# Patient Record
Sex: Female | Born: 1980 | Race: Black or African American | Hispanic: No | Marital: Single | Smoking: Never smoker
Health system: Southern US, Community
[De-identification: ages and names within clinical notes are randomized; demographics above are authoritative.]

## PROBLEM LIST (undated history)

## (undated) DIAGNOSIS — G809 Cerebral palsy, unspecified: Secondary | ICD-10-CM

## (undated) DIAGNOSIS — T420X1A Poisoning by hydantoin derivatives, accidental (unintentional), initial encounter: Secondary | ICD-10-CM

## (undated) DIAGNOSIS — I10 Essential (primary) hypertension: Principal | ICD-10-CM

## (undated) DIAGNOSIS — F79 Unspecified intellectual disabilities: Secondary | ICD-10-CM

## (undated) DIAGNOSIS — H47619 Cortical blindness, unspecified side of brain: Secondary | ICD-10-CM

## (undated) DIAGNOSIS — R569 Unspecified convulsions: Secondary | ICD-10-CM

## (undated) DIAGNOSIS — I509 Heart failure, unspecified: Secondary | ICD-10-CM

## (undated) DIAGNOSIS — J189 Pneumonia, unspecified organism: Secondary | ICD-10-CM

## (undated) DIAGNOSIS — G808 Other cerebral palsy: Secondary | ICD-10-CM

## (undated) DIAGNOSIS — H539 Unspecified visual disturbance: Secondary | ICD-10-CM

## (undated) DIAGNOSIS — J449 Chronic obstructive pulmonary disease, unspecified: Secondary | ICD-10-CM

## (undated) DIAGNOSIS — J309 Allergic rhinitis, unspecified: Secondary | ICD-10-CM

## (undated) HISTORY — DX: Unspecified convulsions: R56.9

## (undated) HISTORY — DX: Unspecified visual disturbance: H53.9

## (undated) HISTORY — DX: Pneumonia, unspecified organism: J18.9

## (undated) HISTORY — DX: Cerebral palsy, unspecified: G80.9

## (undated) HISTORY — PX: PEG TUBE PLACEMENT: SUR1034

## (undated) HISTORY — DX: Other cerebral palsy: G80.8

## (undated) HISTORY — DX: Poisoning by hydantoin derivatives, accidental (unintentional), initial encounter: T42.0X1A

## (undated) HISTORY — DX: Unspecified intellectual disabilities: F79

## (undated) HISTORY — DX: Allergic rhinitis, unspecified: J30.9

## (undated) HISTORY — DX: Essential (primary) hypertension: I10

## (undated) HISTORY — DX: Cortical blindness, unspecified side of brain: H47.619

---
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
## 1999-09-07 ENCOUNTER — Emergency Department (HOSPITAL_COMMUNITY): Admission: EM | Admit: 1999-09-07 | Discharge: 1999-09-07 | Payer: Self-pay | Admitting: Emergency Medicine

## 1999-09-07 ENCOUNTER — Encounter: Payer: Self-pay | Admitting: Emergency Medicine

## 1999-09-08 ENCOUNTER — Encounter: Payer: Self-pay | Admitting: Emergency Medicine

## 1999-09-08 ENCOUNTER — Inpatient Hospital Stay (HOSPITAL_COMMUNITY): Admission: EM | Admit: 1999-09-08 | Discharge: 1999-09-16 | Payer: Self-pay | Admitting: Emergency Medicine

## 1999-09-11 ENCOUNTER — Encounter: Payer: Self-pay | Admitting: Pediatrics

## 1999-09-25 ENCOUNTER — Emergency Department (HOSPITAL_COMMUNITY): Admission: EM | Admit: 1999-09-25 | Discharge: 1999-09-25 | Payer: Self-pay | Admitting: Emergency Medicine

## 1999-09-25 ENCOUNTER — Encounter: Payer: Self-pay | Admitting: Emergency Medicine

## 1999-09-26 ENCOUNTER — Inpatient Hospital Stay (HOSPITAL_COMMUNITY): Admission: AD | Admit: 1999-09-26 | Discharge: 1999-09-28 | Payer: Self-pay | Admitting: Pediatrics

## 1999-09-26 ENCOUNTER — Encounter: Payer: Self-pay | Admitting: Pediatrics

## 1999-10-03 ENCOUNTER — Inpatient Hospital Stay (HOSPITAL_COMMUNITY): Admission: EM | Admit: 1999-10-03 | Discharge: 1999-10-14 | Payer: Self-pay | Admitting: Pediatrics

## 1999-10-04 ENCOUNTER — Encounter: Payer: Self-pay | Admitting: Pediatrics

## 1999-10-06 ENCOUNTER — Encounter: Payer: Self-pay | Admitting: Pediatrics

## 1999-10-07 ENCOUNTER — Encounter: Payer: Self-pay | Admitting: Pediatrics

## 1999-10-09 ENCOUNTER — Encounter: Payer: Self-pay | Admitting: Pediatrics

## 1999-12-26 ENCOUNTER — Ambulatory Visit (HOSPITAL_COMMUNITY): Admission: RE | Admit: 1999-12-26 | Discharge: 1999-12-26 | Payer: Self-pay | Admitting: Surgery

## 2000-01-01 ENCOUNTER — Encounter: Payer: Self-pay | Admitting: Emergency Medicine

## 2000-01-01 ENCOUNTER — Observation Stay (HOSPITAL_COMMUNITY): Admission: EM | Admit: 2000-01-01 | Discharge: 2000-01-02 | Payer: Self-pay | Admitting: Emergency Medicine

## 2000-11-26 ENCOUNTER — Ambulatory Visit (HOSPITAL_COMMUNITY): Admission: RE | Admit: 2000-11-26 | Discharge: 2000-11-26 | Payer: Self-pay | Admitting: Surgery

## 2001-08-03 ENCOUNTER — Ambulatory Visit (HOSPITAL_COMMUNITY): Admission: RE | Admit: 2001-08-03 | Discharge: 2001-08-03 | Payer: Self-pay | Admitting: Surgery

## 2001-12-07 ENCOUNTER — Encounter: Payer: Self-pay | Admitting: Emergency Medicine

## 2001-12-07 ENCOUNTER — Inpatient Hospital Stay (HOSPITAL_COMMUNITY): Admission: EM | Admit: 2001-12-07 | Discharge: 2001-12-12 | Payer: Self-pay | Admitting: Emergency Medicine

## 2001-12-08 ENCOUNTER — Encounter: Payer: Self-pay | Admitting: Emergency Medicine

## 2002-10-16 ENCOUNTER — Emergency Department (HOSPITAL_COMMUNITY): Admission: EM | Admit: 2002-10-16 | Discharge: 2002-10-16 | Payer: Self-pay | Admitting: Emergency Medicine

## 2002-10-24 ENCOUNTER — Emergency Department (HOSPITAL_COMMUNITY): Admission: EM | Admit: 2002-10-24 | Discharge: 2002-10-24 | Payer: Self-pay | Admitting: Emergency Medicine

## 2002-10-24 ENCOUNTER — Encounter: Payer: Self-pay | Admitting: Emergency Medicine

## 2003-02-20 ENCOUNTER — Ambulatory Visit (HOSPITAL_COMMUNITY): Admission: RE | Admit: 2003-02-20 | Discharge: 2003-02-20 | Payer: Self-pay | Admitting: General Surgery

## 2003-05-01 ENCOUNTER — Ambulatory Visit: Admission: RE | Admit: 2003-05-01 | Discharge: 2003-05-01 | Payer: Self-pay | Admitting: Pediatrics

## 2003-05-15 ENCOUNTER — Ambulatory Visit (HOSPITAL_COMMUNITY): Admission: RE | Admit: 2003-05-15 | Discharge: 2003-05-15 | Payer: Self-pay | Admitting: Pediatrics

## 2003-07-18 ENCOUNTER — Inpatient Hospital Stay (HOSPITAL_COMMUNITY): Admission: EM | Admit: 2003-07-18 | Discharge: 2003-07-20 | Payer: Self-pay | Admitting: Emergency Medicine

## 2003-11-05 ENCOUNTER — Emergency Department (HOSPITAL_COMMUNITY): Admission: EM | Admit: 2003-11-05 | Discharge: 2003-11-05 | Payer: Self-pay | Admitting: Emergency Medicine

## 2003-11-06 ENCOUNTER — Inpatient Hospital Stay (HOSPITAL_COMMUNITY): Admission: EM | Admit: 2003-11-06 | Discharge: 2003-11-08 | Payer: Self-pay | Admitting: Emergency Medicine

## 2004-05-14 ENCOUNTER — Emergency Department (HOSPITAL_COMMUNITY): Admission: EM | Admit: 2004-05-14 | Discharge: 2004-05-14 | Payer: Self-pay | Admitting: Emergency Medicine

## 2004-11-24 ENCOUNTER — Emergency Department (HOSPITAL_COMMUNITY): Admission: EM | Admit: 2004-11-24 | Discharge: 2004-11-24 | Payer: Self-pay | Admitting: Family Medicine

## 2005-07-09 ENCOUNTER — Emergency Department (HOSPITAL_COMMUNITY): Admission: EM | Admit: 2005-07-09 | Discharge: 2005-07-09 | Payer: Self-pay | Admitting: Emergency Medicine

## 2005-12-21 ENCOUNTER — Ambulatory Visit: Payer: Self-pay | Admitting: Surgery

## 2005-12-21 ENCOUNTER — Ambulatory Visit (HOSPITAL_COMMUNITY): Admission: RE | Admit: 2005-12-21 | Discharge: 2005-12-21 | Payer: Self-pay | Admitting: Surgery

## 2006-04-01 ENCOUNTER — Ambulatory Visit: Payer: Self-pay | Admitting: Surgery

## 2006-04-12 ENCOUNTER — Encounter: Admission: RE | Admit: 2006-04-12 | Discharge: 2006-04-13 | Payer: Self-pay | Admitting: Pediatrics

## 2007-01-11 ENCOUNTER — Ambulatory Visit: Payer: Self-pay | Admitting: General Surgery

## 2007-05-02 ENCOUNTER — Inpatient Hospital Stay (HOSPITAL_COMMUNITY): Admission: EM | Admit: 2007-05-02 | Discharge: 2007-05-05 | Payer: Self-pay | Admitting: Emergency Medicine

## 2007-08-18 ENCOUNTER — Inpatient Hospital Stay (HOSPITAL_COMMUNITY): Admission: EM | Admit: 2007-08-18 | Discharge: 2007-08-19 | Payer: Self-pay | Admitting: Emergency Medicine

## 2007-08-23 ENCOUNTER — Emergency Department (HOSPITAL_COMMUNITY): Admission: EM | Admit: 2007-08-23 | Discharge: 2007-08-24 | Payer: Self-pay | Admitting: Emergency Medicine

## 2007-08-29 ENCOUNTER — Inpatient Hospital Stay (HOSPITAL_COMMUNITY): Admission: EM | Admit: 2007-08-29 | Discharge: 2007-09-22 | Payer: Self-pay | Admitting: Emergency Medicine

## 2007-08-29 ENCOUNTER — Ambulatory Visit: Payer: Self-pay | Admitting: Pulmonary Disease

## 2007-09-01 ENCOUNTER — Encounter (INDEPENDENT_AMBULATORY_CARE_PROVIDER_SITE_OTHER): Payer: Self-pay | Admitting: Neurology

## 2007-09-01 ENCOUNTER — Ambulatory Visit: Payer: Self-pay | Admitting: Vascular Surgery

## 2007-11-27 ENCOUNTER — Emergency Department (HOSPITAL_COMMUNITY): Admission: EM | Admit: 2007-11-27 | Discharge: 2007-11-27 | Payer: Self-pay | Admitting: Emergency Medicine

## 2007-12-04 ENCOUNTER — Emergency Department (HOSPITAL_COMMUNITY): Admission: EM | Admit: 2007-12-04 | Discharge: 2007-12-04 | Payer: Self-pay | Admitting: Emergency Medicine

## 2007-12-24 ENCOUNTER — Emergency Department (HOSPITAL_COMMUNITY): Admission: EM | Admit: 2007-12-24 | Discharge: 2007-12-25 | Payer: Self-pay | Admitting: Emergency Medicine

## 2008-01-17 ENCOUNTER — Ambulatory Visit: Payer: Self-pay | Admitting: Infectious Diseases

## 2008-01-17 ENCOUNTER — Inpatient Hospital Stay (HOSPITAL_COMMUNITY): Admission: EM | Admit: 2008-01-17 | Discharge: 2008-01-21 | Payer: Self-pay | Admitting: Emergency Medicine

## 2008-01-30 ENCOUNTER — Inpatient Hospital Stay (HOSPITAL_COMMUNITY): Admission: EM | Admit: 2008-01-30 | Discharge: 2008-02-13 | Payer: Self-pay | Admitting: Emergency Medicine

## 2008-02-03 ENCOUNTER — Ambulatory Visit: Payer: Self-pay | Admitting: Gastroenterology

## 2008-02-06 ENCOUNTER — Encounter (INDEPENDENT_AMBULATORY_CARE_PROVIDER_SITE_OTHER): Payer: Self-pay | Admitting: Neurology

## 2008-02-06 ENCOUNTER — Ambulatory Visit: Payer: Self-pay | Admitting: *Deleted

## 2008-02-16 ENCOUNTER — Inpatient Hospital Stay (HOSPITAL_COMMUNITY): Admission: AD | Admit: 2008-02-16 | Discharge: 2008-02-23 | Payer: Self-pay | Admitting: Pediatrics

## 2009-07-18 ENCOUNTER — Encounter: Payer: Self-pay | Admitting: Family Medicine

## 2009-07-18 ENCOUNTER — Ambulatory Visit: Payer: Self-pay | Admitting: Family Medicine

## 2009-07-23 ENCOUNTER — Encounter: Payer: Self-pay | Admitting: Family Medicine

## 2009-07-23 LAB — CONVERTED CEMR LAB
AST: 41 units/L — ABNORMAL HIGH (ref 0–37)
Albumin: 3.9 g/dL (ref 3.5–5.2)
BUN: 11 mg/dL (ref 6–23)
Calcium: 9.1 mg/dL (ref 8.4–10.5)
Chloride: 99 meq/L (ref 96–112)
Potassium: 4.4 meq/L (ref 3.5–5.3)

## 2009-10-08 ENCOUNTER — Ambulatory Visit: Payer: Self-pay | Admitting: Family Medicine

## 2009-10-14 ENCOUNTER — Observation Stay (HOSPITAL_COMMUNITY): Admission: EM | Admit: 2009-10-14 | Discharge: 2009-10-16 | Payer: Self-pay | Admitting: Emergency Medicine

## 2009-10-14 ENCOUNTER — Encounter: Payer: Self-pay | Admitting: Family Medicine

## 2009-10-14 ENCOUNTER — Ambulatory Visit: Payer: Self-pay | Admitting: Family Medicine

## 2009-10-22 ENCOUNTER — Ambulatory Visit: Payer: Self-pay | Admitting: Family Medicine

## 2010-01-01 ENCOUNTER — Emergency Department (HOSPITAL_COMMUNITY): Admission: EM | Admit: 2010-01-01 | Discharge: 2010-01-01 | Payer: Self-pay | Admitting: Family Medicine

## 2010-01-23 ENCOUNTER — Telehealth: Payer: Self-pay | Admitting: *Deleted

## 2010-01-25 ENCOUNTER — Ambulatory Visit: Payer: Self-pay | Admitting: Family Medicine

## 2010-01-25 ENCOUNTER — Inpatient Hospital Stay (HOSPITAL_COMMUNITY): Admission: EM | Admit: 2010-01-25 | Discharge: 2010-01-28 | Payer: Self-pay | Admitting: Emergency Medicine

## 2010-01-25 ENCOUNTER — Encounter: Payer: Self-pay | Admitting: Family Medicine

## 2010-02-21 ENCOUNTER — Ambulatory Visit: Payer: Self-pay | Admitting: Family Medicine

## 2010-02-21 ENCOUNTER — Telehealth: Payer: Self-pay | Admitting: Family Medicine

## 2010-02-21 DIAGNOSIS — B350 Tinea barbae and tinea capitis: Secondary | ICD-10-CM

## 2010-02-21 DIAGNOSIS — G809 Cerebral palsy, unspecified: Secondary | ICD-10-CM

## 2010-02-21 DIAGNOSIS — H543 Unqualified visual loss, both eyes: Secondary | ICD-10-CM | POA: Insufficient documentation

## 2010-02-24 ENCOUNTER — Telehealth: Payer: Self-pay | Admitting: Family Medicine

## 2010-03-23 ENCOUNTER — Emergency Department (HOSPITAL_COMMUNITY): Admission: EM | Admit: 2010-03-23 | Discharge: 2010-03-23 | Payer: Self-pay | Admitting: Family Medicine

## 2010-03-24 ENCOUNTER — Ambulatory Visit: Payer: Self-pay | Admitting: Family Medicine

## 2010-03-24 DIAGNOSIS — B09 Unspecified viral infection characterized by skin and mucous membrane lesions: Secondary | ICD-10-CM | POA: Insufficient documentation

## 2010-03-24 DIAGNOSIS — J069 Acute upper respiratory infection, unspecified: Secondary | ICD-10-CM | POA: Insufficient documentation

## 2010-03-27 ENCOUNTER — Ambulatory Visit: Payer: Self-pay | Admitting: Family Medicine

## 2010-04-01 ENCOUNTER — Ambulatory Visit: Payer: Self-pay | Admitting: Family Medicine

## 2010-04-01 DIAGNOSIS — T148XXA Other injury of unspecified body region, initial encounter: Secondary | ICD-10-CM

## 2010-04-07 ENCOUNTER — Ambulatory Visit: Payer: Self-pay | Admitting: Family Medicine

## 2010-05-19 ENCOUNTER — Ambulatory Visit: Admission: RE | Admit: 2010-05-19 | Discharge: 2010-05-19 | Payer: Self-pay | Source: Home / Self Care

## 2010-05-22 ENCOUNTER — Ambulatory Visit
Admission: RE | Admit: 2010-05-22 | Discharge: 2010-05-22 | Payer: Self-pay | Source: Home / Self Care | Attending: Family Medicine | Admitting: Family Medicine

## 2010-05-22 ENCOUNTER — Telehealth: Payer: Self-pay | Admitting: *Deleted

## 2010-05-22 DIAGNOSIS — R609 Edema, unspecified: Secondary | ICD-10-CM | POA: Insufficient documentation

## 2010-05-23 ENCOUNTER — Telehealth: Payer: Self-pay | Admitting: Family Medicine

## 2010-05-27 NOTE — Assessment & Plan Note (Signed)
Summary: severe congestion/eo   Vital Signs:  Patient profile:   30 year old female Temp:     97.5 degrees F axillary Pulse rate:   89 / minute BP sitting:   137 / 94  (right arm) Cuff size:   regular  Vitals Entered By: Tessie Fass CMA (October 08, 2009 11:02 AM) CC: congestion x few days. Is Patient Diabetic? No Pain Assessment Patient in pain? no        CC:  congestion x few days.Marland Kitchen  History of Present Illness: Megan [name] comes in with her aunt today for productive cough and fever for 3-4 days.  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  She is fed through a G-tube.  Did not measure temp yesterday but felt very warm and they gave her tylenol.  Coughing up white to clear sputum.  Has gotten pneumonia before and got so bad she ended up in the hospital and almost died.  No vomitting.  NO diarrhea.  No nasal congestion or watery eyes.   Habits & Providers  Alcohol-Tobacco-Diet     Passive Smoke Exposure: no  Social History: Passive Smoke Exposure:  no  Physical Exam  General:  comfortable in wheelchair Eyes:  conjunctiva clear Lungs:  difficult exam as patient unable to fully cooperate.  Somewhat decreased throughout.  No discrete crackles or wheezes appreciated.  Heart:  RRR   Impression & Recommendations:  Problem # 1:  COUGH (ICD-786.2) Assessment New  Difficult patient to examine.  She is at risk for pneumonia with her comorbidities and at risk for aspiration even with g-tube.  Given patient is in fragile health and very unlikely to get good, reliable xray will empirically treat a pneumonia with 7 day course of avelox which would cover usual respiratory pathogens and anaerobes in case of aspiration.  Return if worsens or not improved at end of 7 days course.   Orders: [facility]- Est Level  3 (78469)  Complete Medication List: 1)  Valproic Acid 250 Mg/38ml Syrp (Valproate sodium) .Marland KitchenMarland KitchenMarland Kitchen 13ml by mouth q 6 hours disp: qs 39month 2)  Ranitidine  Hcl 15 Mg/ml Syrp (Ranitidine hcl) .Marland Kitchen.. 1 tsp two times a day disp: qs 1 month 3)  Dilantin 125 Mg/55ml Susp (Phenytoin) .... 4ml by mouth three times a day  disp: qs 1 month 4)  Primidone 250 Mg Tabs (Primidone) .... 1/2 tab crushed and put through tube two times a day 5)  Avelox 400 Mg Tabs (Moxifloxacin hcl) .Marland Kitchen.. 1 tab by mouth daily for 7 days Prescriptions: AVELOX 400 MG TABS (MOXIFLOXACIN HCL) 1 tab by mouth daily for 7 days  #7 x 0   Entered and Authorized by:   Ardeen Garland  MD   Signed by:   Ardeen Garland  MD on 10/08/2009   Method used:   Faxed to ...       [facility] (retail)       [address]       [place], [place]  [address]       Ph: [phone]       Fax: [phone]   RxID:   [id number]
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
## 2010-05-27 NOTE — Progress Notes (Signed)
Summary: Triage call  Phone Note Other Incoming Call back at [phone]   Summary of Call: Ms. Megan [name] aunt need her to be seen in the afternoon because she attends school.  She has some scabs  on her scalp that the aunt was treating, but she has run out of the meds and [name] is having pus coming from them.   Her aunt's name is Megan [name].  Would prefer to come in tomorrow afternoon after 3:00. Initial call taken by: Abundio Miu,  January 23, 2010 8:49 AM  Follow-up for Phone Call        Appt made with Dr. Janalyn Harder for Monday pm. Follow-up by: Dennison Nancy RN,  January 23, 2010 10:54 AM
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
## 2010-05-27 NOTE — Assessment & Plan Note (Signed)
Summary: np,df   Vital Signs:  Patient profile:   30 year old female Temp:     97.4 degrees F oral Pulse rate:   93 / minute BP sitting:   110 / 70  (right arm) Cuff size:   regular  Vitals Entered By: Tessie Fass CMA (July 18, 2009 4:05 PM) CC: new pt   CC:  new pt.  History of Present Illness: Megan [name] comes in with her grandmother today to establish care.  Has not really seen a doctor since admitted to the hospital in December 2009. She does have a complicated medical history, updated in [facility].  Primary problems are neurologic, however, and she is otherwise healthy.  No complaints today.  Just want to establish care.  They were following with Dr. Sharene Skeans but have not seen him since that 2009 admission, though they have been getting refills of her seizure medications.  No recent labwork obtained per grandmother.    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  Her mother is not involved in her care.   Grandmother's name is [name].  She attends [employer].   Physical Exam  General:  in wheelchair.  Nonverbal.  Comfortable.  NAD vitals reviewed Eyes:  Strabismus Lungs:  normal WOB, CTAB Heart:  RRR without murmur Abdomen:  PEG in palce.  Site without redness, swelling, or discharge.  Msk:  contractures of extremities Neurologic:  unable to assess - cannot cooperate wheelchair bound   Impression & Recommendations:  Problem # 1:  ENCOUNTER FOR LONG-TERM USE OF OTHER MEDICATIONS (ICD-V58.69) Assessment New Other than seizure disorder and associated comorbidities, patient otherwise healthy.  No HTN, hLD, DM, CAD, etc.  Will check levels of her  antiepileptic meds today.  Grandmother will re-establish with Dr. Sharene Skeans.  If she needs a referral, she will let us know.  Benign exam today.  Orders: Comp Met-[facility] [phone]) Dilantin-[facility] [phone]) Valproic Acid-[facility] [id number])  Complete Medication List: 1)  Valproic Acid 250 Mg/52ml Syrp (Valproate sodium) .Marland KitchenMarland KitchenMarland Kitchen 13ml by mouth q 6 hours disp: qs 66month 2)  Ranitidine Hcl 15 Mg/ml Syrp (Ranitidine hcl) .Marland Kitchen.. 1 tsp two times a day disp: qs 1 month 3)  Dilantin 125 Mg/36ml Susp (Phenytoin) .... 4ml by mouth three times a day  disp: qs 1 month 4)  Primidone 250 Mg Tabs (Primidone) .... 1/2 tab crushed and put through tube two times a day

## 2010-05-27 NOTE — Miscellaneous (Signed)
Summary: Orders Update: Future lab orders  Clinical Lists Changes  Orders: Added new Test order of CBC-[facility] ([id number]) - Signed Added new Test order of Comp Met-[facility] [phone]) - Signed Added new Test order of Dilantin-[facility] [phone]) - Signed Added new Test order of Ammonia-[facility] [phone]) - Signed Added new Test order of Valproic Acid-[facility] ([id number]) - Signed

## 2010-05-27 NOTE — Progress Notes (Signed)
Summary: Lamisil dose verification  [facility] pharmacy called questioning the dosage Dr. Fara Boros prescribed on pts Lamisil.  Paged to Dr. Fara Boros. Per Dr. Fara Boros change Rx for Lamisil 250mg . Crush one tablet in G-tube one time a day.  Dispense #75 with no refills.  [facility] pharmacy notified [phone].  Terese Door  February 24, 2010 11:49 AM

## 2010-05-27 NOTE — Assessment & Plan Note (Signed)
Summary: not any better,df   Vital Signs:  Patient profile:   30 year old female O2 Sat:      95 % Temp:     97.9 degrees F oral Pulse rate:   80 / minute BP sitting:   110 / 80  (right arm)  Vitals Entered By: Arlyss Repress CMA, (March 27, 2010 10:52 AM) CC: f/up cough and congestion. not any better. worse during night. Is Patient Diabetic? No Pain Assessment Patient in pain? no        Primary Care Provider:  Demetria Pore MD  CC:  f/up cough and congestion. not any better. worse during night..  History of Present Illness: Patient with Cerebral Palsy and cognitive impairment, cared for by aunt Leanne who is the historian today. Tamani was seen here last on 11/28 for cough which has been present since her discharge from the hospital in early October.  Review of E-chart and CT angio of chest done on Jan 25, 2010 shows R lower lung PNA, also seen on CXR of same date.  No PE seen; CT abd/pelvis showed stool only.   Since last visit, Ivis continues with excessive cough.  Leanne notes that Shirleyann's habitus makes it difficult for her to generate the pressure needed to clear secretions.  Coughs more at night; has had some secretions which are clear.  No fevers since last visit, although had 104F at time of last admission.  Completed the Avelox then, did seem better at home after discharge.   No known allergies to meds.  Kacy's grandmother (Leanne's mother) lives with the family, has a bacterial sinusitis and was just started on abx for this.    Habits & Providers  Alcohol-Tobacco-Diet     Passive Smoke Exposure: no  Current Medications (verified): 1)  Valproic Acid 250 Mg/19ml Syrp (Valproate Sodium) .Marland KitchenMarland KitchenMarland Kitchen 13ml By Mouth Q 6 Hours Disp: Qs 10month 2)  Ranitidine Hcl 15 Mg/ml Syrp (Ranitidine Hcl) .Marland Kitchen.. 1 Tsp Two Times A Day Disp: Qs 1 Month 3)  Dilantin 125 Mg/43ml Susp (Phenytoin) .... 4ml By Mouth Three Times A Day  Disp: Qs 1 Month 4)  Primidone 250 Mg Tabs (Primidone) .... 1/2 Tab  Crushed and Put Through Tube Two Times A Day 5)  Terbinafine Hcl 250 Mg Tabs (Terbinafine Hcl) .... Crush 1 Tab and Give Per G-Tube Daily For 4 Weeks After All Lesions Have Cleared 6)  Doxycycline Hyclate 100 Mg Caps (Doxycycline Hyclate) .... Sig Give Contents of 1 Capsule in Peg Tube, Twice Daily For 10 Days  Allergies (verified): No Known Drug Allergies  Physical Exam  General:  Wheelchair bound; appears comfortable.  Sleeping, not easily arousable.  No accessory muscle use with breathing. Ears:  TMs viewed, clear bilaterally Nose:  No rhinorrhea noted Mouth:  Moist mucus membranes Neck:  Neck supple.  Lungs:  Decreased respiratory effort. Lungs are clear to auscultation, no crackles or wheezes.  Heart:  Normal rate and regular rhythm. S1 and S2 normal without gallop, murmur, click, rub or other extra sounds.   Impression & Recommendations:  Problem # 1:  COUGH (ICD-786.2)  Patient with recent admission for pneumonia (Oct 2011); appeared improved, now back with persistent and nonresolving cough that has been plateaued for a few weeks.  Patient has risk of poor mobility and inability to generate valsalva needed to clear secretions.  No fevers, Pulse Ox 95% and appears comfortable now.  Will give empiric treatment with doxy 100mg  capsules, contents of capsule per G tube  twice daily, for 10 days.  For repeat visit in the coming week, or sooner if worsens/fever.  Would consider followup CXR if not improving, certainly if worsening.   Orders: [facility]- Est Level  3 ([address])  Complete Medication List: 1)  Valproic Acid 250 Mg/85ml Syrp (Valproate sodium) .Marland KitchenMarland KitchenMarland Kitchen 13ml by mouth q 6 hours disp: qs 37month 2)  Ranitidine Hcl 15 Mg/ml Syrp (Ranitidine hcl) .Marland Kitchen.. 1 tsp two times a day disp: qs 1 month 3)  Dilantin 125 Mg/44ml Susp (Phenytoin) .... 4ml by mouth three times a day  disp: qs 1 month 4)  Primidone 250 Mg Tabs (Primidone) .... 1/2 tab crushed and put through tube two times a day 5)  Terbinafine  Hcl 250 Mg Tabs (Terbinafine hcl) .... Crush 1 tab and give per g-tube daily for 4 weeks after all lesions have cleared 6)  Doxycycline Hyclate 100 Mg Caps (Doxycycline hyclate) .... Sig give contents of 1 capsule in peg tube, twice daily for 10 days  Patient Instructions: 1)  It was a pleasure to see you today.  I am prescribing another antibiotic that covers pneumonia for Sheridan.  The prescription for doxycycline 100mg  capsules was sent to [facility].  Please give her 1 capsule by PEG tube twice daily for 10 days.  2)  Iwould encourage you to continue Mucinex to loosen secretions.  3)  I would like her to follow up here next week to see how she is doing.  Please call sooner if she develops new fevers, or appearsto be having trouble breathing. Prescriptions: DOXYCYCLINE HYCLATE 100 MG CAPS (DOXYCYCLINE HYCLATE) SIG Give contents of 1 capsule in PEG tube, twice daily for 10 days  #20 x 0   Entered and Authorized by:   Paula Compton MD   Signed by:   Paula Compton MD on 03/27/2010   Method used:   Faxed to ...       [facility] (retail)       [address]       [place], [place]  [address]       Ph: [phone]       Fax: [phone]   RxID:   [id number]    Orders Added: 1)  [facility]- Est Level  3 [[address]]

## 2010-05-27 NOTE — Progress Notes (Signed)
  Phone Note Outgoing Call   Call placed by: Milinda Antis MD,  February 21, 2010 6:04 PM Details for Reason: Script Summary of Call: Called in Terbinafine script for 30 day supply

## 2010-05-27 NOTE — Letter (Signed)
Summary: Generic Letter  [facility]  [address]   [place], [place] [address]   Phone: [phone]  Fax: [phone]    07/23/2009  ARCELIA PALS [address] [place], [place]  [address]  Dear Ms. KRUKOWSKI,  Your recent labwork was good.  Your dilantin level is perfect.  Your valproic acid level is slightly high, but your other labwork is good and since you have been well-controlled on this dose, we do not need to change it at this time.  However, please return in 6 weeks to have the level checked again, to make sure it isn't continuing to rise.  If you have any questions, please call our office.  Please call to schedule a lab appointment sometime in May to check the levels again.  You do not have to make an appointment with me, just the lab.  If you are having questions or problems, feel free to make an appointment with me.    If you haven't scheduled an appointment with Dr. Sharene Skeans please do so.  If you need a referral from [place], just let us know.    Sincerely,   Ardeen Garland  MD  Appended Document: Generic Letter mailed.

## 2010-05-27 NOTE — Assessment & Plan Note (Signed)
Summary: blisters,df   Vital Signs:  Patient profile:   30 year old female Pulse rate:   94 / minute BP sitting:   127 / 94  (left arm) Cuff size:   regular  Vitals Entered By: Jimmy Footman, CMA (April 01, 2010 4:12 PM) CC: blisters on hands, and under chin x2 days. swelling of hands   Primary Care Provider:  Demetria Pore MD  CC:  blisters on hands and and under chin x2 days. swelling of hands.  History of Present Illness: 1) Blisters: x 2 days. Located on chin, chest and hands. Also reports hand swelling for about past two days as well. Question of oral lesions (on tongue) as well. Treated with doxycyline (seen in clinic on 03/27/10) empirically for worsening cough with concern for PNA given prior history of right lower lobe PNA In October 2011.   Denies fevers, emesis, behavior change, trauma, sick contacts, new medications except for doxycycline    Med rec as per prior meds       Medications Prior to Update: 1)  Valproic Acid 250 Mg/21ml Syrp (Valproate Sodium) .Marland KitchenMarland KitchenMarland Kitchen 13ml By Mouth Q 6 Hours Disp: Qs 5month 2)  Ranitidine Hcl 15 Mg/ml Syrp (Ranitidine Hcl) .Marland Kitchen.. 1 Tsp Two Times A Day Disp: Qs 1 Month 3)  Dilantin 125 Mg/67ml Susp (Phenytoin) .... 4ml By Mouth Three Times A Day  Disp: Qs 1 Month 4)  Primidone 250 Mg Tabs (Primidone) .... 1/2 Tab Crushed and Put Through Tube Two Times A Day 5)  Terbinafine Hcl 250 Mg Tabs (Terbinafine Hcl) .... Crush 1 Tab and Give Per G-Tube Daily For 4 Weeks After All Lesions Have Cleared 6)  Doxycycline Hyclate 100 Mg Caps (Doxycycline Hyclate) .... 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  NAD  Eyes:  no conjunctivitis  Mouth:  Moist mucus membranes, copious secretions, no oral blistering noted, no lesions on tongue  Neck:  no lymphadenopathy   Lungs:  Decreased respiratory effort. Lungs are clear to auscultation, no crackles or wheezes.  Extremities:  bilateral hand edema noted; no leg edema  Skin:  several bullae / blisters w/ clear fluid 2 cm x 1 cm at largest - one on chin, one on upper right chest, one at left thenar eminence, one on right leg    Impression & Recommendations:  Problem # 1:  BLISTERS (ICD-919.2) Assessment New  Possibly secondary to doxycyline - w/ blistering and (angioneurotic) edema - concern for early Stevens-Johnson Syndrome. Discussed with Dr. Leveda Anna and with family - decision was made to stop doxycycline at this time, but to hold off on treatement of possible SJS. Reviewed red flag symptoms including worsening blisters, oral lesions, fevers, worsening swelling (including facial / airway swelling). Patient's caretakers expressed understanding. Will add doxycycline to medication list. Will hold off on further antibiotics for now (doxycycline was started empirically).   Orders: [facility]- Est Level  3 ([address])  Complete Medication List: 1)  Valproic Acid 250 Mg/57ml Syrp (Valproate sodium) .Marland KitchenMarland KitchenMarland Kitchen 13ml by mouth q 6 hours disp: qs 5month 2)  Ranitidine Hcl 15 Mg/ml Syrp (Ranitidine hcl) .Marland Kitchen.. 1 tsp two times a day disp: qs 1 month 3)  Dilantin 125  Mg/25ml Susp (Phenytoin) .... 4ml by mouth three times a day  disp: qs 1 month 4)  Primidone 250 Mg Tabs (Primidone) .... 1/2 tab crushed and put through tube two times a day 5)  Terbinafine Hcl 250 Mg Tabs (Terbinafine hcl) .... Crush 1 tab and give per g-tube daily for 4 weeks after all lesions have cleared 6)  Doxycycline Hyclate 100 Mg Caps (Doxycycline hyclate) .... Sig give contents of 1 capsule in peg tube, twice daily for 10 days   Orders Added: 1)  [facility]- Est Level  3  [[address]]  Appended Document: blisters,df    Clinical Lists Changes  Allergies: Added new allergy or adverse reaction of DOXYCYCLINE Observations: Added new observation of NKA: F (04/01/2010 17:41)
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
## 2010-05-27 NOTE — Assessment & Plan Note (Signed)
Summary: h/fup,tcb   Vital Signs:  Patient profile:   30 year old female Temp:     97.5 degrees F oral Pulse rate:   101 / minute BP sitting:   130 / 95  Vitals Entered By: Tessie Fass CMA (February 21, 2010 3:36 PM) CC: hospital f/u pneumonia   Primary Provider:  Demetria Pore MD  CC:  hospital f/u pneumonia.  History of Present Illness: Pt here for f/u after being hospitalized for pneumonia.  Pt's caregiver, grandmother, thinks that she has recovered completely from this with the exception of a small cough that she still has.  She is acting like herself. No fevers. Finished antibiotics.  Has no concerns about her lungs or breathing, however, is concerned with a rash on the back of her head, which she has had since September.  Was given ketoconazole shampoo at UC, but does not think that it is helping.  It maybe helped a little when she first started using it, but grandmother thinks that Evanee's head looks worse now.  Does not appear to hurt her, but does think that it itches. Lots of little bumps that scab.  Sometimes has clear-ish discharge. No pus. Appear to be spreading.  No other rashes on body. Has never had anything like this before. No one else at home has anything similar.  Had before she started antibiotics in hospital.   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 1 Tsp Two Times A Day Disp: Qs 1 Month 3)  Dilantin 125 Mg/32ml Susp (Phenytoin) .... 4ml By Mouth Three Times A Day  Disp: Qs 1 Month 4)  Primidone 250 Mg Tabs (Primidone) .... 1/2 Tab Crushed and Put Through Tube Two Times A Day  Allergies (verified): No Known Drug Allergies  Physical Exam  General:  well-hydrated.    Head:  small 1x1cm vesicles/pustules with some scabbing/ scaling over  ~30% of head, most prominent right posterior occipital area. large confluence of vesicles over mid-occipital area. no active weeping or pus.  Lungs:  Normal respiratory effort, chest expands symmetrically. Lungs are clear to auscultation, no crackles or wheezes. Heart:  Normal rate and regular rhythm. S1 and S2 normal without gallop, murmur, click, rub or other extra sounds.   Impression & Recommendations:  Problem # 1:  DERMATOPHYTOSIS OF SCALP AND BEARD (ICD-110.0) Assessment Deteriorated  Present since September. Assessed lesions with Dr. Deirdre Priest. Appears to be fungal infection/ kerion which has been partially treated by ketoconazole shampoo given to pt by Urgent Care. Does not appear to have overlaying bacterial infection.  Will give by mouth lamisil to help clear infection from hair follicles/roots.  Informed pt's grandmother to continue giving medicine until 4 weeks after all lesions have cleared in order to be certain we have treated the infection entirely.   Orders: [facility]- Est Level  3 ([address])  Problem # 2:  PNEUMONIA (ICD-486) Assessment: Improved  Lungs CTA-B, no wheezes, no increased respiratory effort, antibiotic course completed. No fevers. Appears to be resolved.   The following medications were removed from the medication list:    Avelox 400 Mg Tabs (Moxifloxacin hcl) .Marland Kitchen... 1 tab by mouth daily for 7 days  Orders: [facility]- Est Level  3 ([address])  Medications Added to Medication List This Visit: 1)  Terbinafine Hcl 250 Mg Tabs (Terbinafine hcl) .... Cursh 2 tabs and give per g-tube two times a day for 4 weeks after all lesions have cleared 2)  Terbinafine Hcl 250 Mg Tabs (Terbinafine hcl) .... Crush 1 tab and give per g-tube daily for 4 weeks after all lesions have cleared  Complete Medication List: 1)  Valproic Acid 250 Mg/64ml Syrp (Valproate sodium) .Marland KitchenMarland KitchenMarland Kitchen 13ml by mouth q 6 hours disp: qs 83month 2)   Ranitidine Hcl 15 Mg/ml Syrp (Ranitidine hcl) .Marland Kitchen.. 1 tsp two times a day disp: qs 1 month 3)  Dilantin 125 Mg/76ml Susp (Phenytoin) .... 4ml by mouth three times a day  disp: qs 1 month 4)  Primidone 250 Mg Tabs (Primidone) .... 1/2 tab crushed and put through tube two times a day 5)  Terbinafine Hcl 250 Mg Tabs (Terbinafine hcl) .... Crush 1 tab and give per g-tube daily for 4 weeks after all lesions have cleared  Patient Instructions: 1)  It was great meeting you! 2)  I'm so glad that Aubrie is doing much better since she has come home from the hospital.  She looks great! 3)  The area on the back of her head is called a kerion. This is from a fungal infection that gets into her hair roots.  I am giving you a medicine for her to take by mouth for it. Please continue giving her this medicine for 4 WEEKS after ALL of the lesions are gone in order to make sure we treat the whole infection. 4)  You can continue using the shampoo if you would like-- it might help with some of the itchiness. 5)  Come back in if it does not seem like the medicine has helped at all in the next 3-4 weeks.  It takes a little bit of time to start working, but it should be helping by then. Prescriptions: TERBINAFINE HCL 250 MG TABS (TERBINAFINE HCL) Crush 1 tab and give per g-tube daily for 4 weeks AFTER all lesions have cleared  #75 x 0   Entered and Authorized by:   Demetria Pore MD   Signed by:   Demetria Pore MD on 02/24/2010   Method used:   Telephoned to ...       [facility] (retail)       [address]       [place], [place]  [address]       Ph: [phone]       Fax: [phone]   RxID:   [id number] TERBINAFINE HCL 250 MG TABS (TERBINAFINE HCL) Cursh 2 tabs and give per g-tube two times a day for 4 weeks AFTER all lesions have cleared  #120 x 0   Entered and Authorized by:   Milinda Antis MD   Signed by:   Milinda Antis MD on 02/21/2010   Method used:   Telephoned to  ...       [facility] (retail)       [address]       [place], [place]  [address]       Ph: [phone]       Fax: [phone]   RxID:   [id number] TERBINAFINE HCL 250 MG TABS (TERBINAFINE HCL) Cursh 2 tabs and give per g-tube two times a day for 4 weeks AFTER all lesions have cleared  #  180 x 0   Entered and Authorized by:   Demetria Pore MD   Signed by:   Demetria Pore MD on 02/21/2010   Method used:   Print then Give to Patient   RxID:   [id number]    Orders Added: 1)  [facility]- Est Level  3 [[address]]

## 2010-05-29 NOTE — Progress Notes (Signed)
Summary: Triage  Phone Note Call from Patient Call back at Home Phone [phone]   Caller: Lynn/Aunt Reason for Call: Talk to Nurse Summary of Call: wants to speak with RN about pt swelling, was just seen for this & would like to get pt lasix. Initial call taken by: Knox Royalty,  May 22, 2010 9:02 AM  Follow-up for Phone Call        Pt has CP and is immobile.  Was seen on 1/23 for a blister to her right heel.  Aunt now concerned that she has swelling in her feet and hands and is concerned that if not addressed it will effect her breathing.  Told her that we could not address edema and possible diuretic w/o seeing her (pt was on lasix a while ago for same problem).  Aunt very willing to bring her in.  Gave her a [place] appt for this afternoon. Follow-up by: Dennison Nancy RN,  May 22, 2010 9:59 AM

## 2010-05-29 NOTE — Progress Notes (Signed)
Summary: Phn Msg  Phone Note Call from Patient Call back at Home Phone [phone]   Caller: Larita Fife Reason for Call: Talk to Doctor Summary of Call: took pt to get blood work done but they couldnt get her blood.  Initial call taken by: Knox Royalty,  May 23, 2010 9:01 AM  Follow-up for Phone Call        Called pt's mom back.  Lab was not able to get blood yesterday and pt still has edema in hands and feet.  Otherwise she is ok.  Looking at chart her renal function has always been wnl.  Will give lasix 20mg  x 3 days, I wrote for 5 days.  Instructed mom to give pt lasix for 3 days and if still swollen can give for total of 5 days.  Mom voiced understanding.  Follow-up by: Trezure Cronk MD,  May 23, 2010 12:41 PM    New/Updated Medications: FUROSEMIDE 20 MG TABS (FUROSEMIDE) 1 tab by mouth daily x 5 days Prescriptions: FUROSEMIDE 20 MG TABS (FUROSEMIDE) 1 tab by mouth daily x 5 days  #5 x 0   Entered and Authorized by:   Angeline Slim MD   Signed by:   Angeline Slim MD on 05/23/2010   Method used:   Electronically to        [facility] (retail)       [address]       [place], [place]  [address]       Ph: [phone]       Fax: [phone]   RxID:   [id number]
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
## 2010-05-29 NOTE — Assessment & Plan Note (Signed)
Summary: Edema in feet and hands/kf   Primary Care Provider:  Demetria Pore MD  CC:  extremety edema.  History of Present Illness: 29 y/o F with MRCP is brought by mother and sister to clinic for hand and feet edema that started yesterday.  Pt was seen in clinic this Monday (3 days ago) for blister on heel of R foot.  Mom states that there has been no new changes to medicine or diet.  Pt has not seem more distressed than normal.  Mom denies any breathing issues, no dyspnea.  She did not void yesterday but voided at daycare today.  Mom denies any fever, cough, vomiting, diarrhea, syncope.  Pt is fed through Jevity feeding tube.  She also gets juice and water regulary.  There has been no change to her routine.  Mom states that a few years ago pt was treated in the hospital for PNA and developed extremety edema similar to today that required a few days of lasix to relieve edema.  Mom denies pt having a history of heart failure.  Current Medications (verified): 1)  Valproic Acid 250 Mg/82ml Syrp (Valproate Sodium) .Marland KitchenMarland KitchenMarland Kitchen 13ml By Mouth Q 6 Hours Disp: Qs 29month 2)  Ranitidine Hcl 15 Mg/ml Syrp (Ranitidine Hcl) .Marland Kitchen.. 1 Tsp Two Times A Day Disp: Qs 1 Month 3)  Dilantin 125 Mg/29ml Susp (Phenytoin) .... 4ml By Mouth Three Times A Day  Disp: Qs 1 Month 4)  Primidone 250 Mg Tabs (Primidone) .... 1/2 Tab Crushed and Put Through Tube Two Times A Day  Allergies (verified): 1)  ! Doxycycline  Review of Systems General:  Denies chills, fever, loss of appetite, and weakness. CV:  Complains of swelling of feet and swelling of hands; denies fainting and weight gain. Resp:  Denies cough and wheezing. GI:  Denies change in bowel habits and vomiting.  Physical Exam  General:  Pt in wheel chair.  No distress.  well nourished.  comfortable Lungs:  Normal respiratory effort, chest expands symmetrically. Lungs are clear to auscultation, no crackles or wheezes. Heart:  Normal rate and regular rhythm. S1 and S2  normal without gallop, murmur, click, rub or other extra sounds. Abdomen:  Feeding tube in place.  No distention.  No erythema, edema.  Msk:  Upper and lower extremeties with decreased tone  Pulses:  Bilateral radial and DP pulses present and equal Extremities:  Bilateral hands and feet with nonpitting edema.  No calf swelling, redness.  Skin is cool to touch.  Nontender.     Impression & Recommendations:  Problem # 1:  EDEMA (ICD-782.3) Assessment New The source of bilateral upper and lower extremety edema is acute and unclear.  Pt without a history of CHF.  She has a remote history of similar event years ago after being treated for PNA.  Pt has not had any new meds or foods or change to routine.  Pt appears comfortable and not in distress.  Her lungs are clear on exam.  Will check renal function today.  Will call fammily tomorrow.  If swelling still present and renal function wnl, I may start pt on low dose lasix for the weekend.  Mom is agreeable to plan.   Complete Medication List: 1)  Valproic Acid 250 Mg/69ml Syrp (Valproate sodium) .Marland KitchenMarland KitchenMarland Kitchen 13ml by mouth q 6 hours disp: qs 29month 2)  Ranitidine Hcl 15 Mg/ml Syrp (Ranitidine hcl) .Marland Kitchen.. 1 tsp two times a day disp: qs 1 month 3)  Dilantin 125 Mg/29ml Susp (Phenytoin) .... 4ml  by mouth three times a day  disp: qs 1 month 4)  Primidone 250 Mg Tabs (Primidone) .... 1/2 tab crushed and put through tube two times a day  Other Orders: [facility]- Est Level  3 ([address])   Orders Added: 1)  [facility]- Est Level  3 [13086]

## 2010-05-29 NOTE — Assessment & Plan Note (Signed)
Summary: f/u [facility]   Vital Signs:  Patient profile:   30 year old female Temp:     97.8 degrees F axillary Pulse rate:   96 / minute Pulse rhythm:   regular BP sitting:   107 / 75  (left arm) Cuff size:   regular  Vitals Entered By: Loralee Pacas CMA (April 07, 2010 9:05 AM) CC: follow-up visit   Primary Provider:  Demetria Pore MD  CC:  follow-up visit.  History of Present Illness: Per pt's family, she has been doing much better.  No new blisters or lesions.  The 2 that she had during her last visit have "popped" and are starting to look better.  They have been putting neosporin or vaseline on the wounds to help them heal.  They do not seem to be bothering her.  No lesions in her mouth or around her lips.  Still has some congestion, but it is also improving.  Kerion on the back of her head has greatly improved.   Current Problems (verified): 1)  Blisters  (ICD-919.2) 2)  Cough  (ICD-786.2) 3)  Unspecified Viral Exanthem  (ICD-057.9) 4)  Viral Uri  (ICD-465.9) 5)  Blindness, Bilateral  (ICD-369.00) 6)  Cerebral Palsy  (ICD-343.9)  Current Medications (verified): 1)  Valproic Acid 250 Mg/58ml Syrp (Valproate Sodium) .Marland KitchenMarland KitchenMarland Kitchen 13ml By Mouth Q 6 Hours Disp: Qs 32month 2)  Ranitidine Hcl 15 Mg/ml Syrp (Ranitidine Hcl) .Marland Kitchen.. 1 Tsp Two Times A Day Disp: Qs 1 Month 3)  Dilantin 125 Mg/4ml Susp (Phenytoin) .... 4ml By Mouth Three Times A Day  Disp: Qs 1 Month 4)  Primidone 250 Mg Tabs (Primidone) .... 1/2 Tab Crushed and Put Through Tube Two Times A Day 5)  Terbinafine Hcl 250 Mg Tabs (Terbinafine Hcl) .... Crush 1 Tab and Give Per G-Tube Daily For 4 Weeks After All Lesions Have Cleared  Allergies (verified): 1)  ! Doxycycline  Physical Exam  General:  well-nourished and well-hydrated.   Head:  Large patches where kerions had previously been have greatly improved; minimal scaling, new hair formation, no erythema.  Mouth:  pharynx pink and moist, no erythema, no exudates, no lesions,  and fair dentition.   Lungs:  no accessory muscle use, no crackles, and no wheezes. difficult to hear since pt is not able to inspire/expire deeply, but no focal deficits or decreased breath sounds.  Heart:  normal rate and regular rhythm.   Skin:  Two well-healing, well-demarcated 2x1cm lesions (one on neck under chin, the other over right clavicle)- pink, clean and dry, no surrounding erythema or induration   Impression & Recommendations:  Problem # 1:  BLISTERS (ICD-919.2) No new blisters or lesions.  Blisters present are well healing, do not appear infected.  Possibly caused by doxy.  Will keep this on her allergy list to avoid any future problems. Orders: [facility]- Est Level  3 ([address])  Problem # 2:  DERMATOPHYTOSIS OF SCALP AND BEARD (ICD-110.0) Assessment: Improved  Has been improving since starting terbinafine.  Encouraged family to continue using for an entire 4 weeks since clearing, but appears much better.  Orders: [facility]- Est Level  3 ([address])  Complete Medication List: 1)  Valproic Acid 250 Mg/82ml Syrp (Valproate sodium) .Marland KitchenMarland KitchenMarland Kitchen 13ml by mouth q 6 hours disp: qs 32month 2)  Ranitidine Hcl 15 Mg/ml Syrp (Ranitidine hcl) .Marland Kitchen.. 1 tsp two times a day disp: qs 1 month 3)  Dilantin 125 Mg/58ml Susp (Phenytoin) .... 4ml by mouth three times a day  disp:  qs 1 month 4)  Primidone 250 Mg Tabs (Primidone) .... 1/2 tab crushed and put through tube two times a day 5)  Terbinafine Hcl 250 Mg Tabs (Terbinafine hcl) .... Crush 1 tab and give per g-tube daily for 4 weeks after all lesions have cleared  Patient Instructions: 1)  It was great seeing you today! 2)  Keep putting the vaseline on the blisters until they clear up. 3)  Please come back if you notice new blisters forming. 4)  Come back to see [place] in 6 months, sooner if needed! Prescriptions: RANITIDINE HCL 15 MG/ML SYRP (RANITIDINE HCL) 1 tsp two times a day disp: QS 1 month  #90 x 3   Entered and Authorized by:   Demetria Pore MD   Signed  by:   Demetria Pore MD on 04/07/2010   Method used:   Printed then faxed to ...       [facility] (retail)       [address]       [place], [place]  [address]       Ph: [phone]       Fax: [phone]   RxID:   [id number]    Orders Added: 1)  [facility]- Est Level  3 [[address]]

## 2010-06-06 ENCOUNTER — Ambulatory Visit (INDEPENDENT_AMBULATORY_CARE_PROVIDER_SITE_OTHER): Payer: Medicaid Other | Admitting: Family Medicine

## 2010-06-06 ENCOUNTER — Encounter: Payer: Self-pay | Admitting: Family Medicine

## 2010-06-06 VITALS — BP 153/114 | HR 90 | Temp 98.7°F

## 2010-06-06 DIAGNOSIS — G809 Cerebral palsy, unspecified: Secondary | ICD-10-CM

## 2010-06-06 DIAGNOSIS — R569 Unspecified convulsions: Secondary | ICD-10-CM

## 2010-06-06 DIAGNOSIS — J069 Acute upper respiratory infection, unspecified: Secondary | ICD-10-CM

## 2010-06-06 NOTE — Patient Instructions (Addendum)
I'm so sorry Olivene is still having this congestion and cough. You can try increasing the Mucinex to 15mL twice per day.  In addition, you can try using Afrin nasal spray (it's over the counter) for the next 3-5 days.  Hopefully this will help!  Keep using the bulb and trying to get some of the mucus out.  You could try using just a small amount of saline when you do it to help loosen some of the mucus up. Try to give her a little extra fluid through her g-tube to keep her hydrated! I will fill out this form.  The office will call you next week when it is completed. Please bring Gloristine back or go to the ED if she starts having fevers that do not improve with medication, starts using her neck or stomach muscles to breath, or just starts looking worse. Come back and see me in 3-4 weeks so we can reassess how she is.  We could consider adding an allergy medicine at that time.  And, if she is still having problems, we will likely get a chest xray just to make sure everything in her lungs looks ok.

## 2010-06-06 NOTE — Assessment & Plan Note (Signed)
Appears to still be viral URI symptoms. No fevers, no focal pulm findings concerning for pneumonia, TMs nml, breathing comfortable and unlabored, O2 sat 97% on RA, VS WNL.  -continue mucinex bid -will try afrin/OTC nasal steroid to try to help dry out sinuses; also suggested nasal saline rinses if pt will tolerate since it will likely be helpful to remove congestion -given red flags to return for including fever and increased WOB, otherwise will return in 4 weeks -if no improvement, will consider CXR and starting antihistamine/allergy medication; could consider Rx for flonase if afrin seems to have some efficacy (unsure how well it will work since pt can't breath in during sprays)

## 2010-06-06 NOTE — Progress Notes (Signed)
  Subjective:    Patient ID: Megan [name], female    DOB: 11-Jan-1981, 30 y.o.   MRN: [id number]  HPI Comments: Increasingly worsening congestion/rhinorrhea. Mucinex does not seem to be helping. Has been going on for at least a few weeks, although looking back at her chart she has had multiple visits for URI/congestion since she was d/c'ed from hospital in 01/2010 s/p PNA. Is having cough, ?increased WOB vs noisy breathing. No wheezing, no GI symptoms. Was "warm" one night last week-- did not take temperature, did not need to give meds.   URI  This is a chronic problem. The current episode started more than 1 month ago. The problem has been waxing and waning. The maximum temperature recorded prior to her arrival was 100 - 100.9 F. The fever has been present for less than 1 day. Associated symptoms include congestion, coughing, rhinorrhea and sneezing. Pertinent negatives include no diarrhea, rash, vomiting or wheezing. She has tried decongestant for the symptoms. The treatment provided no relief.      Review of Systems  Constitutional: Negative for fever.  HENT: Positive for congestion, rhinorrhea and sneezing. Negative for nosebleeds and facial swelling.   Eyes: Negative for discharge.  Respiratory: Positive for cough. Negative for wheezing.   Cardiovascular: Negative for leg swelling.  Gastrointestinal: Negative for vomiting and diarrhea.  Skin: Negative for rash.  Neurological: Negative for seizures.       Objective:   Physical Exam  Vitals reviewed. Constitutional: She appears well-developed and well-nourished.  HENT:  Head: Normocephalic and atraumatic.  Right Ear: Tympanic membrane and external ear normal.  Left Ear: Tympanic membrane and external ear normal.  Nose: Rhinorrhea present. No mucosal edema. No epistaxis.  Mouth/Throat: Oropharynx is clear and moist.  Eyes: Conjunctivae are normal. Pupils are equal, round, and reactive to light. Right eye exhibits no discharge. Left  eye exhibits no discharge.  Neck: Neck supple.  Cardiovascular: Normal rate and regular rhythm.   Pulmonary/Chest: Effort normal. No accessory muscle usage. Not tachypneic. No respiratory distress. She has no decreased breath sounds. She has no wheezes. She has no rhonchi. She has no rales.       Upper airway transmitted noises/congestion. No decreased breath sounds or focal deficits appreciated.  Abdominal: Soft. Bowel sounds are normal.  Musculoskeletal: She exhibits no edema.  Lymphadenopathy:    She has no cervical adenopathy.  Skin: Skin is warm and dry. No rash noted.          Assessment & Plan:  See problem list.

## 2010-06-08 ENCOUNTER — Encounter: Payer: Self-pay | Admitting: Family Medicine

## 2010-06-11 ENCOUNTER — Ambulatory Visit (INDEPENDENT_AMBULATORY_CARE_PROVIDER_SITE_OTHER): Payer: Medicaid Other | Admitting: Family Medicine

## 2010-06-11 ENCOUNTER — Encounter: Payer: Self-pay | Admitting: Family Medicine

## 2010-06-11 VITALS — BP 117/88 | HR 102 | Temp 97.5°F | Wt 95.0 lb

## 2010-06-11 DIAGNOSIS — G809 Cerebral palsy, unspecified: Secondary | ICD-10-CM

## 2010-06-11 DIAGNOSIS — T17910A Gastric contents in respiratory tract, part unspecified causing asphyxiation, initial encounter: Secondary | ICD-10-CM | POA: Insufficient documentation

## 2010-06-11 DIAGNOSIS — R05 Cough: Secondary | ICD-10-CM

## 2010-06-11 DIAGNOSIS — T17308A Unspecified foreign body in larynx causing other injury, initial encounter: Secondary | ICD-10-CM

## 2010-06-11 NOTE — Patient Instructions (Signed)
Give antibiotics to [facility] as we discussed. Open up capsule and give per G-Tube- clindamycin 300mg  3 x per day. Return on Friday for followup

## 2010-06-11 NOTE — Progress Notes (Signed)
  Subjective:    Patient ID: Megan [name], female    DOB: 01/12/1981, 30 y.o.   MRN: [id number]  HPI fatigue/cough/congestion: Pt brought in today for continued cough and congestion.  Also endorse that pt is not "acting like herself".  Normally when you speak to her and touch her she will open her eyes and smile.  For the past 2 weeks she has not been doing this.  Family is concerned b/c cough and congestion continues.  They state that this has been present almost constantly since d/c from hospital in Oct 2011.  On review of clinic notes- she has been seen for multiple visits most of which were for diagnosis of viral syndome causing cough/congestion.  Family states that mucous is thick and that she seems to struggle to clear the secretions.  No fever.  Teachers at school state she has less wet diapers.  + vomiting/spitting up after G-tube feeds x 1-2 days.   Blisters on feet/feet swelling: Blisters present on medial side of right foot, swelling of feet bilateral.  Happens off and on.  Has been told blisters were a reaction to doxycycline but family doesn't think so b/c she has had these blisters appear when off of doxycycline.  Family reports redness of feet off and on as well.    Review of Systems  Constitutional: Positive for activity change. Negative for fever and diaphoresis.  HENT: Positive for congestion. Negative for nosebleeds, facial swelling, sneezing and ear discharge.   Eyes: Negative for discharge.  Respiratory: Positive for cough and choking. Negative for apnea.   Cardiovascular:       + foot swelling bilateral  Genitourinary: Positive for decreased urine volume.  Musculoskeletal: Negative for joint swelling.  Neurological: Positive for weakness.   As per hpi   Objective:   Physical Exam  Constitutional: She is sleeping.       Responsive to painful stimuli, opens eyes  HENT:  Right Ear: External ear normal.  Left Ear: External ear normal.  Mouth/Throat: No oropharyngeal  exudate.       No blisters present in oropharynx  Cardiovascular: Normal rate, regular rhythm, normal heart sounds and intact distal pulses.  Exam reveals no gallop and no friction rub.   No murmur heard. Pulmonary/Chest: Effort normal and breath sounds normal. No stridor. No respiratory distress. She has no wheezes.       Diminished at bases  Abdominal: Soft. Bowel sounds are normal. She exhibits no distension. There is no tenderness.  Musculoskeletal: Normal range of motion.  Lymphadenopathy:    She has no cervical adenopathy.  Skin:       Bullous blisters on medial aspect of right foot-#1 bister quarter in size on medial right heel now open and healing, medial to arch.  #2 blister present on right medial arch area on plantar/lateral surface- oval in shape. No fluid noted within blister.  Small red scab-like lesion in palmar surface of right hand at base of right thumb on palmar side- size of pencil eraser.          Assessment & Plan:

## 2010-06-12 ENCOUNTER — Encounter: Payer: Self-pay | Admitting: *Deleted

## 2010-06-13 ENCOUNTER — Encounter: Payer: Self-pay | Admitting: Family Medicine

## 2010-06-13 ENCOUNTER — Ambulatory Visit (INDEPENDENT_AMBULATORY_CARE_PROVIDER_SITE_OTHER): Payer: Medicaid Other | Admitting: Family Medicine

## 2010-06-13 VITALS — BP 136/92 | HR 94

## 2010-06-13 DIAGNOSIS — R05 Cough: Secondary | ICD-10-CM | POA: Insufficient documentation

## 2010-06-13 DIAGNOSIS — T17308A Unspecified foreign body in larynx causing other injury, initial encounter: Secondary | ICD-10-CM

## 2010-06-13 DIAGNOSIS — T17910A Gastric contents in respiratory tract, part unspecified causing asphyxiation, initial encounter: Secondary | ICD-10-CM

## 2010-06-13 MED ORDER — CLINDAMYCIN HCL 300 MG PO CAPS: 300.0000 mg | ORAL_CAPSULE | Freq: Three times a day (TID) | ORAL | Status: AC

## 2010-06-13 NOTE — Assessment & Plan Note (Signed)
Secondary to chronic aspiration of stomach contents (see plan)

## 2010-06-13 NOTE — Assessment & Plan Note (Signed)
Pt POA, her grandmother, not present at today's visit.  At some point will need to address Goals of Care- intervention focused care vs. Comfort care- to see what family's wishes are.

## 2010-06-13 NOTE — Assessment & Plan Note (Signed)
Likely combination of chronic aspiration of secretions and possible URI

## 2010-06-13 NOTE — Assessment & Plan Note (Signed)
After careful review of pt history and discussion with family- it seems that the persistent cough is most likely caused by chronic aspiration.  Pt seems to be having reflux of tube feeds and secretions causing choric aspiration problem.  Discussed with family that this is a chronic issue and that there is no way to improve her swallowing mechanism.  Family tearful during discussion.  Discussed with family that we can either admit and give antibiotics or we can try to treat the probable aspiration pnumonia at home with antibiotics (clindamycin) per tube.  Family elected to take her home with strict f/up on Friday 2/17 to reevaluate.

## 2010-06-13 NOTE — Progress Notes (Signed)
  Subjective:    Patient ID: Megan [name], female    DOB: 1980/09/14, 30 y.o.   MRN: [id number]  Coming in today to f/u from last office visit.  Pt has had cough for many weeks.  Was seen a few weeks ago for cough, suggested increasing mucinex and trying OTC nasal spray as pt was not having fevers or lethargy at that time.  Pt returned earlier this week after vomiting w/ concern for aspiration as she was less responsive than usual.  Today, per report, pt has been doing much better.  No fevers, increased alertness, now opening eyes and somewhat responsive.  No further episodes of vomiting.   Pneumonia She complains of cough and sputum production. There is no chest tightness, hemoptysis or wheezing. This is a new problem. The current episode started in the past 7 days. The problem occurs constantly. The problem has been gradually improving. The cough is productive of sputum. Associated symptoms include nasal congestion and rhinorrhea. Pertinent negatives include no fever. Her symptoms are alleviated by anxiolytics and OTC cough suppressant. She reports moderate improvement on treatment. Her past medical history is significant for pneumonia.      Review of Systems  Constitutional: Negative for fever.  HENT: Positive for congestion, rhinorrhea and drooling. Negative for mouth sores.   Respiratory: Positive for cough and sputum production. Negative for hemoptysis and wheezing.   Gastrointestinal: Negative for vomiting and diarrhea.  Skin: Negative for rash.       Objective:   Physical Exam  Constitutional: No distress.  HENT:  Head: Normocephalic and atraumatic.  Neck: Normal range of motion. Neck supple.  Cardiovascular: Normal rate and regular rhythm.   Pulmonary/Chest: No accessory muscle usage. Tachypnea noted. No respiratory distress. She has decreased breath sounds in the left lower field. She has no wheezes. She has no rhonchi. She has no rales.  Abdominal: Soft.  Lymphadenopathy:   She has no cervical adenopathy.  Skin: Skin is warm and dry.          Assessment & Plan:

## 2010-06-13 NOTE — Assessment & Plan Note (Signed)
No further episodes of vomiting or frank aspiration.  Appears to be improving on Clinda. No fevers, increased responsiveness.  Family remains resistant to discussion on goals of care, they plan "for her to be around for a while longer" and that God will take care of her.  Explained that I agree, but we need to have a discussion with healthcare POA about how much they would like [place] to intervene (CPR, intubation, etc). Continue to monitor.  Asked to f/u in 2 weeks, sooner if begins vomiting, having fevers, increased WOB or decreased LOC.

## 2010-06-19 ENCOUNTER — Inpatient Hospital Stay (INDEPENDENT_AMBULATORY_CARE_PROVIDER_SITE_OTHER)
Admission: RE | Admit: 2010-06-19 | Discharge: 2010-06-19 | Disposition: A | Discharge status: Home / Self Care | Payer: Medicaid Other | Source: Ambulatory Visit

## 2010-06-19 ENCOUNTER — Telehealth: Payer: Self-pay | Admitting: Family Medicine

## 2010-06-19 DIAGNOSIS — I10 Essential (primary) hypertension: Secondary | ICD-10-CM

## 2010-06-19 DIAGNOSIS — R609 Edema, unspecified: Secondary | ICD-10-CM

## 2010-06-19 NOTE — Telephone Encounter (Signed)
Wants to speak with rn re: pts bp, is at school & bp was 146/112 at 9:30.

## 2010-06-19 NOTE — Telephone Encounter (Signed)
Caregiver at school where patient lives states BP elevated as stated. She has pitting edema of legs.  Denies any problems with breathing  Or shortness of breath. Advised that patient needs to be checked today  And since we have no available appointment advised to take to urgent care for evaluation.

## 2010-06-20 LAB — POCT I-STAT, CHEM 8
Calcium, Ion: 1 mmol/L — ABNORMAL LOW (ref 1.12–1.32)
Creatinine, Ser: 0.8 mg/dL (ref 0.4–1.2)
Glucose, Bld: 92 mg/dL (ref 70–99)
Hemoglobin: 15 g/dL (ref 12.0–15.0)
Sodium: 136 mEq/L (ref 135–145)
TCO2: 36 mmol/L (ref 0–100)

## 2010-06-23 ENCOUNTER — Ambulatory Visit (INDEPENDENT_AMBULATORY_CARE_PROVIDER_SITE_OTHER): Payer: Medicaid Other | Admitting: Family Medicine

## 2010-06-23 ENCOUNTER — Other Ambulatory Visit: Payer: Self-pay | Admitting: Family Medicine

## 2010-06-23 ENCOUNTER — Encounter: Payer: Self-pay | Admitting: Family Medicine

## 2010-06-23 VITALS — BP 146/101 | HR 93 | Temp 98.7°F

## 2010-06-23 DIAGNOSIS — R946 Abnormal results of thyroid function studies: Secondary | ICD-10-CM

## 2010-06-23 DIAGNOSIS — I10 Essential (primary) hypertension: Secondary | ICD-10-CM | POA: Insufficient documentation

## 2010-06-23 HISTORY — DX: Essential (primary) hypertension: I10

## 2010-06-23 LAB — COMPREHENSIVE METABOLIC PANEL
ALT: 12 U/L (ref 0–35)
Albumin: 3.9 g/dL (ref 3.5–5.2)
BUN: 12 mg/dL (ref 6–23)
CO2: 30 mEq/L (ref 19–32)
Calcium: 9.4 mg/dL (ref 8.4–10.5)
Chloride: 90 mEq/L — ABNORMAL LOW (ref 96–112)
Creat: 0.56 mg/dL (ref 0.40–1.20)

## 2010-06-23 LAB — CBC
MCH: 31.8 pg (ref 26.0–34.0)
MCHC: 33.9 g/dL (ref 30.0–36.0)
Platelets: 332 10*3/uL (ref 150–400)
RBC: 4.21 MIL/uL (ref 3.87–5.11)
RDW: 17.9 % — ABNORMAL HIGH (ref 11.5–15.5)

## 2010-06-23 LAB — POCT URINALYSIS DIPSTICK
Bilirubin, UA: NEGATIVE
Leukocytes, UA: NEGATIVE
Nitrite, UA: NEGATIVE
Urobilinogen, UA: 0.2
pH, UA: 7

## 2010-06-23 LAB — CONVERTED CEMR LAB
BUN: 12 mg/dL (ref 6–23)
CO2: 30 meq/L (ref 19–32)
Creatinine, Ser: 0.56 mg/dL (ref 0.40–1.20)
Glucose, Bld: 81 mg/dL (ref 70–99)
HCT: 39.5 % (ref 36.0–46.0)
Hemoglobin: 13.4 g/dL (ref 12.0–15.0)
MCHC: 33.9 g/dL (ref 30.0–36.0)
MCV: 93.8 fL (ref 78.0–100.0)
RBC: 4.21 M/uL (ref 3.87–5.11)
TSH: 14.411 microintl units/mL — ABNORMAL HIGH (ref 0.350–4.500)
Total Bilirubin: 0.4 mg/dL (ref 0.3–1.2)
Total Protein: 9 g/dL — ABNORMAL HIGH (ref 6.0–8.3)
WBC: 6.1 10*3/uL (ref 4.0–10.5)

## 2010-06-23 LAB — TSH: TSH: 14.411 u[IU]/mL — ABNORMAL HIGH (ref 0.350–4.500)

## 2010-06-23 LAB — POCT UA - MICROSCOPIC ONLY

## 2010-06-23 MED ORDER — HYDROCHLOROTHIAZIDE 25 MG PO TABS: 25.0000 mg | ORAL_TABLET | Freq: Every day | ORAL | Status: DC

## 2010-06-23 NOTE — Patient Instructions (Addendum)
Follow-up with your PCP in 2 weeks Low salt diet

## 2010-06-24 DIAGNOSIS — R946 Abnormal results of thyroid function studies: Secondary | ICD-10-CM | POA: Insufficient documentation

## 2010-06-24 LAB — CONVERTED CEMR LAB
T3, Total: 84 ng/dL (ref 80.0–204.0)
T4, Total: 4.6 ug/dL — ABNORMAL LOW (ref 5.0–12.5)

## 2010-06-24 NOTE — Assessment & Plan Note (Signed)
Hypertensive today with consistently elevated BP's notes at school and at urgent care in the past week.  Improved today on daily lasix.  Will d/c lasix and start HCTZ.  Will draw CMET, TSH, CBC and U/A for initial workup.  No evidence of infection or new CNS (although difficult to assess).  Advised low salt diet, patient to follow-up with PCP in 2 weeks to reassess BP and discuss with family's goals of care and need for further workup.

## 2010-06-24 NOTE — Progress Notes (Signed)
  Subjective:    Patient ID: Megan [name], female    DOB: September 29, 1980, 30 y.o.   MRN: [id number]  HPI Work in appt for hypertension.  No previous diagnosis.  Patient is profoundly disabled due to CP.  Unable to communicate.  Family notes school nurse wrote down elevated blood pressures of 140's-170's over 110's.  Was taken to urgent care and prescribed lasix to lower BP and exam noted some mild lower extremity edema.  No new meds.  Per family, she is at her baseline and does not appear ill.  It fed by peg tube, no change in stools.  No change in breathing.  No fever.     Review of Systemssee HPI     Objective:   Physical Exam  Constitutional:       Non-communicative, in wheelchair  HENT:  Right Ear: External ear normal.  Left Ear: External ear normal.  Mouth/Throat: Oropharynx is clear and moist. No oropharyngeal exudate.  Eyes: Conjunctivae are normal.  Neck: Neck supple.  Cardiovascular: Normal rate and regular rhythm.   No murmur heard. Pulmonary/Chest: Effort normal and breath sounds normal. No respiratory distress. She has no wheezes. She has no rales.  Abdominal: Bowel sounds are normal. She exhibits no distension. There is no tenderness. There is no rebound and no guarding.  Musculoskeletal:       contractures  Skin: No rash noted.          Assessment & Plan:

## 2010-06-24 NOTE — Assessment & Plan Note (Signed)
TSH abnormal.  Will add on T4, T3

## 2010-06-25 ENCOUNTER — Other Ambulatory Visit: Payer: Self-pay | Admitting: Family Medicine

## 2010-06-25 DIAGNOSIS — E039 Hypothyroidism, unspecified: Secondary | ICD-10-CM | POA: Insufficient documentation

## 2010-06-25 MED ORDER — LEVOTHYROXINE SODIUM 88 MCG PO TABS: 88.0000 ug | ORAL_TABLET | Freq: Every day | ORAL | Status: DC

## 2010-06-27 ENCOUNTER — Telehealth: Payer: Self-pay | Admitting: Family Medicine

## 2010-06-27 NOTE — Telephone Encounter (Signed)
Called to discuss low thyroid.  Dr. Fara Boros has already addressed with family and they say they have picked up synthroid.  BP in 140's.

## 2010-07-09 LAB — BASIC METABOLIC PANEL
CO2: 26 mEq/L (ref 19–32)
CO2: 27 mEq/L (ref 19–32)
Calcium: 8.4 mg/dL (ref 8.4–10.5)
Calcium: 8.7 mg/dL (ref 8.4–10.5)
Creatinine, Ser: 0.43 mg/dL (ref 0.4–1.2)
Creatinine, Ser: 0.46 mg/dL (ref 0.4–1.2)
GFR calc Af Amer: >60 mL/min (ref >60)
GFR calc Af Amer: >60 mL/min (ref >60)
GFR calc non Af Amer: >60 mL/min (ref >60)
GFR calc non Af Amer: >60 mL/min (ref >60)
Sodium: 140 mEq/L (ref 135–145)

## 2010-07-09 LAB — URINALYSIS, ROUTINE W REFLEX MICROSCOPIC
Nitrite: NEGATIVE
Protein, ur: NEGATIVE mg/dL
Urobilinogen, UA: 1 mg/dL (ref 0.0–1.0)

## 2010-07-09 LAB — CBC
Hemoglobin: 11.3 g/dL — ABNORMAL LOW (ref 12.0–15.0)
MCH: 31.7 pg (ref 26.0–34.0)
MCH: 32.1 pg (ref 26.0–34.0)
MCH: 32.2 pg (ref 26.0–34.0)
MCHC: 32.5 g/dL (ref 30.0–36.0)
MCHC: 32.8 g/dL (ref 30.0–36.0)
Platelets: 276 10*3/uL (ref 150–400)
Platelets: 290 10*3/uL (ref 150–400)
RBC: 3.32 MIL/uL — ABNORMAL LOW (ref 3.87–5.11)
RBC: 3.38 MIL/uL — ABNORMAL LOW (ref 3.87–5.11)
WBC: 17.8 10*3/uL — ABNORMAL HIGH (ref 4.0–10.5)

## 2010-07-09 LAB — CULTURE, BLOOD (ROUTINE X 2)
Culture  Setup Time: 201110012024
Culture: NO GROWTH

## 2010-07-09 LAB — URINE MICROSCOPIC-ADD ON

## 2010-07-09 LAB — DIFFERENTIAL
Basophils Relative: 0 % (ref 0–1)
Eosinophils Absolute: 0 10*3/uL (ref 0.0–0.7)
Monocytes Absolute: 3.9 10*3/uL — ABNORMAL HIGH (ref 0.1–1.0)
Monocytes Relative: 21 % — ABNORMAL HIGH (ref 3–12)
Neutrophils Relative %: 69 % (ref 43–77)

## 2010-07-09 LAB — PRIMIDONE AND METABOLITE LEVEL
Phenobarbital: 17.6 ug/mL (ref 15.0–40.0)
Primidone, Serum: 3 ug/mL — ABNORMAL LOW (ref 5–12)

## 2010-07-09 LAB — POCT I-STAT, CHEM 8
HCT: 39 % (ref 36.0–46.0)
Hemoglobin: 13.3 g/dL (ref 12.0–15.0)
Potassium: 3.8 mEq/L (ref 3.5–5.1)
Sodium: 135 mEq/L (ref 135–145)
TCO2: 26 mmol/L (ref 0–100)

## 2010-07-09 LAB — MRSA PCR SCREENING: MRSA by PCR: NEGATIVE

## 2010-07-09 LAB — PROCALCITONIN: Procalcitonin: 6.67 ng/mL

## 2010-07-13 LAB — DIFFERENTIAL
Basophils Relative: 1 % (ref 0–1)
Eosinophils Absolute: 0 10*3/uL (ref 0.0–0.7)
Eosinophils Relative: 0 % (ref 0–5)
Lymphs Abs: 4.7 10*3/uL — ABNORMAL HIGH (ref 0.7–4.0)
Monocytes Relative: 10 % (ref 3–12)

## 2010-07-13 LAB — POCT I-STAT, CHEM 8
Chloride: 100 mEq/L (ref 96–112)
Creatinine, Ser: 0.6 mg/dL (ref 0.4–1.2)
Glucose, Bld: 97 mg/dL (ref 70–99)
HCT: 40 % (ref 36.0–46.0)
Potassium: 3.9 mEq/L (ref 3.5–5.1)

## 2010-07-13 LAB — CBC
Hemoglobin: 13.1 g/dL (ref 12.0–15.0)
RBC: 3.82 MIL/uL — ABNORMAL LOW (ref 3.87–5.11)
RDW: 19.4 % — ABNORMAL HIGH (ref 11.5–15.5)

## 2010-07-13 LAB — URINE CULTURE: Colony Count: 50000

## 2010-07-13 LAB — URINALYSIS, ROUTINE W REFLEX MICROSCOPIC
Ketones, ur: 15 mg/dL — AB
Nitrite: NEGATIVE
Protein, ur: NEGATIVE mg/dL

## 2010-07-13 LAB — GC/CHLAMYDIA PROBE AMP, GENITAL
Chlamydia, DNA Probe: NEGATIVE
GC Probe Amp, Genital: NEGATIVE

## 2010-07-13 LAB — WET PREP, GENITAL: Clue Cells Wet Prep HPF POC: NONE SEEN

## 2010-07-13 LAB — CULTURE, BLOOD (ROUTINE X 2): Culture: NO GROWTH

## 2010-07-13 LAB — RPR: RPR Ser Ql: NONREACTIVE

## 2010-07-13 IMAGING — CR DG CHEST 2V
1 series · 1 of 1 positions shown · non-contrast
Comparison: 11/27/2007

CLINICAL DATA: Congestion, fever and cough.

CHEST - 2 VIEW

[w chest lat]
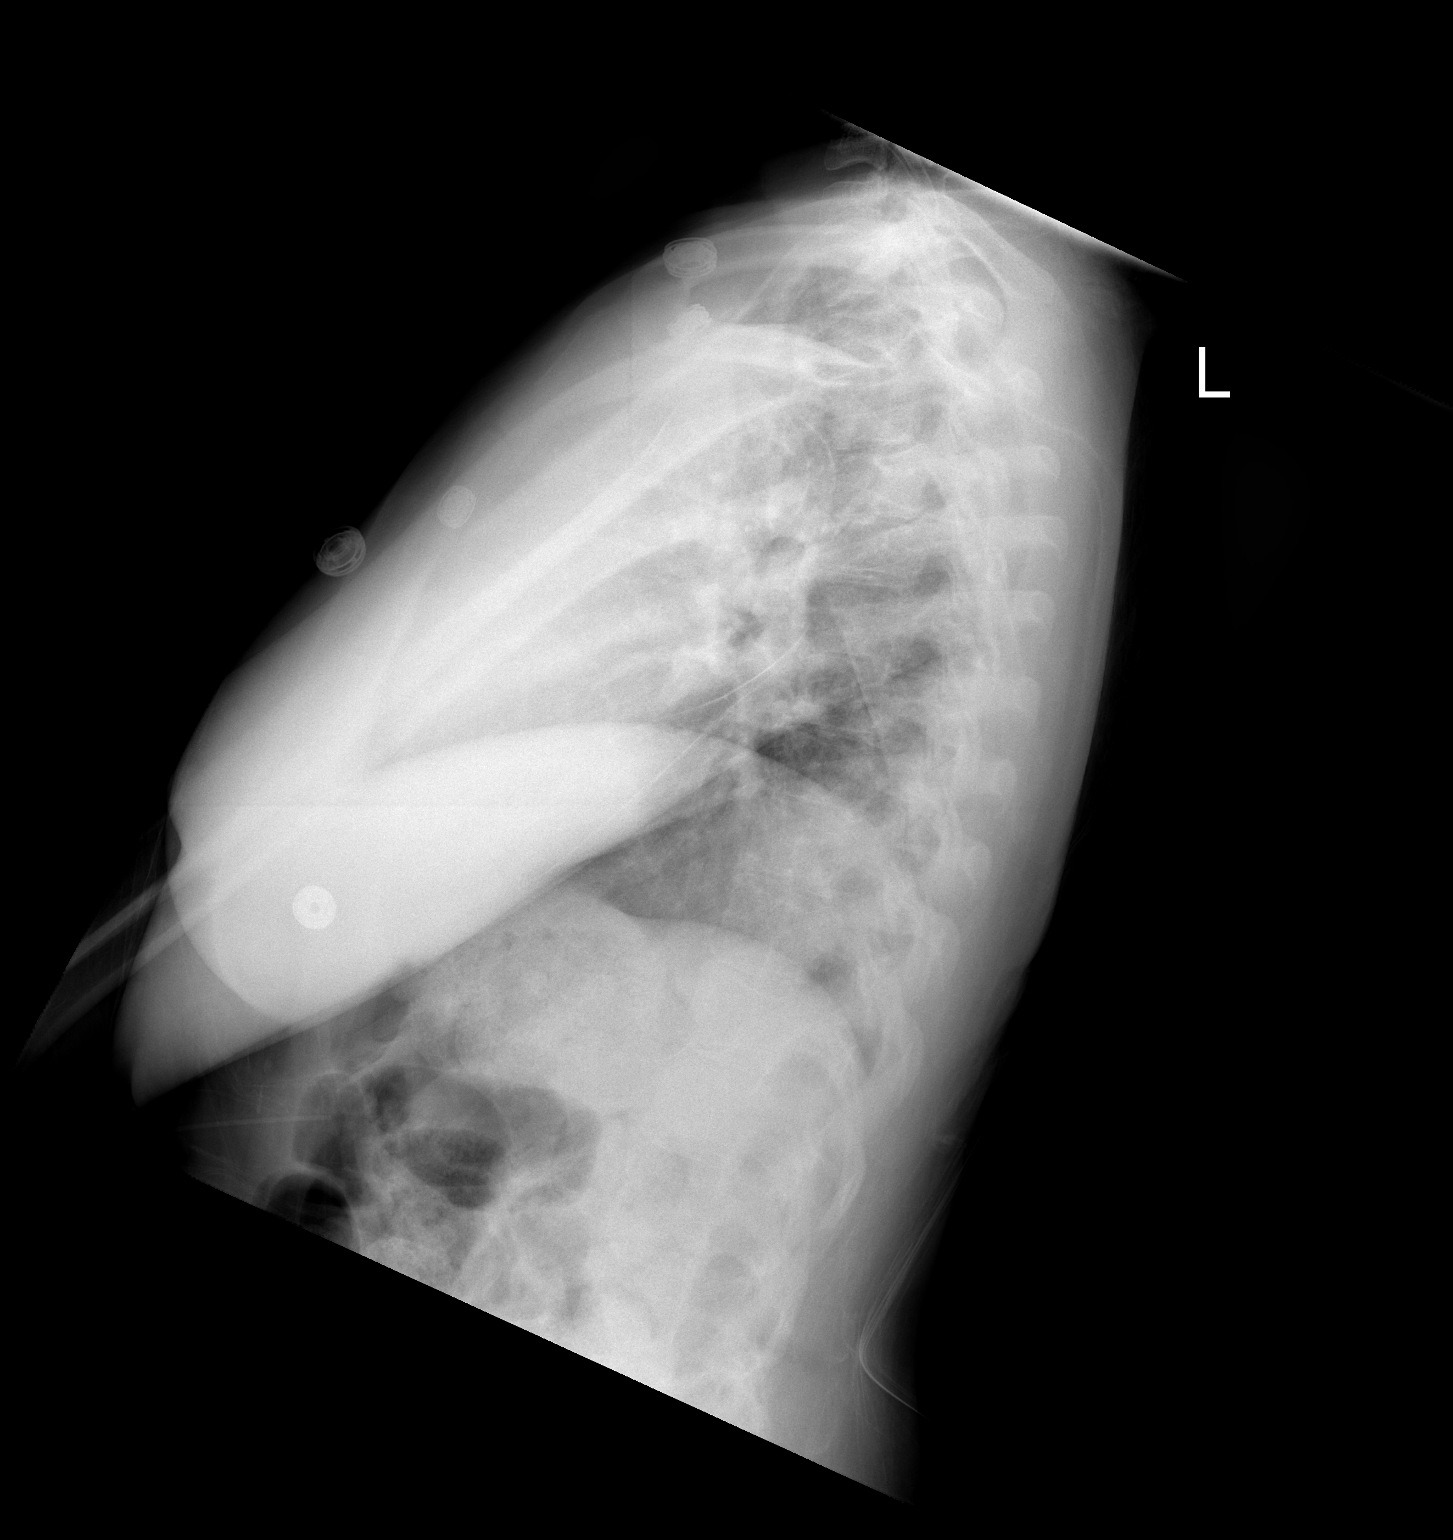

[1 of 1 positions shown; findings below may reference images not displayed]

FINDINGS: The cardiac silhouette, mediastinal and hilar contours
are stable.  Mild elevation of the right hemidiaphragm with
vascular crowding and atelectasis.  The left lung is clear.
IMPRESSION: 1.  Stable appearance the chest with mild elevation of the right
hemidiaphragm and overlying vascular crowding and atelectasis.
2.  No focal infiltrates or pulmonary edema.

## 2010-07-22 ENCOUNTER — Telehealth: Payer: Self-pay | Admitting: Family Medicine

## 2010-07-22 NOTE — Telephone Encounter (Signed)
Will fwd. To PCP 

## 2010-07-22 NOTE — Telephone Encounter (Signed)
Asking for order to go to [facility] for pts milk (Jevity)

## 2010-07-23 ENCOUNTER — Encounter: Payer: Self-pay | Admitting: Family Medicine

## 2010-07-23 ENCOUNTER — Other Ambulatory Visit: Payer: Self-pay | Admitting: Family Medicine

## 2010-07-23 ENCOUNTER — Ambulatory Visit (INDEPENDENT_AMBULATORY_CARE_PROVIDER_SITE_OTHER): Payer: Medicaid Other | Admitting: Family Medicine

## 2010-07-23 VITALS — BP 107/72 | HR 92 | Temp 97.4°F

## 2010-07-23 DIAGNOSIS — I10 Essential (primary) hypertension: Secondary | ICD-10-CM

## 2010-07-23 NOTE — Assessment & Plan Note (Signed)
Uncertain of underlying issue. I suspect that her BP is due to a full and distended bladder. I think this may be something like autonomic dysreflexia. I also am not sure that if this is benign HTN that the risks of side effects for treatment are worse than the sequelae of untreated HTN. I feel that she is much more likely to die from a pneumonia or hypotension made worse by BP meds than from heart disease or a stroke.   Plan: Stop HCTZ, and record home BP and bring in log. STOP school BP measurement. Follow up with Dr. Sharene Skeans to discuss possibly I/O caths at school and further neuro workup.  I feel HCTZ will only make her bladder distension worse.    F/u with Dr. Fara Boros in 1 month with BP logs and to talk about goals of care. Would recommend using an agent like norvasc or a beta blocker for BP control rather than HCTZ.  Also disuses if treating HTN part of goals of care. I am not sure it should be.

## 2010-07-23 NOTE — Telephone Encounter (Signed)
Can you please find out the dosing, etc so I can do a medication order for it?  Thanks!

## 2010-07-23 NOTE — Patient Instructions (Signed)
Stop HCTZ Check BP 1 x a week until you see Dr. Fara Boros. Bring those values with you.  Let Dr. Sharene Skeans know about the not peeing at school.  Think about her goals of her health care for the next 10 years. Lets have a conversation with your doctor about these difficult and long term issues.

## 2010-07-23 NOTE — Progress Notes (Signed)
Megan [name] was found to have a BP of 150/100 at home and at school.  She has been on HCTZ 25 for blood pressure issues over the past month or so. Her care-givers note that she will not void at all during school but then void a great deal at night.  She acts normally and has not had issues with breathing.    ROS: Unable to assess due to inability to communicate. No fevers.   Exam:  Vs noted.  VOZ:DGUYQIH disabled in wheelchair. Mouth open. Some grimace.  HEENT:  MMM Lungs: CTABL Nl WOB Heart: RRR no MRG Abd: NABS, NT, ND Exts: Non edematous BL  LE

## 2010-07-24 NOTE — Telephone Encounter (Signed)
Called [facility]. We need to fax RX or order to [phone] Jevity  Use  4 cans daily through bolus feeding. 3 month supply with prn refills. Fwd. To Dr.McGill to write order. Called pt's cousin and told her, that we would take care of this. Megan [name]

## 2010-07-25 ENCOUNTER — Other Ambulatory Visit: Payer: Self-pay | Admitting: Family Medicine

## 2010-07-25 MED ORDER — JEVITY PO LIQD: 4.0000 | Freq: Every day | ORAL | Status: DC

## 2010-09-09 NOTE — Discharge Summary (Signed)
NAME:  Megan [name], Megan [name]                  ACCOUNT NO.:  [id number]   MEDICAL RECORD NO.:  [id number]          PATIENT TYPE:  INP   LOCATION:  3037                         FACILITY:  [facility]   PHYSICIAN:  [name]. Hickling, M.D.DATE OF BIRTH:  [birth year]   DATE OF ADMISSION:  08/29/2007  DATE OF DISCHARGE:  09/22/2007                               DISCHARGE SUMMARY   FINAL DIAGNOSES:  1. 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  2. 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  3. 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  4. Severe mental retardation, 318.1.  5. Aspiration pneumonia.  6. Severe dysphagia, 787.7.  7. Prolonged intubation with respiratory failure.  8. Gastric motility disorder, resolved.   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  This consists of intractable seizures.  EEG showed a  generalized slow spike and wave discharge, retardation, quadriparesis.  In her case, cortical blindness which is distinct from the syndrome.   The patient was admitted with frequent seizures.  We attempted to bring  the seizures under control.  On the second day of her hospitalization,  she developed severe respiratory distress and required intubation for  ventilatory failure.  She is followed by critical care team.  She is  treated with antibiotics and remained difficult to wean from the  ventilator.  We were able to institute tube feedings by a postpyloric  Panda tube because of persistent vomiting that occurred because of  gastric dysmotility.   Seizures continued and multiple adjustments were made with medications.  This included adding Keppra to her treatment which failed.   Over time, we switched back to the medicines that she has taken that  provided some measure of control of her seizures, primidone (Mysoline),  valproic acid (Depakene), and phenytoin (Dilantin).  This combination of  medications slowed the frequency  of generalized seizures.  She continues  to have frequent minor motor seizures that involve facial twitching.  This is her baseline.  She has rare generalized tonic-clonic seizures.  She has not had any that have been reported in the chart in the last 10  days.   The patient required Foley catheter while she was in the ICU.  We were  able to discontinue that yesterday.  She has urinary incontinence.  Her  postvoid residuals were no greater than 20 mL.   Urinalysis and urine culture is pending at this time.   PHYSICAL EXAMINATION:  VITAL SIGNS:  On examination today, temperature  98, blood pressure 95/68, resting pulse 84, respirations 18, and oxygen  saturation 97% on room air.  LUNGS:  Her lungs are clear.  She has some stertorous breathing when her  head is flexed, but she is moving air very well.  HEART:  Normal heart sounds.  ABDOMEN:  Bowel sounds normal.  Percutaneous gastrostomy site is clean  and dry.  EXTREMITIES:  Her extremities show evidence of contractures in the legs,  the hips, and knees, and in the arms, at the elbows, and shoulders, and  also at  the wrist.  She has some movements, usually most of them in  withdrawal.  NEUROLOGIC:  The patient is barely aware of the examiner.  She has no  language.  Cranial nerves, round reactive pupils, roving eye movements.  It is not possible to see the fundi.  She has lower facial weakness with  some drooling.  She has problems with her gag and controlling her  airway.  Motor examination shows spastic quadriparesis.  Reflexes are  present but diminished because of cord contraction.  She has bilateral  extensor plantar responses.   The patient is at neurological baseline at this time and is ready for  discharge.   DISCHARGE MEDICATIONS:  Include:  1. Mysoline 250 mg tablets one-half twice daily.  2. Protonix 2 mg per mL, 20 mL, once daily.  3. Dilantin 125 mg per 5 mL, 4 mL, 3 times daily.  4. Depakene 250 mg per 5 mL, 13 mL,  4 times daily.  5. Lopressor 25 mg per 10 mL, 10 mL, twice daily.  6. Jevity 300 mL every 6 hours.  She had been on 480 mL 3 times a day.      I think that it may be too much volume for her stomach and I would      recommend the current dosing.  Gradually, the patient can move back      to the total volume that she was taking, but this was judged by our      nutrition to be an adequate amount for maintenance of weight.   LABORATORY DATA:  The patient's last laboratory studies were as follows:  CBC:  Hemoglobin 10.3, hematocrit 30.2, white blood cell count 5300,  platelet count 265,000, and MCV 99.  Sodium 136, potassium 4.4, chloride  104, CO2 29, BUN 7, creatinine 0.44, glucose 113, and calcium 8.4.  Her  most recent Mysoline level was 3, phenobarbital was 13, phenytoin level  was 1.59 with a total level of 9.7, this is solidly in the therapeutic  range.  Most recent Depakene level was 87.   The patient is discharged in stable condition.  She will be followed by  advanced home care on a weekly basis to check on her airway, her seizure  control, and make certain that there are no other durable goods that are  needed for the patient.  She will be seen in followup in our office by  Theressa Millard, FPNP in about 1 month's time.  We will see her sooner  depending on clinical need.      [name]. Sharene Skeans, M.D.  Electronically Signed     WHH/MEDQ  D:  09/22/2007  T:  09/22/2007  Job:  [id number]

## 2010-09-09 NOTE — H&P (Signed)
NAME:  Megan [name], Megan [name]                  ACCOUNT NO.:  [id number]   MEDICAL RECORD NO.:  [id number]          PATIENT TYPE:  INP   LOCATION:  3037                         FACILITY:  [facility]   PHYSICIAN:  Casimiro Needle L. Reynolds, M.D.DATE OF BIRTH:  22-May-1980   DATE OF ADMISSION:  08/29/2007  DATE OF DISCHARGE:                              HISTORY & PHYSICAL   CHIEF COMPLAINT:  Seizures, nausea, and vomiting.   HISTORY OF PRESENT ILLNESS:  Inpatient history and physical for one of  several [facility]'s admissions of this 30 year old woman who  is a patient of Dr. Sharene Skeans.  She has a lifelong history of mental  retardation, Lennox-Gastaut syndrome, and static encephalopathy with  spastic quadriparesis.  She was admitted from August 17, 2007, to August 19, 2007, by Dr. Vickey Huger for increased seizure activity and at that  time was resumed on Dilantin.  I spoke by phone with the patient's  grandmother who is her primary caretaker.  She states that even since  she got back on the Dilantin, she has had ongoing frequent seizure  activity, sometimes more than 10 seizures a day, which was over her  usual baseline.  Then about 3 days ago, she developed vomiting, throwing  out several times a day and having difficulty keeping her tube feeds  down as well as keeping her pills down.  For these reasons, she was  brought back to the emergency department today for further evaluation.  Her grandmother does not think that she has had any fever, although she  took her temperature once and believes that was around 99.  She does  seem to have some nasal congestion and grandmother wonders if she has  sinusitis and an upper respiratory infection.  She denies any cough or  shortness of breath.  She has not had any diarrhea, but is not moving  her bowels for the last 3 days as far as the grandmother knows.  She is  admitted for further evaluation at this time.  Past medical history is  as above.  She has had  pneumonia and ileus in the past.  She had PEG  tube placement in 1990s and replaced in 2007.  She has followed in our  office by Dr. Sharene Skeans and by Darrol Angel, N.P.  She had a recent  admission as above.   FAMILY HISTORY:  Remarkable for mother with diabetes.   SOCIAL HISTORY:  She is dependent in all activities of daily living.  Her grandmother is primary caretaker.  The patient does go to [employer].   ALLERGIES:  No known drug allergies.   MEDICATIONS:  As best I can tell her current medications include:  1. Depakene syrup 500 mg t.i.d.  2. Primidone 125 mg b.i.d.  3. Phenytoin 125 mg b.i.d.  4. Jevity 8 cans daily.  5. Zantac 75 mg b.i.d.   REVIEW OF SYSTEMS:  Limited by the patient's severe mental retardation.  I got from the grandmother information as mentioned in the HPI.   PHYSICAL EXAMINATION:  VITAL SIGNS:  Temperature 97.3, blood pressure  124/87, pulse  113, and respirations 18.  GENERAL:  This is a chronically ill-appearing woman supine, in hospital  bed.  No evidence of distress.  HEENT:  Cranium normocephalic and atraumatic.  Oropharynx benign.  I  cannot examine oropharynx due to protuberant jaw and tongue.  NECK:  Supple without carotid bruits and lymphadenopathy.  CHEST:  Clear to auscultation bilaterally.  HEART:  Tachycardiac, regular rhythm.  No murmur.  ABDOMEN:  Soft with decreased bowel sounds.  EXTREMITIES:  No edema.  NEUROLOGIC:  Mental status; she appears awake and seems to regard the  examiner but is nonverbal and does not follow commands.  Admixed,  however, to speech.  Cranial Nerves:  Pupils are equal and reactive.  She looks to the left and right spontaneously.  Face is symmetric.  Motor:  She moves all extremities spontaneously.  Sensory:  She  withdraws extremities to pain roughly symmetrically.  Reflexes:  Brisk  throughout.  Toes are upgoing bilaterally.   LABORATORY REVIEW:  CBC; white count 7.6, hemoglobin 15.5, and platelets  clumped.   BMET is unremarkable.  Dilantin level is 18.8 and valproate  level is 72.   IMPRESSION:  1. Seizures, in poor control.  2. Nausea and vomiting of uncertain etiology, but if the phenytoin      level was trough level, question if there is a phenytoin toxicity.   PLAN:  We will hydrate, observe in the hospital, and check some labs in  the morning.  Check KUB, urinalysis, and chest x-ray.  We will also  watch her seizure frequency in the hospital.  It looks like her dilantin  is very difficult to keep in a reasonable range, if that is the case we  might want to switch her to another anticonvulsant such as Keppra.  Her  mother has also expressed some interest in rufinamide, which is approved  for Lennox-Gastaut syndrome, and that may also be a consideration.      Michael L. Thad Ranger, M.D.  Electronically Signed     MLR/MEDQ  D:  08/29/2007  T:  08/30/2007  Job:  [id number]

## 2010-09-09 NOTE — Procedures (Signed)
EEG NUMBER:  [id number]   CLINICAL HISTORY:  The patient is a 30 year old with Lennox-Gastaut  encephalopathy.  She has been hospitalized with recurrent minor motor  seizures and generalized tonic-clonic seizures.  She has had persistent  vomiting and lethargy.  The study is being done to look for the presence  of underlying status epilepticus (345.01, 345.11).   PROCEDURE:  The tracing is carried out on a 32-channel digital Cadwell  recorder reformatted into 16-channel montages with one devoted to EKG.  Medications include Mysoline, Protonix, heparin, Versed, Jevity,  Depacon, Keppra, Tylenol, and fentanyl.  The International 10-20 system  lead placement was used.   DESCRIPTION OF FINDINGS:  Dominant frequency is a 3 Hz, 50 mcV delta  range activity.  Mixed frequency polymorphic delta range activity was  seen throughout the record.   The patient has sharply contoured slow wave independent and synchronous  of 75 to 90 mcV that are seen predominantly in the frontal and temporal  regions.   There are runs of 1.5-2 Hz generalized slow spike and wave activity  lasting for 3 seconds at the time and occasionally rhythmic bursts of 6  seconds duration of 3 Hz activity, all of 100 to 120 mcV amplitude.   There was no clinical behavior to accompany this.   EKG showed a regular sinus rhythm.   IMPRESSION:  Abnormal EEG on the basis of diffuse background slowing  that is symmetric and indicative of underlying structural and/or  vascular abnormality or a static encephalopathy.  In addition, there is  evidence of spike and wave and slow spike and wave in brief runs that  appeared to be interictal rather than ictal and are unassociated with  any clinical behaviors.  This is consistent with the diagnosis of a  child with Lennox-Gastaut encephalopathy.      [name]. Sharene Skeans, M.D.  Electronically Signed     ZOX:WRUE  D:  09/01/2007 12:43:54  T:  09/02/2007 45:40:98  Job #:  [id number]

## 2010-09-09 NOTE — H&P (Signed)
NAME:  Megan [name], Megan [name] NO.:  [id number]   MEDICAL RECORD NO.:  [id number]          PATIENT TYPE:  INP   LOCATION:  3030                         FACILITY:  [facility]   PHYSICIAN:  Marlan Palau, M.D.  DATE OF BIRTH:  May 18, 1980   DATE OF ADMISSION:  01/30/2008  DATE OF DISCHARGE:                              HISTORY & PHYSICAL   HISTORY OF PRESENT ILLNESS:  Megan [name] is a 30 year old black female,  born 26-May-1980, with a history of intractable seizures and mental  retardation, cerebral palsy, Lennox-Gastaut syndrome, and cortical  blindness.  The patient is cared for at home by her grandmother, [name].  The patient has recently been in the hospital in the latter part  of September 2009 with pneumonia.  The patient was treated with a course  of antibiotics that include vancomycin, Zosyn, and Zithromax.  The  patient has had in the past intractable seizures admitted by Dr.  Sharene Skeans in May 2009 with increased frequency of seizures.  By history,  the patient may have more than 10 seizures a day.  The patient has  returned to the hospital today with greater than 13 seizures today.  The  patient is on a low-grade temperature of 100.5.  The patient is being  admitted for evaluation of the seizure events.  Depakote level is around  120.  The Dilantin level, Mysoline level, and phenobarbital levels are  pending.  The patient has been treated with Ativan with cessation of the  seizures.   PAST MEDICAL HISTORY:  Significant for:  1. History of intractable seizures.  2. Mental retardation.  3. Cerebral palsy.  4. Cortical blindness.  5. Recent Dilantin toxicity in September 2009.  6. Recent pneumonia.  7. PEG tube Placement.   MEDICATIONS:  1. Mysoline 125 mg twice a day.  2. Depakene 650 mg q.i.d.  3. Dilantin 100 mg 3 times daily.  4. Lopressor 25 mg b.i.d.  5. Jevity 300 mg q.6 h.  6. Zantac 75 mg twice daily.   The patient has no known allergies.   SOCIAL HISTORY:  Does not smoke or drink.  [name] grandmother, [name].  The patient is dependent for all activities of daily living.  The  patient goes to [employer].   FAMILY MEDICAL HISTORY:  Notable for mother with diabetes.   REVIEW OF SYSTEMS:  Cannot be obtained.   PHYSICAL EXAMINATION:  VITALS:  Blood pressure is 128/88, heart rate  120, temperature 100.5.  GENERAL:  This patient is a small for age black female who is sedated at  the time of examination.  HEAD:  Atraumatic.  EYES:  Pupils are pinpoint trace reactive.  Disks are difficult to  visualize.  NECK:  Supple.  RESPIRATORY:  Clear.  CARDIOVASCULAR:  Regular rate and rhythm.  No obvious murmurs or rubs  noted.  ABDOMEN:  Some decrease in bowel sounds.  No organomegaly or tenderness  noted.  EXTREMITIES:  Without significant edema.  NEUROLOGIC:  Cranial nerves as above.  Facial symmetry is present.  The  patient has positive doll's eyes.  The patient will not respond to  verbal commands.  Decreased tone in all fours.  Minimal withdrawal with  the legs to deep pain stimulation, similar activity seen in the arms.  Deep tendon reflexes depressed throughout.  Toes neutral bilaterally.  The patient will not cooperate for cerebellar testing.  Could not be  ambulated.   Laboratory values are notable for white count of 14.5, hemoglobin of  11.3, hematocrit 35.0, MCV of 100.5, platelets are clumped, but normal  in amount.  Sodium 144, potassium 3.4, chloride of 107, CO2 of 26,  glucose 153, BUN of 8, creatinine 0.52, calcium 9.2.  CK of 42, folic  acid level 121.4.  Urinalysis reveals specific gravity of 1.035,  otherwise unremarkable.   Chest x-ray done shows mild hypoventilation with bibasilar atelectasis.  No definite acute infiltrate is noted.   IMPRESSION:  1. History of for intractable seizures disorder with recurrence.  2. Cerebral palsy with mental  retardation.  3. Recent pneumonia.   This patient has severe neurologic deficits.  The patient has seizures  on a frequent basis, but has had more seizures than usual today.  The  patient comes in for further evaluation.   PLAN:  1. Admission to [facility].  2. Check Dilantin, Mysoline, and phenobarbital levels.  3. Continue medications for now.  Readjust dose accordingly.  4. The patient will have blood cultures and urine cultures.  5. We will follow patient's clinical course while in house.      Marlan Palau, M.D.  Electronically Signed     CKW/MEDQ  D:  01/30/2008  T:  01/31/2008  Job:  [id number]   cc:   [facility]

## 2010-09-09 NOTE — Discharge Summary (Signed)
NAME:  Megan [name], Megan [name] NO.:  [id number]   MEDICAL RECORD NO.:  [id number]          PATIENT TYPE:  INP   LOCATION:  3030                         FACILITY:  [facility]   PHYSICIAN:  Marlan Palau, M.D.  DATE OF BIRTH:  January 14, 1981   DATE OF ADMISSION:  01/30/2008  DATE OF DISCHARGE:                               DISCHARGE SUMMARY   ADDENDUM   HISTORY:  This patient is a 30 year old black female with a history of  cerebral palsy, severe mental retardation, intractable seizures, was  admitted with pneumonia and increased frequency of seizures.  The  patient has undergone a PEG tube placement during this hospitalization  and has undergone the conversion to Coumadin for DVT in the right leg  associated with a central line placement in right femoral vein.  The  patient has received 7-10-day course of antibiotics, has done well with  pneumonia.  At this point, the Coumadin has been added to her drug  regimen and currently this patient will be discharged on Coumadin 5 mg  Mondays, Wednesdays, and Fridays, and 2.5 mg all other days.  The  patient will follow up with [facility] within 4-6  weeks to see Dr. Sharene Skeans and will have blood work done within 3 days  following discharge.  At the time of discharge, the patient is  neurologically stable.  Pneumonia, lung fields are clear.  PEG tube site  looks good.      Marlan Palau, M.D.  Electronically Signed     CKW/MEDQ  D:  02/13/2008  T:  02/13/2008  Job:  [id number]   cc:   [facility]

## 2010-09-09 NOTE — Consult Note (Signed)
NAME:  Megan [name], Megan [name]                  ACCOUNT NO.:  [id number]   MEDICAL RECORD NO.:  [id number]          PATIENT TYPE:  INP   LOCATION:  3032                         FACILITY:  [facility]   PHYSICIAN:  Melvyn Novas, M.D.  DATE OF BIRTH:  1981/04/09   DATE OF CONSULTATION:  08/18/2007  DATE OF DISCHARGE:                                 CONSULTATION   She is currently in room 25 at [facility] ER.   She is a 30 year old female with a history of cerebral palsy, Lennox-  Gastaut syndrome, PEG tube feeding, and nonverbal mental retardation.  The patient was brought today by a private vehicle by her grandmother.  Her grandmother is her main caretaker.   REASON FOR THE CONSULTATION:  Recurrent seizures.   The patient presents with a re-onset of recurrent seizures today.  Her  grandmother states she had about 10 events yesterday at her Adult Day  [employer].  She has a history of recurrent seizures, and is followed by Dr.  Sharene Skeans.  The patient's seizures consist of jerking, stool  incontinence, and tongue biting.  She is currently on Depakote and just  yesterday started again on Dilantin after being taken of 2-1/2 month on  a drug holiday.  The patient's grandmother state that she was toxic on  Dilantin, and was tried to control her with Mysoline and Depakote alone.  She has not returned to her baseline and received actually just Ativan  in the ER and this was at times drowsier and less responsive than she  normally is.   PAST MEDICAL HISTORY:  1. Cerebral palsy with mental retardation nonverbal and non-      ambulatory.  2. Lennox-Gastaut seizure disorder.  3. PEG tube feeding.   CURRENTLY MEDICATIONS:  1. Dilantin, just restarted.  2. Depakote.  3. Mysoline.  4. She is also taking Zantac.   ALLERGIES:  She has no known drug allergies, but has difficulty  tolerating DILANTIN due to frequent elevated blood levels.   FAMILY HISTORY:  Positive for hypertension and diabetes.   SOCIAL  HISTORY:  The patient lives with her grandmother.  Goes to a  Psychiatric nurse.   REVIEW OF SYSTEMS:  Difficult to assess.  I rely on the grandmother's  explanation as the patient herself is nonverbal.   PHYSICAL EXAMINATION:  VITAL SIGNS:  Blood pressure is only 102/70,  pulse rate is 120, O2 saturation is 98% on room air, and respiration is  22.  HEENT:  The patient is not normocephalic and has left eye deviation and  a slightly misshaped skull.  She is atraumatic looking.  She has  gingival hyperplasia.  She has extrophy on the left face, otherwise  looks symmetric.  HEART:  Regular, tachycardic.  LUNGS:  Clear to auscultation, but the patient this moaning and seemed  to try to clear her throat, which is not working well for her.  I do,  however, not detect any rhonchi.  ABDOMEN:  Soft and shows no inflammation around the PEG tube.  She has  good peripheral pulses.  LOWER EXTREMITIES:  Significant  muscle atrophy.  The patient has never  walked. She has increased tones with contractures in both upper  extremities.  She has a decreased tone in her lower extremities.  SENSORY:  The patient can only respond to painful stimuli at this point  and again this is not a normal mental status, but she is also in general  not able to respond __________. Gait function and cerebellar function  are not to be assessed as the patient is not ambulatory at baseline.   CT of the head showed no acute findings.   White blood cell count is 81,000, H&H 13.4 with 13,000 neutrophils  elevated at 79%, eosinophils normal, monocytes normal, and lymphocytes  normal.  Sodium 142, potassium 3.6, chloride 105, BUN is 8, and  creatinine 0.6.  Nitric acid level was 85 mcg/mL.  Dilantin was  nondetectable at that time.  Again, the dosing had just started and she  had 2 doses, one yesterday evening and one this morning.   PLAN:  Plan is to obtain urinalysis and to rule out any infection that  might have  precipitated this new re-onset of seizures.  I would like the  patient to be admitted to the hospitalist team that has admitted her  over the last 2 years and follow in consult from Neurology.   The grandmother had heard of a new drug used in Lennox-Gastaut syndrome  and would like to discuss its possible use in her granddaughter with Dr.  Sharene Skeans, or another neurologist, Melvyn Novas, M.D.   I recommend admission telemetry [address] [place].      Melvyn Novas, M.D.  Electronically Signed     CD/MEDQ  D:  08/18/2007  T:  08/19/2007  Job:  [id number]

## 2010-09-09 NOTE — Discharge Summary (Signed)
NAME:  Megan [name], Megan [name]                  ACCOUNT NO.:  [id number]   MEDICAL RECORD NO.:  [id number]          PATIENT TYPE:  INP   LOCATION:  3030                         FACILITY:  [facility]   PHYSICIAN:  Marlan Palau, M.D.  DATE OF BIRTH:  [birth year]   DATE OF ADMISSION:  01/30/2008  DATE OF DISCHARGE:  02/12/2008                               DISCHARGE SUMMARY   ADMISSION DIAGNOSES:  1. History of intractable seizure disorder with recent recurrence.  2. Mental retardation with cerebral palsy.  3. Recent pneumonia.   DISCHARGE DIAGNOSES:  1. Recurrent pneumonia that is now resolved.  2. Intractable seizure disorder with recent recurrence.  3. Severe mental retardation and cerebral palsy.  4. Deep vein thrombosis, right lower extremity.  5. Cortical blindness.   PROCEDURE DURING THIS ADMISSION:  1. Central line insertion, right femoral vein.  2. Percutaneous endoscopic gastrostomy tube insertion.   COMPLICATIONS OF THE ABOVE PROCEDURES:  DVT involving the right lower  extremity associated with a central line placement.   HISTORY OF PRESENT ILLNESS:  Megan [name] is a 30 year old black female  born on 14-Oct-1980, with a history of intractable seizures, mental  retardation, cerebral palsy, Lennox-Gastaut syndrome, and cortical  blindness.  This patient is followed by Dr. Sharene Skeans through our group  and is cared for at home by her grandmother, [name].  The patient  had recently been in the hospital in September 2009 with pneumonia and  was treated with IV antibiotics including vancomycin, Zosyn, and  Zithromax.  The patient returns to the hospital with 13 seizures on the  day of admission with a low-grade fever of 100.5.  The patient was  admitted for the seizure events, but was noted to have posterior rales  and rhonchi in the right posterior lung fields.  The patient was treated  with Ativan with cessation of the seizures.   PAST MEDICAL HISTORY:  Significant for:  1.  History of intractable seizures with recent recurrence.  2. Mental retardation and cerebral palsy.  3. Cortical blindness.  4. History of Dilantin toxicity in September 2009.  5. Recent pneumonia with recurrent pneumonia on this admission.  6. PEG tube placement.  7. DVT, right lower extremity.   MEDICATIONS PRIOR TO ADMISSION:  1. Mysoline 125 mg twice daily.  2. Depakene 650 mg q.i.d.  3. Dilantin 100 mg 3 times daily.  4. Lopressor 25 mg twice daily.  5. Jevity 300 mg q.6 h.  6. Zantac 75 mg twice daily.   The patient has no known allergies.   Does not smoke or drink.   Please refer to history and physical dictation summary for social  history, family history, review of systems, and physical examination.   LABORATORY VALUES:  Notable for INR on the day prior to discharge of  2.3.  INR on the day of discharge is pending.  The patient currently is  on 2.5 mg at night of Coumadin with erratic INR values.  White count of  6.1, hemoglobin of 10.4, hematocrit of 31.7, MCV of 100.3, and platelet  level of 327.  Sodium 137, potassium 3.8, chloride of 101, CO2 of 31,  glucose 71, BUN of 5, and creatinine of 0.37.  Total bili 0.3, alk  phosphatase of 75, SGOT of 24, SGPT of 18, total protein 7.2, albumin of  2.7, and calcium of 9.1.  Phenytoin level 16.4.  Valproic acid level  77.4.  Phenobarbital level 17.2.  Primidone level less than 2.   Chest x-ray done on January 31, 2008, shows right lower lobe  atelectasis/pneumonia.   HOSPITAL COURSE:  This patient was admitted for evaluation of seizures.  The patient was noted to have rhonchi posteriorly in the right lung  field and was treated with IV antibiotics including Zosyn and Avelox.  This patient responded very rapidly to the IV antibiotic therapy and was  treated for total of 7-8 days.  The antibiotics were discontinued.  The  patient was treated with her usual doses of the anticonvulsants and has  done well with seizures with  only an occasional seizure during this  admission.  The patient, however, developed some swelling of the right  leg where the central line was placed in the right femoral vein.  The  patient had been on Lovenox during this admission.  Lovenox was  transiently held for a PEG tube placement as her gastrostomy tube came  out.  The patient was seen by Gastroenterology for this.  The patient  has had tube feeds resumed and the PEG site looks good.  The patient has  had been placed on Coumadin and the INR values have been somewhat  erratic.  Plans are to discharge the patient to home if the INR today is  in the therapeutic range.  INR was 2.3 on the day prior to discharge.  The swelling of the right lower extremity has resolved.  The patient may  have a urinary tract infection with the Foley catheter in place.  This  will be checked prior to discharge.  Plans are to discharge this patient  to home, taking Depakene 500 mg per 10 mL and 13 mL every 6 hours;  metoprolol 25 mg per 10 mL, 10 mL twice daily; Zantac 75 mg 5 mL twice  daily; Mysoline 250 mg tablet one half twice daily per PEG tube;  Dilantin 100 mg per 4 mL, 4 mL three times daily; Jevity 1.2, 300 mL  every 6 hours; and Coumadin dosing could be determined by pharmacy prior  to discharge.  We will need follow up protime in 3 days.  The patient  will follow up with Dr. Sharene Skeans in 4-6 weeks.  Again, seizures have  done quite well.      Marlan Palau, M.D.  Electronically Signed     CKW/MEDQ  D:  02/12/2008  T:  02/12/2008  Job:  [id number]   cc:   [facility]

## 2010-09-09 NOTE — Consult Note (Signed)
NAME:  Megan [name], Megan [name]                  ACCOUNT NO.:  [id number]   MEDICAL RECORD NO.:  [id number]          PATIENT TYPE:  INP   LOCATION:  3010                         FACILITY:  [facility]   PHYSICIAN:  Casimiro Needle L. Reynolds, M.D.DATE OF BIRTH:  04/09/1981   DATE OF CONSULTATION:  01/20/2008  DATE OF DISCHARGE:                                 CONSULTATION   REQUESTING PHYSICIAN:  Fransisco Hertz, M.D.   REASON FOR EVALUATION:  Seizures and medication management.   HISTORY OF PRESENT ILLNESS:  This is an inpatient consultation and  evaluation of an existing [facility] patient.  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  She has a long history of intractable seizures along  with multiple bouts of aspiration pneumonia and was intubated with  respiratory failure in May of this year.  She was brought in the  hospital on September 22 under the teaching service with fever and  suspected recurrent aspiration pneumonia.  She has been treated with  antibiotics including vancomycin, Zosyn, and Zithromax.  She had her  levels checked on admission and her phenytoin level was markedly  elevated at 41.  Since admission, her phenytoin has been held.  She has  remained on her usual dose of Depakote and Mysoline.  Neurological  consultation was requested regarding the above.  It is not clear that  she has had any change in her seizures.  Her mother stated on admission  that she was having 2-4 small seizures on a daily basis.  She has not  had any major motor seizures here in the hospital.   PAST MEDICAL HISTORY:  Remarkable for spastic quadriplegia, cerebral  palsy, and severe mental retardation as above.   FAMILY/SOCIAL/REVIEW OF SYSTEMS:  Per admission H and P by the teaching  service on  January 17, 2008.   MEDICATIONS:  Prior to admission, she was taking Mysoline 125 mg b.i.d.,  Depakene 650 mg q.i.d. and Dilantin 100 mg t.i.d.  She is also taking  Lopressor, Jevity, and Protonix.   PHYSICAL EXAMINATION:  VITAL SIGNS:  Temperature 97.0, blood pressure  120/89, pulse 81, respirations 16, O2 saturation 100% on 2 liters O2  nasal cannula.  GENERAL:  This is a chronically ill-appearing woman supine on hospital  bed, in no acute distress.  HEENT:  Normocephalic and atraumatic. Oropharynx benign.  NECK:  Supple without carotid bruits.  HEART:  Regular rate and rhythm without murmurs.  NEUROLOGICAL:  Mental status:  She is drowsy, but alerts to voice.  She  does not really regard the examiner.  She makes no efforts at speech and  does not follow commands.  Cranial nerves:  Pupils are equal and  reactive.  Extraocular movements are full with a skew deviation.  She  does not blink to threat from either side.  Face, tongue, and palate,  she moves normally and symmetrically.  She has protuberant tongue.  Motor, normal bulk with  increased tone in the lower extremities.  She is  able to move her upper extremities against gravity, does not move her  lower extremities very much.  Sensory, she withdraws all of the  extremities to painful stimulus.  She cannot cooperate with  coordination, gait testing.  Reflexes are brisk throughout.  Toes are  upgoing bilaterally.   LABORATORY REVIEW:  CBC from January 19, 2008, white count 10.5, which  is coming down, hemoglobin 10.0, platelets 199,000, BMET  remarkable for  an elevated glucose of 160, otherwise normal.  She had normal liver  functions yesterday.  She is running albumins in the 2.4-2.6 range.  On  admission, gentamicin level 31.7, which is therapeutic.  Valproate level  was 72.4 in a therapeutic range and Dilantin level of 41.7, which is  significantly over the normal therapeutic range.  She has not had any  neuro imaging  during this admission.   IMPRESSION:  1. Intractable epilepsy, doing pretty well at present.  2. Elevated phenytoin level, likely toxic given the low albumin and      the competition going on for binding sites with the other      medication.   PLAN:  Suggest rechecking the level and resuming the medication if it is  within the normal 10-20 range.  She will need a medication at discharge.  I recommended that she follow up with Dr. Sharene Skeans in the office as  scheduled.   Thank you for this consultation.      Michael L. Thad Ranger, M.D.  Electronically Signed     MLR/MEDQ  D:  01/20/2008  T:  01/20/2008  Job:  [id number]

## 2010-09-09 NOTE — Consult Note (Signed)
NAME:  Megan [name], Megan [name]                  ACCOUNT NO.:  [id number]   MEDICAL RECORD NO.:  [id number]          PATIENT TYPE:  INP   LOCATION:  1831                         FACILITY:  [facility]   PHYSICIAN:  Bevelyn Buckles. Champey, M.D.DATE OF BIRTH:  1980-12-28   DATE OF CONSULTATION:  DATE OF DISCHARGE:                                 CONSULTATION   REQUESTING PHYSICIANS:  Dr. Sherrie Mustache and Dr. Estell Harpin.   REASON FOR CONSULTATION:  Seizures.   HISTORY OF PRESENT ILLNESS:  Megan [name] is a 30 year old African American  female with past medical history of seizures, Lennox-Gastaut syndrome,  cerebral palsy, and mental retardation, who presents to [facility]  Emergency Room with history of recurrent seizures today.  Megan [name]  states she had 16 seizures today at her daycare.  She has a history of  recurrent seizures and is followed by Dr. Sharene Skeans.  Megan patient's  seizures consist of general shaking, jerking, incontinence and tongue  biting.  She is currently on Depakote and Dilantin.  She has had  multiple seizures today.  She is now back to her current baseline as per  Megan [name].  Megan chart is reviewed.   PAST MEDICAL HISTORY:  Notable for CPMR, Lennox-Gastaut, and seizures.   CURRENT MEDICATIONS:  Dilantin, Depakote, Mysoline, and Zantac.   ALLERGIES:  No known drug allergies.   FAMILY HISTORY:  Positive for hypertension and diabetes.   SOCIAL HISTORY:  Megan patient lives with her Megan [name], goes to  daycare.   REVIEW OF SYSTEMS:  Unable to assess secondary to CPMR.   EXAMINATION:  VITALS:  Blood pressure 90/69, pulse 96, O2 sat is 100%,  respirations 20.  HEENT:  Normocephalic, atraumatic.  Patient has gingival hyperplasia and  bilateral exotropia.  Face looks symmetric.  HEART:  Regular.  LUNGS:  Clear.  ABDOMEN:  Soft.  EXTREMITIES:  Good pulses.  NEUROLOGICAL:  Megan patient is awake, nonverbal, not following commands.  CRANIAL NERVES :  Megan patient has bilateral exotropia, resists  pupil  examination.  Face is symmetric.  MOTOR:  Patient had minimal movements in extremities.  Has increased  tone with contractures.  SENSORY:  Patient responding appropriately to pain in her extremities.  Gait and cerebellar function are unable to assess at this time.   CT of Megan head showed no acute  findings.  There is sphenoid sinus  inflammation.   LABS:  WBC is 5.1, hemoglobin 14.6, hematocrit 43, platelets 275.  Valproic acid level is 72.  Dilantin is 19.6.  Sodium is 137, potassium  4.0, chloride 104, CO2 33, BUN 10, creatinine 0.7, glucose 73.   IMPRESSION:  This is a 30 year old with recurrent seizures with  therapeutic Dilantin and Depakote.  We will increase Megan Depakote level  to 10 ml t.i.d. as there is some room to increase her medications, still  allowing her to be in therapeutic range.  We will recheck her level in 1-  2 days.  Check an EEG in Megan morning and use Ativan p.r.n.  We will  consider adding another divalproex medication if patient continues to  have recurrent seizures or after these adjustments.  We will follow Megan  patient while she is in [facility] hospital.      Bevelyn Buckles. Nash Shearer, M.D.  Electronically Signed     DRC/MEDQ  D:  05/02/2007  T:  05/03/2007  Job:  [id number]

## 2010-09-09 NOTE — Procedures (Signed)
EEG NUMBER:  [id number].   CLINICAL HISTORY:  The patient is a 30 year old with quadriparesis,  mental retardation, and recurrent seizures.  The patient had a cluster  of 15 seizures the day of admission.  The patient typically averages one  to three seizures per month.  These were a mixture of generalized tonic-  clonic, atypical absence, and myoclonic seizures.  Study is being done  to look for the change in background  (345.11, 345.01, 333.2).   PROCEDURE:  The tracing is carried out on a 32-channel digital Cadwell  recorder reformatted into 16-channel montages with 1 devoted to EKG.  The patient was awake during the recording.  She takes Depakene,  Dilantin, Mysoline, Zantac and has received morphine as well.  The  International 10/20 system of lead placement was used.   DESCRIPTION OF FINDINGS:  The dominant frequency is a 5 Hz, 25-30  microvolt theta range activity that is present when the patient appears  to be awake.  The record began with the patient in the drowsy state with  2-4 Hz, 25-50 microvolt delta range activity and multifocal sharp waves  involving bifrontal and right greater than left temporal regions that  are both independent and occasionally synchronous.   The patient had a single clinical and electrographic seizure lasting for  several seconds during which time the patient had blinking in turning of  the head.  This was associated with a generalized central and temporal  greater than frontal right greater than left 3 per second spike and slow  wave discharge.   The patient had a 2-second and a 4-second episode that were of similar  distribution without clinical accompaniments.   EKG showed a regular sinus rhythm with ventricular response of 90 beats  a minute.   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   The  background shows diffuse slowing which is a nonspecific indicator of  neuronal dysfunction that in this case is related to the patient's  underlying static encephalopathy.  The findings are less consistent with  Lennox-Gastaut than an underlying static encephalopathy with multifocal  sharp waves.      [name]. Sharene Skeans, M.D.  Electronically Signed     ZOX:WRUE  D:  05/04/2007 15:14:04  T:  05/04/2007 16:48:44  Job #:  [id number]   cc:   [name]. Sharene Skeans, M.D.  Fax: [phone]

## 2010-09-09 NOTE — Discharge Summary (Signed)
NAME:  Megan [name], Megan [name] NO.:  [id number]   MEDICAL RECORD NO.:  [id number]          PATIENT TYPE:  INP   LOCATION:  3032                         FACILITY:  [facility]   PHYSICIAN:  Melvyn Novas, M.D.  DATE OF BIRTH:  10/14/1980   DATE OF ADMISSION:  08/18/2007  DATE OF DISCHARGE:  08/19/2007                               DISCHARGE SUMMARY   This 30 year old African-American female is severely impaired, mentally  retarded, nonverbal, nonambulatory due to what her grandmother described  as severe fever and seizure disorder starting at 72 months of age.  She  believes that the child was normal at the time of her birth and was a  product of a normal pregnancy.  However, the patient has resided with  grandmother and the maternal aunt since infancy.  The patient is known  to have Lennox-Gastaut syndrome and had recently developed more frequent  seizures.  The Dilantin level obtained in our office was 31 and Dilantin  toxicity was suspected to increase the seizure frequency.  The patient  has been put on a Dilantin hold.  Dilantin had not been resumed for the  last 3 weeks until August 17, 2007, and when the patient arrived here at  the hospital, she had suffered a total of about 15 seizures some at  [employer] some at home.  The patient has now received 4 doses of Dilantin  and will receive a fifth one before she leaves for home.  I have not  changed her other medications which are as follows, Depakote 250 mg/5  mL.  She gets 10 mL 3 times a day.  Primidone 125 mg a half of tablet  crushed given twice a day per peg tube, Dilantin 125 mg/5 mL suspension  given twice a day, and Jevity 3 cans at 10 a.m., 3 cans of 14 hours, and  1 can of 16 hours 30 minutes and at 21 hours.  She is also on Zantac via  tube a 5 mL dose which is equivalent to 75 mg.  She takes Zantac twice  daily.   The patient had a normal white blood cell count.  There was no evidence  of an infection increasing  her seizure frequency.  I presume that the  Dilantin holiday and the now undetectable Dilantin levels were  responsible for some of her exacerbations.  She is to follow up with Dr.  Sharene Skeans and nurse practitioner, Darrol Angel after her discharge from  the hospital preferably within the first 2 weeks after her discharge.  The patient's grandmother would also like to discuss rufinamide a new  drug used in Lennox-Gastaut seizure treatment at her visit.   Written discharge orders are present in the chart.  There are no new  prescriptions in the chart as the patient's medication has been resumed  to the same level which she took 3 weeks ago.      Melvyn Novas, M.D.  Electronically Signed     CD/MEDQ  D:  08/19/2007  T:  08/19/2007  Job:  [id number]

## 2010-09-09 NOTE — H&P (Signed)
NAME:  Megan [name], Megan [name] NO.:  [id number]   MEDICAL RECORD NO.:  [id number]          PATIENT TYPE:  EMS   LOCATION:  MAJO                         FACILITY:  [facility]   PHYSICIAN:  Elliot Cousin, M.D.    DATE OF BIRTH:  November 04, 1980   DATE OF ADMISSION:  05/02/2007  DATE OF DISCHARGE:                              HISTORY & PHYSICAL   PRIMARY CARE PHYSICIAN:  The patient is unassigned.   PRIMARY NEUROLOGIST:  Dr. Ellison Carwin.   CHIEF COMPLAINT:  Multiple seizures at the daycare.   HISTORY OF PRESENT ILLNESS:  The patient is a 30 year old woman with a  past medical history significant for seizure disorder, mental  retardation, cerebral palsy, and Lennox-Gastaut syndrome.  She is  followed chronically by neurologist Dr. Sharene Skeans for her seizure  disorder.  The patient is profoundly mentally retarded and therefore  cannot provide a history.  The history is provided by her grandmother,  Ms. Young.  According to her grandmother, the patient was at the adult  daycare this morning.  She was called by the staff apparently after the  patient had multiple seizures.  She was told that the patient had at  least 16 short-term seizures back to back between the hours of 11 a.m.  and noon.  According to Ms. Young, no one described the seizures,  however, the patient's seizures are usually manifested by a tonic-clonic  shaking of her arms and legs, her eyes tend to roll backward, and she  usually has twitching of her mouth.  She also usually has urinary  incontinence.  The patient did not appear to be incontinent of her  bowels or her urine when Ms. Young picked her up.  She did note that the  patient was somewhat sleepy which is usually the case after she has  seizures.  There was no apparent head trauma.  The patient has not  missed any medications.  There has been no recent fever, chest  congestion, diarrhea, painful urination, or any other acute illness.  The patient's last  menstrual period was last week and she usually has at  least 2 seizures during the week of her menstrual period.  In general,  the patient experiences one to two seizures per week.   During the evaluation in the emergency department, the patient is noted  to be afebrile and hemodynamically stable.  She has not had any seizures  during her several hour stay in the emergency department.  Her lab data  are significant for a phenantoin of 19.6 and a valproic acid level of  72, both within normal limits.  A CT scan of the head and electrolytes  are pending.  The patient will therefore be admitted for further  evaluation and management.   PAST MEDICAL HISTORY:  1. Lennox-Gastaut syndrome  2. Seizure disorder.  3. Mental retardation.  4. Cerebral palsy.  5. History of pneumonia in 2001.  6. History of ileus in 2001.  7. Replacement of the gastrostomy tube by Dr. Levie Heritage in August of      2007.  8. Dysphagia,  status post PEG tube insertion, originally placed in the      1990s.  9. Do Not Resuscitate -- No Code Blue per the patient's primary      caretaker, Mrs. [name], her grandmother.   MEDICATIONS:  1. Depakote 1 tsp and a half 6 a.m., 10 a.m., and 2 p.m.  2. Dilantin 1 tsp twice daily.  3. Zantac liquid every 12 hours.  4. Primidone 1/2 tsp twice daily.  5. Jevity 3 cans at 10 a.m., 3 cans at 2 p.m., 1 can at 4:30 p.m. and      1 can at 9 p.m.   ALLERGIES:  No known drug allergies.   SOCIAL HISTORY:  The patient is single.  She has no children.  She is  totally disabled and totally dependent on her activities of daily  living.  She is wheelchair bound.  Her primary caretaker is her  grandmother, Mrs. [name].  The patient has no history of tobacco,  alcohol, or illicit drug use.   FAMILY HISTORY:  Her mother is 50 years of age and has diabetes.  The  health and whereabouts of her father are unknown.   EXAM:  Temperature afebrile, blood pressure 96/69, pulse 96,  respiratory  rate 20, oxygen saturation 100% on room air.  IN GENERAL:  The patient is a pleasant, alert, disabled appearing 30-  year-old African-American woman who is currently lying in bed in no  acute distress.  HEENT:  Head is normocephalic and atraumatic.  Pupils equal, round and  reactive to light.  Extraocular movements are intact.  Conjunctiva are  clear, sclerae are white.  Oropharynx reveals moist mucous membranes,  hypersalivation.  Her gums are hypertrophied.  The posterior pharynx was  difficult to examine as the patient was not cooperative.  I did not see  any obvious lacerations on her tongue.  NECK:  Supple, no adenopathy, no thyromegaly, no bruit, no JVD.  LUNGS:  Decreased breath sounds in the bases, otherwise clear.  HEART:  S1, S2 with no murmurs, rubs or gallops.  ABDOMEN:  Centrally placed PEG tube without surrounding erythema or  tenderness.  Positive bowel sounds, soft, nontender, nondistended.  No  hepatosplenomegaly, no masses palpated.  EXTREMITIES:  Pedal pulses 2+ bilaterally.  No Pretibial edema and no  pedal edema.  NEUROLOGIC:  The patient is alert and not oriented.  She is profoundly  mental retardation.   ADMISSION LABORATORIES:  WBC 5.1, hemoglobin 14.6, platelets 275,  valproic acid level 72, phenantoin level 19.6.   ASSESSMENT:  1. Reported history of multiple seizures today at the adult daycare.      The patient apparently has been seizure free in the emergency      department so far.  According to her grandmother, the patient has      been receiving her anticonvulsants as previously prescribed.  2. Dysphagia secondary to profound mental retardation and cerebral      palsy.  The patient is chronically nourished with Jevity.   PLAN:  1. The patient will be admitted for further evaluation and management.  2. Dr. Nash Shearer, neurologist, has been consulted.  3. We will continue Depakene, Dilantin, and Primidone as previously      ordered.   However, further adjustments will be made per the      recommendations of Dr. Nash Shearer.  Of note, the patient's reported      dosing of Depakote is somewhat different from the last consultation  note in 2007.  The patient's grandmother was unclear about the      exact milligrams of the medications and she was only able to      provide the information via teaspoons.  4. We will order p.r.n. IV Ativan for seizure activity.  5. The patient will be placed on seizure precautions.  6. For further evaluation, we will check a CT scan of the head, EKG,      and EEG.  We will also assess her      electrolytes and thyroid function.  7. We will check an urinalysis only if she becomes febrile.  8. No Code Blue/DNR as discussed with her primary caretaker, Mrs.      Maple Hudson.      Elliot Cousin, M.D.  Electronically Signed     DF/MEDQ  D:  05/02/2007  T:  05/02/2007  Job:  [id number]   cc:   [name]. Sharene Skeans, M.D.

## 2010-09-09 NOTE — H&P (Signed)
Megan [name], [name]                  ACCOUNT NO.:  [id number]   MEDICAL RECORD NO.:  [id number]          PATIENT TYPE:  INP   LOCATION:  3009                         FACILITY:  [facility]   PHYSICIAN:  Megan [name], M.D.DATE OF BIRTH:  [birth year]   DATE OF ADMISSION:  02/16/2008  DATE OF DISCHARGE:                              HISTORY & PHYSICAL   CHIEF COMPLAINTS:  Coumadin toxicity.   HISTORY OF PRESENT ILLNESS:  A 30 year old with intractable mixed  seizure disorder, Laennec's gastroencephalopathy syndrome,  quadriparesis, disconjugate eye movements, and dysphagia.   The patient had a recent 14-day hospitalization for breakthrough  seizures complicated by a right leg deep vein thrombosis from a right  femoral vein catheter that was placed for IV access.  The patient was  discharged on Coumadin on February 13, 2008, with an INR of 2.8, which  had risen from 1.8 the day before and had dropped from 2.3 the day  before that.  Two days later, it is now 8.1 and the prothrombin time is  81.7.  Fortunately, the patient has no evidence of bruising in her skin  or internal bleeding or melena.  The patient is admitted to reverse  Coumadin toxicity.  There is no history of seizures since discharge.  I  called a cousin who seemed unaware of her current clinical situation.  I  am not able to reach her grandmother.  No one is present to provide a  history, therefore review of systems is unobtainable.   MEDICATIONS ON DISCHARGE:  1. Mysoline 250 mg one half p.o. b.i.d. crushed and placed in her      percutaneous gastrostomy tube.  2. Depakene 250 mg per 5 mL, 13 mL per gastrostomy q.i.d.  3. Dilantin 125 mg per 5 mL, 4 mL per gastrostomy 3 times daily.  4. Lopressor 10 mg twice daily (10 mL per dose) ?  5. Zantac 75 mg (5 mL) twice daily.  6. Jevity 300 mg every 6 hours.   DRUG ALLERGIES:  None known.   FAMILY HISTORY:  Diabetes in her mother.  No history of epilepsy, mental  retardation,  cerebral palsy, blindness, deafness, or autism.   SOCIAL HISTORY:  The patient lives with her grandmother, Megan [name].  She is fully dependent for all activities of daily living.  There is no  use of tobacco, alcohol, or secondhand smoke exposure.  She attends  [employer].   PHYSICAL EXAMINATION:  VITAL SIGNS:  Today, temperature 97.5, blood  pressure 112/76, resting pulse 97, respirations 18, oxygen saturation  97% on room air.  EAR, NOSE, AND THROAT:  No infections. No bruits.  HEAD:  She is microcephalic.  LUNGS:  Clear.  HEART:  No murmurs.  Pulse is normal.  ABDOMEN:  Soft.  Bowel sounds normal.  No hepatosplenomegaly.  EXTREMITIES:  Contractures of the wrists, hips, and knees.  She has  tight heel cords.  Her legs are now equal in size.  Before, the right  leg was much larger.  NEUROLOGIC:  Awake, no language.  She is mute.  Cranial nerves; round  reactive pupils, disconjugate eye movements.  She fixes and follows on  objects and also light.  She has symmetric lower facial weakness with  drooling and midline tongue that protrudes slightly.  Motor examination,  spastic quadriparesis.  She moves her arms more so than her legs.  She  has very poor fine motor movements.  Sensation withdrawal x4.  Cerebellar, no tremor.  Deep tendon reflexes normal except at the  ankles, which are absent.  She had bilateral extensor plantar responses.   IMPRESSION:  1. Warfarin toxicity.  2. Megan [name] encephalopathy with quadriparesis, retardation,      intractable mixed seizures.   PLAN:  1. Discontinue Coumadin.  2. Vitamin K 5 mg IV.  3. Fresh frozen plasma 2 units IV.  4. No change in any epileptic drugs.  5. We will adjust her Coumadin to produce a prothrombin time of      approximately 2.5.  It is clear that she is very brittle and will      be difficult to titrate.  We will also have difficulty obtaining IV      access if we have a similar situation present in  the recent      hospitalization.   I appreciate the opportunity to participate in her care.      Megan [name], M.D.  Electronically Signed     [facility]/MEDQ  D:  02/16/2008  T:  02/17/2008  Job:  [id number]

## 2010-09-09 NOTE — Discharge Summary (Signed)
Megan [name], Megan [name]                  ACCOUNT NO.:  [id number]   MEDICAL RECORD NO.:  [id number]          PATIENT TYPE:  INP   LOCATION:  2107                         FACILITY:  [facility]   PHYSICIAN:  Melvyn Novas, M.D.  DATE OF BIRTH:  [birth year]   DATE OF ADMISSION:  08/29/2007  DATE OF DISCHARGE:                               DISCHARGE SUMMARY   PRELIMINARY DISCHARGE:  This patient is known to have severe mental retardation . She has been  followed for intractable seizures since infancy, onset of seizures  reportedly at at 59 months of age.  There was a question of possible cerebral palsy versus a relation to a  febrile illness starting the seizure s, grand mother stated she was fine  right after birth until the febrile illness, possibly encephalitis or  meningitis.  The patient is nonverbal at baseline, has never learned to walk.  She  developed Lennox-Gastaut seizures.  This has been her diagnosis for over  two decades.  This meanwhile 30 year old African-American female that lives in her  grandmothers private home.  Her main caretaker is her grandmother.  She  attends the [employer].   The patient had frequent seizures leading to her re-admission on Aug 29, 2007, after being home for only 7 days after a previous hospital  admission.  She is now showing a very high Dilantin level , presumed to be toxic,  after having resume the previous regimen of 300 mg a day.  Dilantin was  therefor discontinued this Friday and she was switched to Keppra  yesterday by Dr. Pearlean Brownie.  Her R.N. reports hourly seizures for this weekend and her grandmother  requested time to talk to Dr. Sharene Skeans about a new drug for [employer] syndrome, a request she had voiced twice before.     [name] has not yet responded to the Keppra which is given at 250 mg q.6  h.    Ativan had been given frequently.  She is currently in the Intensive  Care Unit.  Dilantin level was 19 prior to being discontinued.  The  patient has tested at this time positive for the Haemophilus  influenza bacteria.  The test was positive on Sep 01, 2007, and we  suspect that the ongoing seizure activity at this time is related to an  infection, but the patient is currently not febrile.    While she was vomiting at home, she has not been vomiting here, and  she had rather slow bowel movements which should allow her to absorb  medications through her PEG tube.   CURRENT MEDICATIONS:  Here given are;  1. Rocephin 1 g daily for 6 days.  2. Keppra 250 mg q.6 h.  I have today increased her night dose to 500      mg and again she was on PEG tube Keppra.  I would like her to receive this IV.  We will give her potassium supplement, as her potassium was only 3.2.  She had been on 20 mEq b.i.d.  We will put her on 40 mEq per PEG at  night.  If this does not work, I will also ask to have potassium added to her  maintenance IV.  She will continue the phenobarbital at her home regimen  dose.    Her Depakote was therapeutic and can be continued at 500 mg q.6 h. IV  or per PEG.   I have ordered a B-MET for the morning, increased her Keppra night dose,  and if no regular bowel movement take place, suggested we change it from  PEG tube to IV.   I learned that the patient was a full code for this admission. She was a  DNR last month.      Melvyn Novas, M.D.  Electronically Signed     CD/MEDQ  D:  09/04/2007  T:  09/04/2007  Job:  [id number]   cc:   [name]. Sharene Skeans, M.D.  Darrol Angel, RN

## 2010-09-09 NOTE — Discharge Summary (Signed)
NAME:  Megan [name], Megan [name]                  ACCOUNT NO.:  [id number]   MEDICAL RECORD NO.:  [id number]          PATIENT TYPE:  INP   LOCATION:  4705                         FACILITY:  [facility]   PHYSICIAN:  Mobolaji B. Bakare, M.D.DATE OF BIRTH:  March 07, 1981   DATE OF ADMISSION:  05/02/2007  DATE OF DISCHARGE:  05/05/2007                               DISCHARGE SUMMARY   PRIMARY CARE PHYSICIAN:  Unassigned.   NEUROLOGIST:  [name]. Sharene Skeans, M.D.   FINAL DIAGNOSES:  1. Multiple episodes of seizure activity.  2. Dislodged percutaneous endoscopic gastrostomy tube (replaced).   SECONDARY DIAGNOSES:  1. Recurrent seizures.  2. Lennox-Gastaut syndrome.  3. Mental retardation.  4. Cerebral palsy.  5. History of ileus in 2001.  6. History of pneumonia in 2001.  7. Dysphagia status post percutaneous endoscopic gastrostomy tube      placement.  8. Do not resuscitate/no code blue.   PROCEDURES:  1. EEG done on May 04, 2007 was abnormal showing multifocal spikes      and slow wave superimposed on diffuse background slowing.  One      brief episode of generalized electroencephalographic seizure      lasting 8 seconds with high blinking and head turning to the left      during the evaluation.  She has two other episodes      electroencephalographically without clinical symptoms during the      EEG.  2. Head CT scan done on May 02, 2007 showed no acute intracranial      abnormalities, sphenoid sinus inflammation.  3. Abdominal x-ray done on May 04, 2007 for placement of PEG.      Opacification of the indwelling PEG tube with water soluble      contrast appears to fill only the stomach without apparent      extravasation or intraperitoneal spread.   BRIEF HISTORY:  Ms. Gancarz is a 30 year old African American female with  history of Lennox-Gastaut syndrome, recurrent seizure disorder, cerebral  palsy, mental retardation who lives at home with her grandmother.  She  presented to the  emergency room with multiple episodes of seizure  activity.  She was evaluated by Bevelyn Buckles. Champey, M.D. in the emergency  room.  The patient was admitted for stabilization.   Prior to hospitalization the patient was on:  1. Depakote 1 teaspoon and 1/2 at 6 a.m., 10 a.m., and 2 p.m.  2. Dilantin 1 teaspoon twice daily.  3. Primidone 1/2 teaspoon twice daily.   Initial Dilantin level was normal, 19.6, actually on the high side of  normal and valproic acid was also normal at 72.  Given the recurrence of  the seizure, Bevelyn Buckles. Champey, M.D. opted to increase the dose of  Depakote to 10 mL t.i.d. still allowing not to be within the therapeutic  range.  The follow up Depakene level after 48 hours was 56.5.  The  patient was seizure free during the course of hospitalization, except  the  seizure activity seen during EEG evaluation at the bedside.  It is  possible that the patient could have  had one or two episodes of seizures  of subclinical seizures during the course of hospitalization because  while on telemetry she had tachycardia with episodes of tachycardia in  the 150 on one occasion.  However, this were not collaborated with  clinical seizures on evaluation at that time.  Upon review of EEG  report, [name]. Hickling, M.D. found that we cannot possibly control  all atypical absence seizures.  It is noted that the patient has Lennox-  Gastaut syndrome.   Her PEG tube came out during the course of hospitalization.  This was  replaced and was followed up with a water soluble contrast for  confirmation.  Placement was confirmed to be appropriate.   The patient was stable enough for discharge.  She appears to be at her  baseline physically and neurologically.  The patient is not verbal.  She  sometimes grimaces.   DISCHARGE MEDICATIONS:  1. Depakote 250/5 mL (250 mg/5 mL) 10 mL at 6 a.m., 10 a.m. and 2 p.m.  2. Zantac 75 mg two times a day via PEG.  3. Primidone 125 mg two times a  day via PEG.  4. Phenytoin 125 mg two times a day via PEG.  5. Jevity 3 cans at 10 a.m., 3 cans at 2 p.m., 1 can at 4:30 p.m., 1      can at 9 p.m.  6. Free water 100 mL every 6 hours.   FOLLOW UP:  [name]. Hickling, M.D. in 6 months or sooner should there  be frequent recurrent seizures.   DISCHARGE LABORATORY DATA:  Valproic acid  56.5.  Dilantin 19.6.  TSH  4.666.  Hemoglobin A1c 4.5.  Sodium 138, potassium 3.9, chloride 105,  bicarb 28, glucose 81, BUN 6, creatinine of 0.53.  White cell 4.5,  hemoglobin 12.1, hematocrit at 5.8, platelets 221.  AST 41, ALT 45.      Mobolaji B. Corky Downs, M.D.  Electronically Signed     MBB/MEDQ  D:  05/05/2007  T:  05/05/2007  Job:  [id number]   cc:   [name]. Sharene Skeans, M.D.

## 2010-09-09 NOTE — Discharge Summary (Signed)
Megan [name], Megan [name]                  ACCOUNT NO.:  [id number]   MEDICAL RECORD NO.:  [id number]          PATIENT TYPE:  INP   LOCATION:  3009                         FACILITY:  [facility]   PHYSICIAN:  [name]. Hickling, M.D.DATE OF BIRTH:  1980-05-02   DATE OF ADMISSION:  02/16/2008  DATE OF DISCHARGE:  02/23/2008                               DISCHARGE SUMMARY   ADMITTING DIAGNOSIS:  Coumadin toxicity.   FINAL DIAGNOSES:  1. Coumadin toxicity.  2. Spastic quadriparesis.  3. Cortical blindness.  4. Severe mental retardation.  5. Aspiration pneumonia.  6. Severe dysphagia.  7. Gastric motility disorder.   This is a long term patient of mine, she is now a 30 year old.  She was  admitted with intractable mixed seizure disorder, Laennec's  gastroencephalopathy syndrome, quadriparesis, disconjugate eye movement  and dysphagia.  The patient had a recent 14-day hospitalization for  breakdown seizures complicated by right leg DVT from a right femoral  vein catheter that was placed for IV access.  The patient was discharged  on Coumadin on February 13, 2008, with an INR of 2.8 which has risen from  1.8 the day before and had dropped from 2.3 the day before that.  Two  days later it was 8.1 and the prothrombin time was 81.7.  The patient  was admitted to reverse her Coumadin toxicity at this time.  No history  of seizures was documented on this visit.  At that time no spontaneous  bleeding was noted either.  At time of admission patient was given FFP  and Vitamin K to reverse her Coumadin, INR throughout her hospital stay.  On October 25th it was noted that patient was subtherapeutic, INR of  1.5, goal being 2.3.  This was status post reversal of her Vitamin K.  At that time Coumadin was continued at 5 mg with a followup in the  morning.  On October 26th, the patient's INR level was noted to be 1.6.  The patient had no complications during her hospital stay, neuro  physical exam up to that  point was stable.  The plan at that time was to  discharge patient when her INR was 2.0 and to follow up with phlebotomy  for INR check.  On October 28th, still no seizures were noted during  hospital stay.  Patient's INR had risen to a level of 2.5 and patient  was held for 1 final day prior to being discharged home.  That night  patient was given Coumadin 1.25 mg and INR was followed up the next  morning.  On October 29th, therapeutic goal of INR of 2 was achieved and  goal of allowing patient to be discharged home can now be put into  place.  On physical exam, the patient was sleepy, lungs were clear,  abdomen soft, nontender, spastic quadriparesis, highly mentally  retarded, no seizures noted.   DISCHARGE MEDICATIONS:  The patient will  be discharged on:  1. Dilantin 4 mL via PEG.  2. Valproic acid 13 mL via PEG four times daily.  3. Metoprolol 10 mL via PEG, that  is 25 mg b.i.d.  4. Primidone 1/2 tab 250 mg crushed via PEG b.i.d.  5. Zantac one tsp via PEG, that is b.i.d.  6. Coumadin will be changed to 1.25 mg daily, one dose only.   LAB VALUES AT TIME OF DISCHARGE:  White blood cell count was 4.9,  hemoglobin 10.6 hematocrit 32.7, platelet count was 18.2, PT was 24.3,  INR was 2.0.  Phenytoin level, last taken on the 20th of October, was  20.1.   VITAL SIGNS:  Showed systolic blood pressure to be 295, diastolic being  69, temperature of 97.2, pulse 99, respirations 18.   DISPOSITION:  Patient will follow up with home care for repeat INR and  she will follow up with Dr. Sharene Skeans in approximately 1 month.     ______________________________  Felicie Morn, PA-C      [name]. Sharene Skeans, M.D.  Electronically Signed    DS/MEDQ  D:  02/23/2008  T:  02/23/2008  Job:  [id number]

## 2010-09-12 NOTE — H&P (Signed)
NAME:  Megan [name], Megan [name]                            ACCOUNT NO.:  [id number]   MEDICAL RECORD NO.:  [id number]                   PATIENT TYPE:  INP   LOCATION:  3032                                 FACILITY:  [facility]   PHYSICIAN:  Kela Millin, M.D.             DATE OF BIRTH:  Jul 27, 1980   DATE OF ADMISSION:  11/06/2003  DATE OF DISCHARGE:                                HISTORY & PHYSICAL   CHIEF COMPLAINT:  Recurrent seizures.   HISTORY OF PRESENT ILLNESS:  The patient is a 30 year old black female with  past medical history significant for seizure disorder/Lennox-Gastaut  syndrome, mental retardation and cerebral palsy, who returns to the  emergency room today after multiple seizures and had another seizure while  IV was being started in the ER.  The seizures are described as tonic-clonic  and the one in the ER lasted approximately 30 seconds.  The patient was  discharged from the emergency room approximately 2 hours prior to this  presentation after being treated for seizures.  She had 3 mild seizures at  home and 2 seizures on the way to the emergency room.  The seizures are  described as tonic-clonic seizures and the one in the emergency room lasted  about 30 seconds.  In the ER, the patient was given Dilantin as well as  Ativan and neurology consulted.  She is admitted to the hospitalists'  service for further evaluation and management.  She was last discharged from  the hospital in March of 2005.   The patient is unable to give a history and so it is obtained from the chart  review and ER staff.   PAST MEDICAL HISTORY:  As stated above.   MEDICATIONS:  1. Depakene syrup 4 times per day.  2. Dilantin 100 mg b.i.d.  3. Primidone 2 times per day.  4. Zantac 75 mg/5 mL, 1 teaspoon q.12 h.   ALLERGIES:  No known drug allergies.   SOCIAL HISTORY:  No tobacco, no alcohol and no illicit drugs.   FAMILY HISTORY:  Noncontributory.   PHYSICAL EXAM:  GENERAL:  In general, the  patient is a young black female in  no acute distress.  She attempts to open her eyes and responds to sternal  rub.  VITAL SIGNS:  Her temperature is 99.6, her blood pressure 111/70, pulse of  94, respiratory rate 16 and her O2 saturation 100%.  HEENT:  Her pupils are equally round, sluggish; anicteric; atraumatic;  slightly dry mucous membranes.  NECK:  Supple, no adenopathy and no thyromegaly.  LUNGS:  Clear to auscultation bilaterally, no crackles or wheezes.  CARDIOVASCULAR:  Normal S1, S2; regular rate and rhythm; no S3 appreciated.  ABDOMEN:  Soft, bowel sounds present, nontender, nondistended.  There is a  PEG tube above the umbilical area, the site is clean and dry.  There is no  hepatomegaly and no masses palpable.  EXTREMITIES:  There is no cyanosis and there is no edema.  NEUROLOGIC:  On the neuro exam, she attempts to open her eyes in response to  sternal rub, otherwise, not responsive and does not follow commands.  __________ bilaterally.   LABORATORY DATA:  The white cell count is 11.2, the hemoglobin is 13.6,  hematocrit 40.6, the platelet count is 497,000, 81 neutrophils.  The  phenobarbital level is 46.3, Dilantin level 9.1, valproic acid level is 62.  The sodium is 138, potassium 3.8, chloride 106, the CO2 is 23, the BUN is 10  and the creatinine is 0.7.  Her glucose is 73.  UA:  The specific gravity is  1.031, pH of 7.0, negative for infection.   ASSESSMENT AND PLAN:  1. Recurrent seizures/Lennox-Gastaut syndrome -- neurology consulted in the     emergency room, agree with electroencephalogram and medications as per     neurology.  Dilantin increased to 125 mg b.i.d. and Mysoline 125 mg     b.i.d., valproic acid 250 mg p.o. q.4 h.  Follow and recheck levels in     a.m.  We will also recheck electrolytes in the a.m.  2. Mental retardation/cerebral palsy.  3. Deep venous thrombosis prophylaxis -- Lovenox subcutaneously q.12 h.                                                 Kela Millin, M.D.    ACV/MEDQ  D:  11/06/2003  T:  11/06/2003  Job:  [id number]

## 2010-09-12 NOTE — Discharge Summary (Signed)
NAME:  Megan [name], Megan [name]                            ACCOUNT NO.:  [id number]   MEDICAL RECORD NO.:  [id number]                   PATIENT TYPE:  INP   LOCATION:  3032                                 FACILITY:  [facility]   PHYSICIAN:  Sherin Quarry, MD                   DATE OF BIRTH:  05/03/1980   DATE OF ADMISSION:  11/05/2003  DATE OF DISCHARGE:  11/08/2003                                 DISCHARGE SUMMARY   HISTORY OF PRESENT ILLNESS:  Megan [name] is a 29 year old lady with a  chronic seizure disorder, mental retardation and cerebral palsy, who  initially presented to [facility] Emergency Room on November 06, 2003,  after having had repeated seizures.  Seizures were apparently generalized  and were described as lasting about 30 seconds.  At least 5 seizures were  observed.  In the emergency room, the patient was given 250 mg of IV  fosphenytoin as well as a IV Ativan for breakthrough seizures.   HOSPITAL COURSE:  The patient was followed during her hospitalization by Dr.  Janalyn Shy P. Sethi and by Dr. [name]. Hickling.  The patient's seizure  episodes seem to resolve and initially she was very lethargic but ultimately  became more alert.  She appeared to have returned to her baseline by November 08, 2003.  On November 08, 2003, the patient was discharged.   DISCHARGE MEDICATIONS:  1. Dilantin elixir per feeding tube, 125 mg b.i.d.  2. Depakene syrup 250 mg q.4 h. via PEG tube.  3. Mysoline 125 mg twice daily by a feeding tube.   DIET:  The patient will continue to receive tube feedings at home, as was  the case previously.   FOLLOWUP:  Followup will be with Dr. Sharene Skeans, et. al., in about 6 weeks.   CONDITION AT TIME OF DISCHARGE:  Condition at the time of discharge was  poor.   DISCHARGE DIAGNOSES:  1. Lennox-Gastaut syndrome.  2. Mental retardation.  3. Cerebral palsy.  4. Chronic seizure disorder.                                                Sherin Quarry, MD   SY/MEDQ   D:  11/08/2003  T:  11/08/2003  Job:  [id number]   cc:   [name]. Sharene Skeans, M.D.  [address]  [address]  [place]  [place] [address]  Fax: [phone]

## 2010-09-12 NOTE — Op Note (Signed)
[place]. [facility]  Patient:    Megan [name], Megan [name] Visit Number: [id number] MRN: [id number]          Service Type: DSU Location: [facility] [id number] Attending Physician:  Carlos Levering Dictated by:   Judie Petit. Leonia Corona, M.D. Proc. Date: 08/03/01 Admit Date:  08/03/2001                             Operative Report  INCOMPLETE  PREOPERATIVE DIAGNOSIS: 1. Fallen G-button. 2. Mental retardation with nutritional failure.  POSTOPERATIVE DIAGNOSES: 1. Fallen G-button. 2. Mental retardation with nutritional failure.  PROCEDURE:  Replacement of fallen G-button.  SURGEON:  Nelida Meuse, M.D.  ASSISTANT:  Nurse.  ANESTHESIA:  Topical EMLA cream anesthesia.  DESCRIPTION OF PROCEDURE:  The patient was placed supine in the endoscopy suite.  EMLA cream was already applied 45 minutes prior to the procedure.  The gastrostomy site was cleaned and draped.  The nonfunctioning Dictated by:   M. Leonia Corona, M.D. Attending Physician:  Carlos Levering DD:  08/03/01 TD:  08/03/01 Job: [id number]

## 2010-09-12 NOTE — Discharge Summary (Signed)
[place]  Patient:    Megan [name], Megan [name]                           MRN: [id number] Adm. Date:  [id number] Disc. Date: [id number] Attending:  Melodye Ped Dictator:   Pediatric Resident CC:         Dr. Jenne Campus, [facility]             [name]. Sharene Skeans, M.D.                           Discharge Summary  REASON FOR HOSPITALIZATION: 1. Increased seizure frequency. 2. Gastroenteritis.  HISTORY OF PRESENT ILLNESS AND HOSPITAL COURSE:  This is a 30 year old African-American female with Lennox-Gastaut encephalopathy who was admitted on Sep 08, 1999, with increased seizure frequency likely secondary to gastroenteritis which caused poor absorption of her antiepileptic medications. The patient was started on IV fluids while she had gastroenteritis.  Her anticonvulsants were changed to IV in order to control her seizures.  She was also diagnosed with an aspiration pneumonia, and was started on ceftriaxone. When her gastroenteritis improved, she was switched back to her regular antiepileptic medications.  Levels were checked on the day of discharge, and they showed a phenobarbital level of 25.8 (normal), valproic acid level of 50.7 (normal), and phenytoin of 16.8 (normal).  At the time of discharge her ammonia level was also elevated at 109.  At the time of discharge she had had one seizure lasting approximately five seconds over the last 48 hours preceding her discharge.  DISCHARGE MEDICATIONS: 1. Valproic acid 375 mg p.o. q.i.d. 2. Phenytoin 50 mg p.o. q.i.d. 3. Mysoline 250 mg p.o. t.i.d.  FOLLOW-UP:  The patient was scheduled for follow-up appointment with Dr. Jenne Campus on Sep 24, 1999, at 2:20 p.m.  At that time, she will need blood levels drawn for her antiepileptic medications.  She will also have an ammonia check at that time.  A follow-up appointment was also scheduled for Dr. Ellison Carwin on October 08, 1999, at 8:45  a.m.  DISCHARGE INSTRUCTIONS:  The patients family was instructed to call Dr. Sharene Skeans if Amil Amen developed increased sleepiness or decreased activity. They are further instructed to not give her afternoon doses of medications until blood levels were drawn to determine her anticonvulsant levels on the day of her follow-up appointment with Dr. Jenne Campus. DD:  09/16/99 TD:  09/19/99 Job: [id number]

## 2010-09-12 NOTE — Consult Note (Signed)
NAME:  CHOLE, DRIVER NO.:  [id number]   MEDICAL RECORD NO.:  [id number]          PATIENT TYPE:  EMS   LOCATION:  MAJO                         FACILITY:  [facility]   PHYSICIAN:  Bevelyn Buckles. Champey, M.D.DATE OF BIRTH:  1980/06/25   DATE OF CONSULTATION:  07/09/2005  DATE OF DISCHARGE:                                   CONSULTATION   REQUESTING PHYSICIAN:  Doug Sou, M.D.   REASON FOR CONSULTATION:  Seizure.   HISTORY OF PRESENT ILLNESS:  Ms. Spiewak is a 30 year old African-American  female, past medical history of seizures, Lennox-Gastaut syndrome, mental  retardation and cerebral palsy, who presents to the [facility] emergency  room with a grand mal seizure.  The patient was in school today and had  questionable recurrent seizures of jerking in her arms and eyes rolling.  Family questions whether these were seizures, that they were not typical  events.  The patient is currently back to baseline, which is also unusual as  after seizures she is tired, sleeping.  There has been no recent ill  contacts.  The patient did have a common cold last week.  The patient at  baseline is nonverbal and noncommunicative secondary to her MR and cerebral  palsy.  The patient is currently on Dilantin, Depakene and Mysoline.  The  patient has no other current problems as per family.  Last seizure was a few  months ago.   PAST MEDICAL HISTORY:  Positive for Lennox-Gastaut and seizures, also for  mental retardation and cerebral palsy.   CURRENT MEDICATIONS:  Depakote, Dilantin, Mysoline and Zantac.   ALLERGIES:  The patient has no known drug allergies.   FAMILY HISTORY:  Positive for diabetes and hypertension.   SOCIAL HISTORY:  The patient lives with grandmother.  Goes to __________.   REVIEW OF SYSTEMS:  Unable to obtain at this time secondary to baseline  mental status.   PHYSICAL EXAMINATION:  VITAL SIGNS:  Temperature is 98.1, blood pressure is  109/73, pulse is 87,  respirations 20.  GENERAL:  This is a 30 year old African-American female with mental  retardation and CP, who is at baseline nonverbal and noncommunicative.  HEENT:  Normocephalic, atraumatic.  The patient does have roving eye  movements noted.  The patient's face is symmetric.  She has gingival  hyperplasia noted.  CARDIAC:  Heart is regular.  LUNGS:  Clear.  ABDOMEN:  Soft, nontender.  EXTREMITIES:  Increased tone in all four extremities, good pulses.  NEUROLOGIC:  The patient is awake, nonverbal, not following commands.  Cranial nerves:  The patient has roving eye movements.  Pupils are equal and  reactive.  Face is symmetric.  Motor activity:  The patient has minimal  movement in all four extremities, has increased tone in all four  extremities.  Sensory examination is difficult to assess at this time.  Reflexes are brisk throughout.  Toes are neutral bilaterally.  Cerebellar  function and gait are unable to assess at this time.   LABORATORY DATA:  Dilantin level was 9.9.  Depakote level was 53.1.  Phenobarbital was 25.  IMPRESSION:  Ms. Carmicheal is a 30 year old African-American female with Lennox-  Gastaut and seizures, who had a breakthrough seizure.  I have discussed this  case with Dr. Sharene Skeans, her primary neurologist, and the patient is  currently back to baseline.  We will slightly increase her Depakene to 250  mg/5 mL at 6 mL q.4h.  We will keep her Dilantin and Mysoline at the current  dose.  A new prescription is given to the patient.  They are told to follow  up with Dr. Sharene Skeans in one two months.  They are told to call if they have  any questions or concerns.      Bevelyn Buckles. Nash Shearer, M.D.  Electronically Signed     DRC/MEDQ  D:  07/09/2005  T:  07/11/2005  Job:  [id number]

## 2010-09-12 NOTE — Op Note (Signed)
NAME:  PAXTON, KANAAN                  ACCOUNT NO.:  [id number]   MEDICAL RECORD NO.:  [id number]          PATIENT TYPE:  AMB   LOCATION:  ENDO                         FACILITY:  [facility]   PHYSICIAN:  Prabhakar D. Pendse, M.D.DATE OF BIRTH:  May 17, 1980   DATE OF PROCEDURE:  12/21/2005  DATE OF DISCHARGE:                                 OPERATIVE REPORT   PREOPERATIVE DIAGNOSES:  1. Nonfunctioning gastrostomy button.  2. Spastic quadriparesis with cerebral palsy.   POSTOPERATIVE DIAGNOSES:  1. Nonfunctioning gastrostomy button.  2. Spastic quadriparesis with cerebral palsy.   OPERATION PERFORMED:  1. Removal of nonfunctioning gastrostomy button, Mickey #18, 1.7 cm.  2. Placement of new #20 EndoVive gastrostomy replacement tube.   SURGEON:  Prabhakar D. Levie Heritage, M.D.   ASSISTANT:  Nurse.   ANESTHESIA:  None.   PROCEDURE:  Previously placed nonfunctioning gastrostomy button was removed  after deflating the balloon.  The area was cleansed.  A new #20 EndoVive  gastrostomy replacement tube was placed in and the balloon inflated with  about 8 mL to 9 mL of saline.  The disk was moved in order to fix the button  at the appropriate level.  The instructions were given to the mother  regarding the care of the tube.  Appropriate dressing applied and the  patient was discharged to be followed as an outpatient.           ______________________________  Hyman Bible. Levie Heritage, M.D.     PDP/MEDQ  D:  12/21/2005  T:  12/22/2005  Job:  [id number]

## 2010-09-12 NOTE — H&P (Signed)
[place]. [facility]  Patient:    Megan [name], Megan [name]                           MRN: [id number] Adm. Date:  [id number] Disc. Date: [id number] Attending:  Hilario Quarry CC:         Dr. Kalman Jewels of [facility]                         History and Physical  DATE OF BIRTH:  1980-05-04  HISTORY:  This is a 30 year old nonhanded African-American girl with Lennox-Gastaut encephalopathy, global severe developmental delay, intractable seizures both generalized and myoclonic, quadriparesis, and low vision.  Patient was admitted May 14-22, 2001 with increased frequency of seizures, vomiting, and congestion.  Pneumonia was suspected but not proven. Antiepileptic levels were low, leading to very frequent seizures which were worse at night.  DATE                May 16    May 17    May 18    May 19    May 20    May 22 SEIZURES PER DAY       7        50        51        28         2         1  VALPROIC ACID LEVEL             33.8      28.1      49.5      54.0      50.7 PHENYTOIN                        8.3      14.6      17.9      17.9      25.8                                         (free level                                            2.3) PHENOBARBITAL                             25.2       26.2      28.5      25.8 AMMONIA                         73        69/84      68        89       109  In baseline, the patient has 0-2 seizures per day.  She had brief myoclonic and rare generalized tonic-clonic seizures.  PAST MEDICAL HISTORY:   Birth history - The patient was born as a term infant without complications.  She had onset of seizures at three months of age.  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  She has had prior admissions recently - 1996 for pneumonia; August 1-5, 1999 and August 11-25, 1999 for fever, pneumonia, and frequent recurrent seizures.  Workup in 1999 - CT scan showed diffuse atrophy, increased size of  ventricles, sulci and subarachnoid spaces.  EEG, December 07, 1997 showed diffuse swelling, frontal diphasic synchronous sharp wave activity, no periodicity. No studies since 1999 as far as I know.  MEDICATIONS: 1. Depakote 125 mg sprinkles 3 p.o. q.i.d. 2. Dilantin 50 mg Infatabs 1 p.o. q.i.d. 3. Mysoline 250 mg tablets 1 p.o. t.i.d. (this has been only 125 t.i.d. on    admission, although the patient had only been on a total of 625 mg of    Depakote on admission).  ALLERGIES:  None.  PRESENT ILLNESS:  Patients seizures slowed with increasing doses of Depakote. She awakened and was near baseline at discharge.  Unfortunately, over the next few days she became a lot more sleepy, fed poorly, had difficulty taking in her medications as well as nutrition.  Yesterday, the patient had a low-grade temperature with frequent seizures noted (several per hour).  Some were a general tonic-clonic, others myoclonic in nature.  These were witnessed by the emergency room doctor who called me.  Decision was made to drop the Mysoline back to 125 t.i.d. and send the patient home.  At time of discharge, however, the temperature had risen to 104.2.  I was unaware of that.  Dilantin level 4.1, Depakote 56.5, no phenobarbital or ammonia levels were obtained.  Last night, patient had seizures "every few minutes."  This continued until this morning.  Patients last known seizure was before my initial phone call, around 11:30 a.m.  It is now 1:15 p.m.  Temperature max today was 100.4  This was reported to my partner as 104.  Mother called [facility] and was told to give Tylenol and call for increased fever or persistent seizures. My office was then called.  Patient was admitted for seizures, control fever, and evaluation of lethargy.  REVIEW OF SYSTEMS:  Positive for upper respiratory tract infection, difficulty swallowing, lethargy, and fever.  Negative for nausea, vomiting, diarrhea, shortness  of breath, productive cough, rash, or other constitutional dysfunction.  FAMILY HISTORY:  Negative for mental retardation, seizures, and blindness.  SOCIAL HISTORY:  The patient lives with grandparents and has done so since three months of age.  Mother has not been around to care for this child. Child attends [employer].  She has two cousins living in the home, ages 36 and 69, who are healthy.  There are smokers in the family.  PRIMARY CARE PHYSICIAN:  Dr. Kalman Jewels of [facility].  PHYSICAL EXAMINATION TODAY:  VITAL SIGNS:  Temperature 98.6, blood pressure 113/68, resting pulse 112, respirations 18.  HEENT:  Tympanic membranes are negative.  Pharynx I could not see.  She has foul breath.  NECK:  Supple, no bruits.  LUNGS:  Clear to auscultation, no dyspnea.  HEART:  No murmurs, pulses normal.  ABDOMEN:  Soft, bowel sounds normal, no hepatosplenomegaly.  EXTREMITIES:  Spastic and thin, hands are fisted.  No cyanosis or edema.  NEUROLOGIC:  Awake, poorly responsive.  Cranial nerves reveals pupils are sluggish, pale disks, sharp disk margins.  Roving eye movements.  No blink to bright light.  Eyes were disconjugate.  Bilateral exotropia.  Symmetric facial strength, some drooling.  Decreased gag and corneals.  Motor examination - Quadriparesis.  Tone greater on the  right than on the left.  Both hands fisted.  Little spontaneous movement.  Sensory examination - Withdrawal x 4.  Deep tendon reflexes normal in the upper extremities, brisk at the knees, sustained clonus at the ankles.  Toes did not move to plantar stimulation.  IMPRESSION: 1. Recurrent seizures, 345.11, 345.01, intractable.   Patient has decreased    levels of Dilantin, probably increased levels of barbiturate.  It is    hard to know what is decreasing her seizure threshold. 2. Fever of unknown source. 3. Lethargy with possibly increased phenobarbital and ammonia levels. 4. Static  encephalopathy.  SUGGEST:  Levels from phenobarbital, Mysoline, phenytoin, valproic acid,  comprehensive metabolic panel, CBC with diff, ammonia, and urinalysis. Patient will receive IV fluids for hydration and will be n.p.o. until she is awake and able to take nourishment and medications.  We will adjust her medications based on the patients levels.  We may need to add Carnitor to her medications if ammonia remains elevated.  I am not certain that this will change the situation.  Patient will be able to go home when she has decreased seizures, is afebrile, taking good oral medication and liquids, and less lethargy.                                    2.3 DD:  09/26/99 TD:  09/26/99 Job: [id number]

## 2010-09-12 NOTE — Op Note (Signed)
[place]  Patient:    Megan [name], Megan [name]                           MRN: [id number] Proc. Date: 10/06/99 Adm. Date:  [id number] Attending:  Gerrianne Scale CC:         [facility]                           Operative Report  PREOPERATIVE DIAGNOSIS: 1. Feeding difficulties and failure to thrive. 2. Verlee Monte encephalopathy and seizure disorder. 3. Multiple aspiration pneumonia episodes.  POSTOPERATIVE DIAGNOSIS: 1. Feeding difficulties and failure to thrive. 2. Verlee Monte encephalopathy and seizure disorder. 3. Multiple aspiration pneumonia episodes.  OPERATION PERFORMED:  Placement of percutaneous endoscopic gastrostomy tube, size 20 Ross, flex-flow model.  SURGEON:  Prabhakar D. Levie Heritage, M.D.  ASSISTANT:  Nurse.  ANESTHESIA:  Nurse.  DESCRIPTION OF PROCEDURE:  Under satisfactory general endotracheal anesthesia, with the patient in supine position, the abdomen was thoroughly prepped and draped in the usual manner.  The endoscope was gently passed through the oropharynx, advanced through the esophagus and under direct vision into the gastric cavity.  The stomach was distended with air and a site was selected in the epigastrium where pressure indentation with the finger was carried out to see the location corresponding to the anterior gastric wall.  A spot was selected for the placement of the tube.  With a #11 blade, a small incision was made in the epigastrium.  Now through this incision while with visual control through the endoscope, a stylette needle was passed into the stomach with ____________ technique.  The needle was visualized into the gastric cavity and the stomach was kept inflated.  Now through the needle, a guide wire was passed and through the scope, a snare was passed.  The guide wire was picked up by the snare and it was pulled out through the esophagus into the oropharynx and adequate length of the guide wire  was pulled out.  Now the #20 Ross flex-flow PEG tube assembly was threaded over the guide wire and with lubricant it was passed over the guide wire progressing through the entire esophagus, stomach and through the puncture wound of the anterior abdominal wall as well as the stomach.  After bringing out the PEG through the epigastric tiny puncture wound, it was pulled and gradually advanced by a side-to-side motion through the anterior abdominal wall until almost the entire length of the gastrostomy tube was pulled out and the bulb of the tube was lightly snug to the anterior gastric wall.  Care was taken not to pull the tubes so as to cause compression of that gastric wall between the bulb and the anterior abdominal wall.  The tube was rotated so one could see the rotation of the bulb from inside.  Several photographs were taken and now the tube was cut at appropriate length.  A stopper was passed over the tube and placed on the skin level so as to prevent dislodgement of the tube.  The triangular plastic base was sutured to the skin with two interrupted sutures. Appropriate dressings applied.  Throughout the procedure, the patients vital signs remained stable.  The patient withstood the procedure well and was transferred to the recovery room in satisfactory general condition. DD:  10/06/98 TD:  10/08/99 Job: [id number]

## 2010-09-12 NOTE — Discharge Summary (Signed)
. [facility]  Patient:    Megan [name], Megan [name]                           MRN: [id number] Adm. Date:  [id number] Disc. Date: [id number] Attending:  Gerrianne Scale Dictator:   Enzo Montgomery Irena Cords, M.D.                           Discharge Summary  ATTENDING PHYSICIANS:  Gerrianne Scale, M.D., from the department of pediatrics, and Asher Muir, M.D., form the department of pediatrics.  PRIMARY CARE Mabelle Mungin:  Dr. Jenne Campus from [facility].  OTHER PROVIDERS:  [name]. Sharene Skeans, M.D., and Hyman Bible. Pendse, M.D.  REASON FOR HOSPITALIZATION:  Seizures.  SECOND REASON FOR HOSPITALIZATION:  Need for PEG placement.  TREATMENT:  IV seizure medication, PEG placement.  DISCHARGE MEDICATIONS: 1. Phenytoin 150 mg via PEG tube q.12h. 2. Valproic acid 250 mg via PEG tube q.4h. 3. Primidone 250 mg via PEG tube q.12h.  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 medication intake.  The patient was initially controlled with IV seizure medications.  She was sent to the operating room on October 06, 1999, for placement of a PEG tube.  This was initially to facilitate enteral medications.  Following the procedure, the patient developed hypoxic respiratory failure and was intubated.  She was transferred to the medical intensive care unit where she remained intubated for approximately 36 hours during her intubation.  She was on IV antibiotics.  She was weaned quickly from the ventilator and quickly transferred back to the floor.  Upon transfer back to the floor, an oral feeding regimen was initiated and PEG feedings were also started.  Good seizure control was maintained on the floor.  DISCHARGE MEDICATIONS:  Listed above.  FOLLOW-UP:  The patient was to follow up with Dr. Sharene Skeans in August, 2001, with Dr. Levie Heritage in two to three weeks  following discharge, and with Dr. Jenne Campus approximately one month following discharge. DD:  10/14/99 TD:  10/15/99 Job: [id number]

## 2010-09-12 NOTE — Consult Note (Signed)
NAME:  Megan [name], Megan [name]                            ACCOUNT NO.:  [id number]   MEDICAL RECORD NO.:  [id number]                   PATIENT TYPE:  EMS   LOCATION:  MAJO                                 FACILITY:  [facility]   PHYSICIAN:  Pramod P. Pearlean Brownie, MD                 DATE OF BIRTH:  03/14/1981   DATE OF CONSULTATION:  DATE OF DISCHARGE:                                   CONSULTATION   REASON FOR CONSULTATION:  Status epilepticus.   HISTORY OF PRESENT ILLNESS:  Megan [name] is a 30 year old lady who was brought  in by her grandmother for recurrent seizures today.  The patient was  initially brought into the emergency room yesterday for having seizures in  the afternoon with mental status changes.  The patient was apparently having  recurrent back to back seizures lasting about 15 to 30 seconds, described as  generalized tonicoclonic activity with tongue biting and mouth frothing.  She was intermittently responsive between the seizures.  She was initially  seen in the emergency room yesterday and treated with Ativan, several doses.  She improved and she was discharged back home to her grandmother.   She, however, was brought back earlier this evening again with recurrent  seizures.  Her grandmother states she saw at least four or five seizures  occurring over a period of an hour.  Since arrival in the emergency room,  she has had at least 8 to 10 brief witnessed generalized tonicoclonic  seizures lasting 15 to 30 seconds.  She has been treated with several doses  of IV Ativan.   The patient has had similar recurrent episodes in the past and she gets them  every few months with breakthrough seizures which occur in clusters.  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  She is complete total care at home.  Lives  with family.  She has a feeding tube and  is incontinent.  She can barely  interact and cannot speak.  She has been on several anticonvulsants in the  past including Dilantin, Phenobarbital, Depakote.  She had recurrent  admissions in the past for seizures which have quite often been precipitated  by infections and upper respiratory tract infections.  The grandmother does  state that two weeks ago she did suffer from a cold.  She is presently  taking the following medications.   MEDICATIONS:  1. Mysoline 250 mg b.i.d. via PEG.  2. Dilantin 100 mg b.i.d.  3. Valproic acid 250 mg q.4h. via PEG.   PAST MEDICAL HISTORY:  1. Lanneox-Gastaut syndrome.  2. Mental retardation.  3. Cerebral palsy.  4. Seizure disorder.   PAST SURGICAL HISTORY:  1. PEG tube May 2001.  2. Multiple surgeries for recorrection of the PEG  tube.   ALLERGIES:  None.   SOCIAL HISTORY:  The patient lives with her grandmother.  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  She is totally for care.   REVIEW OF SYMPTOMS:  Positive for cold two weeks ago.  She is unable to  swallow.  Has lethargy.  Unable to communicate at baseline.  No recent  urinary tract infection, nausea, vomiting, diarrhea, cough, or shortness of  breath.   FAMILY HISTORY:  Not significant for any seizures or mental retardation.   PRIMARY CARE PHYSICIAN:  Kalman Jewels, M.D. at [facility].   PHYSICAL EXAMINATION:  GENERAL:  A young African American lady who is lying  comfortably in bed.  She is sedated with several doses of Ativan.  VITAL SIGNS:  She is afebrile.  She presents with pulse rate of 114 and  regular, blood pressure 120/70, respiratory rate 18.  Permanent saturations  95% on two liters oxygen.  CARDIOVASCULAR:  No murmur or gallop.  LUNGS:  Clear to auscultation.  ABDOMEN:  Soft and nontender.  NEUROLOGICAL:  The patient is postictal and sedated with Ativan.  She is not  following any commands and is unresponsive.  Pupils are 5 mm and reactive.   Dolls eye movements are sluggish.  She withdrawals minimally to noxious  stimuli in all four extremities.  She has bilateral foot drop with  dorsiflexion and inversion of both toes.  Reflexes are all brisk.  Tone is  slightly decreased in upper extremities.  Plantar pressure leads to mild  withdrawal response bilaterally.  Sensation and coordination cannot be  reliably tested.   LABORATORY DATA:  Dilantin level is 9.8.  Valproic acid level is 62.  Phenobarbital level is pending.  WBC count is unremarkable.  Blood  chemistries are normal.   IMPRESSION:  A 30 year old lady with Lanneox-Gastaut syndrome with life-long  history of seizures.  She presents with cluster of seizures and with low  normal levels of Dilantin and Valproic acid.  Apart from recent urinary  tract infections, no obvious precipitant for seizures.   PLAN:  The patient is being admitted to [facility].  I will  recommend IV fosphenytoin at 250 mg x 1 now.  Check ammonia level.  Continue  IV Ativan p.r.n. for breakthrough seizures.  Consider increasing her  fosphenytoin.  I decide to increase her Dilantin 125 mg twice a day.  Check  level and aim for Dilantin level 15 to 25 mg%.   I had a long discussion with the patient's grandmother in regard to her  symptoms.  We will plan for evaluation, treatment, and onset questions.  An  EEG tomorrow to rule out subtle status epilepticus.  I look forward to  seeing her in follow-up and subsequently call for questions.                                               Pramod P. Pearlean Brownie, MD    PPS/MEDQ  D:  11/05/2003  T:  11/05/2003  Job:  [id number]

## 2010-09-12 NOTE — Op Note (Signed)
. [facility]  Patient:    Megan [name], Megan [name]                           MRN: [id number] Proc. Date: 12/26/99 Adm. Date:  [id number] Attending:  Leonia Corona                           Operative Report  DATE OF BIRTH:  1980-07-10.  PREOPERATIVE DIAGNOSIS:  Cerebral palsy with severe epilepsy with nutritional failure.  POSTOPERATIVE DIAGNOSIS:  Cerebral palsy with severe epilepsy with nutritional failure.  OPERATION:  Change of G-tube to G-button.  SURGEON:  Evalee Mutton. Leeanne Mannan, M.D.  ANESTHESIA:  Local.  DESCRIPTION OF PROCEDURE:  Procedure performed in the endoscopy unit of [facility] on December 26, 1999.  The patient is brought in to the endoscopy unit and placed supine on the bed. Surrounding area of the G-button was already smeared with ______ cream about 45 minutes prior to the procedure.  The pre-existing Billroth gastrostomy tube #20 [place] was pulled out by a steady constant traction and without any difficulty.  The thickness of the abdominal wall for the new gastrostomy tube was measured by inserting a Foley balloon catheter and measuring the thickness of the wall which appeared to be 2.4 cm.  We chose a 2.5 cm G-button 20 [place] for the replacement.  A 20 French 2.5 cm well-lubricated mig button was introduced into the gastrostomy opening without any difficulty and the balloon was inflated to 4 cc capacity. The tube went in smoothly and fit snugly without causing any pressure or leak around the tube.  The gastrostomy correct placement was checked by aspiration of gastric content and free flushing of the tube.  The necessity instruction and training was given to the mother to take care of the G-button by dinner prior to discharge.  The patient tolerated the procedure very well which went smooth and uneventful.  The patient was later discharged to home with instructions to follow up in three months. DD:   12/26/99 TD:  12/26/99 Job: [id number]

## 2010-09-12 NOTE — Op Note (Signed)
   NAME:  Megan [name], Megan [name]                            ACCOUNT NO.:  [id number]   MEDICAL RECORD NO.:  [id number]                   PATIENT TYPE:  AMB   LOCATION:  ENDO                                 FACILITY:  [facility]   PHYSICIAN:  Leonia Corona, M.D.               DATE OF BIRTH:  25-Oct-1980   DATE OF PROCEDURE:  02/20/2003  DATE OF DISCHARGE:                                 OPERATIVE REPORT   PREOPERATIVE DIAGNOSES:  1. Malfunctioning gastric button.  2. Cerebral palsy.  3. Mental retardation.  4. Nutritional failure.   POSTOPERATIVE DIAGNOSES:  1. Malfunctioning G-button.  2. Cerebral palsy.  3. Mental retardation.  4. Nutritional failure.   PROCEDURE:  Change of gastric button.   ANESTHESIA:  Topical anesthesia.   SURGEON:  Leonia Corona, M.D.   PROCEDURE:  The procedure was performed in the endoscopy suite.  The  malfunctioning MIC button which was still in place, held with tape, was  removed by deflating the balloon.  EMLA cream was already applied 30 minutes  ago.  A well-lubricated fresh MIC button 18-French 2.5 cm was inserted  without any difficulty.  The balloon was inflated to 5 mL of water.  The  button was well-fitting and was tested with an extension connected to the  button, and flushed easily with saline and the returned gastric contents  confirming the correct placement of the G-button.  The procedure was smooth  and without any complications.  Later on, the patient will be allowed to go home with instructions for  routine care of the MIC button.                                                Leonia Corona, M.D.    SF/MEDQ  D:  02/20/2003  T:  02/20/2003  Job:  [id number]

## 2010-09-12 NOTE — Discharge Summary (Signed)
Megan [name], BOEREMA                  ACCOUNT NO.:  [id number]   MEDICAL RECORD NO.:  [id number]          PATIENT TYPE:  INP   LOCATION:  3010                         FACILITY:  [facility]   PHYSICIAN:  Fransisco Hertz, M.D.  DATE OF BIRTH:  [birth year]   DATE OF ADMISSION:  01/17/2008  DATE OF DISCHARGE:  01/21/2008                               DISCHARGE SUMMARY   DISCHARGE DIAGNOSES:  1. Pneumonia.  2. Digoxin toxicity.  3. Generalized tonic-clonic seizures.  4. Spastic quadriparesis.  5. Cortical blindness.  6. Severe mental retardation.  7. Cerebral palsy.  8. Gastroparesis.  9. History of aspiration pneumonia in May 2009.  10.History of respiratory failure with intubation in May 2009.   DISCHARGE MEDICATIONS:  1. Mysoline 250 mg half of 1 tablet p.o. b.i.d.  2. Protonix 2 mg/mL give 20 mL through tube daily.  3. Depakene 250 mg/5 mL give 30 mL through tube 4 times a day.  4. Lopressor 25 mg/10 mL give 10 mL through tube twice a day.  5. Jevity 300 mL give through tube every 6 hours.  6. Zithromax 200 mg/5 mL give 500 mg daily for 7 days.  7. Tamiflu 75 mg/1 mL give last dose tonight at 10 p.m. through tube.   CONDITION ON DISCHARGE:  The patient has responded well to treatment.  The patient has not had any elevation in white blood cells for more than  24 hours.  She is clear to auscultation.  We will discharge the patient  on a 7-day course of Zithromax.  Home Health has been arranged to check  Dilantin levels on Monday.  If Dilantin levels are less than 20, we will  start Dilantin again.  Results of Dilantin levels on Monday should be  forwarded to Dr. Cena Benton at [facility]  Ms. Maple Hudson, the  patient's caregiver, verbalized understanding of discharge instructions.  The patient will also be scheduled an appointment with Dr. Sharene Skeans for  further management.   CONSULTATIONS:  Michael L. Thad Ranger, MD, Neurology.   HISTORY OF PRESENT ILLNESS:  A 30 year old woman with  past medical  history of cerebral palsy, seizure disorder, severe mental retardation  presents with fever and cough.  The patient had had a cough for 2  months.  She has been on Zithromax, Levaquin, and Keflex.  The patient  has not had increased white blood cell elevation until today's  admission.  She had not had any fevers at any other presentations until  today's admission.  Previous chest x-rays were negative for infiltrates  until today's admission.  The patient presents with fever of 103.6.   PHYSICAL EXAMINATION:  VITAL SIGNS:  Temperature 103.6, blood pressure  121/88, pulse 107, respiratory rate 28, oxygen saturation 100 on room  air.  GENERAL:  The patient lying in bed in contracted position in mild  distress.  HEENT:  Eyes, the patient is blind.  ENT, would not open out on command,  poor dentition, moist tongue and gums.  NECK:  No JVD.  RESPIRATORY:  Diffuse upper airway, gurgling.  Positive rhonchi on right  lung.  Good air movement.  Left lung clear to auscultation.  CARDIOVASCULAR:  Tachy.  No murmurs, rubs, or gallops.  GASTROINTESTINAL:  Bowel sounds positive.  Soft and depressible,  nontender, and nondistended.  EXTREMITIES:  Left medial malleolus ulcer, 1-inch diameter.  SKIN:  No erythema.  No rash.  No bruising.  NEUROLOGIC:  Nonresponsive.   ADMISSION LABORATORY DATA:  White blood cells 14.0, hemoglobin 13.8,  hematocrit 41.4, platelets 305.  ABN 11.6, sodium 174, potassium 3.8,  chloride 97, bicarbonate 24, glucose 219, BUN 6, creatinine 0.71, total  bilirubin 0.6. Alkaline phosphatase 88, AST 63, ALT 26.  Total protein  8.4, albumin 3.3, calcium 8.9.  Phenobarbital level 31.7, phenytoin  41.7.  Primidone 4, valproic acid 72.4.  Legionella antigen negative.   HOSPITAL COURSE:  1. Pneumonia, most likely due to aspiration.  Chest x-ray showed a      right lower lobe air space disease consistent with pneumonia.  The      patient was immediately started with  vancomycin, Zosyn, and      azithromycin to cover a broad array of organisms that may be      causing the patient's pneumonia due to immunocompromised state.      Blood cultures were done and showed no growth.  Legionella antigen      was negative.  Sputum cultures showed nonpathogenic oropharyngeal-      type flora isolated.  Tamiflu was also added to medical regimen.      The patient responded well to treatment and was eventually      medically stable to discharge home.  Upon discharge, the patient      was given a course of 7 days of Zithromax.  The patient's caregiver      was instructed on appropriate dosage of medication that was to be      given.  The patient's caregiver verbally expressed understanding of      medications on discharge.  2. Seizures.  The patient's caregiver reported the patient to be      having 2-4 small seizures per day.  The patient's blood levels of      pentobarbital, phenytoin, primidone, and valproic acid were      measured.  It was found that she had supratherapeutic phenytoin      levels and thus Dilantin was held.  Consulted Neurology for further      recommendations on management.  The patient did not have any      episodes of seizure while in the hospital.  The patient's phenytoin      levels day before discharge were 32.6.  Phenytoin levels were still      supratherapeutic, so phenytoin was continued to be held.  Home      Health arrangements were done before discharge to have appropriate      levels of Dilantin and measured once the patient was discharged.      Results of blood tests were to be communicated to [facility].  Upon reviewing of results, it was to be decided whether      Dilantin would be started again.  The patient will continue, upon      discharge, with further antiseizure medication.  We will have the      patient follow up with Dr. Sharene Skeans for further management.  3. Nutrition.  Consult was made for appropriate  nutrition      supplementation while the patient was  in the hospital.      Electrolytes were stabilized and replenished as necessary.  TF      regimen met 100% of estimated the patient's nutritional needs.   DISCHARGE VITALS:  Temperature 97.6, blood pressure 120/78, pulse 95,  respiratory rate 18, oxygen saturation 100 on 2 L.   DISCHARGE LABORATORY DATA:  White blood cells 5.8, hemoglobin 9.5,  hematocrit 28.7, platelets 210.  Sodium 138, potassium 4.2, chloride  105, bicarbonate 29, BUN 1, creatinine less than 0.3, glucose 88.      Danne Harbor, MD  Electronically Signed      Fransisco Hertz, M.D.  Electronically Signed    RV/MEDQ  D:  02/28/2008  T:  02/29/2008  Job:  [id number]

## 2010-09-12 NOTE — Procedures (Signed)
AGE:  30   CONSULTING PHYSICIAN:  Delia Heady, M.D. on the stroke team.   This is a routine EEG recording in awake and asleep stages for this right-  handed African-American female who has a history of seizure disorder, mental  retardation, and cerebral palsy.  The patient is nonverbal and is described  as very lethargic.   CURRENT MEDICATIONS:  Phenergan, Lovenox, Zantac, Depakene, Dilantin,  primidone.   This is a motion-artifact-marred EEG which shows frequent epileptiform  discharges originating from both temporals as well as centrally at  frequencies of 1-2 Hz and occurring in intervals between 1 and 5 seconds.  There is no organized background seen.  No posterior dominant background can  therefore be described and there is no central dominant rhythm seen either.  Beta fast artifact is seen throughout the recording and is probably  medication induced.   CONCLUSION:  This is an abnormal EEG with no defined posterior background  rhythm or organization, showing frequent ictal discharges over the left and  right temporal region which seem to appear primary generalized and appear  frequently but in no specific rhythm or periodicity.  Clinical correlation  is recommended.    Melvyn Novas, M.D.   ZO:[id number]  D:  11/07/2003 08:31:34  T:  11/07/2003 09:49:00  Job #:  [id number]
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
## 2010-09-12 NOTE — Op Note (Signed)
. [facility]  Patient:    Megan [name], Megan [name] Visit Number: [id number] MRN: [id number]          Service Type: DSU Location: [facility] 2854 [id number] Attending Physician:  Carlos Levering Dictated by:   Judie Petit. Leonia Corona, M.D. Proc. Date: 08/03/01 Admit Date:  08/03/2001                             Operative Report  CONTINUATION:  PREOPERATIVE DIAGNOSIS: 1. Nonfunctioning G-button. 2. Mental retardation with nutritional failure.  POSTOPERATIVE DIAGNOSIS: 1. Nonfunctioning G-button. 2. Mental retardation with nutritional failure.  OPERATION PERFORMED:  Replacement of G-button.  SURGEON:  Nelida Meuse, M.D.  ASSISTANT:  Nurse.  ANESTHESIA:  Topical EMLA cream.  DESCRIPTION OF PROCEDURE:  The patient was brought to the endo suite, placed supine on the bed.  The G-button site had already been smeared with EMLA cream 45 minutes prior to the procedure.  The G-button which was nonfunctioning, not flushing and was not able to ____________ balloon was pulled out without much difficulty with maximal possible deflation of the balloon.  The 2.7 cm 20 [place] Mickey button was well lubricated and placed through the gastrostomy without any difficulty.  The balloon was inflated to 5 cc of saline.  The correct placement was confirmed by flushing with saline and aspirating the gastric content.  Antibiotic cream was applied in the periphery of the G-button.  The patient tolerated the procedure well which was smooth and uneventful.  The patient was later sent home with instruction of routine G-button care. Dictated by:   Judie Petit. Leonia Corona, M.D. Attending Physician:  Carlos Levering DD:  08/03/01 TD:  08/03/01 Job: [id number]

## 2010-09-12 NOTE — Consult Note (Signed)
[place]. [facility]  Patient:    Megan [name], Megan [name]                           MRN: [id number] Proc. Date: 09/12/99 Adm. Date:  [id number] Disc. Date: [id number] Attending:  Cathren Laine Dictator:   [id number] CC:         Kirby Funk, M.D., M.P.H.                          Consultation Report  DATE OF BIRTH: 08-14-1980  CHIEF COMPLAINT: Status epilepticus.  HISTORY OF PRESENT ILLNESS: Megan [name] is well known to me for years.  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  This hospitalization began with frequent seizures in the setting of gastroenteritis in the setting of a febrile viral illness.  Drug levels were and have been subtherapeutic for valproic acid.  Dilantin level has varied from therapeutic to subtherapeutic.  Seizures have intermittently clustered for hours, particularly in the early morning hours of May 17 and Sep 12, 1999.  Between 0400 and 1000 hours yesterday, Sep 11, 1999 mysteriously they slowed until last evening.  There was a brief cluster between 2215 and 005 hours.  The patient then commenced to have seizures at 0430 and has continued to have seizures every few minutes to the present (745).  They continue as this is being dictated.  The patient has been treated with phenobarbital, Ativan, Dilantin, and Depakote without cessation.  Events through the night have become somewhat less prolonged, dropping from about 45 seconds to about 15 seconds.  The patient has less choking and less need for suctioning.  Little change in pulse oximetry or vital signs changes.  Levels drawn stat this morning show a valproic acid level of 28.3 mcg/ml, phenobarbital level 25.2 mcg/dl, phenytoin 13.2 mcg/ml, albumin 2.7, ammonia 73 mcmol/l.  CURRENT MEDICATIONS:  1. Depacon 250 mg in the morning and 125 mg at mid day,  250 mg at night IV.  2. Dilantin, loaded with 200 mg yesterday, routine dose is 100 mg in the     morning and 200 mg at night.  Currently being given as phenytoin.  3. Phenobarbital, loaded at 80 mg yesterday, now 40 mg b.i.d.  4. Ativan 0.5 mg IV this morning.  The patient has had previous worries with seizures like this, most notably on two separate occasions in August 1999.  The latter of the two led to prolonged hospitalization with very similar situation of subtherapeutic levels and runs of re-seizures that were accompanied by failure to return to baseline in between with definition of status epilepticus.  She has run high ammonia levels before with use of IV Depacon because valproic acid is able to efficiently de-couple the incorporation of ammonia into urea.  PHYSICAL EXAMINATION:  VITAL SIGNS: Temperature 99.1 degrees axillary, blood pressure 105/36 resting, pulse 96, respirations 22.  HEENT: No signs of infection.  LUNGS: Clear.  HEART: No murmurs.  Pulses normal.  ABDOMEN: Soft, bowel sounds normal.  EXTREMITIES: No edema or cyanosis.  NEUROLOGIC: The patient is either postictal or ictal.  She is having frequent tonic seizures with clonic activity of the upper extremities, some head movements, copious secretions.  Pupils reactive 4-3.  Benign fundi.  Pale discs.  Full Dolls eyes.  Symmetric face.  Weak  gag.  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  Increased tone.  Hands are fisted.  Knees are in semi-flexed position.  She has diffuse contractures.  Sensory examination shows withdrawal x 4, legs greater than arms.  Deep tendon reflexes were normal without significant clonus.  Toes are bilaterally extensor.  IMPRESSION: Status epilepticus, 345.3, without compromise of vital signs.  2. Static encephalopathy, Lennox-Gastaut type.  3. Subtherapeutic drug levels.  4. Hyperammonemia secondary to intravenous Depacon.  5.  Hypoalbuminemia.  PLAN:  1. Will place a Panda tube so that we can give valproic acid orally.  This     may help [place] with the problem with increasing ammonias.  It will     definitely slow the absorption of Depakote but I think that it may     overall lead to more stable levels over time.  Will give Depakene 250 mg     per 5 cc (250 mg) q.3h now, at 11 a.m. and 2 p.m.  I would check drug     level for valproic acid at 1 p.m. or just before the 2 p.m. dose so that     we can see if we are having any impact upon the drug level.  If the level     if less than 50 mcg/ml I would continue to give Depakene 250 mg q.3h for     three more doses, checking another level just before the sixth dose, which     would be about midnight or 1 a.m. If the level if greater than 50 at     1 p.m. then I would change the patients dosage to 250 mg per NG q.6h.     I would increase Dilantin to 100 mg per NG q.6h.  This will bypass the     need for use of IV phenytoin.  I would continue phenobarbital at the     current dose and switch this to NG as well.  I would avoid the use of     IV Ativan unless vital signs or airway are being compromised by the     seizures.  This would ultimately lead to decreased pulse oximetry.  2. If we add Ativan to phenobarbital and Depakene we will likely sedate the     patient to the point where she needs intubation, which I would like to     avoid.  3. I would check levels of Depakote at 1 p.m. and at midnight and again in     the morning, Sep 13, 1999.  Dilantin level in the morning, Sep 13, 1999.     Dilantin may drop as we go to oral but for that reason I have increased     the overall dose.  Phenobarbital in the morning, Sep 13, 1999.  4. Ammonia levels at 1 p.m. and at might and also in the morning, Sep 13, 1999.  5. Please call me today with the 1 p.m. results and call me if needed at     [phone].  After hours call the physician on-call, Dr. Lesia Sago     tonight and  Dr. Avie Echevaria during the weekend [phone]).  This has     been discussed with the residents and I will attempt to be of assistance      to them while I am around. DD:  09/12/99 TD:  09/12/99 Job: [id number]

## 2010-09-12 NOTE — Discharge Summary (Signed)
NAME:  Megan [name], Megan [name]                            ACCOUNT NO.:  [id number]   MEDICAL RECORD NO.:  [id number]                   PATIENT TYPE:  INP   LOCATION:  3015                                 FACILITY:  [facility]   PHYSICIAN:  Megan [name], M.D.               DATE OF BIRTH:  Apr 07, 1981   DATE OF ADMISSION:  07/18/2003  DATE OF DISCHARGE:  07/20/2003                                 DISCHARGE SUMMARY   DISCHARGE DIAGNOSES:  1. Seizure disorder.  2. Mental retardation.  3. Cerebral palsy.   DISCHARGE MEDICATIONS:  1. Zantac 1 teaspoon q.12 h. by PEG tube.  This is a 75 mg per 5 mL     suspension.  2. Mysoline 250 mg one b.i.d. by PEG.  3. Dilantin 50 mg two tablets b.i.d. by PEG.  4. Valproic acid 250 mg per 5 mL solution one teaspoon q.4 h. by PEG.   DISPOSITION:  Discharged to home.   FOLLOW UP:  With Dr. Ellison Carwin on Thursday the 31st of March at 10  a.m.   CONSULTATIONS:  Included Dr. Ellison Carwin, neurology.   PROCEDURES PERFORMED:  Included chest x-rays the initial showing partial  collapse of the right lower lobe of her lung, subsequent showing re-  inflation of the right lower lobe of the lung.   ADMISSION HISTORY AND PHYSICAL:  This is a 30 year old female with a history  significant for cerebral palsy, mental retardation, and seizure disorder  presented to the emergency room after having multiple seizures at her  school.  She was brought to the emergency department by her grandmother and  was actively seizing.  She was given a total of 4 mg of Ativan and her  seizures broke.  She was also loaded with Dilantin.   PAST MEDICAL HISTORY:  As noted above.   SURGICAL HISTORY:  She has had a feeding tube placed in June of 2001.   MEDICATIONS:  The same as discharge.   SOCIAL HISTORY:  She is single, lives with her grandmother and attends [employer].  No alcohol.  No tobacco.   PHYSICAL EXAMINATION:  VITAL SIGNS:  Heart rate 82, temp 98.6,  blood  pressure 91/57, respirations 88.  Saturating 100% on 5 liter mask.  GENERAL:  She arouses when her limbs are moved.  HEENT:  Pupils sluggishly reactive.  OP without erythema or exudate.  NECK:  Is without lymphadenopathy or thyromegaly.  LUNGS:  Coarse breath sounds are noted bilaterally.  CARDIOVASCULAR:  Regular rate and rhythm.  No murmurs, rubs, gallops.  2+  distal pulses.  ABDOMEN:  Soft, nontender, nondistended with normoactive bowel sounds.  EXTREMITIES:  She moves all four extremities.  SKIN:  There are no rashes or lesions noted.  MUSCULOSKELETAL:  She had contractures left greater than right.  NEUROLOGICAL:  Brisk DTRs.   LABORATORY DATA:  Dilantin level is 4, valproate 63.  HOSPITAL COURSE:  SEIZURE DISORDER:  The patient's seizures broke and she  was without seizure during her hospitalization after her treatment in the  emergency department.  After her load of Dilantin and Ativan, she returned  to baseline.  Shortly after her Dilantin load, she was seen by Dr. Sharene Skeans  who did not desire to make any changes to her medications at this time.  The  patient was discharged in improved condition with the previously noted  follow up.      Megan Beath, MD                     Megan [name], M.D.    JT/MEDQ  D:  07/20/2003  T:  07/21/2003  Job:  [id number]   cc:   [name]. Sharene Skeans, M.D.  [address]  [address]  [place]  [place] [address]  Fax: [phone]

## 2010-09-12 NOTE — Op Note (Signed)
[place]. [facility]  Patient:    Megan [name], Megan [name]                           MRN: [id number] Proc. Date: 11/26/00 Adm. Date:  [id number] Attending:  [place] Pho Damodar                           Operative Report  PREOPERATIVE DIAGNOSIS:  Nonfunctioning gastrostomy button.  POSTOPERATIVE DIAGNOSIS:  Nonfunctioning gastrostomy button.  PROCEDURES: 1. Removal of old nonfunctioning gastrostomy button. 2. Placement of new MIC #20, 2.5 cm button.  SURGEON:  Prabhakar D. Levie Heritage, M.D.  ASSISTANT:  Nurse.  ANESTHESIA:  Nurse.  DESCRIPTION OF PROCEDURE:  The gastrostomy site was cleaned.  The previously-placed gastrostomy button was removed with some difficulty.  I was unable to deflate the balloon by _____.  The button was partially extracted, held with a hemostat, and the stem was cut gradually to drain the fluid from the balloon.  After satisfactory decompression of the balloon, button was removed, area was cleansed.  New MIC #20, 2.5 cm button was placed in without difficulty.  The button was irrigated with 50 cc of saline.  Instructions were given to the grandmother regarding the care of the button.  The patient was discharged to be followed as an outpatient. DD:  11/26/00 TD:  11/27/00 Job: [id number]

## 2010-09-18 ENCOUNTER — Encounter: Payer: Self-pay | Admitting: Family Medicine
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
## 2010-10-04 ENCOUNTER — Emergency Department (HOSPITAL_COMMUNITY): Payer: Medicaid Other

## 2010-10-04 ENCOUNTER — Inpatient Hospital Stay (HOSPITAL_COMMUNITY)
Admission: EM | Admit: 2010-10-04 | Discharge: 2010-10-06 | DRG: 178 | Disposition: A | Discharge status: Home / Self Care | Payer: Medicaid Other | Attending: Family Medicine | Admitting: Family Medicine

## 2010-10-04 DIAGNOSIS — E039 Hypothyroidism, unspecified: Secondary | ICD-10-CM | POA: Diagnosis present

## 2010-10-04 DIAGNOSIS — G808 Other cerebral palsy: Secondary | ICD-10-CM | POA: Diagnosis present

## 2010-10-04 DIAGNOSIS — H548 Legal blindness, as defined in USA: Secondary | ICD-10-CM

## 2010-10-04 DIAGNOSIS — J690 Pneumonitis due to inhalation of food and vomit: Principal | ICD-10-CM | POA: Diagnosis present

## 2010-10-04 DIAGNOSIS — Z932 Ileostomy status: Secondary | ICD-10-CM

## 2010-10-04 DIAGNOSIS — F72 Severe intellectual disabilities: Secondary | ICD-10-CM | POA: Diagnosis present

## 2010-10-04 DIAGNOSIS — F79 Unspecified intellectual disabilities: Secondary | ICD-10-CM

## 2010-10-04 DIAGNOSIS — I10 Essential (primary) hypertension: Secondary | ICD-10-CM | POA: Diagnosis present

## 2010-10-04 DIAGNOSIS — G40909 Epilepsy, unspecified, not intractable, without status epilepticus: Secondary | ICD-10-CM | POA: Diagnosis present

## 2010-10-04 DIAGNOSIS — H543 Unqualified visual loss, both eyes: Secondary | ICD-10-CM | POA: Diagnosis present

## 2010-10-04 LAB — CBC
MCH: 32.2 pg (ref 26.0–34.0)
Platelets: 291 10*3/uL (ref 150–400)
RBC: 3.51 MIL/uL — ABNORMAL LOW (ref 3.87–5.11)
RDW: 16.3 % — ABNORMAL HIGH (ref 11.5–15.5)
WBC: 18.9 10*3/uL — ABNORMAL HIGH (ref 4.0–10.5)

## 2010-10-05 ENCOUNTER — Encounter: Payer: Self-pay | Admitting: Family Medicine

## 2010-10-05 LAB — POCT I-STAT 3, ART BLOOD GAS (G3+)
TCO2: 37 mmol/L (ref 0–100)
pCO2 arterial: 55.6 mmHg — ABNORMAL HIGH (ref 35.0–45.0)
pH, Arterial: 7.41 — ABNORMAL HIGH (ref 7.350–7.400)

## 2010-10-05 LAB — BASIC METABOLIC PANEL
CO2: 35 mEq/L — ABNORMAL HIGH (ref 19–32)
Chloride: 98 mEq/L (ref 96–112)
Potassium: 3.9 mEq/L (ref 3.5–5.1)
Sodium: 137 mEq/L (ref 135–145)

## 2010-10-05 LAB — POCT I-STAT, CHEM 8
BUN: 11 mg/dL (ref 6–23)
HCT: 38 % (ref 36.0–46.0)
Sodium: 135 mEq/L (ref 135–145)
TCO2: 31 mmol/L (ref 0–100)

## 2010-10-05 LAB — CBC
MCV: 96.9 fL (ref 78.0–100.0)
Platelets: 266 10*3/uL (ref 150–400)
RBC: 3.56 MIL/uL — ABNORMAL LOW (ref 3.87–5.11)
RDW: 16.6 % — ABNORMAL HIGH (ref 11.5–15.5)
WBC: 16.2 10*3/uL — ABNORMAL HIGH (ref 4.0–10.5)

## 2010-10-05 NOTE — Progress Notes (Signed)
Family Medicine Teaching [facility] Admission History and Physical  Patient name: Megan [name] Medical record number: [id number] Date of birth: 07/24/1980 Age: 30 y.o. Gender: female  Primary Care Provider: Demetria Pore, MD  Chief Complaint: cough History of Present Illness: Megan [name] is a 30 y.o. year old female presenting with cough productive of white sputum x 3 weeks.  She developed a fever today of 100.2 so the family brought her in for evaluation.  Pt is severally mentally retarded so is unable to answer questions.  Family was not in the room when I examined the patient.  Patient Active Problem List  Diagnoses  . CEREBRAL PALSY  . BLINDNESS, BILATERAL  . Seizure  . Aspiration of stomach contents  . Hypertension  . Hypothyroidism   Past Medical History: Past Medical History  Diagnosis Date  . Cerebral palsy, quadriplegic   . Cortical blindness   . Seizures   . Mental retardation   . Cerebral palsy   . Hypertension 06/23/2010    Past Surgical History: Past Surgical History  Procedure Date  . Peg tube placement     Social History: History   Social History  . Marital Status: Single    Spouse Name: N/A    Number of Children: N/A  . Years of Education: N/A   Social History Main Topics  . Smoking status: Never Smoker   . Smokeless tobacco: Never Used  . Alcohol Use: No  . Drug Use: No  . Sexually Active: No   Other Topics Concern  . Not on file   Social History Narrative  . No narrative on file    Family History: Family History  Problem Relation Age of Onset  . Diabetes Mother   . Diabetes Maternal Grandmother     Allergies: Allergies  Allergen Reactions  . Doxycycline     REACTION: Blisters / swelling    Current Outpatient Prescriptions  Medication Sig Dispense Refill  . levothyroxine (SYNTHROID) 88 MCG tablet Take 1 tablet (88 mcg total) by mouth daily.  30 tablet  11  . Nutritional Supplements (JEVITY) LIQD Take 4 Cans by mouth  daily.  400 Can  PRN  . phenytoin (DILANTIN) 125 MG/5ML suspension 4 ml by mouth via g-tube three times a day      . primidone (MYSOLINE) 250 MG tablet 0.5 tablets (125 mg total) by Per J Tube route 3 (three) times daily.      . ranitidine (ZANTAC) 15 MG/ML syrup 1tsp via g-tube BID      . valproate (DEPAKENE) 250 MG/5ML syrup 13mL po q6hr       Review Of Systems: unable to obtain due to pt not verbal  Physical Exam: Pulse: 117  Blood Pressure: 122/77 RR: 20   O2: 92 on RA Temp: 100.2  General: responsive to touch, appears comfortable, sleeping HEENT: sclera clear, anicteric and neck supple with midline trachea Heart: S1 and S2 normal, regular rate and rhythm Lungs: coarse breath sounds throughout lung fields Abdomen: abdomen is soft without significant tenderness, masses, organomegaly or guarding Extremities: extremities normal, atraumatic, no cyanosis or edema Skin:no rashes Neurology: quadriparesis noted  Labs and Imaging: Lab Results  Component Value Date/Time   NA 135 10/05/2010 12:04 AM   K 3.5 10/05/2010 12:04 AM   CL 95* 10/05/2010 12:04 AM   CO2 30 06/23/2010  8:29 PM   BUN 11 10/05/2010 12:04 AM   CREATININE 0.70 10/05/2010 12:04 AM   CREATININE 0.56 06/23/2010  2:58 PM  GLUCOSE 171* 10/05/2010 12:04 AM   Lab Results  Component Value Date   WBC 18.9* 10/04/2010   HGB 12.9 10/05/2010   HCT 38.0 10/05/2010   MCV 96.0 10/04/2010   PLT 291 10/04/2010   CXR right  Lower lobe PNA   Assessment and Plan: Megan [name] is a 30 y.o. year old female presenting with cough 1. Cough- CAP vs aspiration PNA.  Similar in presentation to previous PNA admissions.  Usually does well with avelox.  Will start avelox.  Will wean O2 to goal sat of 92% 2.  Blind/severe MR- will put in tele room with continuous pulse ox in case pt has trouble breathing. 3.  Hypothyroidism- continue synthroid 4.  Seizures- home meds 5. FEN/GI: jevity 4 cans per tube q day.  D51/2 NS at 157ml/hr as pt appears  dry 6. Prophylaxis: heparin 4. Disposition: pending clinical improvement

## 2010-10-06 LAB — BASIC METABOLIC PANEL
BUN: 8 mg/dL (ref 6–23)
CO2: 32 mEq/L (ref 19–32)
Chloride: 99 mEq/L (ref 96–112)
Creatinine, Ser: <0.47 mg/dL (ref 0.4–1.2)
Glucose, Bld: 158 mg/dL — ABNORMAL HIGH (ref 70–99)
Potassium: 3.7 mEq/L (ref 3.5–5.1)

## 2010-10-06 LAB — CBC
HCT: 28.3 % — ABNORMAL LOW (ref 36.0–46.0)
Hemoglobin: 9.4 g/dL — ABNORMAL LOW (ref 12.0–15.0)
MCV: 96.9 fL (ref 78.0–100.0)
RBC: 2.92 MIL/uL — ABNORMAL LOW (ref 3.87–5.11)
RDW: 16.7 % — ABNORMAL HIGH (ref 11.5–15.5)
WBC: 13 10*3/uL — ABNORMAL HIGH (ref 4.0–10.5)

## 2010-10-10 ENCOUNTER — Ambulatory Visit (INDEPENDENT_AMBULATORY_CARE_PROVIDER_SITE_OTHER): Payer: Medicaid Other | Admitting: Family Medicine

## 2010-10-10 ENCOUNTER — Encounter: Payer: Self-pay | Admitting: Family Medicine

## 2010-10-10 VITALS — BP 120/85 | HR 97 | Temp 98.1°F

## 2010-10-10 DIAGNOSIS — T17910A Gastric contents in respiratory tract, part unspecified causing asphyxiation, initial encounter: Secondary | ICD-10-CM

## 2010-10-10 DIAGNOSIS — G809 Cerebral palsy, unspecified: Secondary | ICD-10-CM

## 2010-10-10 DIAGNOSIS — E039 Hypothyroidism, unspecified: Secondary | ICD-10-CM

## 2010-10-10 DIAGNOSIS — J189 Pneumonia, unspecified organism: Secondary | ICD-10-CM

## 2010-10-10 DIAGNOSIS — T17308A Unspecified foreign body in larynx causing other injury, initial encounter: Secondary | ICD-10-CM

## 2010-10-10 NOTE — Assessment & Plan Note (Signed)
Will check TSH and make med adjustments if needed.

## 2010-10-10 NOTE — Progress Notes (Signed)
S: Pt here today for hospital f/u. Was d/c'ed 6/11 w/ abx course for PNA, likely aspiration. Per pt's grandmother/care giver/ POA, pt is doing much better. No fevers since d/c. Still having productive cough but is improving, must more interactive, back to her normal self. Grandmother also got a suction machine at home to help with secretions.   Had discussion w/ grandmother, who is POA about code status.  She does not want any extraordinary measures, including NO intubation and NO resuscitation if patient were to code.  Explained that we will continue with medication treatments, such as antibiotics, but grandmother does not want her "hooked up to machines."   O: afbrile, VSS Gen: NAD, awake, looking around, smiles at grandmother HEENT: MMM CV: RRR Pulm: lungs CTAB, no wheezes, rales, or crackles although difficult to get good respiratory effort; no increased WOB or accessory muscle use Ext: Left hand midly edematous, per gmom had an IV there, otherwise no edema.  A/P: -PNA: improving, continue avelox for 2 week course -Left hand: will monitor, if does not improve or becomes erythematous or warm, told grandmother to bring pt back so we can look at it -checking TSH as pt diagnosed w/ hypothyroidism 05/2010 and started on synthroid but has not had f/u TSH; will adjust meds if needed -code status discussed with HCPOA: DNR/DNI -f/u PRN

## 2010-10-10 NOTE — Assessment & Plan Note (Signed)
Improving clinically, cont avelox. Likely 2/2 aspiration.

## 2010-10-13 ENCOUNTER — Encounter: Payer: Self-pay | Admitting: Family Medicine

## 2010-10-13 NOTE — Discharge Summary (Signed)
  NAMEEVOLEHT, HOVATTER NO.:  [id number]  MEDICAL RECORD NO.:  [id number]  LOCATION:  [facility]                         FACILITY:  [facility]  PHYSICIAN:  Santiago Bumpers. Asianna Brundage, M.D.DATE OF BIRTH:  1980-06-10  DATE OF ADMISSION:  10/04/2010 DATE OF DISCHARGE:  10/06/2010                              DISCHARGE SUMMARY   DISCHARGE DIAGNOSES: 1. Right lower lobe pneumonia, likely aspiration induced. 2. Cerebral palsy and mental retardation. 3. Blindness. 4. Seizure disorder. 5. Hypothyroidism. 6. History of aspiration pneumonia. 7. G-tube dependent.  DISCHARGE MEDICATIONS: 1. Moxifloxacin 400 mg tablet per tube daily x8 days. 2. Tylenol 650 mg per tube q.6 h. p.r.n. 3. Jevity 1.2 calories nutritional supplement 237 mL per tube q.i.d. 4. Levothyroxine 88 mcg per tube q.a.m. 5. Dilantin 125 mg/5 mL solution 4 mL t.i.d. 6. Primidone 350 mg 1/2 tablet p.o. b.i.d. 7. Ranitidine 15 mg/mL solution 1 teaspoon p.o. b.i.d. 8. Valproic acid 250 mg/5 mL solution 13 mL p.o. q.6 h.  LABORATORY DATA AND IMAGING:  White blood count of 16 on admission and 13 at time of discharge.  Chest x-ray showing right lower lobe pneumonia.  BRIEF HOSPITAL COURSE:  This is a 30 year old female with severe mental retardation and cerebral palsy, blindness and quadriplegia who presented with chronic cough and found to have pneumonia. 1. Pneumonia.  Given the patient's history and G-tube dependence, most     likely this represents recurrent aspiration pneumonia.  She     presented with cough of several weeks and chest x-ray film findings     as above.  On admission, the patient had a leukocytosis of 18 and     was started on azithromycin in the emergency department which was     transitioned to Avelox to cover for aspiration-type organisms.  The     patient had a transient O2 requirement on admission and this was     weaned rather quickly and was stable on room air prior to     discharge.  Her  leukocytosis had improved to 13,000 and she will     continue Avelox for a total of 10-day course.  We will defer to PCP     for a followup chest x-ray to evaluate persistence of opacity. 2. History of seizure disorder.  No changes were made to the patient's     seizure medications.  DISCHARGE INSTRUCTIONS:  The patient was discharged to home with instructions to continue her G-tube feeds as previously and for her caretaker to call clinic or return to the emergency department for fevers related to 101, worsening of her respiratory status, and vomiting or any other concerns.  FOLLOWUP APPOINTMENTS:  With Dr. Demetria Pore at [facility] on October 08, 2010 at 3:00 p.m.  CONDITION ON DISCHARGE:  The patient is discharged to home in stable medical condition.    ______________________________ Lloyd Huger, MD   ______________________________ Santiago Bumpers. Leveda Anna, M.D.    Cleotis Lema  D:  10/06/2010  T:  10/07/2010  Job:  [id number]  Electronically Signed by Lloyd Huger MD on 10/11/2010 10:24:18 PM Electronically Signed by Doralee Albino M.D. on 10/13/2010 09:36:19 AM

## 2010-11-03 ENCOUNTER — Emergency Department (HOSPITAL_COMMUNITY)
Admission: EM | Admit: 2010-11-03 | Discharge: 2010-11-03 | Disposition: A | Discharge status: Home / Self Care | Payer: Medicaid Other | Attending: Emergency Medicine | Admitting: Emergency Medicine

## 2010-11-03 ENCOUNTER — Emergency Department (HOSPITAL_COMMUNITY): Payer: Medicaid Other

## 2010-11-03 ENCOUNTER — Other Ambulatory Visit: Payer: Self-pay | Admitting: Family Medicine

## 2010-11-03 ENCOUNTER — Encounter: Payer: Self-pay | Admitting: Family Medicine

## 2010-11-03 DIAGNOSIS — I10 Essential (primary) hypertension: Secondary | ICD-10-CM

## 2010-11-03 DIAGNOSIS — R569 Unspecified convulsions: Secondary | ICD-10-CM

## 2010-11-03 DIAGNOSIS — G808 Other cerebral palsy: Secondary | ICD-10-CM | POA: Insufficient documentation

## 2010-11-03 DIAGNOSIS — Z931 Gastrostomy status: Secondary | ICD-10-CM | POA: Insufficient documentation

## 2010-11-03 DIAGNOSIS — G40309 Generalized idiopathic epilepsy and epileptic syndromes, not intractable, without status epilepticus: Secondary | ICD-10-CM | POA: Insufficient documentation

## 2010-11-03 DIAGNOSIS — J189 Pneumonia, unspecified organism: Secondary | ICD-10-CM | POA: Insufficient documentation

## 2010-11-03 DIAGNOSIS — J159 Unspecified bacterial pneumonia: Secondary | ICD-10-CM

## 2010-11-03 DIAGNOSIS — R1310 Dysphagia, unspecified: Secondary | ICD-10-CM | POA: Insufficient documentation

## 2010-11-03 DIAGNOSIS — F79 Unspecified intellectual disabilities: Secondary | ICD-10-CM

## 2010-11-03 LAB — BASIC METABOLIC PANEL
BUN: 10 mg/dL (ref 6–23)
Chloride: 96 mEq/L (ref 96–112)
Creatinine, Ser: <0.47 mg/dL — ABNORMAL LOW (ref 0.50–1.10)
Glucose, Bld: 73 mg/dL (ref 70–99)
Potassium: 4.2 mEq/L (ref 3.5–5.1)

## 2010-11-03 LAB — CBC
HCT: 32.9 % — ABNORMAL LOW (ref 36.0–46.0)
Hemoglobin: 11.2 g/dL — ABNORMAL LOW (ref 12.0–15.0)
MCHC: 34 g/dL (ref 30.0–36.0)
MCV: 96.2 fL (ref 78.0–100.0)

## 2010-11-03 LAB — DIFFERENTIAL
Basophils Absolute: 0 10*3/uL (ref 0.0–0.1)
Lymphocytes Relative: 35 % (ref 12–46)
Lymphs Abs: 2 10*3/uL (ref 0.7–4.0)
Monocytes Absolute: 0.8 10*3/uL (ref 0.1–1.0)
Neutro Abs: 2.7 10*3/uL (ref 1.7–7.7)

## 2010-11-03 MED ORDER — AMOXICILLIN-POT CLAVULANATE 200-28.5 MG PO CHEW: 1.0000 | CHEWABLE_TABLET | Freq: Two times a day (BID) | ORAL | Status: AC

## 2010-11-03 MED ORDER — CIPROFLOXACIN HCL 500 MG PO TABS: 500.0000 mg | ORAL_TABLET | Freq: Two times a day (BID) | ORAL | Status: AC

## 2010-11-03 NOTE — H&P (Signed)
Family Medicine Teaching [facility] Admission History and Physical  Patient name: EDITHA BRIDGEFORTH Medical record number: [id number] Date of birth: April 22, 1981 Age: 30 y.o. Gender: female  Primary Care Provider: Demetria Pore, MD  Chief Complaint: possible pneumonia History of Present Illness: NOELL LORENSEN is a 30 y.o. year old female presenting with recent admission for pneumonia less than 1 month ago, pt was doing a little better but still has been having a productive cough and started to be a little more tired than usual.  Pt was at school today had one coughing spell that made her short of breath and was asked to be seen here.  Pt though once here was feeling better.  Up to yesterday was acting like herself. Mom and caregiver now thinks she looks like herself. In ED had x-ray showed residual pneumonia left, been on avelox for last 14 days.    Denies fever, chills, nausea vomiting abdominal pain, dysuria, chest pain, shortness of breath dyspnea on exertion or numbness in extremities by family and has been acting like herself even now.    Patient Active Problem List  Diagnoses  . CEREBRAL PALSY  . BLINDNESS, BILATERAL  . Seizure  . Aspiration of stomach contents  . Hypertension  . Hypothyroidism  . Pneumonia   Past Medical History: Past Medical History  Diagnosis Date  . Cerebral palsy, quadriplegic   . Cortical blindness   . Seizures   . Mental retardation   . Cerebral palsy   . Hypertension 06/23/2010    Past Surgical History: Past Surgical History  Procedure Date  . Peg tube placement     Social History: History   Social History  . Marital Status: Single    Spouse Name: N/A    Number of Children: N/A  . Years of Education: N/A   Social History Main Topics  . Smoking status: Never Smoker   . Smokeless tobacco: Never Used  . Alcohol Use: No  . Drug Use: No  . Sexually Active: No   Other Topics Concern  . Not on file   Social History Narrative  . No  narrative on file    Family History: Family History  Problem Relation Age of Onset  . Diabetes Mother   . Diabetes Maternal Grandmother     Allergies: Allergies  Allergen Reactions  . Doxycycline     REACTION: Blisters / swelling    Current Outpatient Prescriptions  Medication Sig Dispense Refill  . levothyroxine (SYNTHROID) 88 MCG tablet Take 1 tablet (88 mcg total) by mouth daily.  30 tablet  11  . Nutritional Supplements (JEVITY) LIQD Take 4 Cans by mouth daily.  400 Can  PRN  . phenytoin (DILANTIN) 125 MG/5ML suspension 4 ml by mouth via g-tube three times a day      . primidone (MYSOLINE) 250 MG tablet 0.5 tablets (125 mg total) by Per J Tube route 3 (three) times daily.      . ranitidine (ZANTAC) 15 MG/ML syrup 1tsp via g-tube BID      . valproate (DEPAKENE) 250 MG/5ML syrup 13mL po q6hr       Review Of Systems: Per HPI with the following additions:  Otherwise 12 point review of systems was performed and was unremarkable.  Physical Exam: Pulse: 84  Blood Pressure: 124/*82 RR: 16   O2: 97 on RA Temp: AF  General: responsive to touch, appears comfortable, sleeping  HEENT: sclera clear, anicteric and neck supple with midline trachea MMM Heart: S1 and S2  normal, regular rate and rhythm  Lungs: coarse breath sounds throughout  With mild rhonchi right greater than left but good air movement.  Abdomen: abdomen is soft without significant tenderness, masses, organomegaly or guarding  Extremities: extremities normal, atraumatic, no cyanosis or edema  Skin:no rashes  Neurology: quadriparesis noted  Labs and Imaging: Lab Results  Component Value Date/Time   NA 136 11/03/2010  5:15 PM   K 4.2 11/03/2010  5:15 PM   CL 96 11/03/2010  5:15 PM   CO2 34* 11/03/2010  5:15 PM   BUN 10 11/03/2010  5:15 PM   CREATININE <0.47* 11/03/2010  5:15 PM   CREATININE 0.56 06/23/2010  2:58 PM   GLUCOSE 73 11/03/2010  5:15 PM   Lab Results  Component Value Date   WBC 5.7 11/03/2010   HGB 11.2* 11/03/2010     HCT 32.9* 11/03/2010   MCV 96.2 11/03/2010   PLT 227 11/03/2010   X-ray shows residual pneumonia.    Assessment and Plan: AMENAH TUCCI is a 30 y.o. year old female presenting with possible residual pneumonia and productive cough 1. Pneumonia-  Pt is stable on room air, still able to tolerate tube feeds cough has white discharge but otherwise normal no fever and looks normal at this time.  Discussed at length with family on plan and that bringing pt into the hospital could increase risk of worsening infection and would only do medicine that I can give per tube, pt family then wanted to take pt home Family given instructions to call for appointment later this week gave red flags and gave presription for cipro and augmentin.   4. Disposition: d/c home with close follow up.

## 2010-11-06 ENCOUNTER — Encounter: Payer: Self-pay | Admitting: Family Medicine

## 2010-11-06 ENCOUNTER — Ambulatory Visit (INDEPENDENT_AMBULATORY_CARE_PROVIDER_SITE_OTHER): Payer: Medicaid Other | Admitting: Family Medicine

## 2010-11-06 VITALS — BP 124/85 | HR 85 | Temp 97.5°F

## 2010-11-06 DIAGNOSIS — Z23 Encounter for immunization: Secondary | ICD-10-CM

## 2010-11-06 DIAGNOSIS — J189 Pneumonia, unspecified organism: Secondary | ICD-10-CM

## 2010-11-06 MED ORDER — CETIRIZINE HCL 1 MG/ML PO SYRP: 5.0000 mg | ORAL_SOLUTION | Freq: Every day | ORAL | Status: DC

## 2010-11-06 NOTE — Patient Instructions (Signed)
Good to see you Continue the antibiotics until they are gone.  I will discuss with your main doctor on the possibility of continuing medication at a low dose to keep her from having pneumonias.   I am giving her a new medicine called zyrtec.  Take 10mg  per tube at night. This should help some with the secretions.  You should probably be seen again in 1 month to make sure everything is going well.

## 2010-11-06 NOTE — Progress Notes (Signed)
  Subjective:    Patient ID: Megan [name], female    DOB: 10/12/1980, 30 y.o.   MRN: [id number]  HPI Pt here today for hospital f/u. Was d/c'ed 6/11 w/ abx course for PNA, likely aspiration, pt seen again by me in ED last week given more antibiotics due ton continue coughing with production and sent home.  Since that time has been improving and seems to be doing well overall. Still having cough with clear spit up.   Per pt's grandmother/care giver/ POA, pt is doing much better. No fevers since d/c. Still having productive cough but is improving, must more interactive, back to her normal self. Grandmother also got a suction machine at home to help with secretions.   Review of Systems Denies fever, chills, nausea abdominal pain, dysuria, chest pain, shortness of breath dyspnea on exertion or numbness in extremities Past medical history, social, surgical and family history all reviewed.      Objective:   Physical Exam    General: responsive to touch, appears comfortable, sleeping  HEENT: sclera clear, anicteric and neck supple with midline trachea MMM  Heart: S1 and S2 normal, regular rate and rhythm  Lungs: coarse breath sounds throughout With mild rhonchi right greater than left but good air movement and marked improvement from last visit.   Abdomen: abdomen is soft without significant tenderness, masses, organomegaly or guarding  Extremities: extremities normal, atraumatic, no cyanosis or edema  Skin:no rashes  Neurology: quadriparesis noted     Assessment & Plan:

## 2010-11-06 NOTE — Assessment & Plan Note (Signed)
Continue current treatment with antibiotics.  Would consider low dose antibiotics but will leave up to PCP if she decides to do this as ppx. Pt given zyrtec to help with some secretions to continue suction and then would consider other antisecretion medications if it continues.

## 2010-11-11 NOTE — H&P (Signed)
Megan [name] NO.:  [id number]  MEDICAL RECORD NO.:  [id number]  LOCATION:  [facility]                         FACILITY:  [facility]  PHYSICIAN:  Santiago Bumpers. Rylann Munford, M.D.DATE OF BIRTH:  16-Feb-1981  DATE OF ADMISSION:  10/05/2010 DATE OF DISCHARGE:                             HISTORY & PHYSICAL   PRIMARY CARE PROVIDER:  Dr. Fara Boros at [facility].  CHIEF COMPLAINT:  Cough.  HISTORY OF PRESENT ILLNESS:  Megan [name] is a 30 year old female with significant history of quadriparesis, blindness, seizure, and severe mental retardation who presents today with cough productive white sputum x3 weeks.  She developed fever today of 100.2, so the family brought her in for evaluation.  She is severely mentally retarded, so she is unable to answer questions and family was not present when I examined the patient.  PAST MEDICAL HISTORY: 1. Cerebral palsy. 2. Quadriplegia. 3. Cortical blindness. 4. Seizures. 5. Mental retardation. 6. Hypertension.  SURGICAL HISTORY:  PEG tube placement.  SOCIAL HISTORY:  Single, lives with grandmother, nonsmoker, nondrinker. No drug use.  FAMILY HISTORY:  Significant for diabetes in the mother and maternal grandmother.  ALLERGIES:  DOXYCYCLINE.  CURRENT MEDICATIONS: 1. Synthroid 88 mcg daily. 2. Jevity 4 cans by mouth daily. 3. Dilantin 4 mL of 125 mg/5 mL suspension t.i.d. 4. Primidone 125 mg per G-tube 3 times a day. 5. Zantac 15 mg/mL 1 teaspoon via G-tube b.i.d. 6. Valproate 250/5 mL syrup 13 mL p.o. q.6 h.  REVIEW OF SYSTEMS:  Unable to obtain due to the patient not being verbal.  No other family member is present.  PHYSICAL EXAMINATION:  VITAL SIGNS;  Pulse 117, blood pressure 122/77, respirations 20, saturating 92% on room air, and temperature 100.2. GENERAL:  She is responsive to touch, appears comfortable, and sleeping. HEENT:  Sclerae are clear and anicteric. NECK:  Supple with midline trachea. HEART:  S1  and S2 are normal.  Regular rate and rhythm. LUNGS:  Coarse breath sounds throughout the lung fields. ABDOMEN:  Soft without significant tenderness, masses, organomegaly, or guarding. EXTREMITIES:  Normal, atraumatic.  No cyanosis or edema. SKIN:  No rashes. NEUROLOGIC:  Quadriparesis noted.  LABORATORY DATA AND IMAGES:  BMET was significant for a glucose of 171. CBC was significant for a white count of 18.9, hemoglobin 12.9, and platelets 291.  Chest x-ray showed a right lower lobe pneumonia.  ASSESSMENT/PLAN:  Megan [name] is a 30 year old female who presents with cough. 1. Cough, community-acquired pneumonia versus aspiration pneumonia.     She has had a history of aspiration pneumonia and presented     similarly on previous admissions.  Essentially, she does well with     the course of Avelox.  We will start Avelox today and wean her O2     to a goal sat of 92%. 2. Blind, severe MR.  We will put her in tele room with continuous     pulse ox in case the patient has trouble breathing. 3. Hypothyroidism.  Continue Synthroid. 4. Seizures.  Home medications. 5. Fluids, electrolytes, nutrition and gastrointestinal.  Jevity 4     cans per tube daily.  D5 half normal saline  at 100 mL per hour as     the patient currently appears dry. 6. Prophylaxis.  Heparin subcu. 7. Disposition.  Pending clinical improvement.     Megan Plunk, MD   ______________________________ Santiago Bumpers Megan [name], M.D.    RS/MEDQ  D:  10/05/2010  T:  10/05/2010  Job:  [id number]  Electronically Signed by Megan [name]  on 11/05/2010 11:42:31 AM Electronically Signed by Doralee Albino M.D. on 11/11/2010 07:45:46 AM

## 2010-11-17 NOTE — Consult Note (Signed)
NAME:  Megan [name], Megan [name] NO.:  [id number]  MEDICAL RECORD NO.:  [id number]  LOCATION:  [facility]                         FACILITY:  [facility]  PHYSICIAN:  Pearlean Brownie, M.D.DATE OF BIRTH:  Sep 18, 1980  DATE OF CONSULTATION:  11/03/2010 DATE OF DISCHARGE:  11/03/2010                                CONSULTATION   PRIMARY CARE PROVIDER:  Dr. Despina Hick at [facility] ([place]).  CHIEF COMPLAINT:  Possible pneumonia.  HISTORY OF PRESENT ILLNESS:  Megan [name] is a 30 year old female who has a history of cerebral palsy with recent admissions for pneumonia less than a month ago who had been doing little better while taking her Avelox as outpatient treatment.  The patient though continues to have a productive cough and started to be a little more tired than usual per family as well as back at school where she goes daily, the patient's school stated today that she had a coughing spell that seemed to make her short of breath and that she was transferred into the emergency department at that time.  Mom and caregiver at bedside just states that she has not been acting quite like herself, but the sputum actually has changed from a yellow color to a more white color and seem to be actually improving and coughing maybe a little less of overall.  The patient did have an x- ray in the ED which did show a residual pneumonia on the left compared to the last admission x-ray appears to be stable and actually improving. The patient's caregiver denies any type of fevers or chills.  No nausea or vomiting with this, has not been to be complaining of any abdominal pain, does not seem shortness of breath on exertion or at rest, only with coughing and no other change in her mood other than stated  PAST MEDICAL HISTORY: 1. Cerebral palsy. 2. Cortical blindness. 3. Seizure disorder. 4. Mental retardation. 5. Hypertension. 6. Hypothyroidism. 7. Recurrent  pneumonia.  SURGICAL HISTORY:  The patient did have a PEG tube placed.  PHYSICAL EXAMINATION:  VITAL SIGNS:  The patient's pulse 84, blood pressure 124/82, respiration rate 16, the patient is 97% on room air and afebrile. GENERAL:  The patient is responsive to touch, appears comfortable, sleeping actually when I went to the patient, but alert and awake. HEENT:  Sclerae clear, anicteric. NECK:  Seems to be supple with midline trachea and moist mucous membranes at this time. HEART:  S1 and S2 is normal with a regular rate and rhythm that is appreciated. LUNGS:  The patient has coarse breath sounds throughout with a mild rhonchi on the right greater than left with good air movement throughout with no true focal findings. ABDOMEN:  Soft without any significant tenderness, masses or organomegaly.  No guarding is to be noted. EXTREMITIES:  Normal.  The patient is atraumatic.  No cyanosis or edema. Family though states that her hands look swollen to her at this time. Do not appreciate any swelling. SKIN:  No rashes. NEUROLOGIC:  Exhibits quadriparesis at rest and unable to do more of a neurologic exam.  PERTINENT LABS:  The patient's sodium 136, potassium 4.2, chloride 96,  bicarb 34, BUN 10, creatinine less than 0.47 and glucose 73.  The patient's CBC has a white blood cell count of 5.4, hemoglobin 11.2, hematocrit of 32.9 and platelet of 227.  The patient does have a chest x- ray that shows residual pneumonia but does not appear to show any worsening.  ASSESSMENT AND PLAN:  Ms. Kingma is a 30 year old female with cerebral palsy who had possible residual pneumonia and a productive cough. Pneumonia.  The patient seems to be stable on room air, afebrile, is tolerating tube feeds very well.  Cough is white discharge but otherwise normal.  No fever.  Looked at patient's baseline when family asked overall.  Discussed at length with the family on the plan of possibly bringing the patient into  the hospital but likely would not do anything other than oral medications change and monitor from there.  With the patient being pretty much immunocompromised, I felt that this could actually increase chance for worsening infection and family decided that it would be better to go home with antibiotics and close followup with primary care provider.  They wanted to take the patient home.  Family was given instructions, written out, to call for an appointment later this week, to have one for the 15th but one sooner would be better. Gave them red flags to look out.  Gave prescriptions of 2 new prescriptions of Cipro and Augmentin covering fairly broadly but did feel with aspiration should cover for more gram-negatives as well.  If the patient does not improve, I would add something that would cover a little more anaerobe coverage.  Seems the patient was on clindamycin in the past as well, but maybe we will consider something like Flagyl added to this regimen.  We will continue this regimen for 7 days.  The patient will be discharged home at this time with the plan as stated above.     Megan Primas, DO   ______________________________ Pearlean Brownie, M.D.    ZS/MEDQ  D:  11/04/2010  T:  11/04/2010  Job:  [id number]  Electronically Signed by Megan [name]  on 11/07/2010 04:05:37 PM Electronically Signed by Pearlean Brownie M.D. on 11/17/2010 01:58:17 PM

## 2011-01-14 LAB — I-STAT 8, (EC8 V) (CONVERTED LAB)
BUN: 10
Bicarbonate: 30.6 — ABNORMAL HIGH
Glucose, Bld: 73
Hemoglobin: 17 — ABNORMAL HIGH
pCO2, Ven: 65.5 — ABNORMAL HIGH
pH, Ven: 7.278

## 2011-01-14 LAB — DIFFERENTIAL
Basophils Absolute: 0
Basophils Relative: 1
Eosinophils Absolute: 0.4
Eosinophils Relative: 8 — ABNORMAL HIGH
Monocytes Absolute: 0.3

## 2011-01-14 LAB — BASIC METABOLIC PANEL
CO2: 28
Calcium: 8.5
GFR calc Af Amer: >60
GFR calc non Af Amer: >60
Sodium: 138

## 2011-01-14 LAB — COMPREHENSIVE METABOLIC PANEL
Albumin: 4
Alkaline Phosphatase: 109
BUN: 9
Potassium: 3.8
Sodium: 135
Total Protein: 8.6 — ABNORMAL HIGH

## 2011-01-14 LAB — CBC
HCT: 43.2
Hemoglobin: 12.1
Hemoglobin: 14.6
MCHC: 33.7
MCHC: 33.9
RBC: 3.68 — ABNORMAL LOW
RDW: 14.3

## 2011-01-14 LAB — POCT I-STAT CREATININE
Creatinine, Ser: 0.7
Operator id: 234501

## 2011-01-14 LAB — HEMOGLOBIN A1C: Hgb A1c MFr Bld: 4.5 — ABNORMAL LOW

## 2011-01-20 LAB — CBC
HCT: 37.7
HCT: 43
Hemoglobin: 13.4
Hemoglobin: 14.5
MCHC: 33.7
MCV: 96.6
Platelets: 266
RBC: 3.9
RDW: 13.3
WBC: 8.7

## 2011-01-20 LAB — DIFFERENTIAL
Basophils Absolute: 0
Basophils Absolute: 0
Basophils Relative: 0
Basophils Relative: 0
Eosinophils Absolute: 0.1
Eosinophils Absolute: 0.3
Eosinophils Relative: 1
Eosinophils Relative: 3
Lymphocytes Relative: 30
Lymphs Abs: 1.5
Monocytes Absolute: 0.3
Monocytes Absolute: 1

## 2011-01-20 LAB — PHENYTOIN LEVEL, TOTAL
Phenytoin Lvl: 17.5
Phenytoin Lvl: <2.5 — ABNORMAL LOW

## 2011-01-20 LAB — URINE MICROSCOPIC-ADD ON

## 2011-01-20 LAB — URINALYSIS, ROUTINE W REFLEX MICROSCOPIC
Glucose, UA: NEGATIVE
pH: 6

## 2011-01-20 LAB — POCT I-STAT, CHEM 8
BUN: 6
Calcium, Ion: 1.18
Chloride: 105
Creatinine, Ser: 0.6
Glucose, Bld: 79
HCT: 42
Potassium: 3.6

## 2011-01-20 LAB — URINE CULTURE: Colony Count: NO GROWTH

## 2011-01-21 LAB — CBC
HCT: 31.9 — ABNORMAL LOW
HCT: 32.5 — ABNORMAL LOW
HCT: 34 — ABNORMAL LOW
Hemoglobin: 11.4 — ABNORMAL LOW
Hemoglobin: 12
Hemoglobin: 13.3
Hemoglobin: 15.5 — ABNORMAL HIGH
Hemoglobin: 10.3 — ABNORMAL LOW
MCHC: 33.6
MCHC: 33.8
MCHC: 33.8
MCHC: 34.6
MCHC: 33.4
MCV: 98
MCV: 99.4
MCV: 99.5
Platelets: 211
Platelets: 238
Platelets: 262
Platelets: 272
Platelets: 398
RBC: 3.42 — ABNORMAL LOW
RBC: 3.84 — ABNORMAL LOW
RBC: 3.87
RBC: 4.67
RBC: 3.03 — ABNORMAL LOW
RBC: 3.7 — ABNORMAL LOW
RDW: 13.7
RDW: 13.9
RDW: 13.9
WBC: 7.3
WBC: 7.6
WBC: 7.9
WBC: 8.2
WBC: 14 — ABNORMAL HIGH
WBC: 5.3

## 2011-01-21 LAB — BASIC METABOLIC PANEL
BUN: 1 — ABNORMAL LOW
BUN: 2 — ABNORMAL LOW
BUN: 4 — ABNORMAL LOW
BUN: <1 — ABNORMAL LOW
BUN: <1 — ABNORMAL LOW
BUN: <1 — ABNORMAL LOW
BUN: 13
BUN: 6
CO2: 24
CO2: 26
CO2: 26
CO2: 27
Calcium: 7.8 — ABNORMAL LOW
Calcium: 8.6
Calcium: 8.7
Calcium: 8.7
Calcium: 9.3
Calcium: 8.4
Chloride: 104
Chloride: 105
Chloride: 109
Chloride: 102
Chloride: 104
Creatinine, Ser: 0.41
Creatinine, Ser: 0.47
Creatinine, Ser: 0.54
Creatinine, Ser: 0.57
Creatinine, Ser: 0.58
Creatinine, Ser: 0.65
Creatinine, Ser: 0.44
Creatinine, Ser: 0.44
Creatinine, Ser: 0.65
GFR calc Af Amer: >60
GFR calc Af Amer: >60
GFR calc Af Amer: >60
GFR calc Af Amer: >60
GFR calc non Af Amer: >60
GFR calc non Af Amer: >60
GFR calc non Af Amer: >60
GFR calc non Af Amer: >60
Glucose, Bld: 101 — ABNORMAL HIGH
Glucose, Bld: 86
Glucose, Bld: 89
Glucose, Bld: 95
Glucose, Bld: 92
Glucose, Bld: 99
Potassium: 3.9
Potassium: 4.1
Potassium: 4.4
Potassium: 3.5
Potassium: 3.8
Potassium: 4.5
Sodium: 142
Sodium: 136
Sodium: 138
Sodium: 141

## 2011-01-21 LAB — BLOOD GAS, ARTERIAL
Acid-Base Excess: 0.4
Acid-base deficit: 2.1 — ABNORMAL HIGH
FIO2: 1
Mode: POSITIVE
O2 Saturation: 96.4
TCO2: 25.4
pCO2 arterial: 37.5
pO2, Arterial: 86

## 2011-01-21 LAB — CULTURE, BLOOD (ROUTINE X 2): Culture: NO GROWTH

## 2011-01-21 LAB — POCT I-STAT 3, ART BLOOD GAS (G3+)
Bicarbonate: 23.7
Bicarbonate: 24.2 — ABNORMAL HIGH
Bicarbonate: 25 — ABNORMAL HIGH
Bicarbonate: 30.6 — ABNORMAL HIGH
O2 Saturation: 99
Operator id: 261491
Operator id: 234111
Patient temperature: 100.1
Patient temperature: 100.8
TCO2: 25
TCO2: 25
TCO2: 26
TCO2: 32
pCO2 arterial: 37.9
pCO2 arterial: 40.9
pCO2 arterial: 42
pCO2 arterial: 43.6
pCO2 arterial: 44.1
pH, Arterial: 7.364
pH, Arterial: 7.371
pH, Arterial: 7.381
pH, Arterial: 7.449 — ABNORMAL HIGH
pO2, Arterial: 155 — ABNORMAL HIGH
pO2, Arterial: 164 — ABNORMAL HIGH
pO2, Arterial: 511 — ABNORMAL HIGH
pO2, Arterial: 185 — ABNORMAL HIGH

## 2011-01-21 LAB — URINE CULTURE
Culture: NO GROWTH
Culture: NO GROWTH

## 2011-01-21 LAB — VALPROIC ACID LEVEL
Valproic Acid Lvl: 53.5
Valproic Acid Lvl: 62.4
Valproic Acid Lvl: 66.9
Valproic Acid Lvl: 72
Valproic Acid Lvl: 72.4
Valproic Acid Lvl: 72.5

## 2011-01-21 LAB — CULTURE, BAL-QUANTITATIVE W GRAM STAIN: Colony Count: 4000

## 2011-01-21 LAB — PHOSPHORUS: Phosphorus: 2.9

## 2011-01-21 LAB — COMPREHENSIVE METABOLIC PANEL
ALT: 37 — ABNORMAL HIGH
ALT: 37 — ABNORMAL HIGH
ALT: 42 — ABNORMAL HIGH
AST: 42 — ABNORMAL HIGH
AST: 43 — ABNORMAL HIGH
Albumin: 2.9 — ABNORMAL LOW
Albumin: 3.1 — ABNORMAL LOW
Alkaline Phosphatase: 72
Alkaline Phosphatase: 76
CO2: 25
CO2: 26
CO2: 28
Calcium: 7.9 — ABNORMAL LOW
Calcium: 8.2 — ABNORMAL LOW
Calcium: 8.9
Chloride: 106
Chloride: 107
Creatinine, Ser: 0.49
Creatinine, Ser: 0.53
GFR calc Af Amer: >60
GFR calc Af Amer: >60
GFR calc Af Amer: >60
GFR calc non Af Amer: >60
GFR calc non Af Amer: >60
GFR calc non Af Amer: >60
Glucose, Bld: 108 — ABNORMAL HIGH
Glucose, Bld: 109 — ABNORMAL HIGH
Glucose, Bld: 67 — ABNORMAL LOW
Potassium: 3.2 — ABNORMAL LOW
Potassium: 3.7
Sodium: 140
Sodium: 141
Sodium: 139
Total Bilirubin: 0.4
Total Protein: 6.2

## 2011-01-21 LAB — DIFFERENTIAL
Basophils Absolute: 0
Basophils Relative: 0
Eosinophils Absolute: 0.4
Eosinophils Absolute: 0.3
Eosinophils Relative: 2
Eosinophils Relative: 5
Eosinophils Relative: 3
Lymphocytes Relative: 19
Lymphocytes Relative: 38
Lymphocytes Relative: 22
Lymphs Abs: 3.1
Monocytes Absolute: 1.9 — ABNORMAL HIGH
Monocytes Relative: 10
Monocytes Relative: 14 — ABNORMAL HIGH
Neutro Abs: 3.5
Neutrophils Relative %: 65

## 2011-01-21 LAB — URINE MICROSCOPIC-ADD ON

## 2011-01-21 LAB — URINALYSIS, ROUTINE W REFLEX MICROSCOPIC
Bilirubin Urine: NEGATIVE
Bilirubin Urine: NEGATIVE
Hgb urine dipstick: NEGATIVE
Ketones, ur: NEGATIVE
Nitrite: NEGATIVE
Nitrite: NEGATIVE
Protein, ur: NEGATIVE
Protein, ur: NEGATIVE
Specific Gravity, Urine: 1.024
Urobilinogen, UA: 1
Urobilinogen, UA: 1
pH: 7.5
pH: 8

## 2011-01-21 LAB — PHENYTOIN LEVEL, FREE AND TOTAL
Phenytoin Bound: 7.3
Phenytoin, Free: 1.3 (ref 1.00–2.00)
Phenytoin, Free: 1.59 (ref 1.00–2.00)
Phenytoin, Total: 8.6 — ABNORMAL LOW (ref 10.0–20.0)

## 2011-01-21 LAB — MAGNESIUM
Magnesium: 1.7
Magnesium: 1.9
Magnesium: 2.2

## 2011-01-21 LAB — PHENOBARBITAL LEVEL
Phenobarbital: 15.5
Phenobarbital: 16.1

## 2011-01-21 LAB — PRIMIDONE AND METABOLITE LEVEL
Primidone, Serum: 1 — ABNORMAL LOW (ref 5–12)
Primidone, Serum: 3 — ABNORMAL LOW (ref 5–12)

## 2011-01-21 LAB — AMYLASE: Amylase: 774 — ABNORMAL HIGH

## 2011-01-21 LAB — PHENYTOIN LEVEL, TOTAL
Phenytoin Lvl: 15.5
Phenytoin Lvl: 9.5 — ABNORMAL LOW

## 2011-01-21 LAB — ALBUMIN: Albumin: 2.8 — ABNORMAL LOW

## 2011-01-23 LAB — BASIC METABOLIC PANEL
BUN: 7
Calcium: 9.2
Creatinine, Ser: 0.83
GFR calc Af Amer: >60
GFR calc non Af Amer: >60

## 2011-01-23 LAB — URINE MICROSCOPIC-ADD ON

## 2011-01-23 LAB — POCT I-STAT, CHEM 8
BUN: 10
Calcium, Ion: 1 — ABNORMAL LOW
Chloride: 98
Creatinine, Ser: 1.1
Glucose, Bld: 81
HCT: 48 — ABNORMAL HIGH
Hemoglobin: 16.3 — ABNORMAL HIGH
Potassium: 4.5
Sodium: 134 — ABNORMAL LOW
TCO2: 32

## 2011-01-23 LAB — DIFFERENTIAL
Basophils Absolute: 0
Basophils Relative: 1
Eosinophils Absolute: 0.1
Eosinophils Absolute: 0.1
Eosinophils Relative: 2
Lymphocytes Relative: 24
Lymphocytes Relative: 39
Lymphs Abs: 2
Lymphs Abs: 3
Monocytes Absolute: 0.8
Monocytes Relative: 10
Neutro Abs: 3.7
Neutro Abs: 5.2
Neutrophils Relative %: 48
Neutrophils Relative %: 64

## 2011-01-23 LAB — URINALYSIS, ROUTINE W REFLEX MICROSCOPIC
Glucose, UA: NEGATIVE
Ketones, ur: 15 — AB
Nitrite: NEGATIVE
Protein, ur: NEGATIVE
Specific Gravity, Urine: 1.026
Urobilinogen, UA: 1
pH: 7.5

## 2011-01-23 LAB — URINE CULTURE
Colony Count: NO GROWTH
Culture: NO GROWTH

## 2011-01-23 LAB — CBC
HCT: 42.7
Hemoglobin: 14.1
MCHC: 33.1
MCV: 96
Platelets: 432 — ABNORMAL HIGH
Platelets: ADEQUATE
RBC: 4.45
RDW: 20.3 — ABNORMAL HIGH
WBC: 7.8
WBC: 8.2

## 2011-01-23 LAB — POCT PREGNANCY, URINE: Preg Test, Ur: NEGATIVE

## 2011-01-26 LAB — CBC
HCT: 33.5 — ABNORMAL LOW
HCT: 26.8 — ABNORMAL LOW
HCT: 26.9 — ABNORMAL LOW
HCT: 31.7 — ABNORMAL LOW
HCT: 35 — ABNORMAL LOW
Hemoglobin: 11 — ABNORMAL LOW
Hemoglobin: 13.8
Hemoglobin: 10 — ABNORMAL LOW
Hemoglobin: 10.4 — ABNORMAL LOW
Hemoglobin: 11.3 — ABNORMAL LOW
Hemoglobin: 9 — ABNORMAL LOW
Hemoglobin: 9 — ABNORMAL LOW
MCHC: 33.1
MCHC: 33.4
MCHC: 33.9
MCHC: 32.8
MCHC: 33.3
MCHC: 33.5
MCV: 98.6
MCV: 99.2
MCV: 100.3 — ABNORMAL HIGH
MCV: 100.5 — ABNORMAL HIGH
MCV: 100.6 — ABNORMAL HIGH
MCV: 98.8
MCV: 99.6
Platelets: 199
Platelets: 210
Platelets: 216
Platelets: 251
Platelets: 253
Platelets: 292
Platelets: 327
Platelets: 363
Platelets: 377
Platelets: ADEQUATE
RBC: 2.89 — ABNORMAL LOW
RBC: 3.04 — ABNORMAL LOW
RBC: 3.45 — ABNORMAL LOW
RBC: 4.23
RBC: 2.7 — ABNORMAL LOW
RBC: 2.71 — ABNORMAL LOW
RBC: 2.97 — ABNORMAL LOW
RBC: 3.16 — ABNORMAL LOW
RDW: 17.5 — ABNORMAL HIGH
RDW: 17.8 — ABNORMAL HIGH
RDW: 17.8 — ABNORMAL HIGH
RDW: 18.3 — ABNORMAL HIGH
WBC: 10.5
WBC: 14 — ABNORMAL HIGH
WBC: 15.4 — ABNORMAL HIGH
WBC: 5.8
WBC: 5.9
WBC: 14.5 — ABNORMAL HIGH
WBC: 17 — ABNORMAL HIGH
WBC: 4.9
WBC: 4.9
WBC: 6.1
WBC: 7.2
WBC: 9.1

## 2011-01-26 LAB — DIFFERENTIAL
Basophils Absolute: 0
Basophils Absolute: 0
Basophils Absolute: 0.1
Basophils Relative: 0
Basophils Relative: 0
Basophils Relative: 1
Basophils Relative: 1
Eosinophils Absolute: 0
Eosinophils Absolute: 0
Eosinophils Absolute: 0.1
Eosinophils Absolute: 0.4
Eosinophils Relative: 0
Eosinophils Relative: 0
Eosinophils Relative: 2
Eosinophils Relative: 3
Eosinophils Relative: 7 — ABNORMAL HIGH
Lymphocytes Relative: 13
Lymphocytes Relative: 34
Lymphocytes Relative: 36
Lymphocytes Relative: 5 — ABNORMAL LOW
Lymphocytes Relative: 55 — ABNORMAL HIGH
Lymphs Abs: 1.1
Lymphs Abs: 2
Lymphs Abs: 2.6
Lymphs Abs: 3.1
Lymphs Abs: 3.3
Monocytes Absolute: 2 — ABNORMAL HIGH
Monocytes Absolute: 0.7
Monocytes Absolute: 0.7
Monocytes Absolute: 0.9
Monocytes Absolute: 1.2 — ABNORMAL HIGH
Monocytes Relative: 12
Monocytes Relative: 13 — ABNORMAL HIGH
Monocytes Relative: 5
Neutro Abs: 1.6 — ABNORMAL LOW
Neutro Abs: 3.4
Neutro Abs: 4.6
Neutrophils Relative %: 83 — ABNORMAL HIGH
Neutrophils Relative %: 26 — ABNORMAL LOW
Neutrophils Relative %: 51
Neutrophils Relative %: 90 — ABNORMAL HIGH

## 2011-01-26 LAB — BASIC METABOLIC PANEL WITH GFR
BUN: 4 — ABNORMAL LOW
CO2: 28
Calcium: 7.8 — ABNORMAL LOW
Chloride: 100
Creatinine, Ser: 0.32 — ABNORMAL LOW
GFR calc non Af Amer: >60
Glucose, Bld: 74
Potassium: 3.5
Sodium: 133 — ABNORMAL LOW

## 2011-01-26 LAB — URINALYSIS, ROUTINE W REFLEX MICROSCOPIC
Bilirubin Urine: NEGATIVE
Hgb urine dipstick: NEGATIVE
Ketones, ur: 15 — AB
Nitrite: NEGATIVE
Nitrite: NEGATIVE
Protein, ur: NEGATIVE
Protein, ur: NEGATIVE
Specific Gravity, Urine: 1.035 — ABNORMAL HIGH
Urobilinogen, UA: 2 — ABNORMAL HIGH
pH: 7

## 2011-01-26 LAB — PROTIME-INR
INR: 1
INR: 1
INR: 1.2
INR: 1.3
INR: 1.3
INR: 1.5
INR: 1.6 — ABNORMAL HIGH
INR: 1.8 — ABNORMAL HIGH
INR: 1.9 — ABNORMAL HIGH
INR: 2 — ABNORMAL HIGH
INR: 2.3 — ABNORMAL HIGH
INR: 2.5 — ABNORMAL HIGH
INR: 2.8 — ABNORMAL HIGH
Prothrombin Time: 12.8
Prothrombin Time: 13.2
Prothrombin Time: 15.6 — ABNORMAL HIGH
Prothrombin Time: 16.7 — ABNORMAL HIGH
Prothrombin Time: 17.1 — ABNORMAL HIGH
Prothrombin Time: 18.4 — ABNORMAL HIGH
Prothrombin Time: 20.1 — ABNORMAL HIGH
Prothrombin Time: 22.9 — ABNORMAL HIGH
Prothrombin Time: 23.3 — ABNORMAL HIGH
Prothrombin Time: 24.3 — ABNORMAL HIGH
Prothrombin Time: 26.8 — ABNORMAL HIGH
Prothrombin Time: 28.5 — ABNORMAL HIGH
Prothrombin Time: 31.3 — ABNORMAL HIGH

## 2011-01-26 LAB — PHENYTOIN LEVEL, TOTAL
Phenytoin Lvl: 41.7
Phenytoin Lvl: 16.4
Phenytoin Lvl: 18.1
Phenytoin Lvl: 18.5
Phenytoin Lvl: 20.1 — ABNORMAL HIGH
Phenytoin Lvl: 4.5 — ABNORMAL LOW
Phenytoin Lvl: <2.5 — ABNORMAL LOW

## 2011-01-26 LAB — PREPARE FRESH FROZEN PLASMA

## 2011-01-26 LAB — VALPROIC ACID LEVEL
Valproic Acid Lvl: 72.4
Valproic Acid Lvl: 121.4 — ABNORMAL HIGH
Valproic Acid Lvl: 53.6
Valproic Acid Lvl: 58
Valproic Acid Lvl: 77.4
Valproic Acid Lvl: 77.8

## 2011-01-26 LAB — COMPREHENSIVE METABOLIC PANEL
ALT: 22
ALT: 26
ALT: 18
ALT: 51 — ABNORMAL HIGH
AST: 41 — ABNORMAL HIGH
AST: 24
AST: 83 — ABNORMAL HIGH
Albumin: 2.6 — ABNORMAL LOW
Albumin: 2.7 — ABNORMAL LOW
Alkaline Phosphatase: 88
CO2: 24
CO2: 28
CO2: 28
CO2: 31
Calcium: 7.8 — ABNORMAL LOW
Calcium: 8.9
Calcium: 9.1
Chloride: 103
Chloride: 97
Chloride: 109
Creatinine, Ser: 0.49
Creatinine, Ser: 0.37 — ABNORMAL LOW
GFR calc Af Amer: >60
GFR calc Af Amer: >60
GFR calc Af Amer: >60
GFR calc non Af Amer: >60
GFR calc non Af Amer: >60
GFR calc non Af Amer: >60
GFR calc non Af Amer: >60
Glucose, Bld: 219 — ABNORMAL HIGH
Glucose, Bld: 102 — ABNORMAL HIGH
Sodium: 134 — ABNORMAL LOW
Sodium: 137
Sodium: 137
Sodium: 143
Total Bilirubin: 0.6
Total Bilirubin: 1
Total Protein: 7.2

## 2011-01-26 LAB — BASIC METABOLIC PANEL
BUN: 1 — ABNORMAL LOW
BUN: 5 — ABNORMAL LOW
BUN: 8
CO2: 27
Calcium: 7.6 — ABNORMAL LOW
Calcium: 7.9 — ABNORMAL LOW
Chloride: 104
Chloride: 107
Creatinine, Ser: 0.37 — ABNORMAL LOW
Creatinine, Ser: <0.3 — ABNORMAL LOW
GFR calc Af Amer: >60
GFR calc Af Amer: >60
GFR calc non Af Amer: >60
GFR calc non Af Amer: >60
Glucose, Bld: 109 — ABNORMAL HIGH
Potassium: 3.4 — ABNORMAL LOW
Potassium: 4.2
Sodium: 136
Sodium: 144

## 2011-01-26 LAB — MAGNESIUM
Magnesium: 1.6
Magnesium: 1.7

## 2011-01-26 LAB — VITAMIN B12: Vitamin B-12: 1784 — ABNORMAL HIGH (ref 211–911)

## 2011-01-26 LAB — GLUCOSE, CAPILLARY
Glucose-Capillary: 115 — ABNORMAL HIGH
Glucose-Capillary: 131 — ABNORMAL HIGH
Glucose-Capillary: 134 — ABNORMAL HIGH
Glucose-Capillary: 135 — ABNORMAL HIGH
Glucose-Capillary: 145 — ABNORMAL HIGH
Glucose-Capillary: 145 — ABNORMAL HIGH
Glucose-Capillary: 161 — ABNORMAL HIGH
Glucose-Capillary: 49 — ABNORMAL LOW
Glucose-Capillary: 49 — ABNORMAL LOW
Glucose-Capillary: 92

## 2011-01-26 LAB — URINE MICROSCOPIC-ADD ON

## 2011-01-26 LAB — COMPREHENSIVE METABOLIC PANEL WITH GFR
ALT: 36 — ABNORMAL HIGH
AST: 35
Albumin: 2.3 — ABNORMAL LOW
Alkaline Phosphatase: 61
BUN: 4 — ABNORMAL LOW
CO2: 30
Calcium: 8.2 — ABNORMAL LOW
Chloride: 107
Creatinine, Ser: 0.39 — ABNORMAL LOW
GFR calc non Af Amer: >60
Glucose, Bld: 73
Potassium: 4.1
Sodium: 141
Total Bilirubin: 0.6
Total Protein: 5.9 — ABNORMAL LOW

## 2011-01-26 LAB — URINE CULTURE
Colony Count: NO GROWTH
Culture: NO GROWTH

## 2011-01-26 LAB — APTT: aPTT: 23 — ABNORMAL LOW

## 2011-01-26 LAB — CULTURE, BLOOD (ROUTINE X 2)

## 2011-01-26 LAB — PRIMIDONE AND METABOLITE LEVEL
Primidone, Serum: 4 — ABNORMAL LOW (ref 5–12)
Primidone, Serum: <2 — ABNORMAL LOW (ref 5–12)

## 2011-01-26 LAB — PHOSPHORUS: Phosphorus: 2.3

## 2011-01-26 LAB — CULTURE, RESPIRATORY W GRAM STAIN

## 2011-01-26 LAB — CK TOTAL AND CKMB (NOT AT ARMC): CK, MB: 1.5

## 2011-01-26 LAB — PHENOBARBITAL LEVEL: Phenobarbital: 17.8

## 2011-01-26 LAB — IRON AND TIBC
Iron: 78
Saturation Ratios: 30
UIBC: 179

## 2011-01-26 LAB — RENAL FUNCTION PANEL
Albumin: 2.4 — ABNORMAL LOW
BUN: 3 — ABNORMAL LOW
CO2: 27
Calcium: 7.9 — ABNORMAL LOW
Chloride: 102
GFR calc Af Amer: >60
GFR calc non Af Amer: >60
Glucose, Bld: 104 — ABNORMAL HIGH
Phosphorus: 1.9 — ABNORMAL LOW
Potassium: 3 — ABNORMAL LOW
Potassium: 3.6
Sodium: 138

## 2011-01-26 LAB — LEGIONELLA ANTIGEN, URINE

## 2011-01-26 LAB — RETICULOCYTES: RBC.: 3.24 — ABNORMAL LOW

## 2011-01-26 LAB — ABO/RH: ABO/RH(D): B POS

## 2011-02-02 ENCOUNTER — Telehealth: Payer: Self-pay | Admitting: *Deleted

## 2011-02-02 ENCOUNTER — Encounter: Payer: Self-pay | Admitting: Family Medicine

## 2011-02-02 ENCOUNTER — Ambulatory Visit (INDEPENDENT_AMBULATORY_CARE_PROVIDER_SITE_OTHER): Payer: Medicaid Other

## 2011-02-02 DIAGNOSIS — Z23 Encounter for immunization: Secondary | ICD-10-CM

## 2011-02-02 NOTE — Telephone Encounter (Signed)
grandmother request note for school to allow patient to take ibuprofen when she has periods, has lots of cramping.  [phone].

## 2011-03-09 ENCOUNTER — Other Ambulatory Visit: Payer: Self-pay | Admitting: Family Medicine

## 2011-03-09 NOTE — Telephone Encounter (Signed)
Refill request

## 2011-03-24 ENCOUNTER — Telehealth: Payer: Self-pay | Admitting: Family Medicine

## 2011-03-24 NOTE — Telephone Encounter (Signed)
Will forward to Dr McGill 

## 2011-03-24 NOTE — Telephone Encounter (Signed)
Aunt is calling requesting a letter giving permission for the school to give Megan [name] be faxed to [employer].  She didn't have the fax or phone number to give.

## 2011-03-24 NOTE — Telephone Encounter (Signed)
Unable to reach pt's aunt. Letter is up front for pick up. Lorenda Hatchet, [name]

## 2011-03-24 NOTE — Telephone Encounter (Signed)
Letter written and routed to [employer]. Thanks!

## 2011-05-27 ENCOUNTER — Ambulatory Visit (INDEPENDENT_AMBULATORY_CARE_PROVIDER_SITE_OTHER): Payer: Medicaid Other | Admitting: Family Medicine

## 2011-05-27 ENCOUNTER — Encounter: Payer: Self-pay | Admitting: Family Medicine

## 2011-05-27 DIAGNOSIS — G809 Cerebral palsy, unspecified: Secondary | ICD-10-CM

## 2011-05-27 DIAGNOSIS — Z0000 Encounter for general adult medical examination without abnormal findings: Secondary | ICD-10-CM

## 2011-05-27 NOTE — Patient Instructions (Signed)
It was great to see you and Megan [name]! She looks like she is doing great!  Please let me know if her neurologist makes any changes in March. Please bring her back in 1 year, sooner if needed!

## 2011-05-27 NOTE — Assessment & Plan Note (Signed)
Doing well, family without concerns or complaints.  No longer having issues with aspiration/pneumonia.  Soley fed via J-tube.  Family looks for skin breakdown daily and has not noted any ulcers or breakdown.  No fevers, no seizures x >1 year.  Sees neurologist in March.  Acting at her baseline per family.  Did not discuss code status today as pt's mother was no present (of note, this is a very stressful topic for family to discuss).  F/u 1 year for next annual exam, sooner if any issues.

## 2011-05-27 NOTE — Progress Notes (Signed)
  Subjective:     Megan [name] is a 31 y.o. female and is here for a comprehensive physical exam. The patient's sister/caregiver reports no problems.  History   Social History  . Marital Status: Single    Spouse Name: N/A    Number of Children: N/A  . Years of Education: N/A   Occupational History  . Not on file.   Social History Main Topics  . Smoking status: Never Smoker   . Smokeless tobacco: Never Used  . Alcohol Use: No  . Drug Use: No  . Sexually Active: No   Other Topics Concern  . Not on file   Social History Narrative  . No narrative on file   Health Maintenance  Topic Date Due  . Influenza Vaccine  01/26/2012  . Tetanus/tdap  11/05/2020  . Pap Smear  11/24/1997    The following portions of the patient's history were reviewed and updated as appropriate: allergies, current medications, past family history, past medical history, past social history, past surgical history and problem list.  Review of Systems Review of systems not obtained due to patient factors.  (Cerebral Palsy; per family there have been no issues or concerns)  Objective:    BP 91/63  Pulse 91 General appearance: no distress and syndromic appearance - CP Head: Normocephalic, without obvious abnormality, atraumatic Eyes: negative findings: conjunctivae and sclerae normal and pupils equal, round, reactive to light and accomodation, positive findings: disconjugate gaze (pt blind) Ears: normal TM's and external ear canals both ears Nose: Nares normal. Septum midline. Mucosa normal. No drainage or sinus tenderness. Throat: lips, mucosa, and tongue normal; teeth and gums normal Neck: no adenopathy, supple, symmetrical, trachea midline and thyroid not enlarged, symmetric, no tenderness/mass/nodules Lungs: clear to auscultation bilaterally Heart: regular rate and rhythm, S1, S2 normal, no murmur, click, rub or gallop Abdomen: soft, non-tender; bowel sounds normal; no masses,  no  organomegaly Extremities: extremities normal, atraumatic, no cyanosis or edema and bilteral upper ext contractures, at pt's baseline Pulses: 2+ and symmetric Skin: Skin color, texture, turgor normal. No rashes or lesions    Assessment:    Healthy female exam. [facility] paperwork filled out and returned to family.     Plan:     See After Visit Summary for Counseling Recommendations

## 2011-06-16 ENCOUNTER — Other Ambulatory Visit: Payer: Self-pay | Admitting: Family Medicine

## 2011-06-16 NOTE — Telephone Encounter (Signed)
Refill request

## 2011-07-20 ENCOUNTER — Encounter: Payer: Self-pay | Admitting: Family Medicine

## 2011-08-31 ENCOUNTER — Telehealth: Payer: Self-pay | Admitting: Family Medicine

## 2011-08-31 DIAGNOSIS — G809 Cerebral palsy, unspecified: Secondary | ICD-10-CM

## 2011-08-31 NOTE — Telephone Encounter (Signed)
Order and letter to GI are done and routed to [employer].

## 2011-08-31 NOTE — Telephone Encounter (Signed)
Need another feeding tube from GI practice.  Need to have referral sent to [facility] for an appt for patient.

## 2011-09-03 ENCOUNTER — Telehealth: Payer: Self-pay

## 2011-09-03 ENCOUNTER — Encounter (HOSPITAL_COMMUNITY)
Admission: RE | Disposition: A | Discharge status: Home Health | Payer: Self-pay | Source: Ambulatory Visit | Attending: Internal Medicine

## 2011-09-03 ENCOUNTER — Encounter (HOSPITAL_COMMUNITY): Payer: Self-pay | Admitting: Internal Medicine

## 2011-09-03 ENCOUNTER — Ambulatory Visit (HOSPITAL_COMMUNITY)
Admission: RE | Admit: 2011-09-03 | Discharge: 2011-09-03 | Disposition: A | Discharge status: Home Health | Payer: Medicaid Other | Source: Ambulatory Visit | Attending: Internal Medicine | Admitting: Internal Medicine

## 2011-09-03 DIAGNOSIS — I10 Essential (primary) hypertension: Secondary | ICD-10-CM | POA: Insufficient documentation

## 2011-09-03 DIAGNOSIS — G809 Cerebral palsy, unspecified: Secondary | ICD-10-CM

## 2011-09-03 DIAGNOSIS — R569 Unspecified convulsions: Secondary | ICD-10-CM | POA: Insufficient documentation

## 2011-09-03 DIAGNOSIS — K9429 Other complications of gastrostomy: Secondary | ICD-10-CM | POA: Insufficient documentation

## 2011-09-03 DIAGNOSIS — G808 Other cerebral palsy: Secondary | ICD-10-CM | POA: Insufficient documentation

## 2011-09-03 DIAGNOSIS — Y833 Surgical operation with formation of external stoma as the cause of abnormal reaction of the patient, or of later complication, without mention of misadventure at the time of the procedure: Secondary | ICD-10-CM | POA: Insufficient documentation

## 2011-09-03 HISTORY — PX: PEG PLACEMENT: SHX5437

## 2011-09-03 SURGERY — REPLACEMENT, PEG TUBE, WITHOUT ENDOSCOPY
Anesthesia: LOCAL

## 2011-09-03 NOTE — H&P (Signed)
Primary Care Physician:  Demetria Pore, MD, MD Primary Gastroenterologist:  Dr. Russella Dar  CHIEF COMPLAINT:  PEG needs changing  HPI: Megan [name] is a 31 y.o. female with hx of CP, seizures, HTN, chronic PEG who presents with her grandmother for PEG change.  She has had long-standing PEG tube for feeding.  Of late this has been leaking and "breaking down".  Leaking in the tubing (away from insertion site).  No abd pain, soreness, skin breakdown.  No fevers.    PEG was last changed in Oct 2009 by Dr. Russella Dar.  This was replaced endoscopically by pull method.    Past Medical History  Diagnosis Date  . Cerebral palsy, quadriplegic   . Cortical blindness   . Seizures   . Mental retardation   . Cerebral palsy   . Hypertension 06/23/2010    Past Surgical History  Procedure Date  . Peg tube placement     Prior to Admission medications   Medication Sig Start Date End Date Taking? Authorizing Provider  CETIRIZINE HCL CHILDRENS ALRGY 1 MG/ML SYRP TAKE (5 MG TOTAL) BY MOUTH DAILY 03/09/11   Jacquelyn A McGill, MD  levothyroxine (SYNTHROID, LEVOTHROID) 88 MCG tablet TAKE ONE (1) TABLET EACH DAY 06/16/11   Jacquelyn A McGill, MD  Nutritional Supplements (JEVITY) LIQD Take 4 Cans by mouth daily. 07/25/10   Jacquelyn A McGill, MD  phenytoin (DILANTIN) 125 MG/5ML suspension 4 ml by mouth via g-tube three times a day 06/06/10   Jacquelyn A McGill, MD  primidone (MYSOLINE) 250 MG tablet 0.5 tablets (125 mg total) by Per J Tube route 3 (three) times daily. 06/06/10   Jacquelyn A McGill, MD  ranitidine (ZANTAC) 15 MG/ML syrup 1tsp via g-tube BID 06/06/10   Jacquelyn A McGill, MD  valproate (DEPAKENE) 250 MG/5ML syrup 13mL po q6hr 06/06/10   Tito Dine, MD      Allergies as of 09/03/2011 - Review Complete 05/27/2011  Allergen Reaction Noted  . Doxycycline  04/01/2010    Family History  Problem Relation Age of Onset  . Diabetes Mother   . Diabetes Maternal Grandmother    SH -- no tob,  etoh, illicits.  Lives with family.  Requires full time care.  Review of Systems: As per HPI, otherwise negative  Physical Exam: Vital signs in last 24 hours: BP 124/84 HR 93 RR 21 O2 sat 100% RA Temp 97.5 axillary  Gen: awake, NAD HEENT: anicteric CV: RRR Pulm: CTA b/l anteriorly Abd: soft, NT/ND, +BS, PEG in place Ext: no c/c/e   Impression / Plan:   31 y.o. female with hx of CP, seizures, HTN, chronic PEG who presents with her grandmother for PEG change  1. PEG change -- pt has endoscopically placed, externally removable PEG tube which has degraded over time.  Will plan change out with 20 Fr balloon replacement PEG at bedside today.  Care instructions for this tube will be no different than previous PEG care the family has already been doing.  Once changed, she can f/u PRN    LOS: 0 days   Tauheed Mcfayden M  09/03/2011, 11:23 AM

## 2011-09-03 NOTE — Telephone Encounter (Signed)
Our clinic received call from [facility] outpatient clinic that the patient was having problems with PEG and it needed to be changed out today.  I have arranged with Dr Rhea Belton to do PEG exchange at [facility] today.  I spoke with a female family member and she will bring the patient to [facility] at 11:00 for PEG exchange.

## 2011-09-03 NOTE — Discharge Instructions (Signed)
Routine PEG care as you were previously Contact the GI office, [phone], with any questions or concerns.

## 2011-09-03 NOTE — Telephone Encounter (Signed)
Aunt is calling to check on the status for the GI Referral because the feeding tube is falling apart and they need a new one today.

## 2011-09-03 NOTE — Brief Op Note (Signed)
09/03/2011  11:48 AM  PATIENT:  Megan [name]  31 y.o. female  PRE-OPERATIVE DIAGNOSIS:  clogged peg  PROCEDURE:  Procedure(s) (LRB): PERCUTANEOUS ENDOSCOPIC GASTROSTOMY (PEG) REPLACEMENT (N/A)  SURGEON:  Surgeon(s) and Role:    * Beverley Fiedler, MD - Primary  Successful removal of previously placed endoscopic PEG.  Replaced with 24 Fr [employer] balloon PEG. Secured with bumper into position.  Patient tolerated very well without evidence for pain or discomfort.  D/C home Call office with questions or concerns.

## 2011-09-03 NOTE — Telephone Encounter (Signed)
Referral faxed to [facility] as urgent by Lupita Leash.Keli Buehner, Rodena Medin

## 2011-09-06 ENCOUNTER — Encounter (HOSPITAL_COMMUNITY): Payer: Self-pay | Admitting: *Deleted

## 2011-09-06 ENCOUNTER — Emergency Department (HOSPITAL_COMMUNITY)
Admission: EM | Admit: 2011-09-06 | Discharge: 2011-09-06 | Disposition: A | Discharge status: Home / Self Care | Payer: Medicaid Other | Attending: Emergency Medicine | Admitting: Emergency Medicine

## 2011-09-06 ENCOUNTER — Emergency Department (HOSPITAL_COMMUNITY): Payer: Medicaid Other

## 2011-09-06 DIAGNOSIS — Z79899 Other long term (current) drug therapy: Secondary | ICD-10-CM | POA: Insufficient documentation

## 2011-09-06 DIAGNOSIS — H47619 Cortical blindness, unspecified side of brain: Secondary | ICD-10-CM | POA: Insufficient documentation

## 2011-09-06 DIAGNOSIS — K9423 Gastrostomy malfunction: Secondary | ICD-10-CM | POA: Insufficient documentation

## 2011-09-06 DIAGNOSIS — G808 Other cerebral palsy: Secondary | ICD-10-CM | POA: Insufficient documentation

## 2011-09-06 DIAGNOSIS — I10 Essential (primary) hypertension: Secondary | ICD-10-CM | POA: Insufficient documentation

## 2011-09-06 DIAGNOSIS — F79 Unspecified intellectual disabilities: Secondary | ICD-10-CM | POA: Insufficient documentation

## 2011-09-06 NOTE — ED Notes (Signed)
6 ml of  sterile saline inserted in G tube without resistance or grimacing by pt. Hub flush with skin.  G tube secure. Care giver states G tube was always loose, came out today, caregiver re inserted. Dr Lynelle Doctor informed.

## 2011-09-06 NOTE — Discharge Instructions (Signed)
Keep the tube taped in place, and check  the tube daily to see if the balloon is still staying up. Her balloon may have a slow leak. If it starts getting loose please call Dr. Lauro Franklin office to have him replace the tube.

## 2011-09-06 NOTE — ED Notes (Addendum)
Pt from home accompanied by mother whom reports that pt's G-tube fell out this morning around 0700. Pt's mother reports that new G-tube was placed on Thursday.  Pt with hx of cerebral palsy, mental retardation and is nonverbal.

## 2011-09-06 NOTE — ED Provider Notes (Signed)
History     CSN: [id number]  Arrival date & time 09/06/11  1557   First MD Initiated Contact with Patient 09/06/11 1640      Chief Complaint  Patient presents with  . g-tube placement      G tube fell out    (Consider location/radiation/quality/duration/timing/severity/associated sxs/prior treatment) HPI  Patient has cerebral palsy with severe limitations. Her grandmother who is her caretaker relates she's had a PEG tube for many years. Her last PEG tube was inserted in 2009 and was replaced on May 9th by Dr. Rhea Belton. Grandmother states it worked well for 2 days however yesterday they noted it started slipping and today the tube came out. Grandmother immediately put it back in and taped in place. She relates it went back in without any resistance. Grandmother had to go to work today so they are now presenting to the ER for evaluation. Grandmother states the caretaker that was at home today did use to 2 (patient medications but they have not used it for tube feedings yet.  PCP [facility] Dr. Fara Boros GI Dr. Rhea Belton  Past Medical History  Diagnosis Date  . Cerebral palsy, quadriplegic   . Cortical blindness   . Seizures   . Mental retardation   . Cerebral palsy   . Hypertension 06/23/2010    Past Surgical History  Procedure Date  . Peg tube placement     Family History  Problem Relation Age of Onset  . Diabetes Mother   . Diabetes Maternal Grandmother     History  Substance Use Topics  . Smoking status: Never Smoker   . Smokeless tobacco: Never Used  . Alcohol Use: No   nonambulatory Lives at home  OB History    Grav Para Term Preterm Abortions TAB SAB Ect Mult Living                  Review of Systems  Unable to perform ROS: Other    Allergies  Doxycycline  Home Medications   Current Outpatient Rx  Name Route Sig Dispense Refill  . CETIRIZINE HCL CHILDRENS ALRGY 1 MG/ML PO SYRP  TAKE (5 MG TOTAL) BY MOUTH DAILY 480 mL 5  .  LEVOTHYROXINE SODIUM 88 MCG PO TABS  TAKE ONE (1) TABLET EACH DAY 30 tablet 11  . JEVITY PO LIQD Oral Take 4 Cans by mouth daily. 400 Can PRN    Use  4 cans daily through bolus feeding. 3 month s ...  . PHENYTOIN 125 MG/5ML PO SUSP  4 ml by mouth via g-tube three times a day    . PRIMIDONE 250 MG PO TABS Per J Tube 0.5 tablets (125 mg total) by Per J Tube route 3 (three) times daily.    Marland Kitchen RANITIDINE HCL 15 MG/ML PO SYRP  1tsp via g-tube BID    . VALPROATE SODIUM 250 MG/5ML PO SYRP  13mL po q6hr      BP 119/83  Pulse 87  Temp(Src) 97.4 F (36.3 C) (Axillary)  Resp 18  SpO2 100%  LMP 08/27/2011  Vital signs normal    Physical Exam  Nursing note and vitals reviewed. Constitutional: She appears well-developed and well-nourished.  Non-toxic appearance. She does not appear ill. No distress.  HENT:  Head: Atraumatic.  Right Ear: External ear normal.  Left Ear: External ear normal.  Nose: Nose normal. No mucosal edema or rhinorrhea.  Mouth/Throat: Mucous membranes are normal. No dental abscesses or uvula swelling.  Patient has atypical face is consistent with cerebral palsy or mental retardation  Eyes:       Eyes are slightly disconjugate  Neck: Full passive range of motion without pain.  Pulmonary/Chest: Effort normal and breath sounds normal. No respiratory distress. She has no rhonchi. She exhibits no crepitus.  Abdominal: Soft. Normal appearance and bowel sounds are normal. She exhibits no distension. There is no tenderness. There is no rebound and no guarding.       Grandmother has taped the PEG tube in place. When examined it is up to the guard.  Musculoskeletal: Normal range of motion. She exhibits no edema and no tenderness.       Moves all extremities well.   Neurological: She is alert. She has normal strength.       Patient is alert and smiling, she is nonverbal  Skin: Skin is warm, dry and intact. No rash noted. No erythema. No pallor.  Psychiatric: Her speech is  normal. Her mood appears not anxious.    ED Course  Procedures (including critical care time)  Nursing staff placed 6 cc of sterile saline in the balloon without difficulty. The tube appears to be anchored now. We are going to get a x-ray with contrast to verify placement. I have discussed with the grandmother there may be a slow leak in the balloon. She should keep it taped for the next couple days and check it to see if the balloon stops working. She should let Dr. Rhea Belton know so he could replace it if the balloon  has a slow leak.   09/03/2011  11:48 AM  PATIENT: Emelda Brothers 31 y.o. female  PRE-OPERATIVE DIAGNOSIS: clogged peg  PROCEDURE: Procedure(s) (LRB):  PERCUTANEOUS ENDOSCOPIC GASTROSTOMY (PEG) REPLACEMENT (N/A)  SURGEON: Surgeon(s) and Role:  * Beverley Fiedler, MD - Primary  Successful removal of previously placed endoscopic PEG. Replaced with 24 Fr [employer] balloon PEG. Secured with bumper into position.  Patient tolerated very well without evidence for pain or discomfort.   D/C home  Call office with questions or concerns.  Dg Abd 1 View  09/06/2011  *RADIOLOGY REPORT*  Clinical Data: Gastrostomy tube localization.  ABDOMEN - 1 VIEW  Comparison: 01/25/2010  Findings: Contrast medium is present in the stomach and in the distal portion of the gastrostomy tube, which appears to extend into the stomach body.  The appearance confirms gastric placement of the gastrostomy tube.  Levoconvex thoracolumbar scoliosis noted.  IMPRESSION:  1.  Contrast medium in the stomach noted, without leakage, indicating gastric placement of the gastrostomy tube. 2.  Scoliosis.  Original Report Authenticated By: Dellia Cloud, M.D.     1. PEG tube malfunction    Plan discharge  Devoria Albe, MD, FACEP    MDM          Ward Givens, MD 09/06/11 [phone]

## 2011-09-07 ENCOUNTER — Telehealth: Payer: Self-pay

## 2011-09-07 NOTE — Telephone Encounter (Signed)
[facility] Triage Call Report Triage Record Num: [id number] Operator: Judeen Hammans Patient Name: Megan [name] Call Date & Time: 09/06/2011 7:13:36AM Patient Phone: [phone] PCP: Patient Gender: Female PCP Fax : Patient DOB: [birth year] Practice Name: [facility] Reason for Call: Caller: Lynn/Aunt; PCP: Other; CB#: [phone]; Call regarding Feeding Tube Came Out 09/06/2011; Placed 09/03/11. Dr. Laury Axon notified; said to have caregiver take pt to ED to have tube re-placed. Caregiver will follow care advice. Protocol(s) Used: Office Note Recommended Outcome per Protocol: Information Noted and Sent to Office Reason for Outcome: Caller information to office Care Advice: ~ 09/06/2011 7:23:07AM Page 1 of 1 [id number]

## 2011-09-08 ENCOUNTER — Encounter (HOSPITAL_COMMUNITY): Payer: Self-pay | Admitting: Internal Medicine

## 2011-09-26 ENCOUNTER — Other Ambulatory Visit: Payer: Self-pay | Admitting: Family Medicine

## 2011-10-26 ENCOUNTER — Telehealth: Payer: Self-pay | Admitting: Family Medicine

## 2011-10-26 NOTE — Telephone Encounter (Signed)
Caregiver, Nicole Cella, was here earlier with a form I filled out and returned.  She has this form and is aware that physical therapy also needs to fill out part of it.  I assume this is the same form and I have done what needs to be completed.

## 2011-10-26 NOTE — Telephone Encounter (Signed)
[facility] is calling to check the status of the Certificate of Medical Necessity form that was faxed over.  She said that they got the initial CMN, but this was to begin the 1st of June and cover 6 months.  The Fax # [phone].

## 2011-11-16 ENCOUNTER — Encounter: Payer: Self-pay | Admitting: Family Medicine

## 2011-11-17 ENCOUNTER — Telehealth: Payer: Self-pay | Admitting: Family Medicine

## 2011-11-17 NOTE — Telephone Encounter (Signed)
Megan [name] calling back regarding 2nd request for form to be completed by provider for auth of medical equipment for patient.  Will fax for the third time.  Please complete and fax back to [phone].  If need to consult with rep please call number given at ext [phone].

## 2011-11-17 NOTE — Telephone Encounter (Signed)
I have filled out and faxed the form twice.  I will do it for a THIRD time tomorrow.

## 2011-11-18 NOTE — Telephone Encounter (Signed)
Filled out and placed in to-be-faxed pile. Megan [name] attempting to fax now.

## 2012-02-12 ENCOUNTER — Emergency Department (HOSPITAL_COMMUNITY): Admission: EM | Admit: 2012-02-12 | Payer: Medicaid Other

## 2012-02-12 ENCOUNTER — Other Ambulatory Visit: Payer: Self-pay | Admitting: Internal Medicine

## 2012-02-12 ENCOUNTER — Ambulatory Visit (HOSPITAL_COMMUNITY)
Admission: RE | Admit: 2012-02-12 | Discharge: 2012-02-12 | Disposition: A | Discharge status: Home / Self Care | Payer: Medicaid Other | Source: Ambulatory Visit | Attending: Internal Medicine | Admitting: Internal Medicine

## 2012-02-12 ENCOUNTER — Telehealth: Payer: Self-pay | Admitting: Internal Medicine

## 2012-02-12 DIAGNOSIS — K9423 Gastrostomy malfunction: Secondary | ICD-10-CM

## 2012-02-12 DIAGNOSIS — Z431 Encounter for attention to gastrostomy: Secondary | ICD-10-CM | POA: Insufficient documentation

## 2012-02-12 MED ORDER — IOHEXOL 300 MG/ML  SOLN: 7.0000 mL | Freq: Once | INTRAMUSCULAR | Status: DC | PRN

## 2012-02-12 NOTE — Telephone Encounter (Signed)
I agree, tube needs to be replaced ASAP

## 2012-02-12 NOTE — Telephone Encounter (Signed)
Called [facility] since Dr Rhea Belton is not here. They will see the pt if she comes right over. Number was busy for several minutes, but I finally got Ms. Young who will take the pt to [facility]. Dr Juanda Chance, the orders will come to you to be signed since you are the supervising doc today. Thanks.

## 2012-02-16 ENCOUNTER — Ambulatory Visit: Payer: Medicaid Other

## 2012-02-16 ENCOUNTER — Telehealth: Payer: Self-pay | Admitting: Internal Medicine

## 2012-02-17 NOTE — Telephone Encounter (Signed)
Ms Maple Hudson, pt's guardian, reports this tube is too had and the syringe pops off when they try to give pt meds, etc. The school pt attends and Ms Maple Hudson like the button tube better. Called Dr Juanda Chance who will replace the tube with a button tube on Friday, but I will need to order the button. I have found 2 op reports where a 18 french 2.5 CM butter was placed on 02/20/2003. Another report from 08/03/01, stated a 2.7 cm 20 [place] Mickey tube was placed. Will call [facility] tomorrow.

## 2012-02-17 NOTE — Telephone Encounter (Signed)
Spoke with Victorino Dike in IR who stated the pt's tube was loose, like the balloon was working last week. They took it out, verified patency and checked the balloon then replaced the same tube; there is no way to shorten it.

## 2012-02-17 NOTE — Telephone Encounter (Signed)
Spoke with someone who answered the phone because Megan [name] is at work. She reports the tube replaced last Friday, 02/12/12 is too long and gets in the way. I asked for clarification and she stated it gets into pt's diapers, etc., pt doesn't play with it, she's unable to do anything d/t CP. Megan [name] would like this scheduled for this Friday or next Monday when she is off. Spoke with Inetta Fermo in [facility] who stated the report states the original tube was loose; it was removed, balloon checked and put back in. She will talk with a tech and call me back.

## 2012-02-18 NOTE — Telephone Encounter (Signed)
Discussed with Dr. Juanda Chance she is able to do replacement tomorrow.  The case is scheduled for tomorrow to follow the last case.  Patient to arrive at 9:15 and register in outpatient registration.  I have given instructions to a family member [place].  She verbalized understanding for her to have no feedings after midnight and give her meds with as small amount of water as possible.

## 2012-02-18 NOTE — Telephone Encounter (Signed)
Agree with replacement as they requested. Can this be scheduled with the hospital MD this week?

## 2012-02-18 NOTE — Telephone Encounter (Signed)
Dr. Stark please review and advise 

## 2012-02-18 NOTE — Telephone Encounter (Signed)
Per Dr Rhea Belton, he used a 82 FR G tube for the pt, so we need a 24FR Mickey Button replacement. Called Jill at [facility] and she has 24FR 4.4 CM and 3.5CM Buttons available. Dr Rhea Belton called back and he only inserted the tube because he was hospital doc the week; the pt is a Dr Russella Dar pt.

## 2012-02-19 ENCOUNTER — Ambulatory Visit (HOSPITAL_COMMUNITY)
Admission: RE | Admit: 2012-02-19 | Discharge: 2012-02-19 | Disposition: A | Discharge status: Home / Self Care | Payer: Medicaid Other | Source: Ambulatory Visit | Attending: Internal Medicine | Admitting: Internal Medicine

## 2012-02-19 ENCOUNTER — Encounter (HOSPITAL_COMMUNITY)
Admission: RE | Disposition: A | Discharge status: Home / Self Care | Payer: Self-pay | Source: Ambulatory Visit | Attending: Internal Medicine

## 2012-02-19 ENCOUNTER — Encounter (HOSPITAL_COMMUNITY): Payer: Self-pay

## 2012-02-19 DIAGNOSIS — R633 Feeding difficulties, unspecified: Secondary | ICD-10-CM

## 2012-02-19 DIAGNOSIS — K9423 Gastrostomy malfunction: Secondary | ICD-10-CM | POA: Insufficient documentation

## 2012-02-19 DIAGNOSIS — I10 Essential (primary) hypertension: Secondary | ICD-10-CM | POA: Insufficient documentation

## 2012-02-19 DIAGNOSIS — G808 Other cerebral palsy: Secondary | ICD-10-CM | POA: Insufficient documentation

## 2012-02-19 HISTORY — PX: PEG PLACEMENT: SHX5437

## 2012-02-19 SURGERY — REPLACEMENT, PEG TUBE, WITHOUT ENDOSCOPY

## 2012-02-19 NOTE — Procedures (Signed)
Replacement of PEG, existing PEG 24 F BS replacement PEG the balloon was deflated ( 10cc's recovered), PEG was removed and a new 67F 3.5 cm button was placed  without difficulty,  Position and patency was checked  By running NaCl through the tubing.

## 2012-02-19 NOTE — Interval H&P Note (Signed)
History and Physical Interval Note:  02/19/2012 12:07 PM  [name]  has presented today for surgery, with the diagnosis of gtube needs changed to button  The various methods of treatment have been discussed with the patient and family. After consideration of risks, benefits and other options for treatment, the patient has consented to  Procedure(s) (LRB) with comments: PERCUTANEOUS ENDOSCOPIC GASTROSTOMY (PEG) REPLACEMENT (N/A) - needs gastrostomy button 24 Fr 4.4 and 3.5 available as a surgical intervention .  The patient's history has been reviewed, patient examined, no change in status, stable for surgery.  I have reviewed the patient's chart and labs.  Questions were answered to the patient's satisfaction.     Lina Sar

## 2012-02-19 NOTE — H&P (Signed)
  Megan [name] Aug 12, 1980 MRN [id number]        History of Present Illness:  This is a  31 yo with CP, malfunctioning PEG, family desires Button PEG, currently has 44F BS replacement gastrosotmy  Past Medical History  Diagnosis Date  . Cerebral palsy, quadriplegic   . Cortical blindness   . Seizures   . Mental retardation   . Cerebral palsy   . Hypertension 06/23/2010   Past Surgical History  Procedure Date  . Peg tube placement   . Peg placement 09/03/2011    Procedure: PERCUTANEOUS ENDOSCOPIC GASTROSTOMY (PEG) REPLACEMENT;  Surgeon: Beverley Fiedler, MD;  Location: [facility];  Service: Gastroenterology;  Laterality: N/A;    reports that she has never smoked. She has never used smokeless tobacco. She reports that she does not drink alcohol or use illicit drugs. family history includes Diabetes in her maternal grandmother and mother. Allergies  Allergen Reactions  . Doxycycline     REACTION: Blisters / swelling        Review of Systems:  The remainder of the 10 point ROS is negative except as outlined in H&P   Physical Exam: General appearance  Deformed  Torso, appears comfortable, unable to converse Lungs clear COR nl S1,S2, Abdomen PEG LUQ  Assessment and Plan:  Malfunctioning PEG, plan replacement with 3.5 cm, 44F BS button gastrostomy   02/19/2012 Megan [name]

## 2012-02-22 ENCOUNTER — Encounter (HOSPITAL_COMMUNITY): Payer: Self-pay | Admitting: Internal Medicine

## 2012-02-22 ENCOUNTER — Telehealth: Payer: Self-pay | Admitting: Internal Medicine

## 2012-02-22 NOTE — Telephone Encounter (Signed)
I have placed an extra bolus feeding set at the front desk for the family to pick up.  They will call if it won't work with the new button.

## 2012-03-16 ENCOUNTER — Other Ambulatory Visit: Payer: Self-pay | Admitting: Family Medicine

## 2012-03-28 ENCOUNTER — Encounter: Payer: Self-pay | Admitting: Family Medicine

## 2012-03-28 NOTE — Progress Notes (Signed)
Rx for "PT evaluation for mobility equipment" faxed to [employer] for new wheel chair.

## 2012-04-07 ENCOUNTER — Encounter: Payer: Self-pay | Admitting: Family Medicine

## 2012-04-07 ENCOUNTER — Ambulatory Visit (INDEPENDENT_AMBULATORY_CARE_PROVIDER_SITE_OTHER): Payer: Medicaid Other | Admitting: Family Medicine

## 2012-04-07 VITALS — BP 113/81 | HR 90 | Temp 97.7°F | Wt 98.0 lb

## 2012-04-07 DIAGNOSIS — J069 Acute upper respiratory infection, unspecified: Secondary | ICD-10-CM | POA: Insufficient documentation

## 2012-04-07 DIAGNOSIS — Z23 Encounter for immunization: Secondary | ICD-10-CM

## 2012-04-07 MED ORDER — CETIRIZINE HCL 5 MG/5ML PO SYRP: 10.0000 mg | ORAL_SOLUTION | Freq: Every day | ORAL | Status: DC

## 2012-04-07 NOTE — Assessment & Plan Note (Signed)
No fevers, lungs clear on limited exam today.  Rec nasal saline rinses w/ suction and increased Zyrtec.  Will continue to monitor.  Red flags for return including fever or increased WOB discussed with caregivers.

## 2012-04-07 NOTE — Patient Instructions (Signed)
It was good to see you today.  I will fill out your form and leave it up front for you to pick up.  For Megan [name]'s congestion and cough, we will increase her Zyrtec to 10mg  (10mL).  You can leave it at this dose forever or decrease it when she is sounding better.  Instead of a nasal spray, I actually think doing nasal saline will work better.  You can spray saline and suction her nose/mouth.  This should help with the congestion and mucus.  Please bring her back or go to the ER if she starts having fevers, isn't acting like herself, or looks like she is having a hard time breathing.

## 2012-04-07 NOTE — Progress Notes (Signed)
During visit for SDA for viral URI, patient's grandmother asked me to fill out a form for after [facility] physical.  Form was completed and placed in the to be called pile.  Please call [phone] to inform them that form is ready for pick up. Thanks!

## 2012-04-07 NOTE — Progress Notes (Signed)
S: Pt comes in today for SDA for congestion/cough.  Of note, patient has MRCP.  She is here today with grandmom and cousin.  She has been congested x2 weeks-- has tried OTC mucinex and Delsym but doesn't seem to be helping.  Still coughing up a lot of thick mucus.  No fevers.  +rhinorrhea.  Goes to adult day care program, no known sick contacts there.  Is at her baseline right now.  No N/V/D.  Biggest concern is mucus and wants to make sure it's not in her chest and lungs    ROS: Per HPI  History  Smoking status  . Never Smoker   Smokeless tobacco  . Never Used    O:  Filed Vitals:   04/07/12 1628  BP: 113/81  Pulse: 90  Temp: 97.7 F (36.5 C)    Gen: NAD, in wheelchair, + contractures, mouth open HEENT: MMM, mouth open with tongue half out (baseline for pt), + mild nasal mucosa erythema/irritation; unable to clearly visual oropharynx but no cervical LAD appreciated  CV: RRR, no murmur Pulm: CTA bilat, no wheezes or crackles but poor effort due to minimal ability to interact   A/P: 31 y.o. female p/w viral URI -See problem list -f/u in PRN

## 2012-04-28 ENCOUNTER — Encounter: Payer: Self-pay | Admitting: Family Medicine

## 2012-04-28 ENCOUNTER — Ambulatory Visit (INDEPENDENT_AMBULATORY_CARE_PROVIDER_SITE_OTHER): Payer: Medicaid Other | Admitting: Family Medicine

## 2012-04-28 VITALS — BP 120/79 | HR 100 | Temp 97.8°F | Resp 19

## 2012-04-28 DIAGNOSIS — R05 Cough: Secondary | ICD-10-CM | POA: Insufficient documentation

## 2012-04-28 DIAGNOSIS — R058 Other specified cough: Secondary | ICD-10-CM | POA: Insufficient documentation

## 2012-04-28 MED ORDER — GUAIFENESIN 100 MG/5ML PO LIQD: 200.0000 mg | Freq: Three times a day (TID) | ORAL | Status: DC | PRN

## 2012-04-28 NOTE — Patient Instructions (Addendum)
You may try mucinex liquid for secretions. Get chest xray done. I will call with results if antibiotic needed. Make appointment on Monday or Tuesday for recheck. If she has trouble breathing then go to ER.

## 2012-04-29 ENCOUNTER — Ambulatory Visit
Admission: RE | Admit: 2012-04-29 | Discharge: 2012-04-29 | Disposition: A | Discharge status: Home / Self Care | Payer: Medicaid Other | Source: Ambulatory Visit | Attending: Family Medicine | Admitting: Family Medicine

## 2012-04-29 ENCOUNTER — Other Ambulatory Visit: Payer: Self-pay | Admitting: Family Medicine

## 2012-04-29 ENCOUNTER — Telehealth: Payer: Self-pay | Admitting: Family Medicine

## 2012-04-29 DIAGNOSIS — R05 Cough: Secondary | ICD-10-CM

## 2012-04-29 MED ORDER — AMOXICILLIN-POT CLAVULANATE 875-125 MG PO TABS: 1.0000 | ORAL_TABLET | Freq: Two times a day (BID) | ORAL | Status: DC

## 2012-04-29 NOTE — Progress Notes (Signed)
  Subjective:    Patient ID: Megan [name], female    DOB: [birth year], 32 y.o.   MRN: [id number]  HPI  1. Cough. Mother and sister give history for patient with CP and profound MR. She was seen 12/12 for likely viral URI, cough with excessive secretions. Family notes this was stable, but for past week she is having worsening of cough and patient unable to clear the secretions. They are using oral suctioning for removal of some. They note subjective fevers, unmeasured. She seems slightly less energetic and doesn't smile as much.   She receives all meds through G-tube and her anti-histamine was increased last visit.   No sick contacts.  Review of Systems Deny increased work of breathing, noisy breathing, emesis, diarrhea, subjective pain indicators.    Objective:   Physical Exam  Vitals reviewed. Constitutional: No distress.       Wheelchair bound. Keeps eye closed. Does not respond to verbal commands.  HENT:  Right Ear: External ear normal.  Left Ear: External ear normal.  Nose: Nose normal.  Mouth/Throat: Oropharynx is clear and moist. No oropharyngeal exudate.       Thick clear secretions in posterior airway.  She coughs during exam.  Eyes: Conjunctivae normal are normal. Right eye exhibits no discharge. Left eye exhibits no discharge.  Neck: Neck supple. No thyromegaly present.       Hypertonicity and head tilts to right chronically.  Cardiovascular: Normal rate, regular rhythm and normal heart sounds.   No murmur heard. Pulmonary/Chest: Effort normal. She has no wheezes.       RR 19. No accessory muscle use. Unable to take deep breaths for good auscultation. No adventitious sounds auscultated.  Abdominal: Soft. She exhibits no distension.  Musculoskeletal: She exhibits no edema.  Neurological:       Nonverbal. No eye contact. No grunting.  Skin: No rash noted. She is not diaphoretic.       Assessment & Plan:

## 2012-04-29 NOTE — Telephone Encounter (Signed)
NO answer. Called regarding chest xr results. No definite pneumonia. Will treat 3 weeks worsening secretions for presumed sinusitis with empiric augmentin x 7 days.  F/u in clinic in one week with PCP.

## 2012-04-29 NOTE — Assessment & Plan Note (Addendum)
High risk for pneumonia with poor airway competence and history of aspiration. I am unable to rule out pneumonia on exam, but no signs of respiratory distress currently. Will check 2 view CXR to evaluate for infection. Given the worsening of cough/secretions and subjective fevers now going on 3 weeks, if there is not a pneumonia this would likely warrant treatment for a bacterial sinusitis at least. Will await film to determine best antibiotic choice. Family agrees to get film done in next 24 hrs. Ok to use delsym or robitussin may be better to break up phlegm and may consider decreasing dose of antihistamine as this could be making secretions too thick for suctioning. Follow up 3-5 days in clinic or sooner if worsens.

## 2012-05-02 NOTE — Telephone Encounter (Signed)
Called back and message given - appt set for 1/17

## 2012-05-02 NOTE — Telephone Encounter (Signed)
Attempted to call [name] who is patient's guardian. Phone rang and rang no answering machine

## 2012-05-05 ENCOUNTER — Ambulatory Visit: Payer: Medicaid Other | Attending: Family Medicine | Admitting: Physical Therapy

## 2012-05-05 DIAGNOSIS — G809 Cerebral palsy, unspecified: Secondary | ICD-10-CM | POA: Insufficient documentation

## 2012-05-05 DIAGNOSIS — IMO0001 Reserved for inherently not codable concepts without codable children: Secondary | ICD-10-CM | POA: Insufficient documentation

## 2012-05-05 DIAGNOSIS — M6281 Muscle weakness (generalized): Secondary | ICD-10-CM | POA: Insufficient documentation

## 2012-05-13 ENCOUNTER — Ambulatory Visit: Payer: Medicaid Other | Admitting: Family Medicine

## 2012-05-24 ENCOUNTER — Encounter: Payer: Self-pay | Admitting: Family Medicine

## 2012-05-30 ENCOUNTER — Telehealth: Payer: Self-pay | Admitting: Family Medicine

## 2012-05-30 NOTE — Telephone Encounter (Signed)
Caregiver is calling because they went to the dentist on 1/7 and the dentist wants her to take Biotene Gel.  She needs a letter for school stating that it is ok to spray it on her tongue.  It needs to be faxed to

## 2012-05-30 NOTE — Telephone Encounter (Signed)
Will fwd. To PCP. .Brecken Dewoody  

## 2012-05-30 NOTE — Telephone Encounter (Signed)
Fax # [phone] - attn: school nurse

## 2012-06-01 NOTE — Telephone Encounter (Signed)
Note written, printed, and placed in to be faxed pile. Also forwarded this to admin team. Thanks

## 2012-06-02 ENCOUNTER — Other Ambulatory Visit: Payer: Self-pay | Admitting: *Deleted

## 2012-06-02 MED ORDER — RANITIDINE HCL 75 MG/5ML PO SYRP: 75.0000 mg | ORAL_SOLUTION | Freq: Two times a day (BID) | ORAL | Status: DC

## 2012-06-14 ENCOUNTER — Telehealth: Payer: Self-pay | Admitting: Family Medicine

## 2012-06-14 NOTE — Telephone Encounter (Signed)
Will fwd. To PCP to address. Thank you. Lorenda Hatchet, [name]

## 2012-06-14 NOTE — Telephone Encounter (Signed)
Pt needs medicine that will dry up mucus that is coming up a lot or a referral.

## 2012-06-15 ENCOUNTER — Other Ambulatory Visit: Payer: Self-pay | Admitting: Family Medicine

## 2012-06-15 MED ORDER — GLYCOPYRROLATE 2 MG PO TABS: 2.0000 mg | ORAL_TABLET | Freq: Three times a day (TID) | ORAL | Status: DC | PRN

## 2012-06-15 NOTE — Telephone Encounter (Signed)
Called and informed of Rx sent to pharmacy.Busick, Rodena Medin

## 2012-06-15 NOTE — Telephone Encounter (Signed)
Sent in Robinol to help with secretions. F/u if not improving

## 2012-07-01 ENCOUNTER — Ambulatory Visit: Payer: Medicaid Other | Admitting: Family Medicine

## 2012-07-04 ENCOUNTER — Ambulatory Visit (INDEPENDENT_AMBULATORY_CARE_PROVIDER_SITE_OTHER): Payer: Medicaid Other | Admitting: Family Medicine

## 2012-07-04 ENCOUNTER — Encounter: Payer: Self-pay | Admitting: Family Medicine

## 2012-07-04 VITALS — BP 131/88 | HR 91 | Temp 98.0°F

## 2012-07-04 DIAGNOSIS — B9689 Other specified bacterial agents as the cause of diseases classified elsewhere: Secondary | ICD-10-CM

## 2012-07-04 DIAGNOSIS — J329 Chronic sinusitis, unspecified: Secondary | ICD-10-CM

## 2012-07-04 MED ORDER — AMOXICILLIN-POT CLAVULANATE 875-125 MG PO TABS: 1.0000 | ORAL_TABLET | Freq: Two times a day (BID) | ORAL | Status: DC

## 2012-07-04 NOTE — Assessment & Plan Note (Signed)
Rx Augmentin BID x1 week, f/u with PCP in one week to make sure she is improving.

## 2012-07-04 NOTE — Progress Notes (Signed)
  Subjective:    Patient ID: Megan [name], female    DOB: April 12, 1981, 32 y.o.   MRN: [id number]  HPI  Family members bring Chelle in for increased nasal congestion, sputum, subjective fevers that have worsened over past 3-4 days.  She has Cerebral Palsy and has had Pneumonia in the past.  They are worried because she does not cough very strong because of the CP.   Review of Systems See HPI    Objective:   Physical Exam BP 131/88  Pulse 91  Temp(Src) 98 F (36.7 C) (Oral)  SpO2 94% General appearance: Dysmorphic face, mouth open Mouth: visible yellow sputum on tongue, mucosa appears moist Nose + yellow mucous Lungs: Clear to ausculation bilaterally with referred upper airway noises CV: RRR, 2+ pulses.        Assessment & Plan:

## 2012-07-04 NOTE — Patient Instructions (Signed)
Megan [name] most likely has a bacterial sinus infection.  Please give her the antibiotic twice daily for one week and make a follow up appointment to see Dr. Fara Boros in one week.

## 2012-07-11 ENCOUNTER — Ambulatory Visit (INDEPENDENT_AMBULATORY_CARE_PROVIDER_SITE_OTHER): Payer: Medicaid Other | Admitting: Family Medicine

## 2012-07-11 ENCOUNTER — Encounter: Payer: Self-pay | Admitting: Family Medicine

## 2012-07-11 VITALS — BP 112/76 | HR 94 | Temp 97.5°F

## 2012-07-11 DIAGNOSIS — J069 Acute upper respiratory infection, unspecified: Secondary | ICD-10-CM

## 2012-07-11 NOTE — Progress Notes (Signed)
S: Pt comes in today for f/u.  Was seen 07/04/12 and diagnosed with bacterial sinusitis- started on 7 day course of amoxicillin, last day of abx is today.  Per family, she is doing a little better. No fevers.  Still with a lot of chest congestion.  Suctioning when she coughs.  Is a little more alert now than last week.     ROS: Per HPI  History  Smoking status  . Never Smoker   Smokeless tobacco  . Never Used    O:  Filed Vitals:   07/11/12 0937  BP: 112/76  Pulse: 94  Temp: 97.5 F (36.4 C)    Gen: NAD HEENT: lips/tongue mildly dry from mouth breathing; nasal mucosa normal in appearance without obvious rhinorrhea; throat non-erythematous and without exudate  CV: RRR, no murmur Pulm: Clear, transmitted upper airway noises but no appreciable wheezes, crackles, rhonchi   A/P: 32 y.o. female p/w resolving URI/sinusitis  -See problem list -f/u in PRN

## 2012-07-11 NOTE — Patient Instructions (Addendum)
It was good to see you today.  I'm glad Megan [name] is doing a little better.  Stop the robinol for now-- wait to restart it until she is a little less congested.  Keep using the suction.  You can also do some nasal saline rinses to help get some of the gunk out.  If she has a fever >100.4, we will get a check xray to make sure it hasn't developed into a pneumonia.  Bring her back if you feel like she is not getting better, but I bet her congestion will start getting better on its own!

## 2012-07-11 NOTE — Assessment & Plan Note (Signed)
Appears to be improving s/p 7 day augmentin for bacterial sinusitis.  Lungs clear- mostly transmitted upper airway noises.  No fevers and since improving will wait for further work up.  If has fever >100.4 or worsens, will have pt's family take her for CXR prior to repeat office visit to r/o PNA.  Otherwise, d/c robinol while congested to help with managing secretions/suction, continue suction, try nasal saline rinses.  F/u PRN.

## 2012-07-21 ENCOUNTER — Emergency Department (HOSPITAL_COMMUNITY): Payer: Medicaid Other

## 2012-07-21 ENCOUNTER — Encounter (HOSPITAL_COMMUNITY): Payer: Self-pay | Admitting: *Deleted

## 2012-07-21 ENCOUNTER — Inpatient Hospital Stay (HOSPITAL_COMMUNITY)
Admission: EM | Admit: 2012-07-21 | Discharge: 2012-07-25 | DRG: 177 | Disposition: A | Discharge status: Home / Self Care | Payer: Medicaid Other | Attending: Family Medicine | Admitting: Family Medicine

## 2012-07-21 DIAGNOSIS — T17918A Gastric contents in respiratory tract, part unspecified causing other injury, initial encounter: Secondary | ICD-10-CM

## 2012-07-21 DIAGNOSIS — R05 Cough: Secondary | ICD-10-CM | POA: Diagnosis present

## 2012-07-21 DIAGNOSIS — R569 Unspecified convulsions: Secondary | ICD-10-CM

## 2012-07-21 DIAGNOSIS — H543 Unqualified visual loss, both eyes: Secondary | ICD-10-CM

## 2012-07-21 DIAGNOSIS — J329 Chronic sinusitis, unspecified: Secondary | ICD-10-CM | POA: Diagnosis present

## 2012-07-21 DIAGNOSIS — R0902 Hypoxemia: Secondary | ICD-10-CM | POA: Diagnosis present

## 2012-07-21 DIAGNOSIS — G808 Other cerebral palsy: Secondary | ICD-10-CM | POA: Diagnosis present

## 2012-07-21 DIAGNOSIS — Z993 Dependence on wheelchair: Secondary | ICD-10-CM

## 2012-07-21 DIAGNOSIS — I10 Essential (primary) hypertension: Secondary | ICD-10-CM | POA: Diagnosis present

## 2012-07-21 DIAGNOSIS — R058 Other specified cough: Secondary | ICD-10-CM | POA: Diagnosis present

## 2012-07-21 DIAGNOSIS — E039 Hypothyroidism, unspecified: Secondary | ICD-10-CM | POA: Diagnosis present

## 2012-07-21 DIAGNOSIS — G809 Cerebral palsy, unspecified: Secondary | ICD-10-CM

## 2012-07-21 DIAGNOSIS — E162 Hypoglycemia, unspecified: Secondary | ICD-10-CM | POA: Diagnosis present

## 2012-07-21 DIAGNOSIS — G40909 Epilepsy, unspecified, not intractable, without status epilepticus: Secondary | ICD-10-CM | POA: Diagnosis present

## 2012-07-21 DIAGNOSIS — Z931 Gastrostomy status: Secondary | ICD-10-CM

## 2012-07-21 DIAGNOSIS — T17910D Gastric contents in respiratory tract, part unspecified causing asphyxiation, subsequent encounter: Secondary | ICD-10-CM

## 2012-07-21 DIAGNOSIS — T17910A Gastric contents in respiratory tract, part unspecified causing asphyxiation, initial encounter: Secondary | ICD-10-CM

## 2012-07-21 DIAGNOSIS — J189 Pneumonia, unspecified organism: Secondary | ICD-10-CM | POA: Diagnosis present

## 2012-07-21 DIAGNOSIS — H47619 Cortical blindness, unspecified side of brain: Secondary | ICD-10-CM | POA: Diagnosis present

## 2012-07-21 DIAGNOSIS — J690 Pneumonitis due to inhalation of food and vomit: Principal | ICD-10-CM | POA: Diagnosis present

## 2012-07-21 DIAGNOSIS — F79 Unspecified intellectual disabilities: Secondary | ICD-10-CM | POA: Diagnosis present

## 2012-07-21 DIAGNOSIS — E871 Hypo-osmolality and hyponatremia: Secondary | ICD-10-CM | POA: Diagnosis present

## 2012-07-21 DIAGNOSIS — Z79899 Other long term (current) drug therapy: Secondary | ICD-10-CM

## 2012-07-21 DIAGNOSIS — B9689 Other specified bacterial agents as the cause of diseases classified elsewhere: Secondary | ICD-10-CM | POA: Diagnosis present

## 2012-07-21 DIAGNOSIS — R633 Feeding difficulties: Secondary | ICD-10-CM

## 2012-07-21 LAB — CBC WITH DIFFERENTIAL/PLATELET
HCT: 35.3 % — ABNORMAL LOW (ref 36.0–46.0)
Hemoglobin: 11.2 g/dL — ABNORMAL LOW (ref 12.0–15.0)
Lymphocytes Relative: 10 % — ABNORMAL LOW (ref 12–46)
Monocytes Absolute: 2.4 10*3/uL — ABNORMAL HIGH (ref 0.1–1.0)
Monocytes Relative: 29 % — ABNORMAL HIGH (ref 3–12)
Neutro Abs: 5 10*3/uL (ref 1.7–7.7)
WBC: 8.4 10*3/uL (ref 4.0–10.5)

## 2012-07-21 LAB — BASIC METABOLIC PANEL
BUN: 9 mg/dL (ref 6–23)
CO2: 29 mEq/L (ref 19–32)
Chloride: 92 mEq/L — ABNORMAL LOW (ref 96–112)
Creatinine, Ser: 0.49 mg/dL — ABNORMAL LOW (ref 0.50–1.10)

## 2012-07-21 MED ORDER — ALBUTEROL SULFATE (5 MG/ML) 0.5% IN NEBU
5.0000 mg | INHALATION_SOLUTION | Freq: Once | RESPIRATORY_TRACT | Status: AC
  Administered 2012-07-21: 5 mg via RESPIRATORY_TRACT
  Filled 2012-07-21: qty 1

## 2012-07-21 MED ORDER — DEXTROSE 5 % IV SOLN
1.0000 g | Freq: Once | INTRAVENOUS | Status: AC
  Administered 2012-07-21: 1 g via INTRAVENOUS
  Filled 2012-07-21: qty 10

## 2012-07-21 MED ORDER — SODIUM CHLORIDE 0.9 % IV BOLUS (SEPSIS)
1000.0000 mL | Freq: Once | INTRAVENOUS | Status: AC
  Administered 2012-07-21: 1000 mL via INTRAVENOUS

## 2012-07-21 MED ORDER — DEXTROSE 5 % IV SOLN
500.0000 mg | Freq: Once | INTRAVENOUS | Status: AC
  Administered 2012-07-22: 500 mg via INTRAVENOUS
  Filled 2012-07-21: qty 500

## 2012-07-21 MED ORDER — IPRATROPIUM BROMIDE 0.02 % IN SOLN
0.5000 mg | Freq: Once | RESPIRATORY_TRACT | Status: AC
  Administered 2012-07-21: 0.5 mg via RESPIRATORY_TRACT
  Filled 2012-07-21: qty 2.5

## 2012-07-21 NOTE — ED Notes (Signed)
Pt's aunt, Nancee Liter: [phone]

## 2012-07-21 NOTE — ED Notes (Signed)
Attempted to access PIV with IV Team Nurse, unable to obtain IV line. PA was aware.

## 2012-07-21 NOTE — ED Provider Notes (Signed)
History     CSN: [id number]  Arrival date & time 07/21/12  4098   First MD Initiated Contact with Patient 07/21/12 2012      Chief Complaint  Patient presents with  . Cough   level V caveat applies secondary to mental retardation  HPI  History provided by the patient's grandmother. Patient is a 32 year old female with history of cerebral palsy, quadriplegia, mental retardation, hypertension and seizures who presents with symptoms of persistent productive cough. Patient is nonverbal. According to grandmother patient has had chronic problems of congestion and coughing for the past 3 months or more she has been seen multiple times by her primary care provider. She has been on antibiotic medications, antihistamines and Mucinex for her symptoms without significant improvement. Patient continues to cough up significant amounts of mucus. There has not been any acute changes in symptoms however grandmother is concerned that symptoms have continued and was instructed by the primary care office to come to the emergency room if symptoms are not improved. Patient has had some occasional fevers of 101 at home. Unknown if the patient has had a fever today. Patient did have some diarrhea while on antibiotics several weeks ago but no diarrhea symptoms today. No urinary changes or complaints. No other aggravating or alleviating factors.    Past Medical History  Diagnosis Date  . Cerebral palsy, quadriplegic   . Cortical blindness   . Seizures   . Mental retardation   . Cerebral palsy   . Hypertension 06/23/2010    Past Surgical History  Procedure Laterality Date  . Peg tube placement    . Peg placement  09/03/2011    Procedure: PERCUTANEOUS ENDOSCOPIC GASTROSTOMY (PEG) REPLACEMENT;  Surgeon: Beverley Fiedler, MD;  Location: [facility];  Service: Gastroenterology;  Laterality: N/A;  . Peg placement  02/19/2012    Procedure: PERCUTANEOUS ENDOSCOPIC GASTROSTOMY (PEG) REPLACEMENT;  Surgeon: Hart Carwin,  MD;  Location: [facility];  Service: Endoscopy;  Laterality: N/A;  needs gastrostomy button 24 Fr 4.4 and 3.5 available    Family History  Problem Relation Age of Onset  . Diabetes Mother   . Diabetes Maternal Grandmother     History  Substance Use Topics  . Smoking status: Never Smoker   . Smokeless tobacco: Never Used  . Alcohol Use: No    OB History   Grav Para Term Preterm Abortions TAB SAB Ect Mult Living                  Review of Systems  Unable to perform ROS   Allergies  Doxycycline  Home Medications   Current Outpatient Rx  Name  Route  Sig  Dispense  Refill  . Cetirizine HCl (CETIRIZINE HCL CHILDRENS ALRGY) 5 MG/5ML SYRP   Per Tube   Place 10 mLs (10 mg total) into feeding tube daily.   480 mL   11   . levothyroxine (SYNTHROID, LEVOTHROID) 88 MCG tablet   Oral   Take 88 mcg by mouth daily.         . Nutritional Supplements (JEVITY) LIQD   Oral   Take 1 Can by mouth 4 (four) times daily.         . phenytoin (DILANTIN) 125 MG/5ML suspension      4 ml by mouth via g-tube three times a day         . primidone (MYSOLINE) 250 MG tablet   Per J Tube   125 mg by Per J Tube route  2 (two) times daily.          . ranitidine (ZANTAC) 75 MG/5ML syrup   Per J Tube   5 mLs (75 mg total) by Per J Tube route 2 (two) times daily.   480 mL   5   . valproate (DEPAKENE) 250 MG/5ML syrup   Oral   Take 625 mg by mouth 4 (four) times daily. 13mL po q6hr           BP 133/89  Pulse 118  Temp(Src) 100.1 F (37.8 C) (Oral)  Resp 20  SpO2 94%  LMP 06/30/2012  Physical Exam  Nursing note and vitals reviewed. Constitutional: She appears well-developed and well-nourished. No distress.  HENT:  Head: Normocephalic.  Drooling from mouth. No oral or tongue lesions. No rhinorrhea. Some mucosal edema within nostrils.  Eyes: Conjunctivae are normal. Pupils are equal, round, and reactive to light.  Cardiovascular: Normal rate and regular rhythm.    Pulmonary/Chest: Effort normal and breath sounds normal.  Upper airway congestion  Abdominal: Soft.  Musculoskeletal:  Extremities appear at baseline.  Neurological: She is alert.  Patient is awake and responds with eyes to some voice commands. Neurologic function appears at baseline per grandmother. Pt non-verbal  Skin: Skin is warm and dry.  Psychiatric: She has a normal mood and affect. Her behavior is normal.    ED Course  Procedures   Results for orders placed during the hospital encounter of 07/21/12  CBC WITH DIFFERENTIAL      Result Value Range   WBC 8.4  4.0 - 10.5 K/uL   RBC 3.61 (*) 3.87 - 5.11 MIL/uL   Hemoglobin 11.2 (*) 12.0 - 15.0 g/dL   HCT 16.1 (*) 09.6 - 04.5 %   MCV 97.8  78.0 - 100.0 fL   MCH 31.0  26.0 - 34.0 pg   MCHC 31.7  30.0 - 36.0 g/dL   RDW 40.9 (*) 81.1 - 91.4 %   Platelets 137 (*) 150 - 400 K/uL   Neutrophils Relative 60  43 - 77 %   Neutro Abs 5.0  1.7 - 7.7 K/uL   Lymphocytes Relative 10 (*) 12 - 46 %   Lymphs Abs 0.8  0.7 - 4.0 K/uL   Monocytes Relative 29 (*) 3 - 12 %   Monocytes Absolute 2.4 (*) 0.1 - 1.0 K/uL   Eosinophils Relative 2  0 - 5 %   Eosinophils Absolute 0.2  0.0 - 0.7 K/uL   Basophils Relative 0  0 - 1 %   Basophils Absolute 0.0  0.0 - 0.1 K/uL  BASIC METABOLIC PANEL      Result Value Range   Sodium 130 (*) 135 - 145 mEq/L   Potassium 4.5  3.5 - 5.1 mEq/L   Chloride 92 (*) 96 - 112 mEq/L   CO2 29  19 - 32 mEq/L   Glucose, Bld 116 (*) 70 - 99 mg/dL   BUN 9  6 - 23 mg/dL   Creatinine, Ser 7.82 (*) 0.50 - 1.10 mg/dL   Calcium 9.1  8.4 - 95.6 mg/dL   GFR calc non Af Amer >90  >90 mL/min   GFR calc Af Amer >90  >90 mL/min      Dg Chest 2 View  07/21/2012  *RADIOLOGY REPORT*  Clinical Data: Cough and congestion.  CHEST - 2 VIEW  Comparison: 04/29/2012.  Findings: The cardiac silhouette, mediastinal and hilar contours are within normal limits and stable.  There are moderate bronchitic type lung  changes suggesting  bronchitis.  There is also patchy airspace opacities suspicious for developing infiltrates.  No pleural effusion.  The bony thorax is intact.  IMPRESSION: Bronchitis with superimposed patchy infiltrates.   Original Report Authenticated By: Rudie Meyer, M.D.      1. CAP (community acquired pneumonia)       MDM  8:30 PM patient seen and evaluated. Patient does not appear in any acute distress.   Patient discussed with attending physician. Will plan on admission given patient's other health conditions.  Spoke with family practice resident. They will except patient for transfer and admission.  Angus Seller, PA-C 07/22/12 [phone]

## 2012-07-21 NOTE — ED Notes (Signed)
Pt from home accompanied by grandmother/legal guardian with reports of cough that started 3 months ago. Pt's grandmother reports that pt has been treated for same at the [facility] with no improvement. Pt's grandmother reports productive cough that pt has a difficult time coughing up. Pt is nonverbal and wheelchair bound.

## 2012-07-22 DIAGNOSIS — H543 Unqualified visual loss, both eyes: Secondary | ICD-10-CM

## 2012-07-22 DIAGNOSIS — G809 Cerebral palsy, unspecified: Secondary | ICD-10-CM

## 2012-07-22 DIAGNOSIS — E039 Hypothyroidism, unspecified: Secondary | ICD-10-CM

## 2012-07-22 DIAGNOSIS — J329 Chronic sinusitis, unspecified: Secondary | ICD-10-CM

## 2012-07-22 DIAGNOSIS — R569 Unspecified convulsions: Secondary | ICD-10-CM

## 2012-07-22 DIAGNOSIS — J189 Pneumonia, unspecified organism: Secondary | ICD-10-CM

## 2012-07-22 DIAGNOSIS — A499 Bacterial infection, unspecified: Secondary | ICD-10-CM

## 2012-07-22 LAB — URINE MICROSCOPIC-ADD ON

## 2012-07-22 LAB — URINALYSIS, ROUTINE W REFLEX MICROSCOPIC
Glucose, UA: NEGATIVE mg/dL
Ketones, ur: NEGATIVE mg/dL
Nitrite: NEGATIVE
pH: 7.5 (ref 5.0–8.0)

## 2012-07-22 LAB — BASIC METABOLIC PANEL
BUN: 8 mg/dL (ref 6–23)
CO2: 29 mEq/L (ref 19–32)
Calcium: 8.7 mg/dL (ref 8.4–10.5)
Chloride: 97 mEq/L (ref 96–112)
Creatinine, Ser: 0.51 mg/dL (ref 0.50–1.10)
GFR calc Af Amer: >90 mL/min (ref >90)
GFR calc non Af Amer: >90 mL/min (ref >90)
Potassium: 4.1 mEq/L (ref 3.5–5.1)
Potassium: 3.9 mEq/L (ref 3.5–5.1)
Sodium: 135 mEq/L (ref 135–145)

## 2012-07-22 LAB — CBC
MCV: 97.1 fL (ref 78.0–100.0)
Platelets: 118 10*3/uL — ABNORMAL LOW (ref 150–400)
RBC: 3.45 MIL/uL — ABNORMAL LOW (ref 3.87–5.11)
WBC: 8.4 10*3/uL (ref 4.0–10.5)

## 2012-07-22 LAB — GLUCOSE, CAPILLARY: Glucose-Capillary: 183 mg/dL — ABNORMAL HIGH (ref 70–99)

## 2012-07-22 MED ORDER — BOOST PLUS PO LIQD
237.0000 mL | Freq: Four times a day (QID) | ORAL | Status: DC
  Filled 2012-07-22 (×6): qty 237

## 2012-07-22 MED ORDER — GUAIFENESIN ER 600 MG PO TB12: 600.0000 mg | ORAL_TABLET | Freq: Two times a day (BID) | ORAL | Status: DC

## 2012-07-22 MED ORDER — AZITHROMYCIN 200 MG/5ML PO SUSR: 250.0000 mg | Freq: Every day | ORAL | Status: DC

## 2012-07-22 MED ORDER — AZITHROMYCIN 200 MG/5ML PO SUSR
250.0000 mg | Freq: Every day | ORAL | Status: DC
  Filled 2012-07-22: qty 10

## 2012-07-22 MED ORDER — PHENYTOIN 125 MG/5ML PO SUSP
100.0000 mg | Freq: Three times a day (TID) | ORAL | Status: DC
  Filled 2012-07-22 (×3): qty 4

## 2012-07-22 MED ORDER — RANITIDINE HCL 150 MG/10ML PO SYRP
75.0000 mg | ORAL_SOLUTION | Freq: Two times a day (BID) | ORAL | Status: DC
  Administered 2012-07-22 – 2012-07-25 (×7): 75 mg via JEJUNOSTOMY
  Filled 2012-07-22 (×8): qty 10

## 2012-07-22 MED ORDER — DEXTROSE-NACL 5-0.9 % IV SOLN
INTRAVENOUS | Status: DC
  Administered 2012-07-22 – 2012-07-23 (×2): via INTRAVENOUS

## 2012-07-22 MED ORDER — DEXTROSE 50 % IV SOLN
INTRAVENOUS | Status: AC
  Filled 2012-07-22: qty 50

## 2012-07-22 MED ORDER — CLINDAMYCIN PALMITATE HCL 75 MG/5ML PO SOLR: 300.0000 mg | Freq: Four times a day (QID) | ORAL | Status: DC

## 2012-07-22 MED ORDER — GUAIFENESIN ER 600 MG PO TB12
600.0000 mg | ORAL_TABLET | Freq: Two times a day (BID) | ORAL | Status: DC
  Administered 2012-07-22 – 2012-07-25 (×7): 600 mg via ORAL
  Filled 2012-07-22 (×8): qty 1

## 2012-07-22 MED ORDER — DEXTROSE 50 % IV SOLN
50.0000 mL | Freq: Once | INTRAVENOUS | Status: AC | PRN
  Administered 2012-07-22: 50 mL via INTRAVENOUS

## 2012-07-22 MED ORDER — HEPARIN SODIUM (PORCINE) 5000 UNIT/ML IJ SOLN
5000.0000 [IU] | Freq: Three times a day (TID) | INTRAMUSCULAR | Status: DC
  Administered 2012-07-22 – 2012-07-25 (×11): 5000 [IU] via SUBCUTANEOUS
  Filled 2012-07-22 (×13): qty 1

## 2012-07-22 MED ORDER — ALBUTEROL SULFATE (5 MG/ML) 0.5% IN NEBU
2.5000 mg | INHALATION_SOLUTION | Freq: Four times a day (QID) | RESPIRATORY_TRACT | Status: DC
  Administered 2012-07-22 – 2012-07-25 (×12): 2.5 mg via RESPIRATORY_TRACT
  Filled 2012-07-22 (×12): qty 0.5

## 2012-07-22 MED ORDER — CLINDAMYCIN PHOSPHATE 300 MG/50ML IV SOLN
300.0000 mg | Freq: Four times a day (QID) | INTRAVENOUS | Status: DC
  Administered 2012-07-22 – 2012-07-25 (×13): 300 mg via INTRAVENOUS
  Filled 2012-07-22 (×14): qty 50

## 2012-07-22 MED ORDER — ACETAMINOPHEN 160 MG/5ML PO SOLN
650.0000 mg | Freq: Once | ORAL | Status: AC
  Administered 2012-07-22: 650 mg
  Filled 2012-07-22: qty 20.3

## 2012-07-22 MED ORDER — CETIRIZINE HCL 5 MG/5ML PO SYRP
10.0000 mg | ORAL_SOLUTION | Freq: Every day | ORAL | Status: DC
  Administered 2012-07-22 – 2012-07-25 (×4): 10 mg
  Filled 2012-07-22 (×4): qty 10

## 2012-07-22 MED ORDER — VALPROIC ACID 250 MG/5ML PO SYRP
625.0000 mg | ORAL_SOLUTION | Freq: Four times a day (QID) | ORAL | Status: DC
  Filled 2012-07-22 (×4): qty 15

## 2012-07-22 MED ORDER — SODIUM CHLORIDE 0.9 % IV SOLN
INTRAVENOUS | Status: DC
  Administered 2012-07-22: 04:00:00 via INTRAVENOUS

## 2012-07-22 MED ORDER — ACETAMINOPHEN 325 MG PO TABS: 650.0000 mg | ORAL_TABLET | Freq: Four times a day (QID) | ORAL | Status: DC | PRN

## 2012-07-22 MED ORDER — PHENYTOIN SODIUM 50 MG/ML IJ SOLN
100.0000 mg | Freq: Three times a day (TID) | INTRAMUSCULAR | Status: DC
  Administered 2012-07-22 – 2012-07-25 (×10): 100 mg via INTRAVENOUS
  Filled 2012-07-22 (×13): qty 2

## 2012-07-22 MED ORDER — DEXTROSE 5 % IV SOLN
250.0000 mg | INTRAVENOUS | Status: DC
  Administered 2012-07-22 – 2012-07-24 (×3): 250 mg via INTRAVENOUS
  Filled 2012-07-22 (×4): qty 250

## 2012-07-22 MED ORDER — VALPROATE SODIUM 500 MG/5ML IV SOLN
650.0000 mg | Freq: Four times a day (QID) | INTRAVENOUS | Status: DC
  Administered 2012-07-22 – 2012-07-25 (×11): 650 mg via INTRAVENOUS
  Filled 2012-07-22 (×16): qty 6.5

## 2012-07-22 MED ORDER — PSEUDOEPHEDRINE HCL 30 MG/5ML PO SYRP: 30.0000 mg | ORAL_SOLUTION | Freq: Four times a day (QID) | ORAL | Status: DC | PRN

## 2012-07-22 MED ORDER — PRIMIDONE 250 MG PO TABS
125.0000 mg | ORAL_TABLET | Freq: Two times a day (BID) | ORAL | Status: DC
  Administered 2012-07-22 – 2012-07-23 (×3): 125 mg via JEJUNOSTOMY
  Administered 2012-07-24: 22:00:00 via JEJUNOSTOMY
  Administered 2012-07-24 – 2012-07-25 (×3): 125 mg via JEJUNOSTOMY
  Filled 2012-07-22 (×9): qty 1

## 2012-07-22 MED ORDER — ACETAMINOPHEN 650 MG RE SUPP
650.0000 mg | Freq: Four times a day (QID) | RECTAL | Status: DC | PRN
  Administered 2012-07-23 (×2): 650 mg via RECTAL
  Filled 2012-07-22 (×4): qty 1

## 2012-07-22 MED ORDER — IPRATROPIUM BROMIDE 0.02 % IN SOLN
0.5000 mg | Freq: Four times a day (QID) | RESPIRATORY_TRACT | Status: DC
  Administered 2012-07-22 – 2012-07-25 (×12): 0.5 mg via RESPIRATORY_TRACT
  Filled 2012-07-22 (×13): qty 2.5

## 2012-07-22 MED ORDER — PRO-STAT SUGAR FREE PO LIQD
30.0000 mL | Freq: Every day | ORAL | Status: DC
  Filled 2012-07-22: qty 30

## 2012-07-22 MED ORDER — JEVITY 1.2 CAL PO LIQD
237.0000 mL | Freq: Four times a day (QID) | ORAL | Status: DC
  Administered 2012-07-22 – 2012-07-25 (×12): 237 mL
  Filled 2012-07-22 (×17): qty 237

## 2012-07-22 MED ORDER — LEVOTHYROXINE SODIUM 88 MCG PO TABS
88.0000 ug | ORAL_TABLET | Freq: Every day | ORAL | Status: DC
  Administered 2012-07-22 – 2012-07-25 (×4): 88 ug via ORAL
  Filled 2012-07-22 (×5): qty 1

## 2012-07-22 NOTE — Progress Notes (Signed)
INITIAL NUTRITION ASSESSMENT  DOCUMENTATION CODES Per approved criteria  -Not Applicable   INTERVENTION:  1 can Jevity 1.2 via PEG QID.   Free water flushes per MD  NUTRITION DIAGNOSIS: Inadequate oral intake related to inability to eat as evidenced by NPO status.   Goal: TF regimen to meet >/=90% estimated nutrition needs  Monitor:  TF initiation and tolerance, weight trends, I/O's  Reason for Assessment: Consult: PEG tube feeds  32 y.o. female  Admitting Dx: Cough  ASSESSMENT: Pt admitted with c/op cough x3 months. Pt had been treated for cough at [facility] with no improvement. Pt with CAP.  Pt is nonverbal and is wheelchair bound. Pt with a history of cerebral palsy, quadriplegia, mental retardation, hypertension and seizures.  Pt with PEG at baseline. Family reports that pt's home regimen is 1 can Jevity 1.2 QID. Will continue this regimen here. This TF regimen provides 1140 kcal, 53 grams of protein, and 764 mL free water. This TF regimen provides 100% estimated kcal needs and 100% estimated protein needs. Free water flushes per MD.  Per weight hx, pt has been gaining weight on this regimen.   Height: 4'11" (1.499 m), per family report  Weight: Wt Readings from Last 1 Encounters:  07/22/12 102 lb 15.3 oz (46.7 kg)    Ideal Body Weight: 98.5 lbs  % Ideal Body Weight: 104.5%  Wt Readings from Last 10 Encounters:  07/22/12 102 lb 15.3 oz (46.7 kg)  04/07/12 98 lb (44.453 kg)  06/11/10 95 lb (43.092 kg)    Usual Body Weight: 98-100 lbs, per family report  % Usual Body Weight: 102-104%  BMI:  20.7 kg/(m^2), WNL  Estimated Nutritional Needs: Kcal: 1100-1300 Protein: 40-50 grams Fluid: per team  Skin: intact, no wounds noted  Diet Order: NPO  EDUCATION NEEDS: -No education needs identified at this time   Intake/Output Summary (Last 24 hours) at 07/22/12 1007 Last data filed at 07/22/12 0700  Gross per 24 hour  Intake 222.42 ml   Output      0 ml  Net 222.42 ml    Last BM: PTA   Labs:   Recent Labs Lab 07/21/12 2143 07/22/12 0657  NA 130* 135  K 4.5 4.1  CL 92* 97  CO2 29 29  BUN 9 7  CREATININE 0.49* 0.57  CALCIUM 9.1 8.7  GLUCOSE 116* 27*    CBG (last 3)   Recent Labs  07/22/12 0941  GLUCAP 250*    Scheduled Meds: . albuterol  2.5 mg Nebulization Q6H  . azithromycin  250 mg Intravenous Q24H  . cetirizine HCl  10 mg Per Tube Daily  . clindamycin (CLEOCIN) IV  300 mg Intravenous Q6H  . dextrose      . guaiFENesin  600 mg Oral BID  . heparin  5,000 Units Subcutaneous Q8H  . ipratropium  0.5 mg Nebulization Q6H  . lactose free nutrition  237 mL Oral QID  . levothyroxine  88 mcg Oral QAC breakfast  . phenytoin (DILANTIN) IV  100 mg Intravenous Q8H  . primidone  125 mg Per J Tube BID  . ranitidine  75 mg Per J Tube BID  . valproate sodium  650 mg Intravenous Q6H    Continuous Infusions: . dextrose 5 % and 0.9% NaCl 85 mL/hr at 07/22/12 2841    Past Medical History  Diagnosis Date  . Cerebral palsy, quadriplegic   . Cortical blindness   . Seizures   . Mental retardation   .  Cerebral palsy   . Hypertension 06/23/2010    Past Surgical History  Procedure Laterality Date  . Peg tube placement    . Peg placement  09/03/2011    Procedure: PERCUTANEOUS ENDOSCOPIC GASTROSTOMY (PEG) REPLACEMENT;  Surgeon: Beverley Fiedler, MD;  Location: [facility];  Service: Gastroenterology;  Laterality: N/A;  . Peg placement  02/19/2012    Procedure: PERCUTANEOUS ENDOSCOPIC GASTROSTOMY (PEG) REPLACEMENT;  Surgeon: Hart Carwin, MD;  Location: [facility];  Service: Endoscopy;  Laterality: N/A;  needs gastrostomy button 24 Fr 4.4 and 3.5 available     Trenton Gammon Dietetic Intern # [phone]   Agree with Dietetic Intern Note. Changes in [facility] [address], [place] Pager [phone] After Hours pager [phone]

## 2012-07-22 NOTE — Progress Notes (Signed)
FMTS Attending Admission Note: Waneta Fitting,MD I  have seen and examined this patient, reviewed their chart. I have discussed this patient with the resident. I agree with the resident's findings, assessment and care plan.  Briefly: Megan [name] was seen and examined by me this morning,no family member was with her this morning hence could not obtain information,residents note was however reviewed. Patient was in stable condition receiving chest PT.She had an episode of hypoglycemia and D50 was given,plan to resume feed as soon as her peg tube is fixed for feeding.,Continue A/B treatment for pneumonia,monitor vital sign.

## 2012-07-22 NOTE — Progress Notes (Signed)
Family Medicine Teaching Service Daily Progress Note Service Page: [phone]  Subjective: appears to be improved from previous exam, less secretions.   Objective: Temp:  [99.4 F (37.4 C)-101.6 F (38.7 C)] 99.4 F (37.4 C) (03/28 0300) Pulse Rate:  [98-120] 101 (03/28 0532) Resp:  [18-20] 18 (03/28 0532) BP: (106-133)/(73-89) 106/73 mmHg (03/28 0532) SpO2:  [94 %-100 %] 100 % (03/28 0905) Weight:  [102 lb 15.3 oz (46.7 kg)] 102 lb 15.3 oz (46.7 kg) (03/28 0300) Exam: General: NAD, resting comfortably in bed Cardiovascular: rrr, no mrg appreciated Respiratory: CTAB with referred upper airway signs Abdomen: s, NT, ND, PEG tube in place Extremities: no edema   Results for orders placed during the hospital encounter of 07/21/12 (from the past 24 hour(s))  CBC WITH DIFFERENTIAL     Status: Abnormal   Collection Time    07/21/12  9:43 PM      Result Value Range   WBC 8.4  4.0 - 10.5 K/uL   RBC 3.61 (*) 3.87 - 5.11 MIL/uL   Hemoglobin 11.2 (*) 12.0 - 15.0 g/dL   HCT 45.4 (*) 09.8 - 11.9 %   MCV 97.8  78.0 - 100.0 fL   MCH 31.0  26.0 - 34.0 pg   MCHC 31.7  30.0 - 36.0 g/dL   RDW 14.7 (*) 82.9 - 56.2 %   Platelets 137 (*) 150 - 400 K/uL   Neutrophils Relative 60  43 - 77 %   Neutro Abs 5.0  1.7 - 7.7 K/uL   Lymphocytes Relative 10 (*) 12 - 46 %   Lymphs Abs 0.8  0.7 - 4.0 K/uL   Monocytes Relative 29 (*) 3 - 12 %   Monocytes Absolute 2.4 (*) 0.1 - 1.0 K/uL   Eosinophils Relative 2  0 - 5 %   Eosinophils Absolute 0.2  0.0 - 0.7 K/uL   Basophils Relative 0  0 - 1 %   Basophils Absolute 0.0  0.0 - 0.1 K/uL  BASIC METABOLIC PANEL     Status: Abnormal   Collection Time    07/21/12  9:43 PM      Result Value Range   Sodium 130 (*) 135 - 145 mEq/L   Potassium 4.5  3.5 - 5.1 mEq/L   Chloride 92 (*) 96 - 112 mEq/L   CO2 29  19 - 32 mEq/L   Glucose, Bld 116 (*) 70 - 99 mg/dL   BUN 9  6 - 23 mg/dL   Creatinine, Ser 1.30 (*) 0.50 - 1.10 mg/dL   Calcium 9.1  8.4 - 86.5 mg/dL    GFR calc non Af Amer >90  >90 mL/min   GFR calc Af Amer >90  >90 mL/min  BASIC METABOLIC PANEL     Status: Abnormal   Collection Time    07/22/12  6:57 AM      Result Value Range   Sodium 135  135 - 145 mEq/L   Potassium 4.1  3.5 - 5.1 mEq/L   Chloride 97  96 - 112 mEq/L   CO2 29  19 - 32 mEq/L   Glucose, Bld 27 (*) 70 - 99 mg/dL   BUN 7  6 - 23 mg/dL   Creatinine, Ser 7.84  0.50 - 1.10 mg/dL   Calcium 8.7  8.4 - 69.6 mg/dL   GFR calc non Af Amer >90  >90 mL/min   GFR calc Af Amer >90  >90 mL/min  CBC     Status: Abnormal   Collection  Time    07/22/12  6:57 AM      Result Value Range   WBC 8.4  4.0 - 10.5 K/uL   RBC 3.45 (*) 3.87 - 5.11 MIL/uL   Hemoglobin 10.7 (*) 12.0 - 15.0 g/dL   HCT 96.0 (*) 45.4 - 09.8 %   MCV 97.1  78.0 - 100.0 fL   MCH 31.0  26.0 - 34.0 pg   MCHC 31.9  30.0 - 36.0 g/dL   RDW 11.9 (*) 14.7 - 82.9 %   Platelets 118 (*) 150 - 400 K/uL  URINALYSIS, ROUTINE W REFLEX MICROSCOPIC     Status: Abnormal   Collection Time    07/22/12  7:42 AM      Result Value Range   Color, Urine YELLOW  YELLOW   APPearance CLEAR  CLEAR   Specific Gravity, Urine <1.005 (*) 1.005 - 1.030   pH 7.5  5.0 - 8.0   Glucose, UA NEGATIVE  NEGATIVE mg/dL   Hgb urine dipstick TRACE (*) NEGATIVE   Bilirubin Urine NEGATIVE  NEGATIVE   Ketones, ur NEGATIVE  NEGATIVE mg/dL   Protein, ur NEGATIVE  NEGATIVE mg/dL   Urobilinogen, UA 0.2  0.0 - 1.0 mg/dL   Nitrite NEGATIVE  NEGATIVE   Leukocytes, UA TRACE (*) NEGATIVE  URINE MICROSCOPIC-ADD ON     Status: None   Collection Time    07/22/12  7:42 AM      Result Value Range   Squamous Epithelial / LPF RARE  RARE   WBC, UA 0-2  <3 WBC/hpf   RBC / HPF 0-2  <3 RBC/hpf   Bacteria, UA RARE  RARE   Urine-Other LESS THAN 10 mL OF URINE SUBMITTED      Imaging/Diagnostic Tests: Dg Chest 2 View  07/21/2012 IMPRESSION: Bronchitis with superimposed patchy infiltrates.   Original Report Authenticated By: Rudie Meyer, M.D.     Assessment/Plan: Megan [name] is a 32 y.o. year old female with a history of MRCP, hypothyroidism, seizures presenting with several month history of cough and nasal congestion.   1. Pneumonia: patient with several month history of cough and congestion now with patchy infiltrates on CXR concerning for pneumonia. With patient difficulty in dealing with ariway secretions there is concern for aspiration PNA in addition to CAP. Notably with no leukocytosis.   -will start on mucinex and pseudoephedrine for secretions  -s/p azithro and CTX in ED  -will continue on clinda and azithro to give coverage for aspiration and CAP-plan for transition to PO once appropriate tube feeding equipment is obtained -tylenol prn for fever  -will c/s RT for recs on suctioning airway  -continue home zyrtec  -follow WBC and fever curve   2. Seizures: will continue current home medications, depakene, primidone, dilantin-will convert to    3. Hyponatremia: resolved  4. Hypoglycemia: patient with low blood sugar this morning likely related to not getting tube feeds since admission given difficulty in locating proper equipment for PEG tube use -will start on D5 in fluids and given amp of D50 -nursing to locate appropriate equipment for PEG tube to get patient adequate feeds   FEN/GI: Boost plus feeds through G-tube, D5 NS @ 85 mL/hr, will obtain nutrition consult  Prophylaxis: SQ heparin, zantac  Disposition: discharge pending improvement in symptoms  Code: will need to discuss once family is present   Marikay Alar, MD 07/22/2012, 9:15 AM

## 2012-07-22 NOTE — Progress Notes (Signed)
Utilization Review Completed.   Tonye Tancredi, RN, BSN Nurse Case Manager  [phone]  

## 2012-07-22 NOTE — Progress Notes (Signed)
CRITICAL VALUE ALERT  Critical value received:  Glucose 27  Date of notification:  07/22/12  Time of notification:  0900  Critical value read back:yes  Nurse who received alert:  Mauro Kaufmann, RN  MD notified (1st page):  Family Medicine  Time of first page:  0905  MD notified (2nd page):  Time of second page:  Responding MD:  Family Medicine  Time MD responded:  [phone]

## 2012-07-22 NOTE — Discharge Summary (Signed)
Physician Discharge Summary  Patient ID: Megan [name] MRN: [id number] DOB: 1980-10-17 Age: 32 y.o.  Admit date: 07/21/2012 Discharge date: 07/25/2012 Admitting Physician: Tobin Chad, MD  PCP: Demetria Pore, MD  Consultants: none   Discharge Diagnosis: Principal Problem:   Pneumonia Active Problems:   CEREBRAL PALSY   Aspiration of stomach contents   Hypertension   Hypothyroidism   Productive cough   Bacterial sinusitis  Hospital Course Megan [name] is a 32 y.o. year old female with a history of MRCP, hypothyroidism, seizures presenting with several month history of cough and nasal congestion.   1. Pneumonia: patient with several month history of cough and congestion now with patchy infiltrates on CXR concerning for pneumonia. With patient difficulty in dealing with airway secretions there was concern for aspiration PNA in addition to CAP. Notably with no leukocytosis. Patient started on IV azithromycin and clindamycin. Completed 5 day course of azithro prior to discharge. Transitioned to PO clindamycin once afebrile for >24 hours to complete 7 day course for aspiration. Additionally started on robinul, mucinex, and pseudoephedrine for secretions and congestion which were markedly improved at time of discharge. Respratory therapy consulted for assistance in managing copious airway secretions. Noted that patient had minimal blood streaking in mucus production following deep suctioning on day of discharge. This was likely related to irritation related to suctioning and drying up of secretions with robinul.  2. Hypoxia: patient developed new oxygen requirement while in the hospital and was placed on 1-2 L [place] to maintain O2 levels. This is likely related to patients pneumonia and increased secretions. She was weaned off of this and was stable on room air at time of discharge.  3. Seizures: continued current home medications depakene, primidone, dilantin. Initially patient was started  on depakote and dilantin IV as did not have appropriate equipment for tube feeds at time of admission. These were transitioned back to per tube prior to discharge.   4. Hypoglycemia: CBG of 27 on 3/28 AM; likely due to inability to use PEG tube at that time. Patient was given an amp of D50 and started on D5 in her fluids until appropriate equipment was obtained to use PEG tube. CBGs were stable once receiving tube feeds.  5. Tube feeds: initially were not able to continue these as there was not appropriate equipment to use PEG tube upon arrival to the hospital. Tube feeds restarted once patients grandmother brought in equipment. On day of discharge patient was noted to have residual feeds. I spoke with patients family member who stated this was a normal occurrence for the patient if she gets fed too frequently or too large of a volume.  Problem List 1. Pneumonia 2. Hypoxia 3. Seizures 4. Hypoglycemia 5. MRCP 6. PEG tube         Discharge PE   Filed Vitals:   07/22/12 0532  BP: 106/73  Pulse: 101  Temp:   Resp: 18   General: NAD, resting comfortably in bed  Oropharynx: tongue is dry; no evidence of secretions from nose or mouth  Cardiovascular: RRR, no murmur  Respiratory: Transmitted upper airway sounds; good air entry; no increased WOB  Abdomen: soft, NT, ND, +BS  Extremities: no edema  Procedures/Imaging:  Dg Chest 2 View  07/21/2012  *RADIOLOGY REPORT*  Clinical Data: Cough and congestion.  CHEST - 2 VIEW  Comparison: 04/29/2012.  Findings: The cardiac silhouette, mediastinal and hilar contours are within normal limits and stable.  There are moderate bronchitic type lung changes suggesting  bronchitis.  There is also patchy airspace opacities suspicious for developing infiltrates.  No pleural effusion.  The bony thorax is intact.  IMPRESSION: Bronchitis with superimposed patchy infiltrates.   Original Report Authenticated By: Rudie Meyer, M.D.     Labs  CBC  Recent Labs Lab  07/21/12 2143 07/22/12 0657  WBC 8.4 8.4  HGB 11.2* 10.7*  HCT 35.3* 33.5*  PLT 137* 118*   BMET  Recent Labs Lab 07/21/12 2143 07/22/12 0657  NA 130* 135  K 4.5 4.1  CL 92* 97  CO2 29 29  BUN 9 7  CREATININE 0.49* 0.57  CALCIUM 9.1 8.7  GLUCOSE 116* 27*   Results for orders placed during the hospital encounter of 07/21/12 (from the past 72 hour(s))  CBC WITH DIFFERENTIAL     Status: Abnormal   Collection Time    07/21/12  9:43 PM      Result Value Range   WBC 8.4  4.0 - 10.5 K/uL   RBC 3.61 (*) 3.87 - 5.11 MIL/uL   Hemoglobin 11.2 (*) 12.0 - 15.0 g/dL   HCT 86.5 (*) 78.4 - 69.6 %   MCV 97.8  78.0 - 100.0 fL   MCH 31.0  26.0 - 34.0 pg   MCHC 31.7  30.0 - 36.0 g/dL   RDW 29.5 (*) 28.4 - 13.2 %   Platelets 137 (*) 150 - 400 K/uL   Neutrophils Relative 60  43 - 77 %   Neutro Abs 5.0  1.7 - 7.7 K/uL   Lymphocytes Relative 10 (*) 12 - 46 %   Lymphs Abs 0.8  0.7 - 4.0 K/uL   Monocytes Relative 29 (*) 3 - 12 %   Monocytes Absolute 2.4 (*) 0.1 - 1.0 K/uL   Eosinophils Relative 2  0 - 5 %   Eosinophils Absolute 0.2  0.0 - 0.7 K/uL   Basophils Relative 0  0 - 1 %   Basophils Absolute 0.0  0.0 - 0.1 K/uL  BASIC METABOLIC PANEL     Status: Abnormal   Collection Time    07/21/12  9:43 PM      Result Value Range   Sodium 130 (*) 135 - 145 mEq/L   Potassium 4.5  3.5 - 5.1 mEq/L   Chloride 92 (*) 96 - 112 mEq/L   CO2 29  19 - 32 mEq/L   Glucose, Bld 116 (*) 70 - 99 mg/dL   BUN 9  6 - 23 mg/dL   Creatinine, Ser 4.40 (*) 0.50 - 1.10 mg/dL   Calcium 9.1  8.4 - 10.2 mg/dL   GFR calc non Af Amer >90  >90 mL/min   GFR calc Af Amer >90  >90 mL/min   Comment:            The eGFR has been calculated     using the CKD EPI equation.     This calculation has not been     validated in all clinical     situations.     eGFR's persistently     <90 mL/min signify     possible Chronic Kidney Disease.  BASIC METABOLIC PANEL     Status: Abnormal   Collection Time    07/22/12  6:57  AM      Result Value Range   Sodium 135  135 - 145 mEq/L   Potassium 4.1  3.5 - 5.1 mEq/L   Chloride 97  96 - 112 mEq/L   CO2 29  19 - 32  mEq/L   Glucose, Bld 27 (*) 70 - 99 mg/dL   Comment: REPEATED TO VERIFY     QUESTIONABLE RESULTS, RECOMMEND RECOLLECT TO VERIFY     CRITICAL RESULT CALLED TO, READ BACK BY AND VERIFIED WITH:     ANDRIANNE JACOBS,RN AT 1610 07/22/12 BY ZPERRY.   BUN 7  6 - 23 mg/dL   Creatinine, Ser 9.60  0.50 - 1.10 mg/dL   Calcium 8.7  8.4 - 45.4 mg/dL   GFR calc non Af Amer >90  >90 mL/min   GFR calc Af Amer >90  >90 mL/min   Comment:            The eGFR has been calculated     using the CKD EPI equation.     This calculation has not been     validated in all clinical     situations.     eGFR's persistently     <90 mL/min signify     possible Chronic Kidney Disease.  CBC     Status: Abnormal   Collection Time    07/22/12  6:57 AM      Result Value Range   WBC 8.4  4.0 - 10.5 K/uL   RBC 3.45 (*) 3.87 - 5.11 MIL/uL   Hemoglobin 10.7 (*) 12.0 - 15.0 g/dL   HCT 09.8 (*) 11.9 - 14.7 %   MCV 97.1  78.0 - 100.0 fL   MCH 31.0  26.0 - 34.0 pg   MCHC 31.9  30.0 - 36.0 g/dL   RDW 82.9 (*) 56.2 - 13.0 %   Platelets 118 (*) 150 - 400 K/uL   Comment: PLATELET COUNT CONFIRMED BY SMEAR  URINALYSIS, ROUTINE W REFLEX MICROSCOPIC     Status: Abnormal   Collection Time    07/22/12  7:42 AM      Result Value Range   Color, Urine YELLOW  YELLOW   APPearance CLEAR  CLEAR   Specific Gravity, Urine <1.005 (*) 1.005 - 1.030   pH 7.5  5.0 - 8.0   Glucose, UA NEGATIVE  NEGATIVE mg/dL   Hgb urine dipstick TRACE (*) NEGATIVE   Bilirubin Urine NEGATIVE  NEGATIVE   Ketones, ur NEGATIVE  NEGATIVE mg/dL   Protein, ur NEGATIVE  NEGATIVE mg/dL   Urobilinogen, UA 0.2  0.0 - 1.0 mg/dL   Nitrite NEGATIVE  NEGATIVE   Leukocytes, UA TRACE (*) NEGATIVE  URINE MICROSCOPIC-ADD ON     Status: None   Collection Time    07/22/12  7:42 AM      Result Value Range   Squamous Epithelial /  LPF RARE  RARE   WBC, UA 0-2  <3 WBC/hpf   RBC / HPF 0-2  <3 RBC/hpf   Bacteria, UA RARE  RARE   Urine-Other LESS THAN 10 mL OF URINE SUBMITTED     Comment: MICROSCOPIC EXAM PERFORMED ON UNCONCENTRATED URINE  GLUCOSE, CAPILLARY     Status: Abnormal   Collection Time    07/22/12  9:41 AM      Result Value Range   Glucose-Capillary 250 (*) 70 - 99 mg/dL       Patient condition at time of discharge/disposition: stable  Disposition-home   Follow up issues: 1. Consider restarting robinul in outpatient setting if patient continues to have increased secretions 2. F/u on residual volumes following tube feeds-family states this is normal  Discharge follow up:  Follow-up Information   Follow up with [facility], MD On 07/28/2012. (2 pm)  Contact information:   [address] [place] [address] [phone]       Future Appointments Provider Department Dept Phone   07/28/2012 2:00 PM Tito Dine, MD [facility] [phone]       Discharge Instructions: Please refer to Patient Instructions section of EMR for full details.  Patient was counseled important signs and symptoms that should prompt return to medical care, changes in medications, dietary instructions, activity restrictions, and follow up appointments.   Discharge Medications   Medication List    TAKE these medications       cetirizine HCl 5 MG/5ML Syrp  Commonly known as:  CETIRIZINE HCL CHILDRENS ALRGY  Place 10 mLs (10 mg total) into feeding tube daily.     clindamycin 75 MG/5ML solution  Commonly known as:  CLEOCIN  Take 20 mLs (300 mg total) by mouth every 6 (six) hours.     DEPAKENE 250 MG/5ML syrup  Generic drug:  valproate  Take 625 mg by mouth 4 (four) times daily. 13mL po q6hr     DILANTIN 125 MG/5ML suspension  Generic drug:  phenytoin  4 ml by mouth via g-tube three times a day     guaiFENesin 600 MG 12 hr tablet  Commonly known as:  MUCINEX  Take 1  tablet (600 mg total) by mouth 2 (two) times daily.     Jevity Liqd  Take 1 Can by mouth 4 (four) times daily.     levothyroxine 88 MCG tablet  Commonly known as:  SYNTHROID, LEVOTHROID  Take 88 mcg by mouth daily.     primidone 250 MG tablet  Commonly known as:  MYSOLINE  125 mg by Per J Tube route 2 (two) times daily.     ranitidine 75 MG/5ML syrup  Commonly known as:  ZANTAC  5 mLs (75 mg total) by Per J Tube route 2 (two) times daily.       Marikay Alar, MD of [facility] 07/26/2012 9:04 AM

## 2012-07-22 NOTE — H&P (Signed)
I examined this patient and discussed the care plan with Dr Margot Ables and the [facility] team and agree with assessment and plan as documented in the admission note above. No family member is currently present. She is lying on her back, small for age but appears well nourished, quadriparetic with contractures, no verbal response, rhythmic tongue protrusion, marked nasal congestion so primarily mouth breathing. Her chest xray suggests early alveolar infiltrates on the right, but the WBC count is normal. Consider sinus CT's since the nasal congestion has been prolonged.

## 2012-07-22 NOTE — Progress Notes (Signed)
Hypoglycemic Event  CBG: 27  Treatment: D50 IV 50 mL  Symptoms: None  Follow-up CBG: Time: 0915 CBG Result:253  Possible Reasons for Event: Inadequate meal intake  Comments/MD notified:Family Medicine    Megan [name]  Remember to initiate Hypoglycemia Order Set & complete

## 2012-07-22 NOTE — H&P (Signed)
Family Medicine Teaching [facility] Admission History and Physical Service Pager: [phone]  Patient name: Megan [name] Medical record number: [id number] Date of birth: [birth year] Age: 32 y.o. Gender: female  Primary Care Provider: Demetria Pore, MD  Chief Complaint: nasal secretions and second opinion  Assessment and Plan: Megan [name] is a 32 y.o. year old female with a history of MRCP, hypothyroidism, seizures presenting with several month history of cough and nasal congestion.   1. Pneumonia: patient with several month history of cough and congestion now with patchy infiltrates on CXR concerning for pneumonia. With patient difficulty in dealing with ariway secretions there is concern for aspiration PNA in addition to CAP. Notably with no leukocytosis. -will admit to med-surg, attending Dr. Sheffield Slider -will start on mucinex and pseudoephedrine for secretions -s/p azithro and CTX in ED -will start on clinda and azithro to give coverage for aspiration and CAP -tylenol prn for fever -will c/s RT for recs on suctioning airway -continue home zyrtec -follow WBC and fever curve  2. Seizures: will continue current home medications, depakene, primidone, dilantin  3. Hyponatremia: patient with Na of 130 on admission. Likely from diarrhea.   -will continue to follow on BMET -volume repletion with NS bolus in ED -will start on MIVF NS  FEN/GI: Boost plus feeds through G-tube, NS @ 85 mL/hr, will obtain nutrition consult Prophylaxis: SQ heparin, zantac Disposition: admit to med-surg, discharge pending improvement in symptoms Code: will need to discuss once family is present  History of Present Illness: Megan [name] is a 32 y.o. year old female with a history of MRCP, hypothyroidism, seizures presenting with several month history of cough and nasal congestion. History obtained mostly from patients chart and through phone conversation Dr. Konrad Dolores had with patient family member as no family  members were present upon evaluation and patient is non-verbal.  Patient presented to the [facility] ED for concerns of increased nasal secretions and fever. Notably the patient has been seen for this issue in our clinic multiple times since December 2013. Has been treated with OTC mucinex, robinol for increased secretion, augmentin 7 day course x2, and zyrtec. Notably had a CXR in early January that did not reveal any infiltrates Day prior to admission patient noted to have fever to 69 F (has been occurring off and on for some time) and worsening airway secretions.   In the ED she was noted to be febrile to 101.6. CXR was obtained that revealed bronchitic changes and superimposed patchy infiltrates. She was additionally noted to have copious nasal secretions.  Patient Active Problem List  Diagnosis  . CEREBRAL PALSY  . BLINDNESS, BILATERAL  . Seizure  . Aspiration of stomach contents  . Hypertension  . Hypothyroidism  . Pneumonia  . Well adult exam  . Feeding difficulties and mismanagement  . Viral URI  . Productive cough  . Bacterial sinusitis   Past Medical History: Past Medical History  Diagnosis Date  . Cerebral palsy, quadriplegic   . Cortical blindness   . Seizures   . Mental retardation   . Cerebral palsy   . Hypertension 06/23/2010   Past Surgical History: Past Surgical History  Procedure Laterality Date  . Peg tube placement    . Peg placement  09/03/2011    Procedure: PERCUTANEOUS ENDOSCOPIC GASTROSTOMY (PEG) REPLACEMENT;  Surgeon: Beverley Fiedler, MD;  Location: [facility];  Service: Gastroenterology;  Laterality: N/A;  . Peg placement  02/19/2012    Procedure: PERCUTANEOUS ENDOSCOPIC GASTROSTOMY (PEG) REPLACEMENT;  Surgeon:  Hart Carwin, MD;  Location: [facility];  Service: Endoscopy;  Laterality: N/A;  needs gastrostomy button 24 Fr 4.4 and 3.5 available   Social History: History  Substance Use Topics  . Smoking status: Never Smoker   . Smokeless tobacco: Never Used   . Alcohol Use: No   For any additional social history documentation, please refer to relevant sections of EMR.  Family History: Family History  Problem Relation Age of Onset  . Diabetes Mother   . Diabetes Maternal Grandmother    Allergies: Allergies  Allergen Reactions  . Doxycycline     REACTION: Blisters / swelling   No current facility-administered medications on file prior to encounter.   Current Outpatient Prescriptions on File Prior to Encounter  Medication Sig Dispense Refill  . Cetirizine HCl (CETIRIZINE HCL CHILDRENS ALRGY) 5 MG/5ML SYRP Place 10 mLs (10 mg total) into feeding tube daily.  480 mL  11  . phenytoin (DILANTIN) 125 MG/5ML suspension 4 ml by mouth via g-tube three times a day      . primidone (MYSOLINE) 250 MG tablet 125 mg by Per J Tube route 2 (two) times daily.       . ranitidine (ZANTAC) 75 MG/5ML syrup 5 mLs (75 mg total) by Per J Tube route 2 (two) times daily.  480 mL  5  . valproate (DEPAKENE) 250 MG/5ML syrup Take 625 mg by mouth 4 (four) times daily. 13mL po q6hr       Review Of Systems: Per HPI with the following additions: none Otherwise 12 point review of systems was performed and was unremarkable.  Physical Exam: BP 127/78  Pulse 98  Temp(Src) 101.6 F (38.7 C) (Rectal)  Resp 18  SpO2 100%  LMP 06/30/2012 Exam: General: non-verbal, laying in bed with copious nasal secretions HEENT: tongue extruding from mouth, copious nasal secretions, mouth appears minimally dry Cardiovascular: rrr, no mrg appreciated, 2+ DP pulses Respiratory: coarse breath sounds heard more prominently in right lung field, decreased air movement Abdomen: soft, apparently non-tender, PEG tube in place with no erythema or swelling Extremities: no edema MSK: Contraction of upper and lower extremities Skin: no lesions, warm, intact Neuro: non-verbal, would open eyes to voice  Labs and Imaging:  Results for orders placed during the hospital encounter of 07/21/12  (from the past 24 hour(s))  CBC WITH DIFFERENTIAL     Status: Abnormal   Collection Time    07/21/12  9:43 PM      Result Value Range   WBC 8.4  4.0 - 10.5 K/uL   RBC 3.61 (*) 3.87 - 5.11 MIL/uL   Hemoglobin 11.2 (*) 12.0 - 15.0 g/dL   HCT 40.9 (*) 81.1 - 91.4 %   MCV 97.8  78.0 - 100.0 fL   MCH 31.0  26.0 - 34.0 pg   MCHC 31.7  30.0 - 36.0 g/dL   RDW 78.2 (*) 95.6 - 21.3 %   Platelets 137 (*) 150 - 400 K/uL   Neutrophils Relative 60  43 - 77 %   Neutro Abs 5.0  1.7 - 7.7 K/uL   Lymphocytes Relative 10 (*) 12 - 46 %   Lymphs Abs 0.8  0.7 - 4.0 K/uL   Monocytes Relative 29 (*) 3 - 12 %   Monocytes Absolute 2.4 (*) 0.1 - 1.0 K/uL   Eosinophils Relative 2  0 - 5 %   Eosinophils Absolute 0.2  0.0 - 0.7 K/uL   Basophils Relative 0  0 - 1 %  Basophils Absolute 0.0  0.0 - 0.1 K/uL  BASIC METABOLIC PANEL     Status: Abnormal   Collection Time    07/21/12  9:43 PM      Result Value Range   Sodium 130 (*) 135 - 145 mEq/L   Potassium 4.5  3.5 - 5.1 mEq/L   Chloride 92 (*) 96 - 112 mEq/L   CO2 29  19 - 32 mEq/L   Glucose, Bld 116 (*) 70 - 99 mg/dL   BUN 9  6 - 23 mg/dL   Creatinine, Ser 4.09 (*) 0.50 - 1.10 mg/dL   Calcium 9.1  8.4 - 81.1 mg/dL   GFR calc non Af Amer >90  >90 mL/min   GFR calc Af Amer >90  >90 mL/min   Dg Chest 2 View  07/21/2012  IMPRESSION: Bronchitis with superimposed patchy infiltrates.   Original Report Authenticated By: Rudie Meyer, M.D.      Marikay Alar, MD 07/22/2012, 3:02 AM   ----------------------------------------------------------------- UPPER LEVEL ADDENDUM  Pt seen and examined. Agree w/ the above H&P w/ changes made in [facility]  Shelly Flatten, MD Family Medicine PGY-2 07/22/2012, 6:49 AM

## 2012-07-23 LAB — GLUCOSE, CAPILLARY
Glucose-Capillary: 222 mg/dL — ABNORMAL HIGH (ref 70–99)
Glucose-Capillary: 134 mg/dL — ABNORMAL HIGH (ref 70–99)
Glucose-Capillary: 218 mg/dL — ABNORMAL HIGH (ref 70–99)
Glucose-Capillary: 195 mg/dL — ABNORMAL HIGH (ref 70–99)
Glucose-Capillary: 157 mg/dL — ABNORMAL HIGH (ref 70–99)

## 2012-07-23 LAB — CBC
Hemoglobin: 10.1 g/dL — ABNORMAL LOW (ref 12.0–15.0)
MCHC: 33 g/dL (ref 30.0–36.0)
RDW: 21.5 % — ABNORMAL HIGH (ref 11.5–15.5)
WBC: 6 10*3/uL (ref 4.0–10.5)

## 2012-07-23 MED ORDER — GLYCOPYRROLATE 1 MG PO TABS
2.0000 mg | ORAL_TABLET | Freq: Three times a day (TID) | ORAL | Status: DC | PRN
  Filled 2012-07-23: qty 2

## 2012-07-23 MED ORDER — SODIUM CHLORIDE 0.9 % IV SOLN
INTRAVENOUS | Status: DC
  Administered 2012-07-23 – 2012-07-24 (×2): via INTRAVENOUS

## 2012-07-23 NOTE — Progress Notes (Signed)
Family Medicine Teaching Service Attending Note  I interviewed and examined patient Megan [name] and reviewed their tests and x-rays.  I discussed with Dr. Fara Boros and reviewed their note for today.  I agree with their assessment and plan.     Additionally  Non responsive Agree with continued antibiotics Most likely a lower and/or upper respiratory source of infection - probably viral

## 2012-07-23 NOTE — ED Provider Notes (Signed)
Medical screening examination/treatment/procedure(s) were performed by non-physician practitioner and as supervising physician I was immediately available for consultation/collaboration.  Juliet Rude. Rubin Payor, MD 07/23/12 [phone]

## 2012-07-23 NOTE — Progress Notes (Signed)
Family Medicine Teaching Service Daily Progress Note Service Page: [phone]  Subjective: No family at bedside again this AM. Appears comfortable, sleeping, no increase WOB. On 2L [place]- slow fall in her O2 sat from 99 to 90% at which time 1L [place] was restarted with immediate improvement to 98%   Objective: Temp:  [97.5 F (36.4 C)-101.6 F (38.7 C)] 101.6 F (38.7 C) (03/29 0554) Pulse Rate:  [108-110] 110 (03/29 0554) Resp:  [18] 18 (03/29 0554) BP: (116-128)/(80-91) 116/91 mmHg (03/29 0554) SpO2:  [100 %] 100 % (03/29 0554) Exam: General: NAD, resting comfortably in bed Cardiovascular: RRR, no murmur Respiratory: CTAB with referred upper airway signs; no increased WOB; copious amounts of thin, clear secretions from mouth, also with some from nose  Abdomen: soft, NT, ND, PEG tube in place Extremities: no edema   Results for orders placed during the hospital encounter of 07/21/12 (from the past 24 hour(s))  GLUCOSE, CAPILLARY     Status: Abnormal   Collection Time    07/22/12  9:41 AM      Result Value Range   Glucose-Capillary 250 (*) 70 - 99 mg/dL  BASIC METABOLIC PANEL     Status: Abnormal   Collection Time    07/22/12 10:00 AM      Result Value Range   Sodium 133 (*) 135 - 145 mEq/L   Potassium 3.9  3.5 - 5.1 mEq/L   Chloride 95 (*) 96 - 112 mEq/L   CO2 30  19 - 32 mEq/L   Glucose, Bld 237 (*) 70 - 99 mg/dL   BUN 8  6 - 23 mg/dL   Creatinine, Ser 1.47  0.50 - 1.10 mg/dL   Calcium 8.9  8.4 - 82.9 mg/dL   GFR calc non Af Amer >90  >90 mL/min   GFR calc Af Amer >90  >90 mL/min  GLUCOSE, CAPILLARY     Status: Abnormal   Collection Time    07/22/12 10:38 AM      Result Value Range   Glucose-Capillary 236 (*) 70 - 99 mg/dL  GLUCOSE, CAPILLARY     Status: Abnormal   Collection Time    07/22/12 12:30 PM      Result Value Range   Glucose-Capillary 183 (*) 70 - 99 mg/dL  GLUCOSE, CAPILLARY     Status: Abnormal   Collection Time    07/22/12  4:08 PM      Result Value  Range   Glucose-Capillary 183 (*) 70 - 99 mg/dL  GLUCOSE, CAPILLARY     Status: Abnormal   Collection Time    07/22/12  8:03 PM      Result Value Range   Glucose-Capillary 213 (*) 70 - 99 mg/dL  GLUCOSE, CAPILLARY     Status: Abnormal   Collection Time    07/22/12 11:41 PM      Result Value Range   Glucose-Capillary 186 (*) 70 - 99 mg/dL  GLUCOSE, CAPILLARY     Status: Abnormal   Collection Time    07/23/12  3:55 AM      Result Value Range   Glucose-Capillary 222 (*) 70 - 99 mg/dL  GLUCOSE, CAPILLARY     Status: Abnormal   Collection Time    07/23/12  8:19 AM      Result Value Range   Glucose-Capillary 134 (*) 70 - 99 mg/dL    Imaging/Diagnostic Tests: Dg Chest 2 View  07/21/2012 IMPRESSION: Bronchitis with superimposed patchy infiltrates.   Original Report Authenticated By: Rudie Meyer,  M.D.    Assessment/Plan: Megan [name] is a 32 y.o. year old female with a history of MRCP, hypothyroidism, seizures presenting with several month history of cough and nasal congestion.   1. Pneumonia: patient with several month history of cough and congestion now with patchy infiltrates on CXR concerning for pneumonia. With patient difficulty in dealing with ariway secretions there is concern for aspiration PNA in addition to CAP. Notably with no leukocytosis.   -mucinex and pseudoephedrine for secretions --> restart robinul given her copious thin secretions -s/p azithro and CTX in ED; now on IV clinda and azithro to give coverage for aspiration and CAP -->  Will wait to transition to PO since was febrile this AM on IV abx -tylenol prn for fever; most recent fever this morning to 101.6 -will c/s RT for recs on suctioning airway  -continue home zyrtec  -follow WBC and fever curve --> WBC 6.0 today   2. Seizures: will continue current home medications, depakene, primidone, dilantin-will convert to    3. Hyponatremia: resolved  4. Hypoglycemia: CBG of 27 on 3/28 AM; likely due to inability to  use PEG tube at that time -currently on D5NS @ 85cc/hr with improvement of CBGs; multiple in the 200's -will change fluids to just NS and continue to monitor CBGs closely now that PEG tube is being used again  FEN/GI: NS @ 75 mL/hr, nutrition consulted --> will continue home regimen of 1 can Jevity 1.2 QID Prophylaxis: SQ heparin, zantac  Disposition: discharge pending improvement in symptoms  Code: will need to discuss once family is present   [facility], MD 07/23/2012, 8:34 AM

## 2012-07-24 DIAGNOSIS — Z5189 Encounter for other specified aftercare: Secondary | ICD-10-CM

## 2012-07-24 DIAGNOSIS — R633 Feeding difficulties: Secondary | ICD-10-CM

## 2012-07-24 NOTE — Progress Notes (Signed)
Patient ID: Megan [name], female   DOB: 23-Dec-1980, 32 y.o.   MRN: [id number] Family Medicine Teaching Service Daily Progress Note Service Page: [phone]  Subjective:  Patient non-communicative due to MRCP.  She continues to use supplemental oxygen and still has course breath sounds on exam.  Unsure if this is baseline respiratory status.  Afebrile overnight.  Objective: Temp:  [97.7 F (36.5 C)-99.2 F (37.3 C)] 97.7 F (36.5 C) (03/30 0646) Pulse Rate:  [88-103] 88 (03/30 0646) Resp:  [18] 18 (03/30 0646) BP: (93-116)/(53-76) 116/76 mmHg (03/30 0646) SpO2:  [94 %-100 %] 99 % (03/30 0646)  Exam: General: NAD, resting comfortably in bed Oropharynx: tongue is dry; no evidence of secretions from nose or mouth as noted yesterday Cardiovascular: RRR, no murmur Respiratory: Transmitted upper airway sounds; good air entry; no increased WOB Abdomen: soft, NT, ND, +BS Extremities: no edema  I have reviewed the patient's medications, labs, imaging, and diagnostic testing.  Notable results are summarized below.  CBC BMET   Recent Labs Lab 07/21/12 2143 07/22/12 0657 07/23/12 0841  WBC 8.4 8.4 6.0  HGB 11.2* 10.7* 10.1*  HCT 35.3* 33.5* 30.6*  PLT 137* 118* 103*    Recent Labs Lab 07/21/12 2143 07/22/12 0657 07/22/12 1000  NA 130* 135 133*  K 4.5 4.1 3.9  CL 92* 97 95*  CO2 29 29 30   BUN 9 7 8   CREATININE 0.49* 0.57 0.51  GLUCOSE 116* 27* 237*  CALCIUM 9.1 8.7 8.9      Assessment/Plan: Megan [name] is a 32 y.o. year old female with a history of MRCP, hypothyroidism, seizures presenting with several month history of cough and nasal congestion.   1. Pneumonia: patient with several month history of cough and congestion now with patchy infiltrates on CXR concerning for pneumonia. With patient difficulty in dealing with airway secretions there is concern for aspiration PNA in addition to CAP. Notably with no leukocytosis.   - Robinul, mucinex, and pseudoephedrine for  secretions which seem to be improving - Continue IV clinda and azithro to give coverage for aspiration and CAP -->  Will likely transition to tube in AM  - Tylenol prn for fever; most recent fever 101.6 on 07/23/12 - will c/s RT for recs on suctioning airway  - continue home zyrtec   2. Hypoxia: patient has been requiring 1-2 liters O2 since yesterday - Will attempt trial off oxygen and monitor oxygen level  3. Seizures: will continue current home medications, depakene, primidone, dilantin  4. Hypoglycemia: CBG of 27 on 3/28 AM; likely due to inability to use PEG tube at that time - D5 has been changed to NS - continue to monitor CBGs closely now that PEG tube is being used again  FEN/GI: NS @ 75 mL/hr, nutrition consulted --> will continue home regimen of 1 can Jevity 1.2 QID Prophylaxis: SQ heparin, zantac  Disposition: discharge pending improvement in symptoms  Code: will need to discuss once family is present  DE LA CRUZ,Megan Howlett, DO 07/24/2012, 8:56 AM

## 2012-07-24 NOTE — Progress Notes (Signed)
FMTS Attending Admission Note: Megan Duarte,MD I  have seen and examined this patient, reviewed their chart. I have discussed this patient with the resident. I agree with the resident's findings, assessment and care plan.  

## 2012-07-24 NOTE — Progress Notes (Signed)
Upon entering patient's room, vomit was observed on patient. CPT will not be given. RN notified.

## 2012-07-25 DIAGNOSIS — R05 Cough: Secondary | ICD-10-CM

## 2012-07-25 LAB — CBC
HCT: 35.7 % — ABNORMAL LOW (ref 36.0–46.0)
MCHC: 31.9 g/dL (ref 30.0–36.0)
MCV: 97.3 fL (ref 78.0–100.0)
Platelets: 159 10*3/uL (ref 150–400)
RDW: 21 % — ABNORMAL HIGH (ref 11.5–15.5)

## 2012-07-25 LAB — BASIC METABOLIC PANEL
BUN: 7 mg/dL (ref 6–23)
Calcium: 9 mg/dL (ref 8.4–10.5)
Creatinine, Ser: 0.42 mg/dL — ABNORMAL LOW (ref 0.50–1.10)
GFR calc Af Amer: >90 mL/min (ref >90)
GFR calc non Af Amer: >90 mL/min (ref >90)

## 2012-07-25 MED ORDER — VALPROIC ACID 250 MG/5ML PO SYRP
625.0000 mg | ORAL_SOLUTION | Freq: Four times a day (QID) | ORAL | Status: DC
  Administered 2012-07-25: 625 mg via ORAL
  Filled 2012-07-25 (×5): qty 15

## 2012-07-25 MED ORDER — AZITHROMYCIN 200 MG/5ML PO SUSR
250.0000 mg | Freq: Every day | ORAL | Status: DC
  Administered 2012-07-25: 250 mg via ORAL
  Filled 2012-07-25: qty 10

## 2012-07-25 MED ORDER — PHENYTOIN 125 MG/5ML PO SUSP
100.0000 mg | Freq: Three times a day (TID) | ORAL | Status: DC
  Administered 2012-07-25: 100 mg
  Filled 2012-07-25 (×4): qty 4

## 2012-07-25 MED ORDER — CLINDAMYCIN PALMITATE HCL 75 MG/5ML PO SOLR
300.0000 mg | Freq: Four times a day (QID) | ORAL | Status: DC
  Administered 2012-07-25: 300 mg via ORAL
  Filled 2012-07-25 (×6): qty 20

## 2012-07-25 MED ORDER — CLINDAMYCIN PALMITATE HCL 75 MG/5ML PO SOLR: 300.0000 mg | Freq: Four times a day (QID) | ORAL | Status: DC

## 2012-07-25 NOTE — Progress Notes (Signed)
Patient ID: Megan [name], female   DOB: 1980-09-23, 32 y.o.   MRN: [id number] Family Medicine Teaching Service Daily Progress Note Service Page: [phone]  Subjective:  Patient non-communicative due to MRCP.  Afebrile overnight. No O2 requirement over night.  Objective: Temp:  [97.2 F (36.2 C)-97.9 F (36.6 C)] 97.9 F (36.6 C) (03/31 0703) Pulse Rate:  [52-95] 52 (03/31 0703) Resp:  [18] 18 (03/31 0703) BP: (125-135)/(78-88) 135/79 mmHg (03/31 0703) SpO2:  [94 %-99 %] 94 % (03/31 0800)  Exam: General: NAD, resting comfortably in bed Oropharynx: tongue is dry; no evidence of secretions from nose or mouth Cardiovascular: RRR, no murmur Respiratory: Transmitted upper airway sounds; good air entry; no increased WOB Abdomen: soft, NT, ND, +BS Extremities: no edema  CBC BMET   Recent Labs Lab 07/22/12 0657 07/23/12 0841 07/25/12 0815  WBC 8.4 6.0 6.0  HGB 10.7* 10.1* 11.4*  HCT 33.5* 30.6* 35.7*  PLT 118* 103* 159    Recent Labs Lab 07/21/12 2143 07/22/12 0657 07/22/12 1000  NA 130* 135 133*  K 4.5 4.1 3.9  CL 92* 97 95*  CO2 29 29 30   BUN 9 7 8   CREATININE 0.49* 0.57 0.51  GLUCOSE 116* 27* 237*  CALCIUM 9.1 8.7 8.9      Assessment/Plan: Megan [name] is a 32 y.o. year old female with a history of MRCP, hypothyroidism, seizures presenting with several month history of cough and nasal congestion.   1. Pneumonia: patient with several month history of cough and congestion now with patchy infiltrates on CXR concerning for pneumonia. With patient difficulty in dealing with airway secretions there is concern for aspiration PNA in addition to CAP. Notably with no leukocytosis.   - Robinul, mucinex, and pseudoephedrine for secretions which seem to be improving - IV clinda and azithro to give coverage for aspiration and CAP -->  Will transition to tube today  - Tylenol prn for fever; most recent fever 101.6 on 07/23/12 - will c/s RT for recs on suctioning airway  - continue  home zyrtec   2. Hypoxia: resolved-now stable on room air  3. Seizures: will continue current home medications depakene, primidone, dilantin-now all PO  4. Hypoglycemia: CBG of 27 on 3/28 AM; likely due to inability to use PEG tube at that time - continue to monitor CBGs closely now that PEG tube is being used again  FEN/GI: KVO, nutrition consulted --> will continue home regimen of 1 can Jevity 1.2 QID Prophylaxis: SQ heparin, zantac  Disposition: likely discharge today if tolerates antibiotics through tube  Code: will need to discuss once family is present  Marikay Alar, MD 07/25/2012, 9:19 AM

## 2012-07-25 NOTE — Progress Notes (Signed)
I examined this patient and discussed the care plan with Dr Birdie Sons and the [facility] team and agree with assessment and plan as documented in the progress note above. I saw her on admission and her marked nasal congestion and drainage have improved markedly.

## 2012-07-25 NOTE — Progress Notes (Signed)
Patient due for a bolus feed through tube. While checking gastric residual blood tinged thick residual was mixed with the gastric return. Stopped residual checking around 120 ml and called the MD to make aware of the blood tinged return. MD orders received. Will continue to monitor. Patient also coughed up mucus that was blood tinged and another with white frothy sputum.

## 2012-07-26 NOTE — Discharge Summary (Signed)
I examined this patient and discussed the care plan with Dr Birdie Sons and the [facility] team and agree with assessment and plan as documented in the discharge note above.

## 2012-07-28 ENCOUNTER — Ambulatory Visit (INDEPENDENT_AMBULATORY_CARE_PROVIDER_SITE_OTHER): Payer: Medicaid Other | Admitting: Family Medicine

## 2012-07-28 VITALS — BP 126/90 | HR 115 | Temp 99.3°F

## 2012-07-28 DIAGNOSIS — J189 Pneumonia, unspecified organism: Secondary | ICD-10-CM

## 2012-07-28 MED ORDER — CLINDAMYCIN PALMITATE HCL 75 MG/5ML PO SOLR: 300.0000 mg | Freq: Four times a day (QID) | ORAL | Status: AC

## 2012-07-28 NOTE — Assessment & Plan Note (Signed)
Improving.  Azithro was sent to pharmacy rather than clinda at d/c, so has had 5 days of clinda and 7 days of azithro.  Family is very insistent on having an Rx for the clindamycin, despite her clinical improvement. Counseled on the risks of continued antibiotics, including yeast and GI infections, but family would like to give the last 2 days of therapy despite this. Rx sent to the pharmacy.  F/u next week for recheck.  Repeat CXR low yield, as likely won't have improved despite clinical improvement- will discuss this with family at next visit if necessary.  Will need to recheck O2 sat as well, if <88% and does not improve s/p cough, may need to have HH O2 initiated.  I am hopeful this will not be the case, however, since she has never needed home O2 before.  Will also need to address code status but given patient's family's already confrontational attitude, will defer to next visit.

## 2012-07-28 NOTE — Patient Instructions (Addendum)
It was good to see you today.  I'm glad Megan [name] is doing better!  I will send in the last 2 days of her antibiotics.    Bring her back in 1 week, just so we can make sure she is continuing to do well.  If she starts having vaginal discharge call and I will send in the yeast infection medicine.  If she starts having profuse diarrhea, bring her back sooner.

## 2012-07-28 NOTE — Progress Notes (Signed)
S: Pt comes in today for hospital follow up.  She was admitted from 3/27-3/31/14 for pneumonia, with fever, new O2 requirement, and patchy infiltrates on CXR.  During admission, she was treated with 5 day course of azithro (IV) and total of 7 day course of clindamycin (IV and po; for aspiration).  She had a new O2 requirement for part of her stay, but was satting well on room air at d/c.  She also had copious secretions, which responded well to robinul, mucinex, and pseudophed.  She did require deep suctioning via RT.    Since discharge home, her family reports she is doing well.  Family somewhat confrontational about not getting correct antibiotics-- azithro was sent to pharmacy, not clinda.  Despite the wrong antibiotic, Megan [name] is doing well, much better per the family.  She is not having copious secretions, and they have not been giving her robinul due to it making her secretions too thick prior to hospital admission.  They are giving her mucinex but did not know to give her pseudophed.  She has not had any fevers at home.  She is acting like herself.  The family is very insistent on having an Rx for the clindamycin, despite her clinical improvement.    ROS: Per HPI  History  Smoking status  . Never Smoker   Smokeless tobacco  . Never Used    O:  Filed Vitals:   07/28/12 1412  BP: 126/90  Pulse: 115  Temp: 99.3 F (37.4 C)  O2: 89%  Gen: NAD, mouth open, minimal drooling/secretions  CV: RRR, no murmur Pulm: upper airway transmitted noises, scattered wheezes throughout    A/P: 32 y.o. female p/w HFU for PNA -See problem list -f/u in 1 week

## 2012-08-04 ENCOUNTER — Ambulatory Visit (INDEPENDENT_AMBULATORY_CARE_PROVIDER_SITE_OTHER): Payer: Medicaid Other | Admitting: Family Medicine

## 2012-08-04 ENCOUNTER — Encounter: Payer: Self-pay | Admitting: Family Medicine

## 2012-08-04 VITALS — BP 126/86 | HR 88 | Wt 96.0 lb

## 2012-08-04 DIAGNOSIS — J189 Pneumonia, unspecified organism: Secondary | ICD-10-CM

## 2012-08-04 NOTE — Progress Notes (Signed)
S: Pt comes in today for follow up.  Was seen last week on 4/3 for hospital follow up.  At that time, it was discovered there was a medication mix-up and rather than getting the final 2 days of her clinda at d/c, she was Rx'ed azithro, which had already been completed while in the hospital.  The final 2 days of Clinda were sent to the pharmacy.  Last week, she had not had any fevers and seemed to be starting to do better.  O2 was 89% on RA, is now 97% today.  According to family, she is pretty much back to normal.  She continues to have no fevers.  She still has a little cough, but it is improving.  Her secretions have been manageable not needing robinul at this time.  She is much more awake an interactive.  The family has no concerns or complaints today; are very happy with how well she is doing.   She did not have any major side effects from the antibiotics (no vaginal d/c), is having some mild thick diarrhea, a few times per day, but non-bloody and has been doing well, so I doubt C diff.    ROS: Per HPI  History  Smoking status  . Never Smoker   Smokeless tobacco  . Never Used    O:  Filed Vitals:   08/04/12 1439  BP: 126/86  Pulse: 88  O2: 97% on RA  Gen: NAD, minimal oral secretions  CV: RRR, no murmur Pulm: clear, some transmitted upper airway noises but overall clear without wheezes, crackles    A/P: 32 y.o. female p/w resolving PNA -See problem list -f/u in PRN

## 2012-08-04 NOTE — Assessment & Plan Note (Signed)
Much better this week.  Completed antibiotics, mild diarrhea.  No fevers. O2 97% RA. F/u PRN.

## 2012-09-05 ENCOUNTER — Telehealth: Payer: Self-pay | Admitting: Family Medicine

## 2012-09-05 NOTE — Telephone Encounter (Signed)
Form completed.

## 2012-09-05 NOTE — Telephone Encounter (Signed)
[name] notified [employer] forms are ready to be picked up at front desk.  Ileana Ladd

## 2012-09-05 NOTE — Telephone Encounter (Signed)
Pt's aunt needs [employer] transportation paper work filled out.

## 2012-09-27 ENCOUNTER — Ambulatory Visit (INDEPENDENT_AMBULATORY_CARE_PROVIDER_SITE_OTHER): Payer: Medicaid Other | Admitting: Family Medicine

## 2012-09-27 VITALS — BP 131/88 | HR 98 | Temp 97.0°F

## 2012-09-27 DIAGNOSIS — R05 Cough: Secondary | ICD-10-CM

## 2012-09-27 DIAGNOSIS — G809 Cerebral palsy, unspecified: Secondary | ICD-10-CM

## 2012-09-27 NOTE — Assessment & Plan Note (Signed)
PT ordered.

## 2012-09-27 NOTE — Progress Notes (Signed)
S: Pt comes in today for referral to PT.  Grandmom reports that they are trying to get a new bathroom at home so that they can get a tub to bath Megan [name] in-- right now they only have a shower and it is very difficult to clean her.  Their case manage told them to get a PT referral to start this process.   They also report she has had a cough. Started on Friday (4 days ago)-- is productive.  No fever but did feel warm yesterday and grandmom gave her Motrin.  Has been mostly acting like herself, but has maybe been a little more sleepy than usual.  No increased WOB, no retractions, seems to be handling secretions well.  Is not using Robinul, is using Delsym and Zyrtec.  Grand mom does not think she needs a chest xray yet.    ROS: Per HPI  History  Smoking status  . Never Smoker   Smokeless tobacco  . Never Used    O:  Filed Vitals:   09/27/12 1558  BP: 131/88  Pulse: 98  Temp: 97 F (36.1 C)    Gen: NAD, thin secretions coming from open mouth CV: RRR, no murmur Pulm: CTA bilat, no wheezes or crackles appreciate but pt unable to cooperate with exam so poor effort   A/P: 32 y.o. female p/w cough, Cerebral Palsy  -See problem list -f/u in PRN

## 2012-09-27 NOTE — Patient Instructions (Signed)
It was good to see you today.  I put in the referral for physical therapy-- let me know if there is anything else you need me to do to help with getting everything you need at home.  We will hold off on the chest xray-- if you feel like she is getting worse, please call and I will order the xray- you don't need to come back in just to get it done.  Come back as needed.

## 2012-09-27 NOTE — Assessment & Plan Note (Signed)
No signs of respiratory distress, appears well and at her baseline today.  Grandmom would like to wait on a chest xray-- aware of the reasons to call and ask [place] to put one it-- does not need an OV, I will just place the CXR order if needed.

## 2012-10-03 ENCOUNTER — Telehealth: Payer: Self-pay | Admitting: Family Medicine

## 2012-10-03 NOTE — Telephone Encounter (Signed)
Called pt's aunt and informed, that the order was sent to rehab already. Number given.  Megan [name], [name]

## 2012-10-03 NOTE — Telephone Encounter (Signed)
The patients Aunt is calling to check on the status of the referral for Physical Therapy that was placed on 6/3.

## 2012-10-04 ENCOUNTER — Ambulatory Visit: Payer: Medicaid Other | Attending: Family Medicine | Admitting: Physical Therapy

## 2012-10-04 ENCOUNTER — Ambulatory Visit: Payer: Medicaid Other | Admitting: Physical Therapy

## 2012-10-05 ENCOUNTER — Telehealth: Payer: Self-pay | Admitting: Family Medicine

## 2012-10-05 NOTE — Telephone Encounter (Signed)
Will forward to Dr McGill 

## 2012-10-05 NOTE — Telephone Encounter (Signed)
Please contact pattie martin at [place]  [phone] fax  Phone [phone] for authorization for food since she is tube fed

## 2012-10-06 NOTE — Telephone Encounter (Signed)
Phone number is incorrect.  Will contact once correct phone number is retrieved. Thanks

## 2012-10-10 ENCOUNTER — Ambulatory Visit: Payer: Medicaid Other | Admitting: Physical Therapy

## 2012-10-10 ENCOUNTER — Other Ambulatory Visit: Payer: Self-pay | Admitting: *Deleted

## 2012-10-10 DIAGNOSIS — R569 Unspecified convulsions: Secondary | ICD-10-CM

## 2012-10-10 MED ORDER — PHENYTOIN 125 MG/5ML PO SUSP: ORAL | Status: DC

## 2012-10-13 ENCOUNTER — Ambulatory Visit: Payer: Medicaid Other | Admitting: Physical Therapy

## 2012-10-14 ENCOUNTER — Telehealth: Payer: Self-pay | Admitting: Family Medicine

## 2012-10-14 NOTE — Telephone Encounter (Signed)
The patients guardian would like to speak to Dr. Fara Boros about needing a letter about the patients condition in order to get the home modifications that the patient needs.  The phone number that needs to be called is [phone] - Joselyn Glassman.  [facility] also needs to be called for an order for 1.2 gevity.  She says that all of this needs to happen as soon as possible.

## 2012-10-17 ENCOUNTER — Telehealth: Payer: Self-pay | Admitting: Family Medicine

## 2012-10-17 NOTE — Telephone Encounter (Signed)
Need prescription for Jevity sent to [facility] and Report/eval for home modification for stander? - please call when this is ready and she will pick up. PLease see me for any questions. Wyatt Haste, RN-BSN

## 2012-10-17 NOTE — Telephone Encounter (Signed)
Call from [facility] with [facility] [phone]) needing a written Rx for patient's food supplement, Jevilty.  Please fax to [phone] and call before we fax.  Will fwd to PCP.  Shamond Skelton, Darlyne Russian, CMA

## 2012-10-19 DIAGNOSIS — G40309 Generalized idiopathic epilepsy and epileptic syndromes, not intractable, without status epilepticus: Secondary | ICD-10-CM | POA: Insufficient documentation

## 2012-10-19 DIAGNOSIS — F72 Severe intellectual disabilities: Secondary | ICD-10-CM | POA: Insufficient documentation

## 2012-10-19 DIAGNOSIS — K117 Disturbances of salivary secretion: Secondary | ICD-10-CM | POA: Insufficient documentation

## 2012-10-19 DIAGNOSIS — G40319 Generalized idiopathic epilepsy and epileptic syndromes, intractable, without status epilepticus: Secondary | ICD-10-CM

## 2012-10-19 DIAGNOSIS — Z79899 Other long term (current) drug therapy: Secondary | ICD-10-CM

## 2012-10-19 NOTE — Telephone Encounter (Signed)
[name] states she needs a letter stating that patient and guardian cannot hold themselves up to get a shower and needs a bathtub put in.  Also, patients uses a "stander" at school.  A piece of equipment that pt is able to stand for a few hours in to strengthen her leg muscles.  Vonda Harth, Darlyne Russian, CMA

## 2012-10-19 NOTE — Telephone Encounter (Signed)
They will need to tell me what home modifications are needed and how it needs to be written (letter vs script). Rx for [employer] written.

## 2012-10-19 NOTE — Telephone Encounter (Signed)
Rx written, placed in to be faxed

## 2012-10-20 ENCOUNTER — Encounter: Payer: Self-pay | Admitting: Family Medicine

## 2012-10-20 ENCOUNTER — Telehealth: Payer: Self-pay | Admitting: Family Medicine

## 2012-10-20 DIAGNOSIS — G808 Other cerebral palsy: Secondary | ICD-10-CM

## 2012-10-20 NOTE — Telephone Encounter (Signed)
Letter written and routed to red team.

## 2012-10-20 NOTE — Telephone Encounter (Signed)
Will FWD to MD.  Xane Amsden L, CMA  

## 2012-10-20 NOTE — Telephone Encounter (Signed)
Letter placed up front for pick up.  Megan [name] will be picking letter up today.  Megan [name], Megan [name], CMA

## 2012-10-20 NOTE — Telephone Encounter (Signed)
Can they please send me an order form that they would like me to fill out then, I'm not sure what they need the Rx to say. Thanks!

## 2012-10-20 NOTE — Telephone Encounter (Signed)
Received order for patient for tube feeding supplies.  Need clarification for it.  Only given the formula.  Did not specify if feeding by syringe or pump.  Please update  rx for order giving specifics for method of tube feeding.  Can fax to: [phone].

## 2012-10-20 NOTE — Telephone Encounter (Signed)
Spoke with Megan [name] she just needs the Rx to say Jevity 1.2 1 can 4 X daily.  Dispense with syringe.  [facility] wrote Rx and I faxed to [facility] [phone]. Ferdinando Lodge, Maryjo Rochester

## 2012-10-20 NOTE — Telephone Encounter (Signed)
LMOVM for Megan [name] that MD worte letter for her.  I need to know what to do with letter now.  Shyasia Funches, Darlyne Russian, CMA

## 2012-11-04 ENCOUNTER — Ambulatory Visit (INDEPENDENT_AMBULATORY_CARE_PROVIDER_SITE_OTHER): Payer: Medicaid Other | Admitting: Family Medicine

## 2012-11-04 ENCOUNTER — Encounter: Payer: Self-pay | Admitting: Family Medicine

## 2012-11-04 VITALS — BP 111/79 | HR 65 | Temp 98.2°F

## 2012-11-04 DIAGNOSIS — R569 Unspecified convulsions: Secondary | ICD-10-CM

## 2012-11-04 DIAGNOSIS — J309 Allergic rhinitis, unspecified: Secondary | ICD-10-CM

## 2012-11-04 DIAGNOSIS — E039 Hypothyroidism, unspecified: Secondary | ICD-10-CM

## 2012-11-04 HISTORY — DX: Allergic rhinitis, unspecified: J30.9

## 2012-11-04 MED ORDER — FEXOFENADINE HCL 30 MG/5ML PO SUSP: 30.0000 mg | Freq: Every day | ORAL | Status: DC

## 2012-11-04 NOTE — Assessment & Plan Note (Signed)
Patient managed on Phenytoin and Valproic Acid by Neurology; she has followup with Neuro on July 23rd.  Unsure when last levels checked (per EMR, last checked 2011).  Discussed with family, as patient may be having these checked by Neurology.

## 2012-11-04 NOTE — Progress Notes (Signed)
Subjective:     Patient ID: Megan [name], female   DOB: 1980/07/24, 32 y.o.   MRN: [id number]  HPI Pt is in clinic today with her cousin and aunt who are her normal caregivers. HPI was obtained from them as Megan [name] is unable to provide Hx due to her cerebral palsy.  Megan [name] is a 32 yo woman with a Hx of cerebral palsy, epilepsy, allergies and recently cleared episode of PNA that cleared this past April who presents with a 2 month history of congestion, cough, and "pink eyes". Per pt's caregivers, they are worried due to the persistent coughing despite resolution of PNA months ago. Pt is taking all medications as listed. They state that both her eyes have been red w/o discharge, and that she is coughing up a thick, white sputum, that sometimes forms "large bubbles". They have not noted anything that makes the pt's symptoms better or worse. Increased sleepiness is reported both at home and at school. No fevers, vomiting, pain, change in BMs (freq/consistency/color) or urination present.  The caregivers of Megan [name] report no changes in environmental triggers (pets, house cleaning) that might be causing the pt's symptoms. They admit to smoking at home but state that they don't do it around her and always try to step out. They also report that she has been going outside and on field trips at school.   Attending Note:  Patient seen and examined by me together with Megan [name], Megan [name], I have reviewed and agree with his documentation.  Briefly, patient here today for concerns about nasal congestion/discharge, some redness of both eyes that waxes and wanes, and cough with white sputum, over the past 2 months.  No fevers or chills, no clear triggers that family (cousin) can identify.  Megan [name] usually demonstrates pain by crying, has not appeared in distress regarding this complaint.  Has been treated in the past for recurrent pneumonia, most recently in March 2014, after which she improved.  She continues to  accept J-tube feeds, and she has normal bowel movements without diarrhea or constipation.  Of note, she is on valproic acid and phenytoin for history of seizure d/o; also on LT4 for hypothyroidism>  Family reports that she does have labs checked by Neurology, her next neuro appointment is scheduled for July 23rd.  Megan [name] is a difficult stick for lab work, family would prefer to have all labs drawn at same time.  Megan [name]  Review of Systems As per HPI    Objective:   Physical Exam Physical exam possibly complicated by pt's inability to follow commands.  Gen: Pt is wheel-chair bound and unable to communicate. No apparent respiratory distress.   CV: Normal S1 and S2. No murmurs, rubs or gallops. Radial pulses 2+ bilaterally.  Pulm: Clear and equal to auscultation bilaterally. No use of accessory muscles while breathing.  Abd: G tube in place with no erythema, warmth, or other sign of infxn at site.   HEENT: NCAT. TMs clear with bony landmarks visualized, some cerumen present in ear canal. No conjunctival pallor or scleral icterus. Eyes appear unaligned and do not appear significantly red on exam. Gingival hyperplasia present. MMM. No exudates or erythema present in throat. No pain on palpation of maxillary, frontal, sphenoid and ethmoid sinuses. Nasal turbinates   Attending Exam;  Patient in wheelchair, not ambulatory, non-verbal.  No acute distress. Moist mucus membranes.  HEENT: TMs clear bilaterally. No cervical adenopathy. No discharge noted in nasal fossae. Nontender to palpation of maxillary/frontal  sinuses bilaterally. Neck supple.  COR Regular S1S2, no extra sounds PULM Patient unable to follow commands during exam; no wheezes/rales observed in auscultation.  ABD: J-tube button Clean/dry/intact, no surrounding erythema, nontender abdomen. Megan [name]  Problem List: 1. Allergies 2. Cough with thick white sputum 3. Congestion 4. Eye redness 5. Sleepiness 6. Chronic conditions - epilepsy,  hypothyroidism     Assessment:     This is a 32 yo woman with a Hx of resolved PNA, and allergies who is presenting in the summer months with cough, congestion, and eye irritation of 2 mos duration.    Plan:     1. Coughing, congestion, eye irritation, and fatigue during summer months with a previous Hx of allergies is consistent with allergic rhinitis. Other items on the differential include viral URI, bacterial infxn, sinusitis and a recurrent PNA. However, lack of physical exam findings (no wheezing/crackles on auscultation, lack of fever/chills) make these items less likely. For now, the plan is to change her current allergy medication from cetirizine to fexofenadine. Will f/u 3-4 wks from today to evaluate resolution of current symptoms.   2. Chronic conditions - Pt is currently on valproate and phenytoin, as well as levothyroxine, which all require lab monitoring. This was discussed with pt's caregivers who stated that they had a neurologist appt for the pt on July 23 and it was agreed that they would get both TSH and epilepsy medication labs drawn at this visit. If this is not possible, then proper f/u on TSH levels will be done at next f/u appt.

## 2012-11-04 NOTE — Patient Instructions (Addendum)
It was a pleasure to meet Megan [name] in our office today.  As we discussed, I believe her cough and nasal secretions are allergy-related.  I have changed her Cetirizine to Fexofenadine (Allegra) syrup, one teaspoon via the tube, one time daily.  Please hold the cetirizine for now.   As I mentioned, Megan [name] appears due for a check of her thyroid level (TSH).  If her neurologist is planning to check levels of her Valproic acid and/or Phenytoin levels, it would be appreciated if they could check the TSH if a blood draw is planned.  If not, we may check these things in our lab at the [facility].   I would like for Megan [name] to have an appointment to see her new doctor, Dr. Richarda Blade, in the coming 1-2 months or sooner as needed.

## 2012-11-04 NOTE — Assessment & Plan Note (Signed)
Patient's last TSH check in our EMR is from June, 2012.  Family/caregivers not sure if has been checked elsewhere.  I recommended that this be checked; family prefers to see if she will need other labs when she sees her Neurologist on July 23rd, 2014.  If not having labs, I have recommended this be checked here at next visit.  JB

## 2012-11-11 ENCOUNTER — Telehealth: Payer: Self-pay | Admitting: Family Medicine

## 2012-11-11 NOTE — Telephone Encounter (Signed)
Found the fax in pt's new MD, Dr. Richarda Blade box.  Due to Dr. Fara Boros having graduated, Dr. Lum Babe, preceptor, signed letter.  I refaxed to number listed and placed in has been faxed box.  Shyloh Krinke, Darlyne Russian, CMA

## 2012-11-11 NOTE — Telephone Encounter (Signed)
Megan [name] states that papers were faxed to [place] today 7/18 concerning Megan [name]. The paper were filled out by Dr. Fara Boros, but she forgot to sign them. They need these papers sig because Megan [name] can not get into the home without them signed. Megan [name]

## 2012-11-16 ENCOUNTER — Encounter: Payer: Self-pay | Admitting: Family

## 2012-11-16 ENCOUNTER — Ambulatory Visit (INDEPENDENT_AMBULATORY_CARE_PROVIDER_SITE_OTHER): Payer: Medicaid Other | Admitting: Family

## 2012-11-16 ENCOUNTER — Telehealth: Payer: Self-pay | Admitting: Family Medicine

## 2012-11-16 VITALS — BP 104/78 | HR 76

## 2012-11-16 DIAGNOSIS — K117 Disturbances of salivary secretion: Secondary | ICD-10-CM

## 2012-11-16 DIAGNOSIS — G40319 Generalized idiopathic epilepsy and epileptic syndromes, intractable, without status epilepticus: Secondary | ICD-10-CM

## 2012-11-16 DIAGNOSIS — R569 Unspecified convulsions: Secondary | ICD-10-CM

## 2012-11-16 DIAGNOSIS — G809 Cerebral palsy, unspecified: Secondary | ICD-10-CM

## 2012-11-16 DIAGNOSIS — Z79899 Other long term (current) drug therapy: Secondary | ICD-10-CM

## 2012-11-16 NOTE — Progress Notes (Signed)
Patient: Megan [name] MRN: [id number] Sex: female DOB: Nov 18, 1980  Provider: Elveria Rising, NP Location of Care: [facility]  Note type: Routine return visit  History of Present Illness: Referral Source: [facility] History from: family member Chief Complaint: Seizures  Megan [name] is a 32 y.o. female with history of intractable minor motor,and generalized tonic-clonic seizure activity, profound mental retardation, autistic behaviors,dysphagia with PEG tube in place, and cortical blindness. She was on Coumadin briefly in 2009.  She requires total care for all activities of daily living. She has had no tonic-clonic seizures in over 3 years according to her family member with her today but has had more twitches in her face than usual. She said that Megan [name] was hospitalized in March for pneumonia and since then has had more coughing and difficulty managing oral secretions. She has been treated with antibiotics intermittently.  Megan [name] has a pressure wound on her right elbow that is being treated with dressings.    Review of Systems: 12 system review was remarkable for cough, wheezing and frequent urination  Past Medical History  Diagnosis Date  . Cerebral palsy, quadriplegic   . Cortical blindness   . Seizures   . Mental retardation   . Cerebral palsy   . Hypertension 06/23/2010  . Vision abnormalities   . Allergic rhinitis 11/04/2012   Hospitalizations: yes, Head Injury: no, Nervous System Infections: no, Immunizations up to date: yes Past Medical History Comments: Patient has been hospitalized several times her most recent was due to pneumonia. She has dysphagia and has a peg tube.  Surgical History Past Surgical History  Procedure Laterality Date  . Peg tube placement    . Peg placement  09/03/2011    Procedure: PERCUTANEOUS ENDOSCOPIC GASTROSTOMY (PEG) REPLACEMENT;  Surgeon: Beverley Fiedler, MD;  Location: [facility];  Service: Gastroenterology;   Laterality: N/A;  . Peg placement  02/19/2012    Procedure: PERCUTANEOUS ENDOSCOPIC GASTROSTOMY (PEG) REPLACEMENT;  Surgeon: Hart Carwin, MD;  Location: [facility];  Service: Endoscopy;  Laterality: N/A;  needs gastrostomy button 24 Fr 4.4 and 3.5 available    Family History family history includes Cancer in her maternal grandfather; Diabetes in her maternal aunt, maternal grandmother, and mother; Hypertension in her other; and Stroke in her maternal grandmother. Family History is negative migraines, seizures, cognitive impairment, blindness, deafness, birth defects, chromosomal disorder, autism.  Social History History   Social History  . Marital Status: Single    Spouse Name: N/A    Number of Children: N/A  . Years of Education: N/A   Social History Main Topics  . Smoking status: Never Smoker   . Smokeless tobacco: Never Used  . Alcohol Use: No  . Drug Use: No  . Sexually Active: No   Other Topics Concern  . None   Social History Narrative  . None   Occupation: N/A Living with maternal grandmother    Current Outpatient Prescriptions on File Prior to Visit  Medication Sig Dispense Refill  . fexofenadine (ALLEGRA) 30 MG/5ML suspension Place 5 mLs (30 mg total) into feeding tube daily.  300 mL  12  . guaiFENesin (MUCINEX) 600 MG 12 hr tablet Take 1 tablet (600 mg total) by mouth 2 (two) times daily.  30 tablet  0  . levothyroxine (SYNTHROID, LEVOTHROID) 88 MCG tablet Take 88 mcg by mouth daily.      . Nutritional Supplements (JEVITY) LIQD Take 1 Can by mouth 4 (four) times daily.      Marland Kitchen  phenytoin (DILANTIN) 125 MG/5ML suspension 4 ml by mouth via g-tube three times a day  237 mL  11  . primidone (MYSOLINE) 250 MG tablet 125 mg by Per J Tube route 2 (two) times daily.       . ranitidine (ZANTAC) 75 MG/5ML syrup 5 mLs (75 mg total) by Per J Tube route 2 (two) times daily.  480 mL  5  . valproate (DEPAKENE) 250 MG/5ML syrup Take 625 mg by mouth 4 (four) times daily. 13mL po  q6hr       No current facility-administered medications on file prior to visit.   The medication list was reviewed and reconciled. All changes or newly prescribed medications were explained.  A complete medication list was provided to the patient/caregiver.  Allergies  Allergen Reactions  . Doxycycline     REACTION: Blisters / swelling    Physical Exam BP 104/78  Pulse 76 General: Well developed young woman, seated in wheelchair, drowsy at times Head: Normocephalic and atraumatic Ears, Nose and Throat: Tympanic membranes partially occluded by cerumen bilaterally. Unable to fully examine her posterior pharynx due to patient's inability to cooperate. Her tongue protrudes. Neck: supple, unable to maintain head control Respiratory: lungs clear to auscultation Cardiovascular: regular rate and rhythm. No murmurs Musculoskeletal: contractures of extremities at the knees and elbows. she has hypotonia in her trunk Skin: She has a dressing on her right elbow that I did not remove. Trunk: PEG tube intact  Neurologic Exam  Mental Status: Awake but drowsy at times. Evidence of profound mental retardation. She has no speech. She smiles on occasion. Cranial Nerves: Fundoscopic exam reveals poorly visualized disc margins. Pupils equal, briskly reactive to light. Unable to evaluate EOM's due to her inability to follow instructions. She has dysconjugate eye movements. I cannot get her to fix and follow on objects in her visual field. She turns to localize sounds but does not do so consistently. She has lower facial weakness with drooling. Her tongue protrudes at all times. Unable to adequately evaluate neck flexion and extension. Motor: She has spastic quadriplegia Sensory: Withdraws to noxious stimuli Coordination: Cannot cooperate with exam Gait and Station: wheelchair bound, does not ambulate Reflexes: diminished throughout   Assessment and Plan Megan [name] is a 32 year old woman with history of  intractable minor motor,and generalized tonic-clonic seizure activity, profound mental retardation, autistic behaviors,dysphagia with PEG tube in place, and cortical blindness. Her family member with her today reports increased facial twitching but no generalized tonic-clonic seizures. She has not had antiepileptic levels checked in some time, so I have recommended that she have blood levels drawn in the near future. I have given her lab orders and instructions for this. I will call the family when the results are received.

## 2012-11-16 NOTE — Telephone Encounter (Signed)
Pt is still have symptoms from last visit. She coughing and gagging on mucus. She is wanting to know whether to come back in or can the Dr. Richarda Blade send something to the pharmacy that will help with this. JW

## 2012-11-16 NOTE — Patient Instructions (Signed)
[name] needs to have blood drawn within the next 2 weeks. This needs to be drawn in the early morning, before her first dose of medication of the day. She can take her medications as usual after the blood has been drawn.  I will call you after we receive her blood test reports and let you know if we need to make any changes in her medication doses.  Otherwise, continue her medications without change for now.  Plan to return for follow up in 6 months or sooner if needed.

## 2012-11-16 NOTE — Telephone Encounter (Signed)
Caregiver reports that pt is now having yellow mucus, tinged with small amount of redness. Advised to bring pt back for recheck - appointment made for 830 in the morning. Wyatt Haste, RN-BSN

## 2012-11-17 ENCOUNTER — Encounter: Payer: Self-pay | Admitting: Family Medicine

## 2012-11-17 ENCOUNTER — Ambulatory Visit (INDEPENDENT_AMBULATORY_CARE_PROVIDER_SITE_OTHER): Payer: Medicaid Other | Admitting: Family Medicine

## 2012-11-17 VITALS — BP 131/87 | HR 76

## 2012-11-17 DIAGNOSIS — J309 Allergic rhinitis, unspecified: Secondary | ICD-10-CM

## 2012-11-17 MED ORDER — FLUTICASONE PROPIONATE 50 MCG/ACT NA SUSP: 2.0000 | Freq: Every day | NASAL | Status: DC

## 2012-11-17 MED ORDER — FEXOFENADINE-PSEUDOEPHED ER 180-240 MG PO TB24: 1.0000 | ORAL_TABLET | Freq: Every day | ORAL | Status: DC

## 2012-11-17 MED ORDER — FEXOFENADINE-PSEUDOEPHED ER 60-120 MG PO TB12: ORAL_TABLET | ORAL | Status: DC

## 2012-11-17 NOTE — Progress Notes (Signed)
Patient ID: STORMY CONNON, female   DOB: 21-Dec-1980, 32 y.o.   MRN: [id number]  Subjective:   CC: Increased nasal mucus with blood  HPI: History provided by mother and another family member.  1. Per mother, pt has h/o allergies and recently switched 7/11 from cetirizine to allegra, which is not working. Last 2 days, she reports yellowed mucus that is stringy and blood-streaked. Also reports increased attempting to clear throat that seems ineffectual. Denies unexplained weight loss, night sweats, or known exposure to TB. Denies fevers, chills, recent seizures, or other obvious pain other than from throat. Cough is very weak at baseline but louder in the last few days. Denies eyes or mouth discharge, diarrhea, or vomiting, or rashes.  Review of Systems - Per HPI.   PMH: - CP with seizures  SH:  - No smoke exposure     Objective:  Physical Exam BP 131/87  Pulse 76  SpO2 96% T probe is malfunctioning  GEN: NAD, in wheelchair HEENT: TMs clear bilaterally, drooling, but o/p clear, sclera clear, no abnormal rhinorrhea PULM: Normal effort, CTAB but unable to get patient to inspire deeply CV: RRR, no m/r/g NEURO: CP, unable to speak with me, grimaces during my exam    Assessment:     RMANI KAPUSTA is a 32 y.o. female with h/o CP, aspiration pneumonia, and allergies here for increased nasal discharge that is now blood-tinged x 2 days.    Plan:     # See problem list for problem-specific plans.

## 2012-11-17 NOTE — Patient Instructions (Addendum)
It was good to see Megan [name] today.  For her congestion, try allegra-D - this has a decongestant in it. Try this for 1 week and then come back if symptoms are not getting better. Also try a bulb suction for mucus, a dehumidifier, and some chest physical therapy. You can try flonase nasal spray once daily, but it may be difficult to administer.  If she gets fevers, chills, rash, or new symptoms, please come in sooner.   Chest Physical Therapy Chest physical therapy (CPT) is a treatment for patients with diseases of the lungs and/or air passages. It is also called chest physiotherapy. This therapy removes the excessive secretions from the lungs by moving the secretions into the upper airways. This enables the secretions to be coughed out. CPT reduces the chances of lung infections and makes breathing more comfortable. It is usually needed for a short period of time. CPT may be needed longer in patients with chronic breathing problems. Chest physical therapy consists of:   Postural drainage.  Chest percussion.  Deep breathing exercises.  Chest vibration.  Assisted coughing. CPT is often done in the morning. This helps to remove the secretions collected during the night.  LET YOUR CAREGIVER KNOW ABOUT:  Intake of food less than an hour ago.  Injuries to the spine and chest wall.  Brittle or broken ribs.  Coughing up blood.  Lung conditions.  High blood pressure.  Heart attack.  Recent surgery.  Open wounds or burns. RISKS AND COMPLICATIONS  Increased pressure inside your head.  Heart problems.  Pain or injury to the ribs.  Heartburn. BEFORE THE PROCEDURE  Your caregiver may do an exam before recommending an effective CPT technique. You or your child may be taught certain positions that are best suited for draining the secretions.  TREATMENTS AND PROCEDURES  Postural drainage. The patient is placed in certain positions where gravity helps to drain the mucus from the lungs  into the bigger airways. This is where it can be easily coughed out.  Chest percussion. This is also called cupping or clapping. It involves rhythmically striking the chest wall with cupped hands. Percussion helps break up thick secretions in the lungs so that those secretions can be more easily removed. It may be done along with postural drainage. The caregiver might use mechanical devices for this procedure. Each area that is to be drained is normally struck for 3-5 minutes.  Chest vibration. This technique uses vibrations produced by the caregiver's hands to break up the secretions of the lung. This procedure may be done along with postural drainage and percussion. It is always done while you are breathing out through your mouth. Chest vibration is repeated several times in a day for 3-4 exhalations.  Controlled or assisted coughing. This method of CPT breaks down the thick mucus secretions and increases the cough pressure to expel the mucus.  Deep breathing exercises. This helps expand the lungs and distribute air to all sections of the respiratory system. AFTER THE PROCEDURE The caregiver may reassess the patient to evaluate the treatment. HOME CARE INSTRUCTIONS  Avoid CPT an hour before or after eating.  Avoid CPT on surgical wounds, bare skin, bony projections, spine, breasts, and breastbone.  Avoid taking deep breaths in quick successions and coughing with your head down during CPT. SEEK MEDICAL CARE IF:   You or your child develops a recurrent cough.  You have a severe headache.  You have vomiting.  You feel disoriented.  You have pain around your  ribs.  You have trouble using any recommended medicines.  You develop unexplained fever more than 100 F (37.8 C). SEEK IMMEDIATE MEDICAL CARE IF:  You develop severe chest pain.  You develop worsening problems with breathing  You faint.  Your child develops high fever more than 102 F (38.9 C). MAKE SURE YOU:    Understand these instructions.  Will watch your condition.  Will get help right away if you are not doing well or get worse. Document Released: 07/21/2007 Document Revised: 07/06/2011 Document Reviewed: 07/21/2007 [facility] Patient Information 2014 [place], [place].

## 2012-11-17 NOTE — Assessment & Plan Note (Signed)
Continued rhinitis today with some blood-streaked mucus, likely from oropharyngeal irritation. No sign/symptom of infection. No improvement on allegra. - Trial allegra-D 1 week, stop mucinex. - Also try humidifier, chest PT, helping with suctioning, nasal saline spray, and flonase (rx'ed, though administration may be difficult - mom verbalized understanding). - Return in 1-2 weeks if no improvement, and can trial montelukast. - Return precautions reviewed.

## 2012-11-18 ENCOUNTER — Telehealth: Payer: Self-pay | Admitting: Pediatrics

## 2012-11-18 ENCOUNTER — Encounter: Payer: Self-pay | Admitting: Family

## 2012-11-18 LAB — CBC WITH DIFFERENTIAL/PLATELET
Basophils Absolute: 0 10*3/uL (ref 0.0–0.1)
HCT: 34.4 % — ABNORMAL LOW (ref 36.0–46.0)
Hemoglobin: 11.7 g/dL — ABNORMAL LOW (ref 12.0–15.0)
Lymphocytes Relative: 35 % (ref 12–46)
Lymphs Abs: 2.3 10*3/uL (ref 0.7–4.0)
MCV: 89.8 fL (ref 78.0–100.0)
Monocytes Absolute: 0.3 10*3/uL (ref 0.1–1.0)
Monocytes Relative: 4 % (ref 3–12)
Neutro Abs: 3.7 10*3/uL (ref 1.7–7.7)
WBC: 6.5 10*3/uL (ref 4.0–10.5)

## 2012-11-18 LAB — PRIMIDONE AND METABOLITE LEVEL
Phenobarbital: 52.2 ug/mL — ABNORMAL HIGH (ref 15.0–40.0)
Primidone, Serum: 5 ug/mL (ref 5–12)

## 2012-11-18 MED ORDER — VALPROATE SODIUM 250 MG/5ML PO SYRP: ORAL_SOLUTION | ORAL | Status: DC

## 2012-11-18 MED ORDER — PHENYTOIN 125 MG/5ML PO SUSP: ORAL | Status: DC

## 2012-11-18 MED ORDER — PRIMIDONE 250 MG PO TABS: ORAL_TABLET | ORAL | Status: DC

## 2012-11-18 NOTE — Telephone Encounter (Signed)
Primidone is 5, phenobarbital 52, valproic acid 77.  I would like to taper primidone over the next couple of weeks.  I would then like to start Onfi at 2.5 mg twice daily.  Valproic acid is probably artificially increasing the phenobarbital level.  It is also possible that the valproic acid level may drop when we discontinue primidone.Give the family a call.  I think it's a good idea to see her in follow up before we start Onfi unless his seizures get out of control.

## 2012-11-18 NOTE — Telephone Encounter (Signed)
I called information and instructions to patient's aunt, Larita Fife. I asked her to stop the morning dose of Primidone and call me in 2 weeks to let me know how she is doing. I also scheduled her for follow up in 4 weeks on August 19 @ 8:45AM.  TG

## 2012-11-18 NOTE — Telephone Encounter (Signed)
Message copied by Deetta Perla on Fri Nov 18, 2012  5:13 PM ------      Message from: Princella Ion      Created: Fri Nov 18, 2012  8:02 AM                   ----- Message -----         From: [facility]         Sent: 11/18/2012   1:37 AM           To: Elveria Rising, NP             ------

## 2012-11-23 ENCOUNTER — Telehealth: Payer: Self-pay | Admitting: Family Medicine

## 2012-11-23 LAB — PHENYTOIN LEVEL, FREE AND TOTAL
Phenytoin Bound: 30.9 mg/L
Phenytoin, Free: 8.5 mg/L — ABNORMAL HIGH (ref 1.0–2.0)
Phenytoin, Total: 39.4 mg/L — ABNORMAL HIGH (ref 10.0–20.0)

## 2012-11-23 NOTE — Telephone Encounter (Signed)
I received fax from pharmacy that allegra D cannot be crushed and put into feeding tube. I think this is because it is a 24 hour formulation. Please tell her to try her previous allergra and get "OTC sudafed childrens" and crush that. If this does not work, come back in for re-evaluation and consideration of montelukast.  Thanks.  Leona Singleton, MD

## 2012-11-24 NOTE — Telephone Encounter (Signed)
Caregiver verbalized understanding.  Hughie Melroy, Darlyne Russian, CMA

## 2012-11-25 ENCOUNTER — Telehealth: Payer: Self-pay | Admitting: Pediatrics

## 2012-11-25 ENCOUNTER — Other Ambulatory Visit: Payer: Self-pay | Admitting: Family

## 2012-11-25 DIAGNOSIS — T420X1A Poisoning by hydantoin derivatives, accidental (unintentional), initial encounter: Secondary | ICD-10-CM

## 2012-11-25 DIAGNOSIS — G40309 Generalized idiopathic epilepsy and epileptic syndromes, not intractable, without status epilepticus: Secondary | ICD-10-CM

## 2012-11-25 HISTORY — DX: Poisoning by hydantoin derivatives, accidental (unintentional), initial encounter: T42.0X1A

## 2012-11-25 NOTE — Telephone Encounter (Signed)
Message copied by Deetta Perla on Fri Nov 25, 2012  1:41 PM ------      Message from: Princella Ion      Created: Fri Nov 25, 2012  8:45 AM       Look at her Dilantin level. I didn't see this until last night.       Inetta Fermo      ----- Message -----         From: [facility]         Sent: 11/18/2012   1:37 AM           To: Elveria Rising, NP                   ------

## 2012-11-25 NOTE — Telephone Encounter (Deleted)
Message copied by Deetta Perla on Fri Nov 25, 2012  1:42 PM ------      Message from: Princella Ion      Created: Fri Nov 25, 2012  8:45 AM       Look at her Dilantin level. I didn't see this until last night.       Inetta Fermo      ----- Message -----         From: [facility]         Sent: 11/18/2012   1:37 AM           To: Elveria Rising, NP                   ------

## 2012-11-29 ENCOUNTER — Other Ambulatory Visit: Payer: Self-pay | Admitting: Family

## 2012-11-29 DIAGNOSIS — G40319 Generalized idiopathic epilepsy and epileptic syndromes, intractable, without status epilepticus: Secondary | ICD-10-CM

## 2012-11-29 DIAGNOSIS — Z79899 Other long term (current) drug therapy: Secondary | ICD-10-CM

## 2012-11-29 LAB — PHENYTOIN LEVEL, TOTAL: Phenytoin Lvl: 63.4 ug/mL — ABNORMAL HIGH (ref 10.0–20.0)

## 2012-12-02 ENCOUNTER — Telehealth: Payer: Self-pay | Admitting: Family

## 2012-12-02 NOTE — Telephone Encounter (Signed)
[name]'s Dilantin level on 11/29/12 was 63.4 mcg/ml. I had planned with her family for her to have Dilantin level rechecked on 12/01/12. When I had not received lab results this afternoon, I called her family to check on her condition and to see if she had gone for lab drawn. I spoke with her Monico Blitz, who said that [facility] mother had refused to allow her to go for blood draw, saying that she did not want her to be "stuck" twice in one week. I explained that we were concerned about the high Dilantin level and that it was important that she go for repeat Dilantin level. Her aunt said that her mother planned to take her on 12/05/12. She said that [name] was doing well, more alert, no seizures. I again stressed that it was important for her to go for lab draw. TG

## 2012-12-02 NOTE — Telephone Encounter (Signed)
I agree with your reasoning, that there is nothing that we can do.  Hopefully she won't have seizures.  I would not place her on Dilantin until we know what the drug level is.  I understand mother's concern about having blood drawn twice a week.

## 2012-12-07 ENCOUNTER — Other Ambulatory Visit: Payer: Self-pay | Admitting: Family

## 2012-12-07 DIAGNOSIS — Z79899 Other long term (current) drug therapy: Secondary | ICD-10-CM

## 2012-12-07 DIAGNOSIS — G40319 Generalized idiopathic epilepsy and epileptic syndromes, intractable, without status epilepticus: Secondary | ICD-10-CM

## 2012-12-08 NOTE — Progress Notes (Signed)
Noted  

## 2012-12-12 ENCOUNTER — Ambulatory Visit: Payer: Medicaid Other | Admitting: Family Medicine

## 2012-12-13 ENCOUNTER — Encounter: Payer: Self-pay | Admitting: Family

## 2012-12-13 ENCOUNTER — Ambulatory Visit (INDEPENDENT_AMBULATORY_CARE_PROVIDER_SITE_OTHER): Payer: Medicaid Other | Admitting: Family

## 2012-12-13 VITALS — BP 106/74 | HR 78

## 2012-12-13 DIAGNOSIS — G40319 Generalized idiopathic epilepsy and epileptic syndromes, intractable, without status epilepticus: Secondary | ICD-10-CM

## 2012-12-13 DIAGNOSIS — Z79899 Other long term (current) drug therapy: Secondary | ICD-10-CM

## 2012-12-13 DIAGNOSIS — R569 Unspecified convulsions: Secondary | ICD-10-CM

## 2012-12-13 DIAGNOSIS — G809 Cerebral palsy, unspecified: Secondary | ICD-10-CM

## 2012-12-13 DIAGNOSIS — T420X1A Poisoning by hydantoin derivatives, accidental (unintentional), initial encounter: Secondary | ICD-10-CM | POA: Insufficient documentation

## 2012-12-13 DIAGNOSIS — K117 Disturbances of salivary secretion: Secondary | ICD-10-CM

## 2012-12-13 NOTE — Patient Instructions (Signed)
Continue to give all her medications except Dilantin. I will tell you when to restart it.  Please take Megan [name] to have her Dilantin level checked today. The order is at the [facility] at [employer]. I will call you when I receive the lab results.  Let me know if Megan [name] has increase in seizure activity or becomes unusually sleepy again.  Plan to return for follow up in 4 months or sooner if needed.

## 2012-12-13 NOTE — Progress Notes (Signed)
Patient: Megan [name] MRN: [id number] Sex: female DOB: [birth year]  Provider: Elveria Rising, NP Location of Care: [facility]  Note type: Routine return visit  History of Present Illness: Referral Source: [facility] History from: family member Chief Complaint: Re-check elevated medication levels  Megan [name] is a 32 y.o. female with history of intractable minor motor,and generalized tonic-clonic seizure activity, profound mental retardation, autistic behaviors,dysphagia with PEG tube in place, and cortical blindness. She was on Coumadin briefly in 2009. She requires total care for all activities of daily living. She has had no tonic-clonic seizures in over 3 years according to her family.  She returns today in follow up from 1 month ago when she was last seen. At that time, she was having twitches in her face and I requested that she had her antiepileptic medication levels checked. Her Dilantin levels were markedly elevated at 63.4 mcg/ml and we have been working since then to get them lowered. The most recent level was 36.2 on December 05, 2012. Her family has been reluctant to take her for frequent lab draws. The family member with her today says that she will take her today or tomorrow to have her blood drawn. [name] is more alert since last seen. She has not had any seizures.    Review of Systems: 12 system review was remarkable for cough and wheezing  Past Medical History  Diagnosis Date  . Cerebral palsy, quadriplegic   . Cortical blindness   . Seizures   . Mental retardation   . Cerebral palsy   . Hypertension 06/23/2010  . Vision abnormalities   . Allergic rhinitis 11/04/2012   Hospitalizations: yes, Head Injury: no, Nervous System Infections: no, Immunizations up to date: yes Past Medical History Comments: See surgical Hx for hospitalizations.   Surgical History Past Surgical History  Procedure Laterality Date  . Peg tube placement    . Peg  placement  09/03/2011    Procedure: PERCUTANEOUS ENDOSCOPIC GASTROSTOMY (PEG) REPLACEMENT;  Surgeon: Beverley Fiedler, MD;  Location: [facility];  Service: Gastroenterology;  Laterality: N/A;  . Peg placement  02/19/2012    Procedure: PERCUTANEOUS ENDOSCOPIC GASTROSTOMY (PEG) REPLACEMENT;  Surgeon: Hart Carwin, MD;  Location: [facility];  Service: Endoscopy;  Laterality: N/A;  needs gastrostomy button 24 Fr 4.4 and 3.5 available   Surgeries: yes Surgical History Comments: See Hx  Family History family history includes Cancer in her maternal grandfather; Diabetes in her maternal aunt, maternal grandmother, and mother; Hypertension in her other; Stroke in her maternal grandmother. Family History is negative migraines, seizures, cognitive impairment, blindness, deafness, birth defects, chromosomal disorder, autism.  Social History History   Social History  . Marital Status: Single    Spouse Name: N/A    Number of Children: N/A  . Years of Education: N/A   Social History Main Topics  . Smoking status: Never Smoker   . Smokeless tobacco: Never Used  . Alcohol Use: No  . Drug Use: No  . Sexual Activity: No   Other Topics Concern  . None   Social History Narrative  . None    Current Outpatient Prescriptions on File Prior to Visit  Medication Sig Dispense Refill  . levothyroxine (SYNTHROID, LEVOTHROID) 88 MCG tablet Take 88 mcg by mouth daily.      . Nutritional Supplements (JEVITY) LIQD Take 1 Can by mouth 4 (four) times daily.      . phenytoin (DILANTIN) 125 MG/5ML suspension 4 ml via g-tube  three times a day  408 mL  11  . primidone (MYSOLINE) 250 MG tablet Give 1/2 tablet crushed via peg tube at night      . ranitidine (ZANTAC) 75 MG/5ML syrup 5 mLs (75 mg total) by Per J Tube route 2 (two) times daily.  480 mL  5  . valproate (DEPAKENE) 250 MG/5ML syrup Give 13 ml by peg tube every 6 hours  1768 mL  11  . fexofenadine-pseudoephedrine (ALLEGRA-D ALLERGY & CONGESTION) 60-120 MG  per tablet Crush and put 1 tablet per tube 1-2 times daily for 1 week.  30 tablet  0  . fluticasone (FLONASE) 50 MCG/ACT nasal spray Place 2 sprays into the nose daily.  16 g  6   No current facility-administered medications on file prior to visit.   The medication list was reviewed and reconciled. All changes or newly prescribed medications were explained.  A complete medication list was provided to the patient/caregiver.  Allergies  Allergen Reactions  . Doxycycline     REACTION: Blisters / swelling    Physical Exam BP 106/74  Pulse 78 She is in a wheelchair and we are unable to obtain her height and weight. General: Well developed young woman, seated in wheelchair, more alert and awake then when last seen in July 2014 Head: Normocephalic and atraumatic  Ears, Nose and Throat: Tympanic membranes partially occluded by cerumen bilaterally. Unable to fully examine her posterior pharynx due to patient's inability to cooperate. Her tongue protrudes.  Neck: supple, unable to maintain head control  Respiratory: lungs clear to auscultation  Cardiovascular: regular rate and rhythm. No murmurs  Musculoskeletal: contractures of extremities at the knees and elbows. she has hypotonia in her trunk  Skin: She has a dressing on her right elbow that I did not remove.  Trunk: PEG tube intact   Neurologic Exam  Mental Status: Awake but drowsy at times. Evidence of profound mental retardation. She has no speech. She smiles on occasion.  Cranial Nerves: Fundoscopic exam reveals poorly visualized disc margins. Pupils equal, briskly reactive to light. Unable to evaluate EOM's due to her inability to follow instructions. She has dysconjugate eye movements. I cannot get her to fix and follow on objects in her visual field. She turns to localize sounds but does not do so consistently. She has lower facial weakness with drooling. Her tongue protrudes at all times. Unable to adequately evaluate neck flexion and  extension.  Motor: She has spastic quadriplegia  Sensory: Withdraws to noxious stimuli  Coordination: Cannot cooperate with exam  Gait and Station: wheelchair bound, does not ambulate  Reflexes: diminished throughout   Assessment and Plan [name] is a 32 year old woman with history of intractable minor motor,and generalized tonic-clonic seizure activity, profound mental retardation, autistic behaviors,dysphagia with PEG tube in place, and cortical blindness. She has had recent Dilantin toxicity with levels as high as 63.4 mcg/ml in late July, 2014. The most recent level was 36.2 mcg/ml on December 05, 2012. I talked with her family member today about the importance of rechecking her Dilantin level today and it is my hope that she will take her to the lab to do so. I will call her family when I receive a lab report and give them instructions regarding the Dilantin. For now, the Dilantin dose remains on hold.

## 2012-12-27 ENCOUNTER — Other Ambulatory Visit: Payer: Self-pay | Admitting: *Deleted

## 2012-12-28 ENCOUNTER — Emergency Department (HOSPITAL_COMMUNITY)
Admission: EM | Admit: 2012-12-28 | Discharge: 2012-12-28 | Disposition: A | Discharge status: Home / Self Care | Payer: Medicaid Other | Attending: Emergency Medicine | Admitting: Emergency Medicine

## 2012-12-28 ENCOUNTER — Encounter (HOSPITAL_COMMUNITY): Payer: Self-pay

## 2012-12-28 ENCOUNTER — Emergency Department (HOSPITAL_COMMUNITY): Payer: Medicaid Other

## 2012-12-28 DIAGNOSIS — IMO0002 Reserved for concepts with insufficient information to code with codable children: Secondary | ICD-10-CM | POA: Insufficient documentation

## 2012-12-28 DIAGNOSIS — E860 Dehydration: Secondary | ICD-10-CM

## 2012-12-28 DIAGNOSIS — I10 Essential (primary) hypertension: Secondary | ICD-10-CM | POA: Insufficient documentation

## 2012-12-28 DIAGNOSIS — Z79899 Other long term (current) drug therapy: Secondary | ICD-10-CM | POA: Insufficient documentation

## 2012-12-28 DIAGNOSIS — R5381 Other malaise: Secondary | ICD-10-CM | POA: Insufficient documentation

## 2012-12-28 DIAGNOSIS — R7889 Finding of other specified substances, not normally found in blood: Secondary | ICD-10-CM

## 2012-12-28 DIAGNOSIS — Z8659 Personal history of other mental and behavioral disorders: Secondary | ICD-10-CM | POA: Insufficient documentation

## 2012-12-28 DIAGNOSIS — G40802 Other epilepsy, not intractable, without status epilepticus: Secondary | ICD-10-CM | POA: Insufficient documentation

## 2012-12-28 DIAGNOSIS — H47619 Cortical blindness, unspecified side of brain: Secondary | ICD-10-CM | POA: Insufficient documentation

## 2012-12-28 DIAGNOSIS — G40909 Epilepsy, unspecified, not intractable, without status epilepticus: Secondary | ICD-10-CM

## 2012-12-28 DIAGNOSIS — Z8709 Personal history of other diseases of the respiratory system: Secondary | ICD-10-CM | POA: Insufficient documentation

## 2012-12-28 DIAGNOSIS — R892 Abnormal level of other drugs, medicaments and biological substances in specimens from other organs, systems and tissues: Secondary | ICD-10-CM | POA: Insufficient documentation

## 2012-12-28 DIAGNOSIS — R0789 Other chest pain: Secondary | ICD-10-CM | POA: Insufficient documentation

## 2012-12-28 LAB — BASIC METABOLIC PANEL
Calcium: 9.6 mg/dL (ref 8.4–10.5)
GFR calc Af Amer: >90 mL/min (ref >90)
GFR calc non Af Amer: >90 mL/min (ref >90)
Glucose, Bld: 84 mg/dL (ref 70–99)
Potassium: 3.7 mEq/L (ref 3.5–5.1)
Sodium: 139 mEq/L (ref 135–145)

## 2012-12-28 LAB — URINALYSIS, ROUTINE W REFLEX MICROSCOPIC
Hgb urine dipstick: NEGATIVE
Nitrite: NEGATIVE
Protein, ur: 30 mg/dL — AB
Specific Gravity, Urine: 1.036 — ABNORMAL HIGH (ref 1.005–1.030)
Urobilinogen, UA: 1 mg/dL (ref 0.0–1.0)

## 2012-12-28 LAB — URINE MICROSCOPIC-ADD ON

## 2012-12-28 LAB — CBC WITH DIFFERENTIAL/PLATELET
Basophils Absolute: 0 10*3/uL (ref 0.0–0.1)
Basophils Relative: 0 % (ref 0–1)
Eosinophils Absolute: 0.1 10*3/uL (ref 0.0–0.7)
Hemoglobin: 12.8 g/dL (ref 12.0–15.0)
MCH: 31.2 pg (ref 26.0–34.0)
MCHC: 31.5 g/dL (ref 30.0–36.0)
Monocytes Relative: 11 % (ref 3–12)
Neutro Abs: 3.2 10*3/uL (ref 1.7–7.7)
Neutrophils Relative %: 57 % (ref 43–77)
Platelets: 266 10*3/uL (ref 150–400)

## 2012-12-28 LAB — PHENYTOIN LEVEL, TOTAL: Phenytoin Lvl: <2.5 ug/mL — ABNORMAL LOW (ref 10.0–20.0)

## 2012-12-28 MED ORDER — RANITIDINE HCL 75 MG/5ML PO SYRP: 75.0000 mg | ORAL_SOLUTION | Freq: Two times a day (BID) | ORAL | Status: DC

## 2012-12-28 MED ORDER — PHENYTOIN SODIUM 50 MG/ML IJ SOLN
1000.0000 mg | Freq: Once | INTRAMUSCULAR | Status: AC
  Administered 2012-12-28: 1000 mg via INTRAVENOUS
  Filled 2012-12-28: qty 20

## 2012-12-28 MED ORDER — LORAZEPAM 2 MG/ML IJ SOLN
1.0000 mg | Freq: Once | INTRAMUSCULAR | Status: AC
  Administered 2012-12-28: 1 mg via INTRAVENOUS
  Filled 2012-12-28: qty 1

## 2012-12-28 MED ORDER — SODIUM CHLORIDE 0.9 % IV BOLUS (SEPSIS)
1000.0000 mL | Freq: Once | INTRAVENOUS | Status: AC
  Administered 2012-12-28: 1000 mL via INTRAVENOUS

## 2012-12-28 NOTE — ED Provider Notes (Signed)
CSN: [id number]     Arrival date & time 12/28/12  1339 History   First MD Initiated Contact with Patient 12/28/12 1410     Chief Complaint  Patient presents with  . Seizures   (Consider location/radiation/quality/duration/timing/severity/associated sxs/prior Treatment) HPI Comments: 32  Yo female with CP, blind, seizures, htn presents with recurrent seizures today.  Timing variable, pt has had multiple lasting minutes at t time, some times left sided, other times generalized.  Similar to previous but pt has been doing well for 2 yrs.  Recently held dilantin for 2 wks bc level high.  Pt on depakote for which she takes.  No recent infections.  Pt non verbal with cp.  No head injuries.  Neuro Dr Blane Ohara.  Patient is a 32 y.o. female presenting with seizures. The history is provided by a relative and a parent.  Seizures Seizure activity on arrival: yes   Seizure type:  Focal   Past Medical History  Diagnosis Date  . Cerebral palsy, quadriplegic   . Cortical blindness   . Seizures   . Mental retardation   . Cerebral palsy   . Hypertension 06/23/2010  . Vision abnormalities   . Allergic rhinitis 11/04/2012   Past Surgical History  Procedure Laterality Date  . Peg tube placement    . Peg placement  09/03/2011    Procedure: PERCUTANEOUS ENDOSCOPIC GASTROSTOMY (PEG) REPLACEMENT;  Surgeon: Beverley Fiedler, MD;  Location: [facility];  Service: Gastroenterology;  Laterality: N/A;  . Peg placement  02/19/2012    Procedure: PERCUTANEOUS ENDOSCOPIC GASTROSTOMY (PEG) REPLACEMENT;  Surgeon: Hart Carwin, MD;  Location: [facility];  Service: Endoscopy;  Laterality: N/A;  needs gastrostomy button 24 Fr 4.4 and 3.5 available   Family History  Problem Relation Age of Onset  . Diabetes Mother   . Diabetes Maternal Grandmother   . Stroke Maternal Grandmother   . Diabetes Maternal Aunt   . Hypertension Other     Fhx  . Cancer Maternal Grandfather     Died at 84   History  Substance Use Topics   . Smoking status: Never Smoker   . Smokeless tobacco: Never Used  . Alcohol Use: No   OB History   Grav Para Term Preterm Abortions TAB SAB Ect Mult Living                 Review of Systems  Unable to perform ROS: Patient nonverbal  Neurological: Positive for seizures.    Allergies  Doxycycline  Home Medications   Current Outpatient Rx  Name  Route  Sig  Dispense  Refill  . fexofenadine-pseudoephedrine (ALLEGRA-D ALLERGY & CONGESTION) 60-120 MG per tablet      Crush and put 1 tablet per tube 1-2 times daily for 1 week.   30 tablet   0   . fluticasone (FLONASE) 50 MCG/ACT nasal spray   Nasal   Place 2 sprays into the nose daily.   16 g   6   . levothyroxine (SYNTHROID, LEVOTHROID) 88 MCG tablet   Oral   Take 88 mcg by mouth daily.         . Nutritional Supplements (JEVITY) LIQD   Oral   Take 1 Can by mouth 4 (four) times daily.         . phenytoin (DILANTIN) 125 MG/5ML suspension      4 ml via g-tube three times a day   408 mL   11   . primidone (MYSOLINE) 250 MG tablet  Give 1/2 tablet crushed via peg tube at night         . ranitidine (ZANTAC) 75 MG/5ML syrup   Per J Tube   5 mLs (75 mg total) by Per J Tube route 2 (two) times daily.   480 mL   5   . valproate (DEPAKENE) 250 MG/5ML syrup      Give 13 ml by peg tube every 6 hours   1768 mL   11    BP 113/78  Pulse 95  Temp(Src) 96.8 F (36 C) (Rectal)  Resp 16  SpO2 100% Physical Exam  Nursing note and vitals reviewed. Constitutional: She appears well-developed. She appears lethargic. No distress.  HENT:  Head: Atraumatic.  Dry mm congested Mild tongue protrusion  Eyes: Conjunctivae are normal. Pupils are equal, round, and reactive to light.  Neck: Normal range of motion. Neck supple. No JVD present.  Cardiovascular: Normal rate and regular rhythm.   Pulmonary/Chest: No respiratory distress. She has no wheezes. She has no rales.  Abdominal: Soft. She exhibits no distension.  There is no tenderness.  Neurological: She appears lethargic.  Non verbal Difficult neuro exam No spont movement of extremities  Pt does not  Follow commands Mild lethargy Responds to pain  Skin: Skin is warm.    ED Course  Procedures (including critical care time) Labs Review Labs Reviewed  CBC WITH DIFFERENTIAL - Abnormal; Notable for the following:    RDW 20.2 (*)    All other components within normal limits  BASIC METABOLIC PANEL  URINALYSIS, ROUTINE W REFLEX MICROSCOPIC  PHENYTOIN LEVEL, TOTAL   Imaging Review No results found.  MDM  No diagnosis found. Break through seizures due to low dilantin. Pt had two brief seizures in ED, ativan given, improved. Dilantin low.  Bolus/load ordered. With multiple seizures plan for loading and observation overnight. Paged neuro. [place] O2 in place.  Basic Labs and xray no acute findings, reviewed. Dg Chest 2 View  12/28/2012   *RADIOLOGY REPORT*  Clinical Data: Seizure activity  CHEST - 2 VIEW  Comparison: 07/21/2012  Findings: The cardiac shadow is stable.  The lungs are clear bilaterally.  No acute bony abnormality is seen.  IMPRESSION: No acute abnormality noted.   Original Report Authenticated By: Alcide Clever, M.D.   Multiple seizures, low dilantin, Lethargy  Enid Skeens, MD 12/28/12 [phone]

## 2012-12-28 NOTE — ED Provider Notes (Signed)
Discussed with Dr Sharene Skeans. Recommends continuing dilantin at 100mg  (125mg /14ml) three times per day. Discussed with pt's aunt who sounds reports she is one of primary care takers. Dilantin administered after being measured "in a cup" which sounds like small graduated medicine cup per description.  Some question by Dr Sharene Skeans as to if precise dosing being administered. Provided with syringes and instructed that needs to use them to draw up the 4ml. Prescription provided for repeat dilantin level to be drawn on Monday and results to be forwarded to Dr Sharene Skeans.  Verbally discussed and also provided written directions.   Megan Razor, MD 12/28/12 [phone]

## 2012-12-28 NOTE — ED Notes (Signed)
She is returned from x-ray at this time.  She is drowsy and in no distress.

## 2012-12-28 NOTE — ED Notes (Signed)
Caregiver at her [facility] needs [facility] noted pt. To have a left-sided tonic/clonic seizure.  Her family stated to EMS that she hadn't had a seizure in ~ 2 years.  She is somnolent and in no distress.  She is normally (at her baseline) non-verbal.

## 2013-02-17 ENCOUNTER — Telehealth: Payer: Self-pay | Admitting: Internal Medicine

## 2013-02-17 ENCOUNTER — Emergency Department (HOSPITAL_COMMUNITY)
Admission: EM | Admit: 2013-02-17 | Discharge: 2013-02-17 | Disposition: A | Discharge status: Home / Self Care | Payer: Medicaid Other | Attending: Emergency Medicine | Admitting: Emergency Medicine

## 2013-02-17 ENCOUNTER — Encounter: Payer: Self-pay | Admitting: Family Medicine

## 2013-02-17 ENCOUNTER — Encounter (HOSPITAL_COMMUNITY): Payer: Self-pay | Admitting: Emergency Medicine

## 2013-02-17 ENCOUNTER — Ambulatory Visit (INDEPENDENT_AMBULATORY_CARE_PROVIDER_SITE_OTHER): Payer: Medicaid Other | Admitting: Family Medicine

## 2013-02-17 VITALS — BP 118/86 | HR 84

## 2013-02-17 DIAGNOSIS — I509 Heart failure, unspecified: Secondary | ICD-10-CM | POA: Insufficient documentation

## 2013-02-17 DIAGNOSIS — J309 Allergic rhinitis, unspecified: Secondary | ICD-10-CM

## 2013-02-17 DIAGNOSIS — J4489 Other specified chronic obstructive pulmonary disease: Secondary | ICD-10-CM | POA: Insufficient documentation

## 2013-02-17 DIAGNOSIS — Z79899 Other long term (current) drug therapy: Secondary | ICD-10-CM | POA: Insufficient documentation

## 2013-02-17 DIAGNOSIS — I10 Essential (primary) hypertension: Secondary | ICD-10-CM | POA: Insufficient documentation

## 2013-02-17 DIAGNOSIS — G809 Cerebral palsy, unspecified: Secondary | ICD-10-CM

## 2013-02-17 DIAGNOSIS — Y833 Surgical operation with formation of external stoma as the cause of abnormal reaction of the patient, or of later complication, without mention of misadventure at the time of the procedure: Secondary | ICD-10-CM | POA: Insufficient documentation

## 2013-02-17 DIAGNOSIS — Z0000 Encounter for general adult medical examination without abnormal findings: Secondary | ICD-10-CM

## 2013-02-17 DIAGNOSIS — K9423 Gastrostomy malfunction: Secondary | ICD-10-CM | POA: Insufficient documentation

## 2013-02-17 DIAGNOSIS — Z23 Encounter for immunization: Secondary | ICD-10-CM

## 2013-02-17 DIAGNOSIS — IMO0002 Reserved for concepts with insufficient information to code with codable children: Secondary | ICD-10-CM | POA: Insufficient documentation

## 2013-02-17 DIAGNOSIS — J449 Chronic obstructive pulmonary disease, unspecified: Secondary | ICD-10-CM | POA: Insufficient documentation

## 2013-02-17 DIAGNOSIS — G40909 Epilepsy, unspecified, not intractable, without status epilepticus: Secondary | ICD-10-CM | POA: Insufficient documentation

## 2013-02-17 DIAGNOSIS — G808 Other cerebral palsy: Secondary | ICD-10-CM

## 2013-02-17 HISTORY — DX: Chronic obstructive pulmonary disease, unspecified: J44.9

## 2013-02-17 HISTORY — DX: Heart failure, unspecified: I50.9

## 2013-02-17 MED ORDER — LORATADINE 5 MG/5ML PO SYRP: 10.0000 mg | ORAL_SOLUTION | Freq: Every day | ORAL | Status: DC

## 2013-02-17 MED ORDER — JEVITY PO LIQD: 1.0000 | Freq: Three times a day (TID) | ORAL | Status: DC

## 2013-02-17 NOTE — Patient Instructions (Signed)
Thank you for coming in today. For her cough, I have prescribed Arnecia Claritin to be given daily through her g-tube. If the cough gets worse or she develops a fever, please bring her back to be re-evaluated as this could be a sign of developing pneumonia.  I will have her physical form ready to pick up some time next week. Someone from our office will call you when it's ready.  Thanks!  Dr. Richarda Blade

## 2013-02-17 NOTE — Assessment & Plan Note (Addendum)
Persistent cough and rhinorrhea without fevers or other concerning symptoms. Caregivers reporting better results with claritin which she has not been on lately - sent rx for children's claritin - continue chest PT, suction, flonase - return for fever or worsening of cough

## 2013-02-17 NOTE — ED Notes (Signed)
Patient placed in gown, call bell in reach of family. Support persons at bedside.

## 2013-02-17 NOTE — ED Provider Notes (Addendum)
CSN: [id number]     Arrival date & time 02/17/13  1119 History   First MD Initiated Contact with Patient 02/17/13 1125     Chief Complaint  Patient presents with  . replace "mickey" button     sent by Dr Dickie La    (Consider location/radiation/quality/duration/timing/severity/associated sxs/prior Treatment) HPI  Patient here with family. Has a history of profound cerebral palsy. Does not take medications her nutrition by mouth. Is dependent on a G-tube. The G-tube button "broke" this morning. Has not dislodged. Her mother whose caregiver states that she could not give [place] anything through it. She has not missed medications. She's not been without feedings.   Past Medical History  Diagnosis Date  . Cerebral palsy, quadriplegic   . Cortical blindness   . Seizures   . Mental retardation   . Cerebral palsy   . Hypertension 06/23/2010  . Vision abnormalities   . Allergic rhinitis 11/04/2012  . CHF (congestive heart failure)   . COPD (chronic obstructive pulmonary disease)    Past Surgical History  Procedure Laterality Date  . Peg tube placement    . Peg placement  09/03/2011    Procedure: PERCUTANEOUS ENDOSCOPIC GASTROSTOMY (PEG) REPLACEMENT;  Surgeon: Beverley Fiedler, MD;  Location: [facility];  Service: Gastroenterology;  Laterality: N/A;  . Peg placement  02/19/2012    Procedure: PERCUTANEOUS ENDOSCOPIC GASTROSTOMY (PEG) REPLACEMENT;  Surgeon: Hart Carwin, MD;  Location: [facility];  Service: Endoscopy;  Laterality: N/A;  needs gastrostomy button 24 Fr 4.4 and 3.5 available   Family History  Problem Relation Age of Onset  . Diabetes Mother   . Diabetes Maternal Grandmother   . Stroke Maternal Grandmother   . Diabetes Maternal Aunt   . Hypertension Other     Fhx  . Cancer Maternal Grandfather     Died at 26   History  Substance Use Topics  . Smoking status: Never Smoker   . Smokeless tobacco: Never Used  . Alcohol Use: No   OB History   Grav Para Term Preterm Abortions  TAB SAB Ect Mult Living                 Review of Systems Level V out of review of systems because of cerebral palsy. Allergies  Doxycycline  Home Medications   Current Outpatient Rx  Name  Route  Sig  Dispense  Refill  . fluticasone (FLONASE) 50 MCG/ACT nasal spray   Nasal   Place 2 sprays into the nose daily.         Marland Kitchen levothyroxine (SYNTHROID, LEVOTHROID) 88 MCG tablet   Oral   Take 88 mcg by mouth daily.         . Nutritional Supplements (JEVITY) LIQD   Oral   Take 1 Can by mouth 4 (four) times daily.         . phenytoin (DILANTIN) 125 MG/5ML suspension   Oral   Take 125 mg by mouth 3 (three) times daily. 4 ml via g-tube three times a day         . primidone (MYSOLINE) 250 MG tablet   Oral   Take 125 mg by mouth 2 (two) times daily. Give 1/2 tablet crushed via peg tube at night         . ranitidine (ZANTAC) 75 MG/5ML syrup   Per J Tube   5 mLs (75 mg total) by Per J Tube route 2 (two) times daily.   480 mL   5   .  valproate (DEPAKENE) 250 MG/5ML syrup   Oral   Take 650 mg by mouth 2 (two) times daily. Give 13 ml by peg tube every 6 hours          BP 146/86  Pulse 69  Resp 16  Wt 95 lb (43.092 kg)  SpO2 100% Physical Exam  Constitutional:  Non-toxic appearance. She does not have a sickly appearance.  HENT:  Head: Normocephalic.  Treatment. Moist mucous membranes.  Eyes: Conjunctivae are normal. Pupils are equal, round, and reactive to light. No scleral icterus.  Jugular not pale  Neck: Normal range of motion. Neck supple. No thyromegaly present.  Cardiovascular: Normal rate and regular rhythm.  Exam reveals no gallop and no friction rub.   No murmur heard. Pulmonary/Chest: Effort normal and breath sounds normal. No respiratory distress. She has no wheezes. She has no rales.  Abdominal: Soft. Bowel sounds are normal. She exhibits no distension. There is no tenderness. There is no rebound.    Musculoskeletal: Normal range of motion.  Skin:  Skin is warm and dry. No rash noted.    ED Course  Gastrostomy tube replacement Date/Time: 02/17/2013 12:06 PM Performed by: Roney Marion Authorized by: Rolland Porter J Consent given by: guardian (Grandmother who is here with her) Patient understanding: patient does not state understanding of the procedure being performed Patient identity confirmed: arm band Local anesthesia used: no Patient sedated: no Patient tolerance: Patient tolerated the procedure well with no immediate complications. Comments: OG tube removed without difficulty. No bleeding noted. The mature site is noted. I directly size G-tube placed without difficulty. FreeFlow aspiration of gastric contents. Flushes without resistance. Balloon inflated with 4 mL sterile saline. No complications   (including critical care time) Labs Review Labs Reviewed - No data to display Imaging Review No results found.  EKG Interpretation   None       MDM   1. Dislodged gastrostomy tube    G-tube was replaced without difficulty. Was discharged home.    Roney Marion, MD 02/17/13 1610  Roney Marion, MD 02/17/13 [phone]

## 2013-02-17 NOTE — Assessment & Plan Note (Signed)
Caregivers requesting decrease in feed prescription as they have only been giving 3 cans daily and the cases are piling up. Megan [name] vomits with 4 cans and does not appear hungry with the 3 they have been giving. 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

## 2013-02-17 NOTE — Progress Notes (Signed)
  Subjective:    Patient ID: Megan [name], female    DOB: 01/30/1981, 32 y.o.   MRN: [id number]  Cough   Infant is a 32yo with MRCP who presents with her caregivers for f/u of allergic rhinitis and cough. History provided by aunt and cousin. Megan [name]'s cough had improved somewhat with flonase but has worsened over the past few weeks. It is productive of whitish/clear phlegm. They deny fevers, behavior changes, signs of pain. She has been eating and sleeping like normal. She has had a lot of nasal discharge over the same period. She has not appeared to be working hard to breath.  The primary reason for their visit was to complete a physical form for her school which I will scan in to her chart when complete.   Review of Systems  Respiratory: Positive for cough.    See HPI    Objective:   Physical Exam  Constitutional: She appears well-developed and well-nourished. No distress.  HENT:  Head: Normocephalic and atraumatic.  Right Ear: External ear normal.  Left Ear: External ear normal.  Nose: Nose normal.  Mouth/Throat: Oropharynx is clear and moist. No oropharyngeal exudate.  Eyes: Conjunctivae are normal. Right eye exhibits no discharge. Left eye exhibits no discharge. No scleral icterus.  Neck: Normal range of motion.  Cardiovascular: Normal rate, regular rhythm, normal heart sounds and intact distal pulses.   No murmur heard. Pulmonary/Chest: Effort normal and breath sounds normal. No respiratory distress. She has no wheezes.  Abdominal: Soft. Bowel sounds are normal. She exhibits no distension and no mass. There is no tenderness.  Lymphadenopathy:    She has no cervical adenopathy.  Neurological: She displays abnormal reflex. She exhibits abnormal muscle tone.  Wheelchair bound, nonverbal, unresponsive to most stimuli, hypotonic throughout, somewhat spastic upper extremities on exam  Skin: Skin is warm and dry. No rash noted. She is not diaphoretic.          Assessment & Plan:   See problem list

## 2013-02-17 NOTE — Telephone Encounter (Signed)
Patient has button feeding tube for medications and feedings. The button has a piece that broke off and the tube is unable to be used because it just leaks back. Spoke with Dr. Juanda Chance and patient needs to go to ED for evaluation/replacement of PEG. Caregiver notified.

## 2013-02-17 NOTE — ED Notes (Signed)
Patient family states they were sent by Dr Dickie La to have patient's Megan [name] replaced. Family states [name] broke this morning.

## 2013-02-17 NOTE — Assessment & Plan Note (Signed)
Flu shot given today

## 2013-03-03 ENCOUNTER — Telehealth: Payer: Self-pay

## 2013-03-03 NOTE — Telephone Encounter (Signed)
Guardian of patient calls stating that a form for continued participation on after gateway program was brought to the [facility] on 10/24. She states form was not returned. Dr. Richarda Blade still has paperwork, will completed soon. Please call guardian once MD has completed.  [phone]

## 2013-03-09 ENCOUNTER — Telehealth: Payer: Self-pay | Admitting: Family Medicine

## 2013-03-09 NOTE — Telephone Encounter (Signed)
Megan [name] called because [name] needs a new prescription for her milk sent to [facility]. JW

## 2013-03-09 NOTE — Telephone Encounter (Signed)
Called and LMVM to call back. Need to find out details. Lorenda Hatchet, [name]

## 2013-03-10 NOTE — Telephone Encounter (Signed)
It looks like there is an active prescription with 11 refills for Jevity that was sent to [id number]. Does this need to be changed? I'm covering Dr. Cherre Huger box and confused by what's being asked here. Thanks -Greig Castilla

## 2013-03-10 NOTE — Telephone Encounter (Signed)
The name of the milk requested is Jevity.  Please send to [facility] the refill request asap.

## 2013-03-13 ENCOUNTER — Other Ambulatory Visit: Payer: Self-pay | Admitting: Family Medicine

## 2013-03-13 DIAGNOSIS — G808 Other cerebral palsy: Secondary | ICD-10-CM

## 2013-03-14 ENCOUNTER — Telehealth: Payer: Self-pay | Admitting: Family Medicine

## 2013-03-14 NOTE — Telephone Encounter (Signed)
Called pt's aunt and she reports, that the pt still has a cough and lots of congestion. The claritin is not helping at all. I offered her to see Dr.Adamo again (see last dictation). Pt's aunt would like to have a referral for the pt to [facility], because the pt had these problems for a long time. I told her, that I would send the request.  .Arlyss Repress

## 2013-03-14 NOTE — Telephone Encounter (Signed)
Pt's aunt came in stating that Megan [name] needs a referral to the ear, nose, and throat dr.  At [facility] number [phone] as soon as possible.

## 2013-03-16 ENCOUNTER — Telehealth: Payer: Self-pay

## 2013-03-16 DIAGNOSIS — G808 Other cerebral palsy: Secondary | ICD-10-CM

## 2013-03-16 NOTE — Telephone Encounter (Signed)
States that Jevity milk was suppose to go to [facility] not [facility]. Also, needs referral to ENT

## 2013-03-17 MED ORDER — JEVITY PO LIQD: 1.0000 | Freq: Three times a day (TID) | ORAL | Status: DC

## 2013-03-17 NOTE — Telephone Encounter (Signed)
I faxed the script for Jevity to [facility] and submitted a referral to ENT at [facility]. Please call her caregiver when the appt has been made and also let her know the Burnell Blanks has been refilled with [facility] and that I am sorry for the confusion.  Thanks!

## 2013-03-17 NOTE — Telephone Encounter (Signed)
Pt's Aunt notified.  Matthe Sloane, Darlyne Russian, CMA

## 2013-03-20 MED ORDER — JEVITY PO LIQD: 1.0000 | Freq: Three times a day (TID) | ORAL | Status: DC

## 2013-03-27 DIAGNOSIS — G40909 Epilepsy, unspecified, not intractable, without status epilepticus: Secondary | ICD-10-CM | POA: Insufficient documentation

## 2013-03-27 DIAGNOSIS — J189 Pneumonia, unspecified organism: Secondary | ICD-10-CM

## 2013-03-27 DIAGNOSIS — R4182 Altered mental status, unspecified: Secondary | ICD-10-CM | POA: Insufficient documentation

## 2013-03-27 HISTORY — DX: Pneumonia, unspecified organism: J18.9

## 2013-03-28 ENCOUNTER — Telehealth: Payer: Self-pay | Admitting: *Deleted

## 2013-03-28 NOTE — Telephone Encounter (Signed)
Patient's caregiver Tamera Stands) calling to inform Dr. Richarda Blade and her supervisor that patient "has pneumonia and is on a vent because she did not get her referral on time."  Patient has been having "thick mucus--looks like glue."  States had this for months.  Patient went to [facility] ED on 03/27/13 and was admitted---"is in ICU."  Caregiver states she wants to talk with someone about patient's care and why referral was not given earlier.  Will route note to Dr. Richarda Blade and Dr. Randolm Idol (faculty advisor).  Gaylene Brooks, RN

## 2013-03-30 NOTE — Telephone Encounter (Signed)
Not sure what to do about this. When I saw her a month ago she was well appearing and just seemed to have some allergic rhinitis. Given her chronic issues, mostly MRCP, it is not entirely surprising that it progressed to pneumonia and she ended up intubated. I feel like if she was getting sicker I should have heard about it or she should have sought care sooner. I don't feel like my entering an ENT referral a few days earlier would have made any difference. Should I call her? I don't particularly want to get yelled at and it kind of sounds like she might want to talk to a supervisor...   Thanks!

## 2013-04-02 DIAGNOSIS — R6510 Systemic inflammatory response syndrome (SIRS) of non-infectious origin without acute organ dysfunction: Secondary | ICD-10-CM | POA: Insufficient documentation

## 2013-04-02 DIAGNOSIS — J690 Pneumonitis due to inhalation of food and vomit: Secondary | ICD-10-CM | POA: Insufficient documentation

## 2013-04-05 ENCOUNTER — Telehealth: Payer: Self-pay | Admitting: Family Medicine

## 2013-04-05 NOTE — Telephone Encounter (Signed)
The patient's aunt called the resident work room at [phone] and wanted to speak to someone within the hospital system who is "in charge" of family practice to report "malpractice." The patient is concerned that poor care at the [facility] led to her niece, Donnarae Rae, becoming very sick. She did not specify. I told her that I would have someone call her back who may be able to direct her to the proper person.

## 2013-04-12 NOTE — Telephone Encounter (Signed)
I called her back at least a week ago and she reported having complaints about everything in our office and should probably talk to Olegario Messier or someone from patient relations at the hospital because she did not want to speak with me any further. I would be happy to fill someone in on the background about my interactions with them if you need more details.

## 2013-04-12 NOTE — Telephone Encounter (Signed)
Returned call to patient's grandmother, but spoke with patient's aunt.  Aunt states patient received "awful & inadequate care" due to misdiagnosis. Was recently dx'd with LLL pneumonia and "almost lost her life."   Patient is still in [facility] and may be discharged home this week.  Family would like to speak with someone regarding patient's care.  Will route note to Drs. Jennette Kettle (Med. Director) & Randolm Idol (faculty advisor) to call family back to discuss care & concerns.  Gaylene Brooks, RN

## 2013-04-14 NOTE — Telephone Encounter (Signed)
I spoke with Megan [name] re these issues. Her complaint is that:  #1 we did not diagnose Megan [name] correctly when we saw her in October, she says we diagnosed her with sinusitis (looking at the note it was actually allergic rhinitis). She reports that approximately 2 weeks ago she had to take Megan [name] to a [name] in another city, Megan [name] Megan [name], where she was admitted with left lower lobe pneumonia and ultimately placed in the ICU on a ventilator. She has since returned home and is doing better. The family is dissatisfied with her care saying that we misdiagnosed her from the beginning.( I think she is referring to visit in October) When  I tried to get some more specific details about whether or not they thought she had pneumonia when we saw her in October, Megan [name] said she did not feel well and did not really want to talk to me any  longer and gave me two other phone numbers to call. [phone] and [phone]) Additionally, she said something about the doctor here not taking care of getting her tube feeds prescription appropriately but when I tried to explore that, she became angry and terminated call (she hung up abruptly)  I then called [phone] and spoke with with someone who is evidently  Megan [name]. She said she did not want to speak with me and that I should  speak to the grandmother and gave me the  number that Megan [name] had given me, [phone]. I called that number I got no answer. I think Megan [name] is apparently the grandmother of Megan [name]. According to the [name], she is at work but could answer calls, however she did not answer. I left her message detailing that I was trying  to sort out these issues and that I would be in office until 5 PM today and that if I did not get a call back from her I'll call her back on Monday.

## 2013-04-18 ENCOUNTER — Ambulatory Visit (INDEPENDENT_AMBULATORY_CARE_PROVIDER_SITE_OTHER): Payer: Medicaid Other | Admitting: Family Medicine

## 2013-04-18 VITALS — BP 118/78 | HR 101 | Temp 98.1°F | Resp 16

## 2013-04-18 DIAGNOSIS — K5900 Constipation, unspecified: Secondary | ICD-10-CM

## 2013-04-18 DIAGNOSIS — J151 Pneumonia due to Pseudomonas: Secondary | ICD-10-CM | POA: Insufficient documentation

## 2013-04-18 DIAGNOSIS — D219 Benign neoplasm of connective and other soft tissue, unspecified: Secondary | ICD-10-CM

## 2013-04-18 DIAGNOSIS — D259 Leiomyoma of uterus, unspecified: Secondary | ICD-10-CM

## 2013-04-18 DIAGNOSIS — R569 Unspecified convulsions: Secondary | ICD-10-CM

## 2013-04-18 MED ORDER — POLYETHYLENE GLYCOL 3350 17 GM/SCOOP PO POWD: ORAL | Status: DC

## 2013-04-18 NOTE — Patient Instructions (Signed)
It was great to see you today!  Try prune juice or miralax for her constipation. Use 1/2 to 1 cap of miralax daily. You can go to every other day if she has loose stools.  Stop the miralax after 1 week to see if she gets back to normal.   Follow up 1 month.

## 2013-04-18 NOTE — Assessment & Plan Note (Addendum)
During hospitalization dilantin was dc'd, Started on keppra, continued depakote and primidone Has neuro folow up scheduled.

## 2013-04-18 NOTE — Assessment & Plan Note (Signed)
Recommened prune juice per family's request then miralax 1/2 to 1 cup daily to titrate to pudding consistency stools. Recommended stopping after approx 1 week to see if she returns to baseline.

## 2013-04-18 NOTE — Progress Notes (Signed)
Patient ID: Megan [name], female   DOB: [birth year], 32 y.o.   MRN: [id number]  Kevin Fenton, MD Phone: [phone]  Subjective:  Chief complaint-noted  # F/u for recent hospitalization.   32 year old female with cerebral palsy, severe intellectual delay, and seizure disorder following up here after hospitalization including intubation at [facility].  On review of the discharge summary from care everywhere she had Klebsiella pneumonia bacteremia, and Pseudomonas isolated from her sputum. She was intubated and extubated on December 7. She presented with a temperature of 89.4. She had seizures while admitted, dilantin was dc'd and Keppra was started. She also had an ileus and had emesis secondary to that. CT abdomen was performed and found a 3.6 cm lesion on her uterus that's very likely to be a fibroid.  Her mother and sister accompanied her to the visit and stated that she's done well since she got home. She's continued to cough but no more than when she was admitted. She now has a pump to use for tube feeds at home.  They complain that she's had some mild abdominal aching, they assumed that she may be starting her period, but on discussion she's not had a bowel movement in 4 days. Her mother would like to use some prune juice to see if that would help her bowels move, and would like to use MiraLax after that.   ROS-  No fever, chills, sweats No dyspnea Positive abdominal pain No emesis   Past Medical History Patient Active Problem List   Diagnosis Date Noted  . Unspecified constipation 04/18/2013  . Pneumonia due to Pseudomonas 04/18/2013  . Fibroid 04/18/2013  . Allergic rhinitis 11/04/2012  . 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 Encounter for long-term (current) use of other medications 10/19/2012  . Severe intellectual disabilities 10/19/2012  . 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 Feeding difficulties and mismanagement 02/19/2012  . Well adult exam 05/27/2011  . Hypothyroidism 06/25/2010  . Hypertension 06/23/2010  . Seizure 06/06/2010  . CEREBRAL PALSY 02/21/2010  . BLINDNESS, BILATERAL 02/21/2010    Medications- reviewed and updated Current Outpatient Prescriptions  Medication Sig Dispense Refill  . levETIRAcetam (KEPPRA) 100 MG/ML solution 1,000 mg 2 (two) times daily.      . fluticasone (FLONASE) 50 MCG/ACT nasal spray Place 2 sprays into the nose daily.      Marland Kitchen levothyroxine (SYNTHROID, LEVOTHROID) 88 MCG tablet Take 88 mcg by mouth daily.      Marland Kitchen loratadine (CLARITIN) 5 MG/5ML syrup Take 10 mLs (10 mg total) by mouth daily.  300 mL  12  . Nutritional Supplements (JEVITY) LIQD 1 Can by Per J Tube route 3 (three) times daily.  90 Can  11  . polyethylene glycol powder (GLYCOLAX/MIRALAX) powder Take 1/2 to 1 cap per tube daily as needed for constipation.  3350 g  1  . primidone (MYSOLINE) 250 MG tablet Take 125 mg by mouth 2 (two) times daily. Give 1/2 tablet crushed via peg tube at night      . ranitidine (ZANTAC) 75 MG/5ML syrup 5 mLs (75 mg total) by Per J Tube route 2 (two) times daily.  480 mL  5  . valproate (DEPAKENE) 250 MG/5ML syrup Take 650 mg by mouth 2 (two) times daily. Give 13 ml by peg tube every 6 hours       No current facility-administered medications for this visit.  Objective: BP 118/78  Pulse 101  Temp(Src) 98.1 F (36.7 C) (Oral)  Resp 16  SpO2 99% Gen: NAD, alert, cooperative with exam, patient in a wheelchair with contracted arms tilted head, moderate amount of sputum running out of her mouth, and apparent severe changes of cerebral palsy. HEENT: NCAT, MMM CV: RRR, good S1/S2, no murmur Resp: CTABL, no wheezes, non-labored Abd: SNTND, BS present, no guarding or organomegaly Ext: No edema, warm  Assessment/Plan:  Pneumonia due to Pseudomonas Pneumonia  with severe sepsis at [facility], discharged 2 weeks ago Pseudomonas and klebsiella isolated, treated with 14 days of zosyn Doing well now, no current concerns for continued infection Reviewed red flags with family, follow up 1 month with PCP.   Seizure During hospitalization dilantin was dc'd, Started on keppra, continued depakote and primidone Has neuro folow up scheduled.   Unspecified constipation Recommened prune juice per family's request then miralax 1/2 to 1 cup daily to titrate to pudding consistency stools. Recommended stopping after approx 1 week to see if she returns to baseline.   Fibroid Seen on recent CT at [facility] Discussed no need for intervention unless she has pain or menorrhagia.  Family will monitor and f/u as needed.      Meds ordered this encounter  Medications  . polyethylene glycol powder (GLYCOLAX/MIRALAX) powder    Sig: Take 1/2 to 1 cap per tube daily as needed for constipation.    Dispense:  3350 g    Refill:  1  . levETIRAcetam (KEPPRA) 100 MG/ML solution    Sig: 1,000 mg 2 (two) times daily.

## 2013-04-18 NOTE — Assessment & Plan Note (Signed)
Seen on recent CT at [facility] Discussed no need for intervention unless she has pain or menorrhagia.  Family will monitor and f/u as needed.

## 2013-04-18 NOTE — Assessment & Plan Note (Signed)
Pneumonia with severe sepsis at [facility], discharged 2 weeks ago Pseudomonas and klebsiella isolated, treated with 14 days of zosyn Doing well now, no current concerns for continued infection Reviewed red flags with family, follow up 1 month with PCP.

## 2013-05-01 ENCOUNTER — Encounter: Payer: Self-pay | Admitting: *Deleted

## 2013-05-02 ENCOUNTER — Telehealth: Payer: Self-pay | Admitting: Family Medicine

## 2013-05-03 NOTE — Telephone Encounter (Signed)
I just looked at her discharge summary from [facility] which states that she should STOP taking phenytoin and take Keppra instead. Let's go with that until they talk to the neurologist.  Please call and tell them they are not supposed to give phenytoin to her anymore as it has been replaced by the [place] and to discuss this with the neurologist if they have further questions.

## 2013-05-03 NOTE — Telephone Encounter (Signed)
Left message on identifiable VM for Megan [name] to call pt's neurologist since we do not follow his seizure medication dosage.  Megan [name], Megan [name], Megan [name]

## 2013-05-03 NOTE — Telephone Encounter (Signed)
I have not managed her phenytoin previously. I would ask the caretakers to call her neurologist.

## 2013-05-03 NOTE — Telephone Encounter (Signed)
Request received for order for new phenytoin dose. SHe was recently dc'd from the hospital on a different dose than is documented in the request. Will defer to PCP who may choose to defer to neuro as the dose should be managed by them and is unclear presently.   Laroy Apple, MD [place] Resident, PGY-2 05/03/2013, 1:55 PM

## 2013-05-13 IMAGING — CR DG CHEST 2V
1 series · 1 of 1 positions shown · non-contrast
Comparison: 01/25/2010

CLINICAL DATA: Cough today.

CHEST - 2 VIEW

[view not recorded]
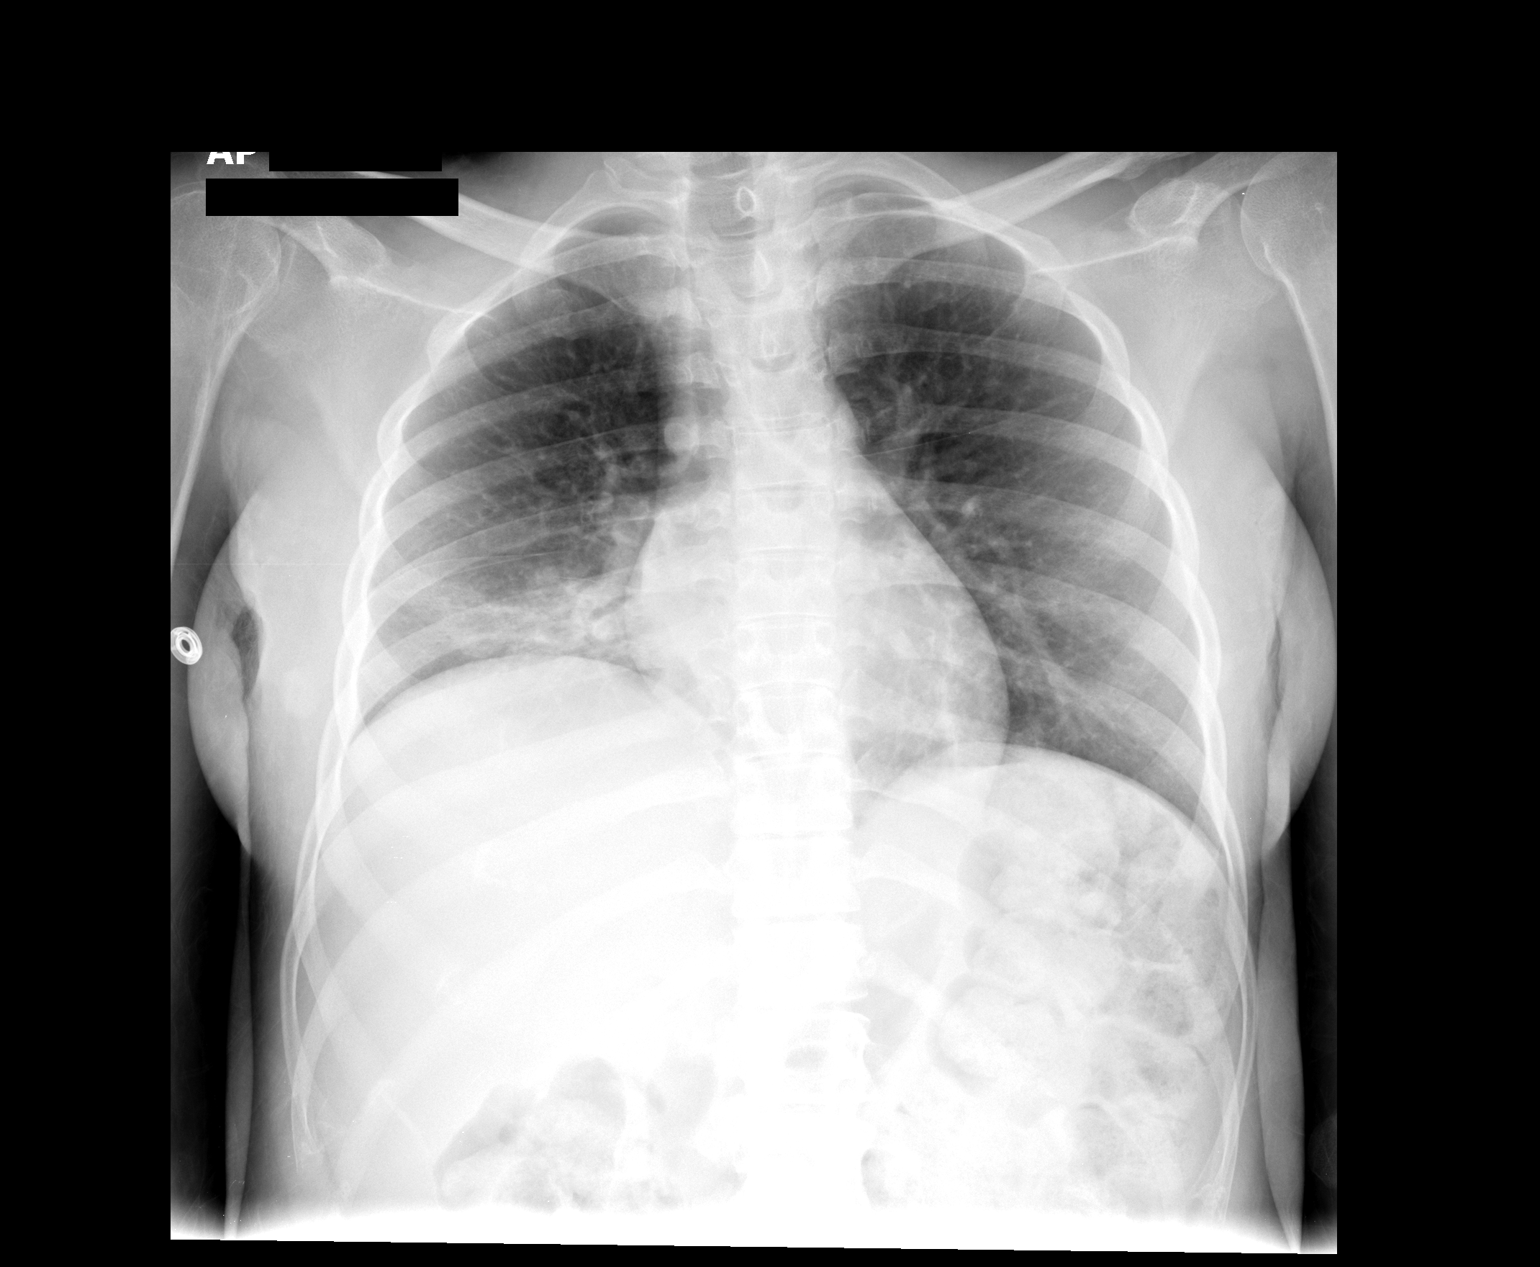

[1 of 1 positions shown; findings below may reference images not displayed]

FINDINGS: Volume loss in the right lung base with focal airspace
consolidation consistent with pneumonia.  No blunting of
costophrenic angles.  Left lung appears clear expanded.  Normal
heart size and pulmonary vascularity.
IMPRESSION: Right lower lobe pneumonia.

## 2013-05-17 ENCOUNTER — Encounter: Payer: Self-pay | Admitting: Family Medicine

## 2013-05-17 ENCOUNTER — Ambulatory Visit (INDEPENDENT_AMBULATORY_CARE_PROVIDER_SITE_OTHER): Payer: Medicaid Other | Admitting: Family Medicine

## 2013-05-17 DIAGNOSIS — J151 Pneumonia due to Pseudomonas: Secondary | ICD-10-CM

## 2013-05-17 NOTE — Patient Instructions (Signed)

## 2013-05-17 NOTE — Assessment & Plan Note (Signed)
Patient completely recovered from pneumonia, no further respiratory complaints. Antiepileptics adjusted at [facility], patient has neurology follow-up to discuss this further.  - will discuss PCP change as family remains very angry with me for "missing" her pneumonia, unlikely to be conducive to therapeutic relationship

## 2013-05-17 NOTE — Progress Notes (Signed)
   Subjective:    Patient ID: Megan [name], female    DOB: [birth year], 33 y.o.   MRN: [id number]  HPI Pt presents for hospital follow-up. She is feeling much better since discharge. She is not having any difficulty breathing. Her behavior has returned to baseline.   Review of Systems  Constitutional: Negative for fever and activity change.  HENT: Positive for drooling. Negative for congestion, postnasal drip and rhinorrhea.   Gastrointestinal: Negative for vomiting, diarrhea, constipation and blood in stool.  All other systems reviewed and are negative.       Objective:   Physical Exam  Nursing note and vitals reviewed. Constitutional: She appears well-developed and well-nourished. No distress.  HENT:  Head: Normocephalic and atraumatic.  Right Ear: External ear normal.  Left Ear: External ear normal.  Nose: Nose normal.  Eyes: Conjunctivae are normal. Right eye exhibits no discharge. Left eye exhibits no discharge. No scleral icterus.  Neck: Normal range of motion.  Cardiovascular: Normal rate.   Pulmonary/Chest: Effort normal.  Abdominal: Soft. She exhibits no distension.  Musculoskeletal:  Hypertonia throughout, non-ambulatory  Neurological: She is alert.  Severely delayed, nonverbal  Skin: Skin is warm and dry. No rash noted. She is not diaphoretic. No erythema.          Assessment & Plan:

## 2013-05-23 ENCOUNTER — Ambulatory Visit: Payer: Medicaid Other | Admitting: Family

## 2013-05-23 NOTE — Telephone Encounter (Signed)
This encounter was created in error - please disregard.

## 2013-05-29 ENCOUNTER — Encounter: Payer: Self-pay | Admitting: Family

## 2013-05-29 ENCOUNTER — Ambulatory Visit (INDEPENDENT_AMBULATORY_CARE_PROVIDER_SITE_OTHER): Payer: Medicaid Other | Admitting: Family

## 2013-05-29 VITALS — BP 110/74 | HR 76

## 2013-05-29 DIAGNOSIS — R569 Unspecified convulsions: Secondary | ICD-10-CM

## 2013-05-29 DIAGNOSIS — Z79899 Other long term (current) drug therapy: Secondary | ICD-10-CM

## 2013-05-29 DIAGNOSIS — G40319 Generalized idiopathic epilepsy and epileptic syndromes, intractable, without status epilepticus: Secondary | ICD-10-CM

## 2013-05-29 DIAGNOSIS — F72 Severe intellectual disabilities: Secondary | ICD-10-CM

## 2013-05-29 DIAGNOSIS — G809 Cerebral palsy, unspecified: Secondary | ICD-10-CM

## 2013-05-29 DIAGNOSIS — K117 Disturbances of salivary secretion: Secondary | ICD-10-CM

## 2013-05-29 MED ORDER — LEVETIRACETAM 100 MG/ML PO SOLN: ORAL | Status: DC

## 2013-05-29 MED ORDER — PRIMIDONE 250 MG PO TABS: ORAL_TABLET | ORAL | Status: DC

## 2013-05-29 MED ORDER — VALPROATE SODIUM 250 MG/5ML PO SYRP: ORAL_SOLUTION | ORAL | Status: DC

## 2013-05-29 NOTE — Progress Notes (Signed)
Patient: Megan [name] MRN: [id number] Sex: female DOB: October 26, 1980  Provider: Rockwell Germany, NP Location of Care: [facility]  Note type: Routine return visit  History of Present Illness: Referral Source: [facility]  History from: caregiver (family member) Chief Complaint: Seizures  AISHAH [name] is a 33 y.o. female with history of intractable minor motor,and generalized tonic-clonic seizure activity, profound mental retardation, autistic behaviors,dysphagia with PEG tube in place, and cortical blindness. She was on Coumadin briefly in 2009. She requires total care for all activities of daily living. She has had no tonic-clonic seizures in over 3 years according to her family. She returns today in follow up from August 2014. At that time, she was experiencing Dilantin toxicity. Her Dilantin levels were markedly elevated at 63.4 mcg/ml. Her family was reluctant to take her for frequent lab draws but the blood level was eventually obtained and found to be at 20.1 mcg/ml. She was sleepy during the time of the Dilantin toxicity but fortunately did not have increase in seizures. Today her family member with her today tells me that Megan [name] was admitted to [facility] for 18 days in early December for pneumonia. While there, she was intubated due to what sounds like sedative effects. She was also found to have facial twitching, and was found to have Dilantin levels of 38.2 mcg/ml. She had evaluation by EMU while at [facility] and was taken off Dilantin and started on Levetiracetam. She has tolerated this well. She continues to have intermittent facial twitching that lasts a few seconds. She is more alert and her behavior has returned to baseline. She is enrolled in a day program at [facility].   Review of Systems: 12 system review was unremarkable  Past Medical History  Diagnosis Date  . Cerebral palsy, quadriplegic   . Cortical blindness   . Seizures   . Mental retardation    . Cerebral palsy   . Hypertension 06/23/2010  . Vision abnormalities   . Allergic rhinitis 11/04/2012  . CHF (congestive heart failure)   . COPD (chronic obstructive pulmonary disease)   . Pneumonia December 2014    Was intubated, [facility]  . Dilantin toxicity August 2014   Hospitalizations: yes, Head Injury: no, Nervous System Infections: no, Immunizations up to date: yes Past Medical History Comments: Patient was hospitalized at [facility] on Dec. 1 until Dec. 18 of 2014 due to pneumonia.  Surgical History Past Surgical History  Procedure Laterality Date  . Peg tube placement    . Peg placement  09/03/2011    Procedure: PERCUTANEOUS ENDOSCOPIC GASTROSTOMY (PEG) REPLACEMENT;  Surgeon: Jerene Bears, MD;  [facility]: [facility];  Service: Gastroenterology;  Laterality: N/A;  . Peg placement  02/19/2012    Procedure: PERCUTANEOUS ENDOSCOPIC GASTROSTOMY (PEG) REPLACEMENT;  Surgeon: Lafayette Dragon, MD;  Location: [facility];  Service: Endoscopy;  Laterality: N/A;  needs gastrostomy button 24 Fr 4.4 and 3.5 available   Family History family history includes Cancer in her maternal grandfather; Diabetes in her maternal aunt, maternal grandmother, and mother; Hypertension in her other; Stroke in her maternal grandmother. Family History is negative migraines, seizures, cognitive impairment, blindness, deafness, birth defects, chromosomal disorder, autism.  Social History History   Social History  . Marital Status: Single    Spouse Name: N/A    Number of Children: N/A  . Years of Education: N/A   Social History Main Topics  . Smoking status: Passive Smoke Exposure - Never Smoker  . Smokeless tobacco: Never  Used  . Alcohol Use: No  . Drug Use: No  . Sexual Activity: No   Other Topics Concern  . None   Social History Narrative  . None   Educational level: special education Occupation: N/A   Living with maternal grandmother and maternal aunt  Hobbies/Interest: Enjoys listening to music  and loves to laugh.  School comments: Amyra graduated from [employer] several years ago.  She now attends [employer].  Current Outpatient Prescriptions on File Prior to Visit  Medication Sig Dispense Refill  . fluticasone (FLONASE) 50 MCG/ACT nasal spray Place 2 sprays into the nose daily.      Marland Kitchen levETIRAcetam (KEPPRA) 100 MG/ML solution 1,000 mg 2 (two) times daily.      Marland Kitchen levothyroxine (SYNTHROID, LEVOTHROID) 88 MCG tablet Take 88 mcg by mouth daily.      Marland Kitchen loratadine (CLARITIN) 5 MG/5ML syrup Take 10 mLs (10 mg total) by mouth daily.  300 mL  12  . Nutritional Supplements (JEVITY) LIQD 1 Can by Per J Tube route 3 (three) times daily.  90 Can  11  . polyethylene glycol powder (GLYCOLAX/MIRALAX) powder Take 1/2 to 1 cap per tube daily as needed for constipation.  3350 g  1  . primidone (MYSOLINE) 250 MG tablet Take 125 mg by mouth 2 (two) times daily. Give 1/2 tablet crushed via peg tube at night      . ranitidine (ZANTAC) 75 MG/5ML syrup 5 mLs (75 mg total) by Per J Tube route 2 (two) times daily.  480 mL  5  . valproate (DEPAKENE) 250 MG/5ML syrup Take 650 mg by mouth 2 (two) times daily. Give 13 ml by peg tube every 6 hours       No current facility-administered medications on file prior to visit.   The medication list was reviewed and reconciled. All changes or newly prescribed medications were explained.  A complete medication list was provided to the patient/caregiver.  Allergies  Allergen Reactions  . Doxycycline     REACTION: Blisters / swelling    Physical Exam BP 110/74  Pulse 76 She is in a wheelchair and unable to stand to be weighed. General: Well developed young woman, seated in wheelchair Head: Normocephalic and atraumatic  Ears, Nose and Throat: Tympanic membranes partially occluded by cerumen bilaterally. Unable to fully examine her posterior pharynx due to patient's inability to cooperate. Her tongue protrudes.  Neck: supple, unable to maintain  head control  Respiratory: lungs clear to auscultation  Cardiovascular: regular rate and rhythm. No murmurs  Musculoskeletal: contractures of extremities at the knees and elbows. she has hypotonia in her trunk  Skin: intact Trunk: PEG tube intact   Neurologic Exam  Mental Status: Awake but drowsy at times. Evidence of profound intellectual delay. She has no speech. She smiles on occasion.  Cranial Nerves: Fundoscopic exam reveals poorly visualized disc margins. Pupils equal, briskly reactive to light. Unable to evaluate EOM's due to her inability to follow instructions. She has dysconjugate eye movements. Does not fix and follow on objects in her visual field. She turns to localize sounds but does not do so consistently. She has lower facial weakness with drooling. Her tongue protrudes at all times. Unable to adequately evaluate neck flexion and extension.  Motor: She has spastic quadriplegia  Sensory: Withdraws to noxious stimuli  Coordination: Cannot cooperate with exam  Gait and Station: wheelchair bound, does not ambulate  Reflexes: diminished throughout   Assessment and Plan Megan [name] is a 32 year  old woman with history of intractable minor motor,and generalized tonic-clonic seizure activity, profound mental retardation, autistic behaviors,dysphagia with PEG tube in place, and cortical blindness. She was recently hospitalized at [facility] with pnuemonia. Megan [name] was also evaluated at [facility] while there because of suspected seizures and elected Dilantin levels. The Dilantin was stopped and she was changed to Levetiracetam. She has tolerated this well without breakthrough seizures. She has ongoing intermittent brief facial twitching that is not problematic. She will continue on her medication without change and will return for follow up in 6 months or sooner if needed.

## 2013-05-29 NOTE — Patient Instructions (Signed)
Continue Megan [name]'s medications as ordered. Let me know if her seizures increase in frequency or severity.  Please plan to return for follow up in 6 months or sooner if needed.

## 2013-05-30 ENCOUNTER — Other Ambulatory Visit: Payer: Self-pay | Admitting: *Deleted

## 2013-05-30 ENCOUNTER — Encounter (HOSPITAL_COMMUNITY)
Admission: AD | Disposition: A | Discharge status: Home / Self Care | Payer: Self-pay | Source: Ambulatory Visit | Attending: Internal Medicine

## 2013-05-30 ENCOUNTER — Telehealth: Payer: Self-pay | Admitting: *Deleted

## 2013-05-30 ENCOUNTER — Encounter (HOSPITAL_COMMUNITY): Payer: Self-pay | Admitting: Internal Medicine

## 2013-05-30 ENCOUNTER — Ambulatory Visit (HOSPITAL_COMMUNITY)
Admission: AD | Admit: 2013-05-30 | Discharge: 2013-05-30 | Disposition: A | Discharge status: Home / Self Care | Payer: Medicaid Other | Source: Ambulatory Visit | Attending: Internal Medicine | Admitting: Internal Medicine

## 2013-05-30 ENCOUNTER — Telehealth: Payer: Self-pay | Admitting: Internal Medicine

## 2013-05-30 DIAGNOSIS — H47619 Cortical blindness, unspecified side of brain: Secondary | ICD-10-CM | POA: Insufficient documentation

## 2013-05-30 DIAGNOSIS — G809 Cerebral palsy, unspecified: Secondary | ICD-10-CM | POA: Insufficient documentation

## 2013-05-30 DIAGNOSIS — J449 Chronic obstructive pulmonary disease, unspecified: Secondary | ICD-10-CM | POA: Insufficient documentation

## 2013-05-30 DIAGNOSIS — K9423 Gastrostomy malfunction: Secondary | ICD-10-CM

## 2013-05-30 DIAGNOSIS — I10 Essential (primary) hypertension: Secondary | ICD-10-CM | POA: Insufficient documentation

## 2013-05-30 DIAGNOSIS — F79 Unspecified intellectual disabilities: Secondary | ICD-10-CM | POA: Insufficient documentation

## 2013-05-30 DIAGNOSIS — I509 Heart failure, unspecified: Secondary | ICD-10-CM | POA: Insufficient documentation

## 2013-05-30 DIAGNOSIS — J4489 Other specified chronic obstructive pulmonary disease: Secondary | ICD-10-CM | POA: Insufficient documentation

## 2013-05-30 DIAGNOSIS — Z431 Encounter for attention to gastrostomy: Secondary | ICD-10-CM | POA: Insufficient documentation

## 2013-05-30 HISTORY — PX: PEG PLACEMENT: SHX5437

## 2013-05-30 SURGERY — REPLACEMENT, PEG TUBE, WITHOUT ENDOSCOPY

## 2013-05-30 MED ORDER — SODIUM CHLORIDE 0.9 % IV SOLN: INTRAVENOUS | Status: DC

## 2013-05-30 MED ORDER — AMBULATORY NON FORMULARY MEDICATION: Status: DC

## 2013-05-30 NOTE — Telephone Encounter (Signed)
Tube placement today with 15F BS replacement tube per Dr. Hilarie Fredrickson. A button tube has been ordered and endo to call us when it arrives. Patient needs an abdominal binder to hold tube in place per Dr. Hilarie Fredrickson.Spoke with patient's caregiver and they use [facility].Spoke with [employer] and they do not have abdominal binders. Patient's caregiver(Lynn) will call [facility] and then let me know if she needs rx faxed.

## 2013-05-30 NOTE — H&P (Signed)
HPI:  Megan [name] is a 32 yo female with PMH of cerebral palsy, cortical blindness, seizures, HTN, COPD, CHF and Pseudomonas pneumonia with recent [place] in [place] who called the office today with her PEG tube became dislodged while she was at daycare. Someone at the daycare facility placed a plastic tubing, a much smaller caliber, in the PEG tube tract. They present today for PEG replacement. No new complaints. She gets continuous TF at night via pump.    Past Medical History    Diagnosis  Date    .  Cerebral palsy, quadriplegic     .  Cortical blindness     .  Seizures     .  Mental retardation     .  Cerebral palsy     .  Hypertension  06/23/2010    .  Vision abnormalities     .  Allergic rhinitis  11/04/2012    .  CHF (congestive heart failure)     .  COPD (chronic obstructive pulmonary disease)     .  Pneumonia  December 2014      Was intubated, [facility]    .  Dilantin toxicity  August 2014    Past Surgical History    Procedure  Laterality  Date    .  Peg tube placement      .  Peg placement   09/03/2011      Procedure: PERCUTANEOUS ENDOSCOPIC GASTROSTOMY (PEG) REPLACEMENT; Surgeon: Jay M Pyrtle, MD; [facility]; Service: Gastroenterology; Laterality: N/A;    .  Peg placement   02/19/2012      Procedure: PERCUTANEOUS ENDOSCOPIC GASTROSTOMY (PEG) REPLACEMENT; Surgeon: Dora M Brodie, MD; [facility]; Service: Endoscopy; Laterality: N/A; needs gastrostomy button 24 Fr 4.4 and 3.5 available    Current Facility-Administered Medications    Medication  Dose  Route  Frequency  Provider  Last Rate  Last Dose    .  0.9 % sodium chloride infusion   Intravenous  Continuous  Jay M Pyrtle, MD      Allergies    Allergen  Reactions    .  Doxycycline       REACTION: Blisters / swelling    Family History    Problem  Relation  Age of Onset    .  Diabetes  Mother     .  Diabetes  Maternal Grandmother     .  Stroke  Maternal Grandmother     .  Diabetes  Maternal Aunt      .  Hypertension  Other       Fhx    .  Cancer  Maternal Grandfather       Died at 82    History    Substance Use Topics    .  Smoking status:  Passive Smoke Exposure - Never Smoker    .  Smokeless tobacco:  Never Used    .  Alcohol Use:  No    ROS:  As per history of present illness, otherwise negative  BP 110/65  Wt 100 lb (45.36 kg)  SpO2 94%  Gen: short stature female lying in the bed in NAD  HEENT: op clear  CV: RRR  Pulm: clear b/l anteriorly  Abd: soft, NT/ND, +BS throughout  Ext: no edema  ASSESSMENT/PLAN:  32 yo female with PMH of cerebral palsy, cortical blindness, seizures, HTN, COPD, CHF and Pseudomonas pneumonia with recent hospitalization [place] in [place] who called the office today with her PEG tube became   dislodged while she was at daycare  1. PEG tube dysfunction -- she had a mini-button PEG 24 Fr 3.5 cm in place previously. I attempted to replace the PEG tube using a mini button 24 French replacement, but this was unable to be advanced through the established tract. It was apparent that the tract had closed slightly since the tube became dislodged approximately 5 hours ago. I was able to replace the PEG using a [employer] 22 French replacement balloon PEG. The balloon was filled with 6 cc of sterile water and the bumper was advanced to the level of the skin, 3.5 cm. Care was taken to ensure this was not too tight against the skin.  --We will plan to order a mini button 22 French replacement and bring her back for exchange when this tube is available  --A time she can use the 22 French [employer] replacement PEG for feedings at home as before  --The tube was flushed with 60 cc of tap water without resistance. The patient experienced no obvious discomfort during or after the procedure. --I recommended an abdominal binder while she is being transported to and from daycare to help prevent the PEG from becoming dislodged again         

## 2013-05-30 NOTE — Procedures (Signed)
PEG replaced as described in the H&P -- see plan

## 2013-05-30 NOTE — Telephone Encounter (Signed)
Spoke with patient's niece and her feeding tube fell out. Spoke with Dr. Olevia Perches and need to send patient to endo unit if ok with Dr. Hilarie Fredrickson. Spoke with Dr Hilarie Fredrickson and he would like patient to go to [facility] if possible. Spoke with [facility] and they have a 24 [place] catheter. They would like patient to come now. Spoke with patient's caregiver and she will go to [facility] now. [facility] tower entrance then to registration)

## 2013-05-31 ENCOUNTER — Encounter (HOSPITAL_COMMUNITY): Payer: Self-pay | Admitting: Internal Medicine

## 2013-06-07 ENCOUNTER — Telehealth: Payer: Self-pay | Admitting: *Deleted

## 2013-06-07 NOTE — Telephone Encounter (Signed)
Patient scheduled for PEG tube replacement on 07/03/13 at 10:00 AM.(Dawn) replace with 20 French x 3.5 cm mini button. Spoke with Jeani Hawking patient's EC and gave her date, time, place for PEG tube replacement.

## 2013-06-08 ENCOUNTER — Encounter: Payer: Self-pay | Admitting: *Deleted

## 2013-06-26 NOTE — Telephone Encounter (Signed)
Spoke with Sharee Pimple and verified that mini button has arrived at [facility].

## 2013-06-29 NOTE — Telephone Encounter (Signed)
Spoke with Megan [name] and reminded her of PEG change on 07/03/13. NPO after midnight.

## 2013-06-30 NOTE — H&P (Signed)
HPI:  Megan [name] is a 33 yo female with PMH of cerebral palsy, cortical blindness, seizures, HTN, COPD, CHF and Pseudomonas pneumonia with recent hospitalization [place] in [place] who called the office today with her PEG tube became dislodged while she was at daycare. Someone at the daycare facility placed a plastic tubing, a much smaller caliber, in the PEG tube tract. They present today for PEG replacement. No new complaints. She gets continuous TF at night via pump.    Past Medical History    Diagnosis  Date    .  Cerebral palsy, quadriplegic     .  Cortical blindness     .  Seizures     .  Mental retardation     .  Cerebral palsy     .  Hypertension  06/23/2010    .  Vision abnormalities     .  Allergic rhinitis  11/04/2012    .  CHF (congestive heart failure)     .  COPD (chronic obstructive pulmonary disease)     .  Pneumonia  December 2014      Was intubated, [facility]    .  Dilantin toxicity  August 2014    Past Surgical History    Procedure  Laterality  Date    .  Peg tube placement      .  Peg placement   09/03/2011      Procedure: PERCUTANEOUS ENDOSCOPIC GASTROSTOMY (PEG) REPLACEMENT; Surgeon: Jerene Bears, MD; [facility]; Service: Gastroenterology; Laterality: N/A;    .  Peg placement   02/19/2012      Procedure: PERCUTANEOUS ENDOSCOPIC GASTROSTOMY (PEG) REPLACEMENT; Surgeon: Lafayette Dragon, MD; [facility]; Service: Endoscopy; Laterality: N/A; needs gastrostomy button 24 Fr 4.4 and 3.5 available    Current Facility-Administered Medications    Medication  Dose  Route  Frequency  Provider  Last Rate  Last Dose    .  0.9 % sodium chloride infusion   Intravenous  Continuous  Jerene Bears, MD      Allergies    Allergen  Reactions    .  Doxycycline       REACTION: Blisters / swelling    Family History    Problem  Relation  Age of Onset    .  Diabetes  Mother     .  Diabetes  Maternal Grandmother     .  Stroke  Maternal Grandmother     .  Diabetes  Maternal Aunt      .  Hypertension  Other       Fhx    .  Cancer  Maternal Grandfather       Died at 31    History    Substance Use Topics    .  Smoking status:  Passive Smoke Exposure - Never Smoker    .  Smokeless tobacco:  Never Used    .  Alcohol Use:  No    ROS:  As per history of present illness, otherwise negative  BP 110/65  Wt 100 lb (45.36 kg)  SpO2 94%  Gen: short stature female lying in the bed in NAD  HEENT: op clear  CV: RRR  Pulm: clear b/l anteriorly  Abd: soft, NT/ND, +BS throughout  Ext: no edema  ASSESSMENT/PLAN:  33 yo female with PMH of cerebral palsy, cortical blindness, seizures, HTN, COPD, CHF and Pseudomonas pneumonia with recent hospitalization [place] in [place] who called the office today with her PEG tube became  dislodged while she was at daycare  1. PEG tube dysfunction -- she had a mini-button PEG 24 Fr 3.5 cm in place previously. I attempted to replace the PEG tube using a mini button 24 [place] replacement, but this was unable to be advanced through the established tract. It was apparent that the tract had closed slightly since the tube became dislodged approximately 5 hours ago. I was able to replace the PEG using a [employer] 22 [place] replacement balloon PEG. The balloon was filled with 6 cc of sterile water and the bumper was advanced to the level of the skin, 3.5 cm. Care was taken to ensure this was not too tight against the skin.  --We will plan to order a mini button 22 French replacement and bring her back for exchange when this tube is available  --A time she can use the [address] [employer] replacement PEG for feedings at home as before  --The tube was flushed with 60 cc of tap water without resistance. The patient experienced no obvious discomfort during or after the procedure. --I recommended an abdominal binder while she is being transported to and from daycare to help prevent the PEG from becoming dislodged again

## 2013-07-03 ENCOUNTER — Encounter (HOSPITAL_COMMUNITY)
Admission: RE | Disposition: A | Discharge status: Home / Self Care | Payer: Self-pay | Source: Ambulatory Visit | Attending: Internal Medicine

## 2013-07-03 ENCOUNTER — Other Ambulatory Visit: Payer: Self-pay | Admitting: Family

## 2013-07-03 ENCOUNTER — Ambulatory Visit (HOSPITAL_COMMUNITY)
Admission: RE | Admit: 2013-07-03 | Discharge: 2013-07-03 | Disposition: A | Discharge status: Home / Self Care | Payer: Medicaid Other | Source: Ambulatory Visit | Attending: Internal Medicine | Admitting: Internal Medicine

## 2013-07-03 DIAGNOSIS — R633 Feeding difficulties, unspecified: Secondary | ICD-10-CM

## 2013-07-03 DIAGNOSIS — H47619 Cortical blindness, unspecified side of brain: Secondary | ICD-10-CM | POA: Insufficient documentation

## 2013-07-03 DIAGNOSIS — G809 Cerebral palsy, unspecified: Secondary | ICD-10-CM | POA: Insufficient documentation

## 2013-07-03 DIAGNOSIS — I10 Essential (primary) hypertension: Secondary | ICD-10-CM | POA: Insufficient documentation

## 2013-07-03 DIAGNOSIS — J4489 Other specified chronic obstructive pulmonary disease: Secondary | ICD-10-CM | POA: Insufficient documentation

## 2013-07-03 DIAGNOSIS — Z431 Encounter for attention to gastrostomy: Secondary | ICD-10-CM | POA: Insufficient documentation

## 2013-07-03 DIAGNOSIS — J449 Chronic obstructive pulmonary disease, unspecified: Secondary | ICD-10-CM | POA: Insufficient documentation

## 2013-07-03 HISTORY — PX: PEG PLACEMENT: SHX5437

## 2013-07-03 SURGERY — REPLACEMENT, PEG TUBE, WITHOUT ENDOSCOPY

## 2013-07-03 NOTE — Procedures (Signed)
PEG change Indications this 33 year old African American female with cerebral palsy has been dependent on tube feedings via PEG. Previous PEG change was in May 2013, October 2013 and in February 2015. Only temporary PEG was placed  Last month pending order of  a 22 F 3.5 cm button gastrostomy.  Existing gastrostomy is 9 [place] replacement gastrostomy area and it was removed by deflating the 6 cc balloon.. skin around the gastrostomy was clean. The gastrostomy was gently removed by traction and a new Button gastrostomy which was 20 [place] x 3.5 cm *( they don't make 15F) was gently inserted with mild pressure. Patency was checked by gravity feeding, there was no leakage. Balloon was inflated with 10 cc of sterile saline.  Instructions were given to the family as to skin care and initiation of flow tube feedings., As well as anti-reflex measures

## 2013-07-03 NOTE — Brief Op Note (Signed)
07/03/2013     Please see Brief Procedure note under Noted. Change of PEG- 20 F Button gastrostomy replaced without difficulty Orders written

## 2013-07-03 NOTE — Interval H&P Note (Signed)
History and Physical Interval Note:  07/03/2013 9:55 AM  Megan [name]  has presented today for surgery, with the diagnosis of PEG replacement, cerebral palsy, quadraplegic, cortical blindness, HTN, COPH, CHF, Seizures  The various methods of treatment have been discussed with the patient and family. After consideration of risks, benefits and other options for treatment, the patient has consented to  Procedure(s) with comments: PERCUTANEOUS ENDOSCOPIC GASTROSTOMY (PEG) REPLACEMENT (N/A) - Replace with 20 Fr x 3.5 cm mini button- as a surgical intervention .  The patient's history has been reviewed, patient examined, no change in status, stable for surgery.  I have reviewed the patient's chart and labs.  Questions were answered to the patient's satisfaction.     Delfin Edis

## 2013-07-04 ENCOUNTER — Encounter (HOSPITAL_COMMUNITY): Payer: Self-pay | Admitting: Internal Medicine

## 2013-07-07 ENCOUNTER — Telehealth: Payer: Self-pay | Admitting: Family Medicine

## 2013-07-07 ENCOUNTER — Other Ambulatory Visit: Payer: Self-pay | Admitting: Family

## 2013-07-07 MED ORDER — LEVOTHYROXINE SODIUM 88 MCG PO TABS: 88.0000 ug | ORAL_TABLET | Freq: Every day | ORAL | Status: DC

## 2013-07-07 NOTE — Telephone Encounter (Signed)
Refilled sent to pharmacy. Please inform family.  Thanks

## 2013-07-07 NOTE — Telephone Encounter (Signed)
Notified.  Megan [name], Megan [name], Megan [name]

## 2013-07-07 NOTE — Telephone Encounter (Signed)
Fwd to PCP for refill. Thanks. Javier Glazier, Gerrit Heck

## 2013-07-07 NOTE — Telephone Encounter (Signed)
Care Giver called and would like a refill on Megan [name]'s thyroid medication. She stated that she has been out for two days. jw

## 2013-07-17 ENCOUNTER — Telehealth: Payer: Self-pay | Admitting: *Deleted

## 2013-07-17 DIAGNOSIS — Z79899 Other long term (current) drug therapy: Secondary | ICD-10-CM

## 2013-07-17 DIAGNOSIS — G40309 Generalized idiopathic epilepsy and epileptic syndromes, not intractable, without status epilepticus: Secondary | ICD-10-CM

## 2013-07-17 NOTE — Telephone Encounter (Signed)
I left a message and asked Megan [name] to call back. TG

## 2013-07-17 NOTE — Telephone Encounter (Signed)
Megan [name] the patient's grandmother was called while at work today by the [facility], staff reported to her that the patient had an 8 minute seizure around 11:50 am she was called at 12:00 pm. Megan [name] says that the patient has not been sick nor has there been any changes that she feels would cause the patient to all of a sudden start having seizures after being seizure free for so long.  She also feels that the staff at [facility] has misjudged what they saw to be seizure activity which they described as the patient pushing herself forward and twitching. I informed Megan [name] that [name] Would see her today but if this truly was a seizure and it happens again lasting as long or longer than this episode she should go to the hospital. I explained to her that we don't have anything here in the office that would help or treat the patient. She confirmed understanding and stated should would take her to the hospital should this happen again.  Megan [name] can be reached at [phone].   Thanks,  [name].

## 2013-07-17 NOTE — Telephone Encounter (Signed)
Megan [name] called back and I talked with her about the seizure activity today. She said that Kameo was now smiling, laughing and seemed fine. I recommended that she take her tomorrow morning for blood draw and she agreed. I will fax order to [facility]. I cautioned her that if Jiana had a tonic clonic seizure lasting several minutes that EMS should be called. I explained why that I was concerned about [place] having an 8 minute seizure. Megan. Young agreed. I faxed lab orders to [facility] on [address]. TG

## 2013-07-17 NOTE — Telephone Encounter (Signed)
I reviewed the notes below and agree with the plan as outlined.

## 2013-07-18 LAB — CBC WITH DIFFERENTIAL/PLATELET
BASOS ABS: 0 10*3/uL (ref 0.0–0.1)
BASOS PCT: 0 % (ref 0–1)
Eosinophils Absolute: 0.1 10*3/uL (ref 0.0–0.7)
Eosinophils Relative: 1 % (ref 0–5)
HCT: 35 % — ABNORMAL LOW (ref 36.0–46.0)
HEMOGLOBIN: 11.6 g/dL — AB (ref 12.0–15.0)
Lymphocytes Relative: 61 % — ABNORMAL HIGH (ref 12–46)
Lymphs Abs: 3.4 10*3/uL (ref 0.7–4.0)
MCH: 30.4 pg (ref 26.0–34.0)
MCHC: 33.1 g/dL (ref 30.0–36.0)
MCV: 91.9 fL (ref 78.0–100.0)
MONO ABS: 0.6 10*3/uL (ref 0.1–1.0)
Monocytes Relative: 10 % (ref 3–12)
NEUTROS ABS: 1.6 10*3/uL — AB (ref 1.7–7.7)
NEUTROS PCT: 28 % — AB (ref 43–77)
Platelets: 340 10*3/uL (ref 150–400)
RBC: 3.81 MIL/uL — ABNORMAL LOW (ref 3.87–5.11)
RDW: 21.7 % — AB (ref 11.5–15.5)
WBC: 5.6 10*3/uL (ref 4.0–10.5)

## 2013-07-19 LAB — VALPROIC ACID LEVEL: Valproic Acid Lvl: 104.7 ug/mL — ABNORMAL HIGH (ref 50.0–100.0)

## 2013-07-19 LAB — ALT: ALT: 15 U/L (ref 0–35)

## 2013-07-21 LAB — PRIMIDONE AND METABOLITE LEVEL
PHENOBARBITAL: 12 mg/L — AB (ref 15.0–40.0)
PRIMIDONE, SERUM: 7.1 mg/L (ref 5.0–12.0)

## 2013-07-21 NOTE — Telephone Encounter (Signed)
I left a message for Mrs. Young to call me back so that I can review Kevyn's recent blood test results with her. TG

## 2013-07-21 NOTE — Telephone Encounter (Signed)
I called Mrs Megan [name] and reviewed blood test results with her. She said that [name] had been doing well since phone call earlier this week and had not had more seizures. I told her that we did not want to make changes in her treatment plan at this time and asked her to let us know if she had more seizures. She agreed with this plan.TG

## 2013-07-25 ENCOUNTER — Emergency Department (HOSPITAL_COMMUNITY)
Admission: EM | Admit: 2013-07-25 | Discharge: 2013-07-25 | Disposition: A | Discharge status: Home / Self Care | Payer: Medicaid Other | Source: Home / Self Care

## 2013-07-25 ENCOUNTER — Encounter (HOSPITAL_COMMUNITY): Payer: Self-pay | Admitting: Emergency Medicine

## 2013-07-25 DIAGNOSIS — H109 Unspecified conjunctivitis: Secondary | ICD-10-CM

## 2013-07-25 MED ORDER — POLYMYXIN B-TRIMETHOPRIM 10000-0.1 UNIT/ML-% OP SOLN: 2.0000 [drp] | OPHTHALMIC | Status: DC

## 2013-07-25 NOTE — ED Provider Notes (Signed)
Medical screening examination/treatment/procedure(s) were performed by non-physician practitioner and as supervising physician I was immediately available for consultation/collaboration.  Deonta Bomberger, M.D.  Claire Bridge C Darriel Sinquefield, MD 07/25/13 2348 

## 2013-07-25 NOTE — ED Notes (Signed)
Here with grandma Complains of bilateral eyelids swollen Having yellow discharge from eyes Developed a cough today as well

## 2013-07-25 NOTE — ED Provider Notes (Signed)
CSN: [id number]     Arrival date & time 07/25/13  1632 History   First MD Initiated Contact with Patient 07/25/13 1833     Chief Complaint  Patient presents with  . Eye Pain   (Consider location/radiation/quality/duration/timing/severity/associated sxs/prior Treatment) HPI Comments: Per grandmother 2 d of OS lid swelling, eye redness and drainage.  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 Cerebral palsy, quadriplegic   . Cortical blindness   . Seizures   . Mental retardation   . Cerebral palsy   . Hypertension 06/23/2010  . Vision abnormalities   . Allergic rhinitis 11/04/2012  . CHF (congestive heart failure)   . COPD (chronic obstructive pulmonary disease)   . Pneumonia December 2014    Was intubated, [facility]  . Dilantin toxicity August 2014   Past Surgical History  Procedure Laterality Date  . Peg tube placement    . Peg placement  09/03/2011    Procedure: PERCUTANEOUS ENDOSCOPIC GASTROSTOMY (PEG) REPLACEMENT;  Surgeon: Jerene Bears, MD;  Location: [facility];  Service: Gastroenterology;  Laterality: N/A;  . Peg placement  02/19/2012    Procedure: PERCUTANEOUS ENDOSCOPIC GASTROSTOMY (PEG) REPLACEMENT;  Surgeon: Lafayette Dragon, MD;  Location: [facility];  Service: Endoscopy;  Laterality: N/A;  needs gastrostomy button 24 Fr 4.4 and 3.5 available  . Peg placement N/A 05/30/2013    Procedure: PERCUTANEOUS ENDOSCOPIC GASTROSTOMY (PEG) REPLACEMENT;  Surgeon: Jerene Bears, MD;  Location: [place];  Service: Gastroenterology;  Laterality: N/A;  . Peg placement N/A 07/03/2013    Procedure: PERCUTANEOUS ENDOSCOPIC GASTROSTOMY (PEG) REPLACEMENT;  Surgeon: Lafayette Dragon, MD;  [facility];  Service: Endoscopy;  Laterality: N/A;  Replace with 20 Fr x 3.5 cm mini button-   Family History  Problem Relation Age of Onset  . Diabetes Mother   . Diabetes Maternal Grandmother   . Stroke Maternal Grandmother   . Diabetes Maternal  Aunt   . Hypertension Other     Fhx  . Cancer Maternal Grandfather     Died at 39   History  Substance Use Topics  . Smoking status: Passive Smoke Exposure - Never Smoker  . Smokeless tobacco: Never Used  . Alcohol Use: No   OB History   Grav Para Term Preterm Abortions TAB SAB Ect Mult Living                 Review of Systems  All other systems reviewed and are negative.    Allergies  Doxycycline  Home Medications   Current Outpatient Rx  Name  Route  Sig  Dispense  Refill  . loratadine (CLARITIN) 5 MG/5ML syrup   Oral   Take 10 mLs (10 mg total) by mouth daily.   300 mL   12   . ranitidine (ZANTAC) 75 MG/5ML syrup   Per J Tube   5 mLs (75 mg total) by Per J Tube route 2 (two) times daily.   480 mL   5   . valproate (DEPAKENE) 250 MG/5ML syrup      Give 13 ml by peg tube every 6 hours   1600 mL   5   . AMBULATORY NON FORMULARY MEDICATION      Abdominal binder   1 packet   1     Patient needs an abdominal binder to secure PEG tu ...   . fluticasone (FLONASE) 50 MCG/ACT nasal spray   Nasal   Place  2 sprays into the nose daily.         Marland Kitchen levETIRAcetam (KEPPRA) 100 MG/ML solution      Give 56mls in the morning and 29mls in the evening by peg tube   680 mL   5   . levothyroxine (SYNTHROID, LEVOTHROID) 88 MCG tablet   Oral   Take 1 tablet (88 mcg total) by mouth daily.   30 tablet   11   . Nutritional Supplements (JEVITY) LIQD   Per J Tube   1 Can by Per J Tube route 3 (three) times daily.   90 Can   11     Dx 343.8   . polyethylene glycol powder (GLYCOLAX/MIRALAX) powder      Take 1/2 to 1 cap per tube daily as needed for constipation.   3350 g   1   . primidone (MYSOLINE) 250 MG tablet      Give 1/2 tablet in the morning and 1/2 tablet in the evening crushed via peg tube   60 tablet   5   . trimethoprim-polymyxin b (POLYTRIM) ophthalmic solution   Left Eye   Place 2 drops into the left eye every 4 (four) hours.   10 mL    0    BP 99/66  Pulse 62  Temp(Src) 97.3 F (36.3 C)  Resp 15  SpO2 99% Physical Exam  Nursing note and vitals reviewed. Eyes: EOM are normal. Pupils are equal, round, and reactive to light.  Left lower lid with mild swelling and erythema, small amt of purulent exudate.  Minor scleral redness. No periorbital findings.   Neurological: She is alert.  Skin: Skin is warm and dry.    ED Course  Procedures (including critical care time) Labs Review Labs Reviewed - No data to display Imaging Review No results found.   MDM   1. Left conjunctivitis     Polytrim as directed Warm compresses No daycare tomorrow See PCP as needed or not better in 2 d    Janne Napoleon, NP 07/25/13 1847

## 2013-07-25 NOTE — Discharge Instructions (Signed)

## 2013-08-23 ENCOUNTER — Telehealth: Payer: Self-pay | Admitting: *Deleted

## 2013-08-23 ENCOUNTER — Ambulatory Visit (INDEPENDENT_AMBULATORY_CARE_PROVIDER_SITE_OTHER): Payer: Medicaid Other | Admitting: Family

## 2013-08-23 ENCOUNTER — Ambulatory Visit (HOSPITAL_COMMUNITY)
Admission: RE | Admit: 2013-08-23 | Discharge: 2013-08-23 | Disposition: A | Discharge status: Home / Self Care | Payer: Medicaid Other | Source: Ambulatory Visit | Attending: Family | Admitting: Family

## 2013-08-23 ENCOUNTER — Encounter: Payer: Self-pay | Admitting: Family

## 2013-08-23 ENCOUNTER — Telehealth: Payer: Self-pay | Admitting: Family

## 2013-08-23 VITALS — BP 110/70 | HR 74

## 2013-08-23 DIAGNOSIS — G40909 Epilepsy, unspecified, not intractable, without status epilepticus: Secondary | ICD-10-CM

## 2013-08-23 DIAGNOSIS — R1310 Dysphagia, unspecified: Secondary | ICD-10-CM | POA: Insufficient documentation

## 2013-08-23 DIAGNOSIS — G40319 Generalized idiopathic epilepsy and epileptic syndromes, intractable, without status epilepticus: Secondary | ICD-10-CM

## 2013-08-23 DIAGNOSIS — F73 Profound intellectual disabilities: Secondary | ICD-10-CM | POA: Insufficient documentation

## 2013-08-23 DIAGNOSIS — G40209 Localization-related (focal) (partial) symptomatic epilepsy and epileptic syndromes with complex partial seizures, not intractable, without status epilepticus: Secondary | ICD-10-CM

## 2013-08-23 DIAGNOSIS — G809 Cerebral palsy, unspecified: Secondary | ICD-10-CM | POA: Insufficient documentation

## 2013-08-23 DIAGNOSIS — F840 Autistic disorder: Secondary | ICD-10-CM | POA: Diagnosis not present

## 2013-08-23 DIAGNOSIS — R569 Unspecified convulsions: Secondary | ICD-10-CM

## 2013-08-23 DIAGNOSIS — H47619 Cortical blindness, unspecified side of brain: Secondary | ICD-10-CM | POA: Insufficient documentation

## 2013-08-23 MED ORDER — LEVETIRACETAM 100 MG/ML PO SOLN: ORAL | Status: DC

## 2013-08-23 NOTE — Progress Notes (Signed)
Patient: Megan [name] MRN: [id number] Sex: female DOB: October 22, 1980  Provider: Rockwell Germany, NP Location of Care: [facility]  Note type: Urgent return visit  History of Present Illness: Referral Source: [place] History from: patient's grandmother and cousin Chief Complaint: Seizures  Megan [name] is a 33 y.o. woman with history of history of intractable minor motor,and generalized tonic-clonic seizure activity, profound mental retardation, autistic behaviors,dysphagia with PEG tube in place, and cortical blindness. She was on Coumadin briefly in 2009. She requires total care for all activities of daily living. She has had no tonic-clonic seizures in over 3 years according to her family. She returns today on an urgent basis because her grandmother called to report that the day program had called her to pick up Megan [name] today because she was having repetitive seizures. Her grandmother said that she was told that she had 13 seizures at school, in which she raised her arms, her eyelids fluttered and she shook slightly. The seizures lasted between 3 and 10 seconds each. Megan [name] had one seizure after arriving in the exam room that her family witnessed, and then I witnessed one while I was in the room with her. With the seizure, Megan [name] had been moving her head and vocalizing, when she abruptly turned her head to the right, her eyes deviated up and to the right and her eyelids fluttered. She raised her arms slightly and they shook slightly. The seizure lasted about 10 seconds, and she immediately returned to vocalization and looking toward her grandmother.   Megan [name]'s grandmother and her cousin who helps to take care of her says that she has been healthy since last seen, and that she has not missed any doses of medication. The day program reported an 8 minute seizure in March and Megan [name] had labs drawn that revealed a Depakote level of 104.7 mcg/ml, a Primidone level of 7.1 mg/L and  a Phenobarbital level of 12.0 mg/L. No changes were made in her medication doses at that time, because there was some doubt that she had a seizure versus behaviors that were interpreted as a seizure because Megan [name] was not noted to be post-ictal after the event.   Review of Systems: 12 system review was remarkable for seizures  Past Medical History  Diagnosis Date  . Cerebral palsy, quadriplegic   . Cortical blindness   . Seizures   . Mental retardation   . Cerebral palsy   . Hypertension 06/23/2010  . Vision abnormalities   . Allergic rhinitis 11/04/2012  . CHF (congestive heart failure)   . COPD (chronic obstructive pulmonary disease)   . Pneumonia December 2014    Was intubated, [facility]  . Dilantin toxicity August 2014   Hospitalizations: yes, Head Injury: no, Nervous System Infections: no, Immunizations up to date: yes Past Medical History Comments: The patient was hospitalized at [name] in December 2014 for pneumonia.  Surgical History Past Surgical History  Procedure Laterality Date  . Peg tube placement    . Peg placement  09/03/2011    Procedure: PERCUTANEOUS ENDOSCOPIC GASTROSTOMY (PEG) REPLACEMENT;  Surgeon: Jerene Bears, MD;  [facility];  Service: Gastroenterology;  Laterality: N/A;  . Peg placement  02/19/2012    Procedure: PERCUTANEOUS ENDOSCOPIC GASTROSTOMY (PEG) REPLACEMENT;  Surgeon: Lafayette Dragon, MD;  [facility];  Service: Endoscopy;  Laterality: N/A;  needs gastrostomy button 24 Fr 4.4 and 3.5 available  . Peg placement N/A 05/30/2013    Procedure: PERCUTANEOUS ENDOSCOPIC GASTROSTOMY (PEG) REPLACEMENT;  Surgeon: Jerene Bears, MD;  Location: [place];  Service: Gastroenterology;  Laterality: N/A;  . Peg placement N/A 07/03/2013    Procedure: PERCUTANEOUS ENDOSCOPIC GASTROSTOMY (PEG) REPLACEMENT;  Surgeon: Lafayette Dragon, MD;  [facility];  Service: Endoscopy;  Laterality: N/A;  Replace with 20 Fr x 3.5 cm mini button-    Family  History family history includes Cancer in her maternal grandfather; Diabetes in her maternal aunt, maternal grandmother, and mother; Hypertension in her other; Stroke in her maternal grandmother. Family History is otherwise negative for migraines, seizures, cognitive impairment, blindness, deafness, birth defects, chromosomal disorder, autism.  Social History History   Social History  . Marital Status: Single    Spouse Name: N/A    Number of Children: N/A  . Years of Education: N/A   Social History Main Topics  . Smoking status: Passive Smoke Exposure - Never Smoker  . Smokeless tobacco: Never Used  . Alcohol Use: No  . Drug Use: No  . Sexual Activity: No   Other Topics Concern  . None   Social History Narrative  . None   Educational level: special education School Attending:[employer] Living with:  grandmother  Hobbies/Interest: Music and reading School comments:  Megan [name] is currently attending [employer]  Physical Exam BP 110/70  Pulse 74 General: Well developed young woman, seated in wheelchair  Head: Normocephalic and atraumatic  Ears, Nose and Throat: Tympanic membranes partially occluded by cerumen bilaterally. Unable to fully examine her posterior pharynx due to patient's inability to cooperate. Her tongue protrudes.  Neck: supple, unable to maintain head control  Respiratory: lungs clear to auscultation  Cardiovascular: regular rate and rhythm. No murmurs  Musculoskeletal: contractures of extremities at the knees and elbows. she has hypotonia in her trunk  Skin: intact  Trunk: PEG tube intact   Neurologic Exam  Mental Status: Awake and vocalizing at times. She smiles at times when her family interacts with her. Evidence of profound intellectual delay. She has no speech. Cranial Nerves: Fundoscopic exam reveals poorly visualized disc margins. Pupils equal, briskly reactive to light. Unable to evaluate EOM's due to her inability to follow  instructions. She has dysconjugate eye movements. Does not fix and follow on objects in her visual field. She turns to localize sounds but does not do so consistently. She has lower facial weakness with drooling. Her tongue protrudes at all times. Unable to adequately evaluate neck flexion and extension.  Motor: She has spastic quadriplegia  Sensory: Withdraws to noxious stimuli  Coordination: Cannot cooperate with exam  Gait and Station: wheelchair bound, does not ambulate  Reflexes: diminished throughout   Assessment and [place] is a 33 year old woman with history of intractable minor motor,and generalized tonic-clonic seizure activity, profound mental retardation, autistic behaviors,dysphagia with PEG tube in place, and cortical blindness. Megan [name] was seen urgently today because she had a flurry of partial seizures at her day program. I consulted with [name] regarding this patient. He came and talked with her family. We will increase her Levetiracetam dose and will perform an EEG to evaluate for any changes in her condition. I will call her grandmother when the EEG has been read. If Megan [name] continues to have seizures or is intolerant to the dose increase, we will need to consider addition of other medications such as Banzel or Vimpat. Megan [name]'s grandmother agreed with these plans.

## 2013-08-23 NOTE — Telephone Encounter (Signed)
I agreed to see patient today at 2:15pm. I will consult with [name] at the time of the appointment. TG

## 2013-08-23 NOTE — Patient Instructions (Addendum)
Increase the Levetiracetam dose to 12.71ml in the morning and 12.79ml in the evening. Call me if she becomes sleepy or has any problems with this dose.  I have updated her prescription for this medication at the pharmacy. Continue the other medications without change. I have scheduled an EEG for Megan [name] at [facility] for tomorrow, August 24, 2013 at [facility]. [name] or I will call you with the results and any instructions.  I have written a letter for her school. Let me know if you need anything else.  Please plan to return in a month so that we can see how she is doing. Call me sooner if you have any concerns.

## 2013-08-23 NOTE — Telephone Encounter (Signed)
[name] read [phone] EEG this afternoon. I called her grandmother to give her results but was unable to reach her. I left a message for grandmother and told her that I would call her tomorrow. TG

## 2013-08-23 NOTE — Telephone Encounter (Signed)
At 11:57 am, [phone]), the cousin, called about the pt having mini seizures at her day program, [employer], today. She stated that the pt had her arms straight up in the air. She said that the pt was put on new medication and  She stated to call the grandmother of the pt. I called [name], the grandmother, and asked her about the pt. She said that she was called by the nurse at her day program and told her about her having mini seizures. Dorothy wanted to know if she can be seen today by [place].

## 2013-08-23 NOTE — Progress Notes (Signed)
EEG Completed; Results Pending  

## 2013-08-23 NOTE — Telephone Encounter (Signed)
Seen with Otila Kluver, EEG performed, read, Otila Kluver will call results.  Levetiracetam was increased.

## 2013-08-24 ENCOUNTER — Ambulatory Visit (HOSPITAL_COMMUNITY): Payer: Medicaid Other

## 2013-08-24 NOTE — Telephone Encounter (Signed)
I left a message for Mom and asked her to return my call so that I could give her the EEG results. TG

## 2013-08-24 NOTE — Procedures (Signed)
EEG NUMBER:  [id number].  CLINICAL HISTORY:  The patient is a 33 year old female with history of intractable minor motor and generalized tonic-clonic seizures, profound mental retardation, autism, pervasive developmental disorder, dysphagia, and cortical blindness.  The patient had a recent history of Dilantin toxicity and was switched to levetiracetam with improved seizure control.  Recently, she has had clusters of seizures characterized by briefly elevating her head and lifting her arms up into the air.  She also has intermittent facial twitching.  Study is being done to look for the presence of a seizure focus (345.01, 345.41).  Recording time 32-1/2 minutes.  PROCEDURE:  The tracing was carried out on a 32-channel digital Cadwell recorder, reformatted into 16-channel montages with 1 devoted to EKG. The patient was awake during the recording.  The international 10/20 system lead placement was used.  MEDICATIONS:  Fluticasone, levetiracetam, levothyroxine, loratadine, MiraLax, primidone, ranitidine, and valproic acid.  DESCRIPTION OF FINDINGS:  Background activity shows low voltage, diffuse beta and 35-microvolt 7 Hz theta range activity that is present from time to time.  The record is monotonous.  The patient does not change state of arousal.  Throughout the record, there was no interictal epileptiform activity in the form of spikes or sharp waves.  There were no electrographic seizures.  EKG showed regular sinus rhythm with ventricular response of 84 beats per minute.  IMPRESSION:  Abnormal EEG on the basis of low-voltage slowing.  This is a nonspecific indicator of neuronal dysfunction consistent with the patient's static encephalopathy.  The record provides no additional evidence for her seizure disorder.     Princess Bruins. Gaynell Face, M.D.    BLT:JQZE D:  08/23/2013 20:33:44  T:  08/24/2013 03:31:07  Job #:  [id number]

## 2013-08-25 ENCOUNTER — Ambulatory Visit: Payer: Medicaid Other | Admitting: Family

## 2013-08-25 NOTE — Telephone Encounter (Signed)
I called and spoke with [name]'s cousin, Edwena Blow, and asked her to let Mrs. Young know that the EEG did not show seizure activity, and was unchanged from prior studies. She said that [name] was tolerating the increase in Levetiracetam thus far. I asked her to call if [name] has more seizures and she agreed to do so. TG

## 2013-09-07 ENCOUNTER — Telehealth: Payer: Self-pay | Admitting: *Deleted

## 2013-09-07 DIAGNOSIS — G40209 Localization-related (focal) (partial) symptomatic epilepsy and epileptic syndromes with complex partial seizures, not intractable, without status epilepticus: Secondary | ICD-10-CM

## 2013-09-07 DIAGNOSIS — G40319 Generalized idiopathic epilepsy and epileptic syndromes, intractable, without status epilepticus: Secondary | ICD-10-CM

## 2013-09-07 NOTE — Telephone Encounter (Signed)
Dorothy, grandmother, called today. She received a seizure log from [employer] for the pt. The log had her having seizures 31 times today. They said she was raising her upper arms and they were twitching, going up and down. She said she had a little trouble understanding the log. [name] also stated the pt does not get that amount of seizures at home. She can be reached at [phone].

## 2013-09-07 NOTE — Telephone Encounter (Signed)
I called Megan [name]'s grandmother and talked with her. She said Megan [name] was at home with her now and fine, had not had any seizures or abnormal movements. Grandmother said that she had trouble understanding what they were putting on the log as seizures. She said that she had not had those movements at home before, raising her arms up and down. I told [name] that I would call [facility] and try to talk with the person completing the log. I would like more information before we make changes in her medication, since there is question that the behaviors are seizure activity. She agreed with this plan. TG

## 2013-09-08 NOTE — Telephone Encounter (Signed)
I called and spoke with Holley Raring, the nurse at [employer]. She confirmed what [name]'s grandmother had told me yesterday evening. She said that [name]'s arms go up, they shake slightly, her head turns slightly, she stares and then arms go down. The behaviors last from 3-10 seconds and they are unable to get her attention during the episodes. Afterwards, she closes her eyes, is briefly unresponsive, then seems to awaken and is back to her usual baseline. Holley Raring said that she had more yesterday than previous days (having 31) and that they seemed to occur more before her midday Depakote dose. She said that the arms going up is what triggers the staff to look at her and notice the behavior because it is new behavior for [name] since this started in April. Holley Raring has not seen improvement in the seizures since the Levetiracetam dose was increased at the April 29th office visit. She had an EEG on April 29th that showed slowing but no seizures. [name] is on Levetiracetam 100mg /ml - 12.32ml BID, Mysoline 250mg  BID, Depakene 250mg /29ml - 46ml q 6 hours. She used to take Dilantin but it was stopped in December at [facility] due to toxicity. Her last trough levels were on July 17, 2013 and revealed Depakote level of 104.7 mcg/ml and  Primidone 7.1mg /L and Phenobarbital 12.0mg /L.   When she was seen April 29th, we decided to increase Levetiracetam dose and if that did not control the seizures, to consider another medication such as Banzel or Vimpat. [name], how would you like to proceed? Otila Kluver

## 2013-09-08 NOTE — Telephone Encounter (Signed)
I spoke with Megan [name] and recommended tapering and discontinuing Mysoline and starting Vimpat at a dose of 50 mg twice daily.

## 2013-09-08 NOTE — Telephone Encounter (Signed)
I called [name]'s grandmother, Mrs. Annamaria Boots. I told her the plan of stopping Mysoline and starting Vimpat. To minimize confusion, I asked her to stop by the office on Monday 09/11/13 to go over instructions in person. She will come back end of day on Monday. TG

## 2013-09-11 MED ORDER — VIMPAT 10 MG/ML PO SOLN: ORAL | Status: DC

## 2013-09-11 MED ORDER — LACOSAMIDE 10 MG/ML PO SOLN: ORAL | Status: DC

## 2013-09-11 NOTE — Addendum Note (Signed)
Addended by: Joelyn Oms on: 09/11/2013 03:05 PM   Modules accepted: Orders, Medications

## 2013-09-12 ENCOUNTER — Telehealth: Payer: Self-pay | Admitting: *Deleted

## 2013-09-12 NOTE — Telephone Encounter (Signed)
See phone note from today. Grandmother called today saying that she was unable to come to office yesterday but had picked up the Vimpat and had started it. She knows to stop the Mysoline. TG

## 2013-09-12 NOTE — Telephone Encounter (Signed)
Megan [name] called today. She stated that the school sent her a log of the pt having 10 seizures, all lasting under 10 seconds. She states that pt does not have the attacks at home. Megan [name] saw the pt raising her arms and does a jerky movement and then stops. She stated that the new medication is helping Megan [name]. She wants to know if something is triggering her seizures at the day program or is it muscle spasms? [name] can be reached at [phone]

## 2013-09-12 NOTE — Telephone Encounter (Signed)
I called and talked to Megan [name]. She said that she had started the Vimpat and was tolerating it well. She said that she does not have the episodes at home that they report in school. I told her grandmother that we needed to continue the medication and that I would call and talk with the school nurse tomorrow. TG

## 2013-09-26 NOTE — Telephone Encounter (Signed)
I have been unable to reach the nurse at [employer]. I will try her again later this week. TG

## 2013-09-29 NOTE — Telephone Encounter (Signed)
I was able to reach an [employer] nurse today who said that the seizures had decreased dramatically in since being on Vimpat. She estimated that Megan [name] would have 1 or 2 behaviors per day that she thought would be seizure activity, and the behaviors were very brief, lasting only a few seconds. She said that Megan [name] was much more awake and alert overall. I asked her to let me know if she saw increase in seizure behaviors, and she agreed. TG

## 2013-10-31 ENCOUNTER — Encounter: Payer: Self-pay | Admitting: Family

## 2013-10-31 ENCOUNTER — Ambulatory Visit (INDEPENDENT_AMBULATORY_CARE_PROVIDER_SITE_OTHER): Payer: Medicaid Other | Admitting: Family

## 2013-10-31 VITALS — BP 114/72 | HR 76

## 2013-10-31 DIAGNOSIS — G40209 Localization-related (focal) (partial) symptomatic epilepsy and epileptic syndromes with complex partial seizures, not intractable, without status epilepticus: Secondary | ICD-10-CM

## 2013-10-31 DIAGNOSIS — G809 Cerebral palsy, unspecified: Secondary | ICD-10-CM

## 2013-10-31 DIAGNOSIS — Z79899 Other long term (current) drug therapy: Secondary | ICD-10-CM

## 2013-10-31 DIAGNOSIS — G40319 Generalized idiopathic epilepsy and epileptic syndromes, intractable, without status epilepticus: Secondary | ICD-10-CM

## 2013-10-31 NOTE — Patient Instructions (Signed)
Continue Megan [name]'s medications without change for now.  I have written a note to [employer] asking them to videotape the behaviors they are seeing. If I receive a video, I will let you know if we need to change her treatment plan.  Let me know if you see an increase in Megan [name]'s seizures Please plan to return for follow up in 4 months or sooner if needed.

## 2013-10-31 NOTE — Progress Notes (Signed)
Patient: Megan [name] MRN: [id number] Sex: female DOB: 1980-08-18  Provider: Rockwell Germany, NP Location of Care: [facility]  Note type: Routine return visit  History of Present Illness: Referral Source: [place] History from: her grandmother and her cousin Chief Complaint: Seizures  Megan [name] is a 33 y.o. woman with history of intractable minor motor,and generalized tonic-clonic seizure activity, profound mental retardation, autistic behaviors,dysphagia with PEG tube in place, and cortical blindness. She was on Coumadin briefly in 2009. She requires total care for all activities of daily living. She has had no tonic-clonic seizures in over 3 years according to her family. She was last seen August 23, 2013 the day program that she attends had called her grandmother to pick up Megan [name] because she was having repetitive seizures. Her grandmother was told that she had 13 seizures at school, in which she raised her arms, her eyelids fluttered and she shook slightly. The seizures lasted between 3 and 10 seconds each. Megan [name] had one seizure after arriving in the exam room that her family witnessed, and then I witnessed one while I was in the room with her. With the seizure, Megan [name] had been moving her head and vocalizing, when she abruptly turned her head to the right, her eyes deviated up and to the right and her eyelids fluttered. She raised her arms slightly and they shook slightly. The seizure lasted about 10 seconds, and she immediately returned to vocalization and looking toward her grandmother. We increased the Levetiracetam dose that day, and I followed up with the day program later by phone, who told me that the behaviors had significantly lessened.   Megan [name]'s grandmother tells me today that she is told by the day program that Megan [name] continues to exhibit about 10 or so seizures per day, in which she raises her arms and shakes slightly. There is no report of  eyelid fluttering, eye deviation, head turning or change in facial expression. Her grandmother says that she has also been told that Megan [name] occasionally "shouts", something that Megan [name] has never been known to do. She will occasionally laugh, but it is quiet. Grandmother and Megan [name]'s cousin, who is also a caregiver, say that they have never seen these behaviors at home in the evening or on weekends. They have seen her lift her arms slightly but not the behaviors that the day program describes.   Megan [name]'s grandmother and her cousin who helps to take care of her says that she has been healthy since last seen, and that she has not missed any doses of medication. She has been coughing some for the last few days but has not had fever, nasal discharge or level of alertness. They are concerned about pneumonia since she had hospital stay for that last winter.   Review of Systems: 12 system review was remarkable for coughing  Past Medical History  Diagnosis Date  . Cerebral palsy, quadriplegic   . Cortical blindness   . Seizures   . Mental retardation   . Cerebral palsy   . Hypertension 06/23/2010  . Vision abnormalities   . Allergic rhinitis 11/04/2012  . CHF (congestive heart failure)   . COPD (chronic obstructive pulmonary disease)   . Pneumonia December 2014    Was intubated, Megan [name]  . Dilantin toxicity August 2014   Hospitalizations: No., Head Injury: No., Nervous System Infections: No., Immunizations up to date: Yes Past Medical History Comments: see Hx.  Surgical History Past Surgical History  Procedure Laterality Date  .  Peg tube placement    . Peg placement  09/03/2011    Procedure: PERCUTANEOUS ENDOSCOPIC GASTROSTOMY (PEG) REPLACEMENT;  Surgeon: Megan Bears, MD;  Location: [facility];  Service: Gastroenterology;  Laterality: N/A;  . Peg placement  02/19/2012    Procedure: PERCUTANEOUS ENDOSCOPIC GASTROSTOMY (PEG) REPLACEMENT;  Surgeon: Megan Dragon, MD;  Location: [facility];  Service:  Endoscopy;  Laterality: N/A;  needs gastrostomy button 24 Fr 4.4 and 3.5 available  . Peg placement N/A 05/30/2013    Procedure: PERCUTANEOUS ENDOSCOPIC GASTROSTOMY (PEG) REPLACEMENT;  Surgeon: Megan Bears, MD;  Location: [facility];  Service: Gastroenterology;  Laterality: N/A;  . Peg placement N/A 07/03/2013    Procedure: PERCUTANEOUS ENDOSCOPIC GASTROSTOMY (PEG) REPLACEMENT;  Surgeon: Megan Dragon, MD;  Location: [facility];  Service: Endoscopy;  Laterality: N/A;  Replace with 20 Fr x 3.5 cm mini button-    Family History family history includes Cancer in her maternal grandfather; Diabetes in her maternal aunt, maternal grandmother, and mother; Hypertension in her other; Stroke in her maternal grandmother. Family History is otherwise negative for migraines, seizures, cognitive impairment, blindness, deafness, birth defects, chromosomal disorder, autism.  Social History History   Social History  . Marital Status: Single    Spouse Name: N/A    Number of Children: N/A  . Years of Education: N/A   Social History Main Topics  . Smoking status: Passive Smoke Exposure - Never Smoker  . Smokeless tobacco: Never Used  . Alcohol Use: No  . Drug Use: No  . Sexual Activity: No   Other Topics Concern  . None   Social History Narrative  . None   Educational level: special education School Attending:[employer] Living with:  grandmother  Hobbies/Interest: Megan [name] and music School comments:  Megan [name] is doing well in [employer].  Physical Exam BP 114/72  Pulse 76  LMP 10/16/2013 General: Well developed young woman, seated in wheelchair  Head: Normocephalic and atraumatic  Ears, Nose and Throat: Tympanic membranes partially occluded by cerumen bilaterally. Unable to fully examine her posterior pharynx due to patient's inability to cooperate. Her tongue protrudes.  Neck: supple, unable to maintain head control  Respiratory: lungs clear to auscultation  Cardiovascular:  regular rate and rhythm. No murmurs  Musculoskeletal: contractures of extremities at the knees and elbows. she has hypotonia in her trunk  Skin: intact, no rashes or lesions Trunk: PEG tube intact   Neurologic Exam  Mental Status: Awake. She smiles at times when her family interacts with her. Evidence of profound intellectual delay. She has no speech.  Cranial Nerves: Fundoscopic exam reveals poorly visualized disc margins. Pupils equal, briskly reactive to light. Unable to evaluate EOM's due to her inability to follow instructions. She has dysconjugate eye movements. Does not fix and follow on objects in her visual field. She turns to localize sounds but does not do so consistently. She has lower facial weakness with drooling. Her tongue protrudes at all times. Unable to adequately evaluate neck flexion and extension.  Motor: She has spastic quadriplegia  Sensory: Withdraws to noxious stimuli  Coordination: Cannot cooperate with exam  Gait and Station: wheelchair bound, does not ambulate  Reflexes: diminished throughout   Assessment and [place] is a 33 year old woman with history of intractable minor motor,and generalized tonic-clonic seizure activity, profound mental retardation, autistic behaviors,dysphagia with PEG tube in place, and cortical blindness. Megan [name]'s day program continues to report daily seizure activity that the family does not see at home.  I wrote a letter requesting that they video the behavior so that we could see and evaluate what they are seeing at the program. I will await their response. Grandmother agreed with this plan.

## 2013-11-01 ENCOUNTER — Encounter: Payer: Self-pay | Admitting: Family

## 2013-12-10 ENCOUNTER — Other Ambulatory Visit: Payer: Self-pay | Admitting: Family Medicine

## 2013-12-11 ENCOUNTER — Other Ambulatory Visit: Payer: Self-pay | Admitting: *Deleted

## 2013-12-12 MED ORDER — RANITIDINE HCL 75 MG/5ML PO SYRP: 75.0000 mg | ORAL_SOLUTION | Freq: Two times a day (BID) | ORAL | Status: DC

## 2013-12-18 ENCOUNTER — Telehealth: Payer: Self-pay | Admitting: Family

## 2013-12-18 DIAGNOSIS — G40319 Generalized idiopathic epilepsy and epileptic syndromes, intractable, without status epilepticus: Secondary | ICD-10-CM

## 2013-12-18 DIAGNOSIS — G40209 Localization-related (focal) (partial) symptomatic epilepsy and epileptic syndromes with complex partial seizures, not intractable, without status epilepticus: Secondary | ICD-10-CM

## 2013-12-18 NOTE — Telephone Encounter (Signed)
Lemont Fillers called for patient's grandmother [name], and left message saying that she was called by the school and a note sent home that [place] had 12 partial seizures and 4 full seizures today. She asked to be called at [phone] I called her back and left a message. I discussed this case with [name]. I will call [facility] tomorrow and see if I can arrange an ambulatory EEG for [facility] to be done both at home and at school. TG

## 2013-12-19 NOTE — Telephone Encounter (Signed)
I called again today and left a message asking for family member to call me back. TG

## 2013-12-21 NOTE — Telephone Encounter (Signed)
I called and spoke with Mrs. Young. I explained that we wanted to do ambulatory EEG, with [facility] wearing the device to day program. She agreed to this plan. TG

## 2014-01-11 ENCOUNTER — Emergency Department (HOSPITAL_COMMUNITY): Payer: Medicaid Other

## 2014-01-11 ENCOUNTER — Telehealth: Payer: Self-pay | Admitting: *Deleted

## 2014-01-11 ENCOUNTER — Inpatient Hospital Stay (HOSPITAL_COMMUNITY)
Admission: EM | Admit: 2014-01-11 | Discharge: 2014-01-13 | DRG: 100 | Disposition: A | Discharge status: Home / Self Care | Payer: Medicaid Other | Attending: Family Medicine | Admitting: Family Medicine

## 2014-01-11 ENCOUNTER — Encounter (HOSPITAL_COMMUNITY): Payer: Self-pay | Admitting: Emergency Medicine

## 2014-01-11 DIAGNOSIS — G9340 Encephalopathy, unspecified: Secondary | ICD-10-CM | POA: Diagnosis present

## 2014-01-11 DIAGNOSIS — F73 Profound intellectual disabilities: Secondary | ICD-10-CM | POA: Diagnosis present

## 2014-01-11 DIAGNOSIS — I10 Essential (primary) hypertension: Secondary | ICD-10-CM | POA: Diagnosis present

## 2014-01-11 DIAGNOSIS — I509 Heart failure, unspecified: Secondary | ICD-10-CM | POA: Diagnosis present

## 2014-01-11 DIAGNOSIS — Z931 Gastrostomy status: Secondary | ICD-10-CM | POA: Diagnosis not present

## 2014-01-11 DIAGNOSIS — G808 Other cerebral palsy: Secondary | ICD-10-CM | POA: Diagnosis present

## 2014-01-11 DIAGNOSIS — J449 Chronic obstructive pulmonary disease, unspecified: Secondary | ICD-10-CM | POA: Diagnosis present

## 2014-01-11 DIAGNOSIS — G809 Cerebral palsy, unspecified: Secondary | ICD-10-CM | POA: Diagnosis present

## 2014-01-11 DIAGNOSIS — Z79899 Other long term (current) drug therapy: Secondary | ICD-10-CM

## 2014-01-11 DIAGNOSIS — R1310 Dysphagia, unspecified: Secondary | ICD-10-CM | POA: Diagnosis present

## 2014-01-11 DIAGNOSIS — G40802 Other epilepsy, not intractable, without status epilepticus: Principal | ICD-10-CM | POA: Diagnosis present

## 2014-01-11 DIAGNOSIS — E039 Hypothyroidism, unspecified: Secondary | ICD-10-CM | POA: Diagnosis present

## 2014-01-11 DIAGNOSIS — F72 Severe intellectual disabilities: Secondary | ICD-10-CM

## 2014-01-11 DIAGNOSIS — G40209 Localization-related (focal) (partial) symptomatic epilepsy and epileptic syndromes with complex partial seizures, not intractable, without status epilepticus: Secondary | ICD-10-CM

## 2014-01-11 DIAGNOSIS — G40319 Generalized idiopathic epilepsy and epileptic syndromes, intractable, without status epilepticus: Secondary | ICD-10-CM

## 2014-01-11 DIAGNOSIS — E162 Hypoglycemia, unspecified: Secondary | ICD-10-CM | POA: Diagnosis present

## 2014-01-11 DIAGNOSIS — J4489 Other specified chronic obstructive pulmonary disease: Secondary | ICD-10-CM | POA: Diagnosis present

## 2014-01-11 DIAGNOSIS — H47619 Cortical blindness, unspecified side of brain: Secondary | ICD-10-CM | POA: Diagnosis present

## 2014-01-11 DIAGNOSIS — G40909 Epilepsy, unspecified, not intractable, without status epilepticus: Secondary | ICD-10-CM

## 2014-01-11 DIAGNOSIS — H543 Unqualified visual loss, both eyes: Secondary | ICD-10-CM

## 2014-01-11 DIAGNOSIS — E038 Other specified hypothyroidism: Secondary | ICD-10-CM

## 2014-01-11 LAB — URINALYSIS, ROUTINE W REFLEX MICROSCOPIC
Bilirubin Urine: NEGATIVE
Glucose, UA: NEGATIVE mg/dL
Hgb urine dipstick: NEGATIVE
Ketones, ur: NEGATIVE mg/dL
LEUKOCYTES UA: NEGATIVE
Nitrite: NEGATIVE
PH: 8 (ref 5.0–8.0)
Protein, ur: NEGATIVE mg/dL
Specific Gravity, Urine: 1.015 (ref 1.005–1.030)
Urobilinogen, UA: 0.2 mg/dL (ref 0.0–1.0)

## 2014-01-11 LAB — CBC WITH DIFFERENTIAL/PLATELET
BASOS ABS: 0 10*3/uL (ref 0.0–0.1)
Basophils Relative: 0 % (ref 0–1)
EOS PCT: 1 % (ref 0–5)
Eosinophils Absolute: 0.1 10*3/uL (ref 0.0–0.7)
HCT: 36.9 % (ref 36.0–46.0)
Hemoglobin: 12.2 g/dL (ref 12.0–15.0)
LYMPHS ABS: 1.8 10*3/uL (ref 0.7–4.0)
Lymphocytes Relative: 28 % (ref 12–46)
MCH: 33 pg (ref 26.0–34.0)
MCHC: 33.1 g/dL (ref 30.0–36.0)
MCV: 99.7 fL (ref 78.0–100.0)
Monocytes Absolute: 0.8 10*3/uL (ref 0.1–1.0)
Monocytes Relative: 12 % (ref 3–12)
NEUTROS ABS: 3.8 10*3/uL (ref 1.7–7.7)
NEUTROS PCT: 59 % (ref 43–77)
Platelets: 207 10*3/uL (ref 150–400)
RBC: 3.7 MIL/uL — ABNORMAL LOW (ref 3.87–5.11)
RDW: 18 % — AB (ref 11.5–15.5)
WBC: 6.4 10*3/uL (ref 4.0–10.5)

## 2014-01-11 LAB — BASIC METABOLIC PANEL
ANION GAP: 10 (ref 5–15)
BUN: 12 mg/dL (ref 6–23)
CALCIUM: 9 mg/dL (ref 8.4–10.5)
CHLORIDE: 101 meq/L (ref 96–112)
CO2: 31 meq/L (ref 19–32)
Creatinine, Ser: 0.61 mg/dL (ref 0.50–1.10)
GFR calc Af Amer: >90 mL/min (ref >90)
GFR calc non Af Amer: >90 mL/min (ref >90)
GLUCOSE: 64 mg/dL — AB (ref 70–99)
POTASSIUM: 4.8 meq/L (ref 3.7–5.3)
SODIUM: 142 meq/L (ref 137–147)

## 2014-01-11 LAB — VALPROIC ACID LEVEL: VALPROIC ACID LVL: 106.1 ug/mL — AB (ref 50.0–100.0)

## 2014-01-11 LAB — CBG MONITORING, ED
Glucose-Capillary: 57 mg/dL — ABNORMAL LOW (ref 70–99)
Glucose-Capillary: 79 mg/dL (ref 70–99)
Glucose-Capillary: 74 mg/dL (ref 70–99)

## 2014-01-11 LAB — TSH: TSH: 0.488 u[IU]/mL (ref 0.350–4.500)

## 2014-01-11 MED ORDER — LEVETIRACETAM 100 MG/ML PO SOLN
1250.0000 mg | Freq: Two times a day (BID) | ORAL | Status: DC
  Administered 2014-01-11 – 2014-01-13 (×4): 1250 mg
  Filled 2014-01-11 (×6): qty 15

## 2014-01-11 MED ORDER — SODIUM CHLORIDE 0.9 % IJ SOLN
3.0000 mL | Freq: Two times a day (BID) | INTRAMUSCULAR | Status: DC
  Administered 2014-01-12 – 2014-01-13 (×3): 3 mL via INTRAVENOUS

## 2014-01-11 MED ORDER — SODIUM CHLORIDE 0.9 % IV SOLN: 50.0000 mg | Freq: Two times a day (BID) | INTRAVENOUS | Status: DC

## 2014-01-11 MED ORDER — LORATADINE 5 MG/5ML PO SYRP
10.0000 mg | ORAL_SOLUTION | Freq: Every day | ORAL | Status: DC
  Administered 2014-01-12 – 2014-01-13 (×2): 10 mg
  Filled 2014-01-11 (×4): qty 10

## 2014-01-11 MED ORDER — PRIMIDONE 250 MG PO TABS
125.0000 mg | ORAL_TABLET | Freq: Two times a day (BID) | ORAL | Status: DC
  Filled 2014-01-11: qty 1

## 2014-01-11 MED ORDER — ACETAMINOPHEN 325 MG PO TABS
650.0000 mg | ORAL_TABLET | Freq: Four times a day (QID) | ORAL | Status: DC | PRN
Start: 2014-01-11 — End: 2014-01-13

## 2014-01-11 MED ORDER — LORAZEPAM 2 MG/ML IJ SOLN
2.0000 mg | Freq: Once | INTRAMUSCULAR | Status: AC
  Administered 2014-01-11: 2 mg via INTRAVENOUS
  Filled 2014-01-11: qty 1

## 2014-01-11 MED ORDER — ENOXAPARIN SODIUM 40 MG/0.4ML ~~LOC~~ SOLN
40.0000 mg | SUBCUTANEOUS | Status: DC
Start: 2014-01-11 — End: 2014-01-13
  Administered 2014-01-11 – 2014-01-12 (×2): 40 mg via SUBCUTANEOUS
  Filled 2014-01-11 (×2): qty 0.4

## 2014-01-11 MED ORDER — LACOSAMIDE 50 MG PO TABS
50.0000 mg | ORAL_TABLET | Freq: Two times a day (BID) | ORAL | Status: DC
  Administered 2014-01-11 – 2014-01-13 (×4): 50 mg
  Filled 2014-01-11 (×4): qty 1

## 2014-01-11 MED ORDER — POLYMYXIN B-TRIMETHOPRIM 10000-0.1 UNIT/ML-% OP SOLN
2.0000 [drp] | OPHTHALMIC | Status: DC
  Administered 2014-01-12 – 2014-01-13 (×10): 2 [drp] via OPHTHALMIC
  Filled 2014-01-11: qty 10

## 2014-01-11 MED ORDER — SODIUM CHLORIDE 0.9 % IV BOLUS (SEPSIS)
500.0000 mL | Freq: Once | INTRAVENOUS | Status: AC
Start: 2014-01-11 — End: 2014-01-11
  Administered 2014-01-11: 500 mL via INTRAVENOUS

## 2014-01-11 MED ORDER — DEXTROSE-NACL 5-0.45 % IV SOLN
INTRAVENOUS | Status: DC
  Administered 2014-01-12 (×2): via INTRAVENOUS

## 2014-01-11 MED ORDER — ACETAMINOPHEN 650 MG RE SUPP: 650.0000 mg | Freq: Four times a day (QID) | RECTAL | Status: DC | PRN

## 2014-01-11 MED ORDER — VALPROATE SODIUM 250 MG/5ML PO SYRP
650.0000 mg | ORAL_SOLUTION | Freq: Four times a day (QID) | ORAL | Status: DC
  Administered 2014-01-11 – 2014-01-12 (×2): 650 mg
  Filled 2014-01-11 (×6): qty 13

## 2014-01-11 MED ORDER — RANITIDINE HCL 75 MG/5ML PO SYRP
75.0000 mg | ORAL_SOLUTION | Freq: Two times a day (BID) | ORAL | Status: DC
  Administered 2014-01-11: 75 mg
  Filled 2014-01-11 (×3): qty 5

## 2014-01-11 MED ORDER — ONDANSETRON HCL 4 MG/2ML IJ SOLN: 4.0000 mg | Freq: Four times a day (QID) | INTRAMUSCULAR | Status: DC | PRN

## 2014-01-11 MED ORDER — LORAZEPAM 2 MG/ML IJ SOLN: 1.0000 mg | INTRAMUSCULAR | Status: DC | PRN

## 2014-01-11 MED ORDER — LEVOTHYROXINE SODIUM 88 MCG PO TABS
88.0000 ug | ORAL_TABLET | Freq: Every day | ORAL | Status: DC
  Administered 2014-01-12 – 2014-01-13 (×2): 88 ug
  Filled 2014-01-11 (×2): qty 1

## 2014-01-11 MED ORDER — JEVITY 1.2 CAL PO LIQD
237.0000 mL | Freq: Three times a day (TID) | ORAL | Status: DC
  Administered 2014-01-12 – 2014-01-13 (×5): 237 mL
  Filled 2014-01-11 (×2): qty 237

## 2014-01-11 MED ORDER — JEVITY PO LIQD: 1.0000 | Freq: Three times a day (TID) | ORAL | Status: DC

## 2014-01-11 MED ORDER — DEXTROSE 50 % IV SOLN
25.0000 g | Freq: Once | INTRAVENOUS | Status: AC
  Administered 2014-01-13: 25 g via INTRAVENOUS
  Filled 2014-01-11 (×2): qty 50

## 2014-01-11 MED ORDER — GLUCOSE 40 % PO GEL
ORAL | Status: AC
  Administered 2014-01-11: 37.5 g
  Filled 2014-01-11: qty 1

## 2014-01-11 MED ORDER — ONDANSETRON HCL 4 MG PO TABS: 4.0000 mg | ORAL_TABLET | Freq: Four times a day (QID) | ORAL | Status: DC | PRN

## 2014-01-11 NOTE — ED Notes (Signed)
Internal Medicine paged; agreed pt should go to floor where decision would be made for additional fluid administration

## 2014-01-11 NOTE — Progress Notes (Signed)
FMTS ATTENDING ADMISSION NOTE Megan [name],Megan [name] I  have seen and examined this patient, reviewed their chart. I have discussed this patient with the resident. I agree with the resident's findings, assessment and care plan.  33 Y/O F with PMX of Seizure disorder, cerebral palsy,B/L blindness,hypothyroidism, presented with hx of seizure which started at about 2 am this morning and has been recurrent every few min. As per her grandmother she is compliant with her home seizure medication, a new medication was added to her regimen recently by the neurologist. She is seen by [name] group. In the last few week she has had multiple episodes of brief seizures but this is worse.  No current facility-administered medications on file prior to encounter.   Current Outpatient Prescriptions on File Prior to Encounter  Medication Sig Dispense Refill  . levETIRAcetam (KEPPRA) 100 MG/ML solution Give 12.5 mls in the morning and 12.5 mls in the evening by peg tube  850 mL  5  . levothyroxine (SYNTHROID, LEVOTHROID) 88 MCG tablet Take 1 tablet (88 mcg total) by mouth daily.  30 tablet  11  . loratadine (CLARITIN) 5 MG/5ML syrup Take 10 mLs (10 mg total) by mouth daily.  300 mL  12  . Nutritional Supplements (JEVITY) LIQD 1 Can by Per J Tube route 3 (three) times daily.  90 Can  11  . ranitidine (ZANTAC) 75 MG/5ML syrup 5 mLs (75 mg total) by Per J Tube route 2 (two) times daily.  480 mL  5  . trimethoprim-polymyxin b (POLYTRIM) ophthalmic solution Place 2 drops into the left eye every 4 (four) hours.  10 mL  0  . valproate (DEPAKENE) 250 MG/5ML syrup Give 13 ml by peg tube every 6 hours  1600 mL  5  . VIMPAT 10 MG/ML SOLN Give 86ml in the morning and 58ml in the evening  310 mL  5  . AMBULATORY NON FORMULARY MEDICATION Abdominal binder  1 packet  1   Past Medical History  Diagnosis Date  . Cerebral palsy, quadriplegic   . Cortical blindness   . Seizures   . Mental retardation   . Cerebral palsy   .  Hypertension 06/23/2010  . Vision abnormalities   . Allergic rhinitis 11/04/2012  . CHF (congestive heart failure)   . COPD (chronic obstructive pulmonary disease)   . Pneumonia December 2014    Was intubated, [facility]  . Dilantin toxicity August 2014   Filed Vitals:   01/11/14 1330 01/11/14 1345 01/11/14 1400 01/11/14 1518  BP:   109/79 125/89  Pulse: 101 84 81 97  Resp: 17 14 13 14   Height:      Weight:      SpO2: 100% 100% 100% 100%   Exam: Gen: Sleeping in bed S/P Ativan. Not in distress. Neuro: Could not fully assess since she was sleeping. Easily arousable. Contracted extremities,will reexamine later. Resp: Air entry equal and clear B/L CV: S1 S2 normal no murmurs. Abd: Soft, ND/NT, BS+ Ext: Mildly puffy feet,acyanotic.  A/P: 33 Y/O F with 1. Recurrent seizures: Compliant with home regimen.     Restarted back on Levetiracetam,Primidone, Valproic and Lacosamide.     Serum Valproic slightly supratherapeutic.     I reviewed last EKG report as documented on Epic as well as Dr [employer] phone note today.     72 hr EEG report: muscle artifact,episodic frontally predominant rhythmic generalized activity with one event representing seizure.     [name] recommended prolonged EEG which can can get  during this hospitalization.     Resident to order EEG and consult neurologist/Dr Hickling.     Continue home seizure regimen.     Seizure precaution.  2. Hypoglycemia: CBG low on admission now back to normal.     Might be related to post seizure hypoglycemia.     Monitor glucose level.  3. Dysphagia:     Chest xray cannot r/o aspiration.     Consider repeat in AM.     Continue PEG tube feeding.  4. Hypothyroidism: COntinue home regimen.     Check TSH.

## 2014-01-11 NOTE — Progress Notes (Signed)
[facility] Admission History and Physical Service Pager: [phone]  Patient name: Megan [name] Medical record number: [id number] Date of birth: June 14, 1980 Age: 33 y.o. Gender: female  Primary Care Provider: Beverlyn Roux, MD Consultants: Neurology  Code Status: Full   Chief Complaint: Recurrent seizures   Assessment and Plan: Megan [name] is a 33 y.o. female presenting with recurrent seizures. PMH is significant for cerebral palsy, seizures (minor motor and generalized tonic-clonic), intellectual disability, cortical blindness, dysphagia with PEG tube in place, and hypothyroidism.   Recurrent seizures: Patient has a history of minor motor and generalized tonic-clonic seizures for which she is treated with levetiracetam, primidone, valproate, and lacosamide. She is followed by Dr. Gaynell Face at [facility]. Per her grandmother, she had several seizures that appeared to be more than her typical minor motor seizures. Witnessed seizure in ED appeared tonic-clonic. Glucose 57 upon arrival however likely low secondary to metabolic demand after several seizures and not the inciting event. Portable chest showed possible subsegmental atelectasis at the right lung base with aspiration not absolutely excluded. Grandmother reports recent coughing, but no leukocytosis, fever, crackles on exam to suggest a pneumonia. Her UA was within normal limits. No evidence of physiologic stressor that would provoke recent seizures at this point. Caregiver reports compliance with medications. Valproate level slightly elevated at 106.1 -- Neurology consulted, appreciate recommendations.  -- Contacted Dr. Gaynell Face (primary neurologist) to inform him patient is admitted; No response left voicemail -- Most recent EEG (07/2013):  Low-voltage slowing, which is nonspecific and consistent with patient's static encephalopathy -- Continue home levetiracetam, primidone, valproate, and  lacosamide; per pharmacy's dosing  -- Seizure precautions   Hypoglycemia - CBG 57 in ED.  - Now improved s/p D50 - Likely related to continued seizure activity. - Will continue to monitor closely.   Cerebral palsy, profound intellectual disability: Grandmother reports patient is at her baseline between seizures. She is nonverbal and unable to complete any ADLs without assistance.  Dysphagia, PEG tube in place: PEG tube securely in place without issue.  -- Continue home ranitidine, loratadine -- Jevity 1 can TID  Cortical blindness: At baseline. -- Continue home polytrim   Hypothyroidism: Most recent TSH in 09/2010 was within normal limits.  -- F/u TSH -- Continue home synthroid  FEN/GI: NPO, everything through PEG; IV fluids D5 40mL/h Prophylaxis: SubQ heparin   Disposition: Telemetry   History of Present Illness: Megan [name] is a 33 y.o. female presenting with recurrent seizure. PMH is significant for cerebral palsy, seizures (minor motor and generalized tonic-clonic), intellectual disability, cortical blindness, dysphagia with PEG tube in place, and hypothyroidism. Per grandmother (primary caregiver), around 2am last night she started to have recurrent seizures that last about 5-10 minutes and have occurred about 20 times total. She is followed by Dr. Betsy Pries at [facility] and was most recently seen in 10/2013 for evaluation of daily minor motor seizures for which she was evaluated with an ambulatory EEG. Grandmother reports that she was told that the EEG showed she was having 10-15 minor motor seizures a day. However, she reports the seizures that started overnight are "real" seizures in that she is shaking more and they last longer. She moves her arms but not her legs. At baseline, she laughs some and recognizes family members. She does not perform any of her activities of daily living. Grandmother reports she appears baseline between seizures, though possible she is  slightly more out-of-it.   Grandmother reports  she has been coughing up white sputum more the past few days. She denies fevers or chills. Otherwise, she has not been ill or had any major events. She was most recently in the hospital in December for pneumonia. She hasn't had any seizures beyond minor motor seizures since December. Keppra is a new anti-epileptic that she was put on in December after she did not tolerate Dilantin.   Review Of Systems: Per HPI Otherwise 12 point review of systems was performed and was unremarkable.  Patient Active Problem List   Diagnosis Date Noted  . Localization-related symptomatic epilepsy and epileptic syndromes with complex partial seizures, not intractable, without status epilepticus 08/23/2013  . Unspecified constipation 04/18/2013  . Pneumonia due to Pseudomonas 04/18/2013  . Fibroid 04/18/2013  . Aspiration pneumonia 04/02/2013  . Acute confusion due to medical condition 04/02/2013  . SIRS (systemic inflammatory response syndrome) 04/02/2013  . Abnormal mental state 03/27/2013  . Seizure disorder 03/27/2013  . Allergic rhinitis 11/04/2012  . 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 Encounter for long-term (current) use of other medications 10/19/2012  . Severe intellectual disabilities 10/19/2012  . 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 Feeding difficulties and mismanagement 02/19/2012  . Well adult exam 05/27/2011  . Hypothyroidism 06/25/2010  . Hypertension 06/23/2010  . Seizure 06/06/2010  . CEREBRAL PALSY 02/21/2010  . BLINDNESS, BILATERAL 02/21/2010   Past Medical History: Past Medical History  Diagnosis Date  . Cerebral palsy, quadriplegic   . Cortical blindness   . Seizures   . Mental retardation   . Cerebral palsy   . Hypertension 06/23/2010  . Vision abnormalities   . Allergic  rhinitis 11/04/2012  . CHF (congestive heart failure)   . COPD (chronic obstructive pulmonary disease)   . Pneumonia December 2014    Was intubated, [facility]  . Dilantin toxicity August 2014   Past Surgical History: Past Surgical History  Procedure Laterality Date  . Peg tube placement    . Peg placement  09/03/2011    Procedure: PERCUTANEOUS ENDOSCOPIC GASTROSTOMY (PEG) REPLACEMENT;  Surgeon: Jerene Bears, MD;  Location: [facility];  Service: Gastroenterology;  Laterality: N/A;  . Peg placement  02/19/2012    Procedure: PERCUTANEOUS ENDOSCOPIC GASTROSTOMY (PEG) REPLACEMENT;  Surgeon: [place] Dragon, MD;  Location: [facility];  Service: Endoscopy;  Laterality: N/A;  needs gastrostomy button 24 Fr 4.4 and 3.5 available  . Peg placement N/A 05/30/2013    Procedure: PERCUTANEOUS ENDOSCOPIC GASTROSTOMY (PEG) REPLACEMENT;  Surgeon: Jerene Bears, MD;  Location: [place];  Service: Gastroenterology;  Laterality: N/A;  . Peg placement N/A 07/03/2013    Procedure: PERCUTANEOUS ENDOSCOPIC GASTROSTOMY (PEG) REPLACEMENT;  Surgeon: [place] Dragon, MD;  Location: [facility];  Service: Endoscopy;  Laterality: N/A;  Replace with 20 Fr x 3.5 cm mini button-   Social History: History  Substance Use Topics  . Smoking status: Passive Smoke Exposure - Never Smoker  . Smokeless tobacco: Never Used  . Alcohol Use: No   Additional social history: Grandmother is primary caregiver. She goes to daycare.  Please also refer to relevant sections of EMR.  Family History: Family History  Problem Relation Age of Onset  . Diabetes Mother   . Diabetes Maternal Grandmother   . Stroke Maternal Grandmother   . Diabetes Maternal Aunt   . Hypertension Other     Fhx  . Cancer Maternal Grandfather  Died at 54   Allergies and Medications: Allergies  Allergen Reactions  . Doxycycline Rash    REACTION: Blisters / swelling   No current facility-administered medications on file prior to encounter.   Current  Outpatient Prescriptions on File Prior to Encounter  Medication Sig Dispense Refill  . levETIRAcetam (KEPPRA) 100 MG/ML solution Give 12.5 mls in the morning and 12.5 mls in the evening by peg tube  850 mL  5  . levothyroxine (SYNTHROID, LEVOTHROID) 88 MCG tablet Take 1 tablet (88 mcg total) by mouth daily.  30 tablet  11  . loratadine (CLARITIN) 5 MG/5ML syrup Take 10 mLs (10 mg total) by mouth daily.  300 mL  12  . Nutritional Supplements (JEVITY) LIQD 1 Can by Per J Tube route 3 (three) times daily.  90 Can  11  . ranitidine (ZANTAC) 75 MG/5ML syrup 5 mLs (75 mg total) by Per J Tube route 2 (two) times daily.  480 mL  5  . trimethoprim-polymyxin b (POLYTRIM) ophthalmic solution Place 2 drops into the left eye every 4 (four) hours.  10 mL  0  . valproate (DEPAKENE) 250 MG/5ML syrup Give 13 ml by peg tube every 6 hours  1600 mL  5  . VIMPAT 10 MG/ML SOLN Give 20ml in the morning and 80ml in the evening  310 mL  5  . AMBULATORY NON FORMULARY MEDICATION Abdominal binder  1 packet  1    Objective: BP 125/89  Pulse 97  Resp 14  Ht 4\' 11"  (1.499 m)  Wt 110 lb (49.896 kg)  BMI 22.21 kg/m2  SpO2 100%  Exam: General: Intellectually disabled, nonverbal woman in NAD. HEENT: Dry mucous membranes. Sluggish pupillary reflex. EOMI, though eyes often deviate out of alignment.  Cardiovascular: RRR, II/VI systolic murmur at LUSB. No thrills, heaves.  Respiratory: CTAB. No crackles or wheezes. Limited air movement.  Abdomen: Soft, nontender. +bs. PEG tube c/d/i Extremities:  Contractures of bilateral upper and lower extremities. Contractures of fingers on both hands.  Skin: No lesions. Neuro: Nonverbal at baseline.   Labs and Imaging: CBC BMET   Recent Labs Lab 01/11/14 1202  WBC 6.4  HGB 12.2  HCT 36.9  PLT 207    Recent Labs Lab 01/11/14 1202  NA 142  K 4.8  CL 101  CO2 31  BUN 12  CREATININE 0.61  GLUCOSE 64*  CALCIUM 9.0     Valproate 106.1 (50 - 100)  Verlin Dike,  Med Student 01/11/2014, 3:37 PM Acting Intern, [place] Intern pager: [phone], text pages welcome  I have seen the patient and agree with the note. Addendum in blue.  Physical exam: General: resting in bed, nonverbal, NAD.  HEENT: Pupils reactive but sluggish.  Cardiovascular: RRR. No murmur noted.  Respiratory: Poor air movement.  No adventitious sounds auscultated.  Abdomen: Soft, nontender ,nondistended. PEG tube in place.  Extremities:  Contractures of bilateral upper and lower extremities. Skin: warm, dry, intact.  Neuro: Pupils reactive but sluggish. Nonverbal. Does not follow commands.  [place] PGY-3 Pager #: [phone]

## 2014-01-11 NOTE — Telephone Encounter (Signed)
Lemont Fillers called and said her grandmother called her today and told her that the pt was having seizures all night. The grandmother and the pt did not get any sleep. Deshona said that when she came to the house today and at 7:15 am, the pt had seizure. She said the pt's upper body was convulsing and her face was twitching. Deshona also mentioned that the pt was staring straight ahead. Samule Ohm can be reached at [phone].

## 2014-01-11 NOTE — ED Notes (Signed)
Family at bedside. 

## 2014-01-11 NOTE — Telephone Encounter (Signed)
[name] - I reviewed the hospital chart and it appears that the patient is being admitted. TG

## 2014-01-11 NOTE — Telephone Encounter (Signed)
I received a message saying that the family was taking [name] to the ER. I attempted to call Deshona but received a message saying that the phone that I called was unavailable. I will try again later. TG

## 2014-01-11 NOTE — Consult Note (Signed)
Reason for Consult: Recurrent, multiple seizures  HPI:                                                                                                                                          Megan [name] is an 33 y.o. female with a history of cerebral palsy, mental retardation, cortical blindness, seizure disorder,hypothyroidism, presenting with multiple generalized seizures starting a 2:00 a.m. Today. Pt had 6-7 seizures at home and one seizure in the ED. She was given Ativan with subsequent seizure reported. She currently takes Keppra 1250 mg BID, Primidone 125 mg BID, and valproic acid 300 mg Q6H. Valproic acid level today was 106. Remaining AED levels are pending. Vimpat was prescribed in 08/2013, with plan to D/C primidone, but was never started. Labs were unremarkable except for CBG of 54.  Past Medical History  Diagnosis Date  . Cerebral palsy, quadriplegic   . Cortical blindness   . Seizures   . Mental retardation   . Cerebral palsy   . Hypertension 06/23/2010  . Vision abnormalities   . Allergic rhinitis 11/04/2012  . CHF (congestive heart failure)   . COPD (chronic obstructive pulmonary disease)   . Pneumonia December 2014    Was intubated, [facility]  . Dilantin toxicity August 2014    Past Surgical History  Procedure Laterality Date  . Peg tube placement    . Peg placement  09/03/2011    Procedure: PERCUTANEOUS ENDOSCOPIC GASTROSTOMY (PEG) REPLACEMENT;  Surgeon: Jerene Bears, MD;  Location: [facility];  Service: Gastroenterology;  Laterality: N/A;  . Peg placement  02/19/2012    Procedure: PERCUTANEOUS ENDOSCOPIC GASTROSTOMY (PEG) REPLACEMENT;  Surgeon: Lafayette Dragon, MD;  Location: [facility];  Service: Endoscopy;  Laterality: N/A;  needs gastrostomy button 24 Fr 4.4 and 3.5 available  . Peg placement N/A 05/30/2013    Procedure: PERCUTANEOUS ENDOSCOPIC GASTROSTOMY (PEG) REPLACEMENT;  Surgeon: Jerene Bears, MD;  Location: [place];  Service: Gastroenterology;   Laterality: N/A;  . Peg placement N/A 07/03/2013    Procedure: PERCUTANEOUS ENDOSCOPIC GASTROSTOMY (PEG) REPLACEMENT;  Surgeon: Lafayette Dragon, MD;  Location: [facility];  Service: Endoscopy;  Laterality: N/A;  Replace with 20 Fr x 3.5 cm mini button-    Family History  Problem Relation Age of Onset  . Diabetes Mother   . Diabetes Maternal Grandmother   . Stroke Maternal Grandmother   . Diabetes Maternal Aunt   . Hypertension Other     Fhx  . Cancer Maternal Grandfather     Died at 76    Social History:  reports that she has been passively smoking.  She has never used smokeless tobacco. She reports that she does not drink alcohol or use illicit drugs.  Allergies  Allergen Reactions  . Doxycycline Rash    REACTION: Blisters / swelling    MEDICATIONS:  I have reviewed the patient's current medications.   ROS:                                                                                                                                       History obtained from grandmother and and caretaker  General ROS: negative for - chills, fatigue, fever, night sweats, weight gain or weight loss Psychological ROS: as noted in HPI Ophthalmic ROS: negative for - blurry vision, double vision, eye pain or loss of vision ENT ROS: negative for - epistaxis, nasal discharge, oral lesions, sore throat, tinnitus or vertigo Allergy and Immunology ROS: negative for - hives or itchy/watery eyes Hematological and Lymphatic ROS: negative for - bleeding problems, bruising or swollen lymph nodes Endocrine ROS: negative for - galactorrhea, hair pattern changes, polydipsia/polyuria or temperature intolerance Respiratory ROS: mild cough, no fever Cardiovascular ROS: negative for - chest pain, dyspnea on exertion, edema or irregular heartbeat Gastrointestinal ROS: negative for - abdominal pain,  diarrhea, hematemesis, nausea/vomiting or stool incontinence Genito-Urinary ROS: negative for - dysuria, hematuria, incontinence or urinary frequency/urgency Musculoskeletal ROS: negative for - joint swelling or muscular weakness Neurological ROS: as noted in HPI Dermatological ROS: negative for rash and skin lesion changes   Blood pressure 125/89, pulse 97, resp. rate 14, height 4' 11" (1.499 m), weight 49.896 kg (110 lb), SpO2 100.00%.   Neurologic Examination:                                                                                                      Somnolent, could not be aroused. Pupils small and reactive to light. EOMs intact with oculocephalic manuvers. Face symmetrical with no focal weakness. Flaccid muscle tone throughout; contractures noted at elbows, wrists, knees and ankles. DTRs absent except for 1+ ankle jerks Plantar responses extensor bilaterally.  No results found for this basename: cbc, bmp, coags, chol, tri, ldl, hga1c    Results for orders placed during the hospital encounter of 01/11/14 (from the past 48 hour(s))  CBC WITH DIFFERENTIAL     Status: Abnormal   Collection Time    01/11/14 12:02 PM      Result Value Ref Range   WBC 6.4  4.0 - 10.5 K/uL   RBC 3.70 (*) 3.87 - 5.11 MIL/uL   Hemoglobin 12.2  12.0 - 15.0 g/dL   HCT 36.9  36.0 - 46.0 %   MCV 99.7  78.0 - 100.0 fL   MCH 33.0  26.0 - 34.0 pg  MCHC 33.1  30.0 - 36.0 g/dL   RDW 18.0 (*) 11.5 - 15.5 %   Platelets 207  150 - 400 K/uL   Neutrophils Relative % 59  43 - 77 %   Neutro Abs 3.8  1.7 - 7.7 K/uL   Lymphocytes Relative 28  12 - 46 %   Lymphs Abs 1.8  0.7 - 4.0 K/uL   Monocytes Relative 12  3 - 12 %   Monocytes Absolute 0.8  0.1 - 1.0 K/uL   Eosinophils Relative 1  0 - 5 %   Eosinophils Absolute 0.1  0.0 - 0.7 K/uL   Basophils Relative 0  0 - 1 %   Basophils Absolute 0.0  0.0 - 0.1 K/uL  BASIC METABOLIC PANEL     Status: Abnormal   Collection Time    01/11/14 12:02 PM       Result Value Ref Range   Sodium 142  137 - 147 mEq/L   Potassium 4.8  3.7 - 5.3 mEq/L   Chloride 101  96 - 112 mEq/L   CO2 31  19 - 32 mEq/L   Glucose, Bld 64 (*) 70 - 99 mg/dL   BUN 12  6 - 23 mg/dL   Creatinine, Ser 0.61  0.50 - 1.10 mg/dL   Calcium 9.0  8.4 - 10.5 mg/dL   GFR calc non Af Amer >90  >90 mL/min   GFR calc Af Amer >90  >90 mL/min   Comment: (NOTE)     The eGFR has been calculated using the CKD EPI equation.     This calculation has not been validated in all clinical situations.     eGFR's persistently <90 mL/min signify possible Chronic Kidney     Disease.   Anion gap 10  5 - 15  VALPROIC ACID LEVEL     Status: Abnormal   Collection Time    01/11/14 12:02 PM      Result Value Ref Range   Valproic Acid Lvl 106.1 (*) 50.0 - 100.0 ug/mL  URINALYSIS, ROUTINE W REFLEX MICROSCOPIC     Status: None   Collection Time    01/11/14 12:55 PM      Result Value Ref Range   Color, Urine YELLOW  YELLOW   APPearance CLEAR  CLEAR   Specific Gravity, Urine 1.015  1.005 - 1.030   pH 8.0  5.0 - 8.0   Glucose, UA NEGATIVE  NEGATIVE mg/dL   Hgb urine dipstick NEGATIVE  NEGATIVE   Bilirubin Urine NEGATIVE  NEGATIVE   Ketones, ur NEGATIVE  NEGATIVE mg/dL   Protein, ur NEGATIVE  NEGATIVE mg/dL   Urobilinogen, UA 0.2  0.0 - 1.0 mg/dL   Nitrite NEGATIVE  NEGATIVE   Leukocytes, UA NEGATIVE  NEGATIVE   Comment: MICROSCOPIC NOT DONE ON URINES WITH NEGATIVE PROTEIN, BLOOD, LEUKOCYTES, NITRITE, OR GLUCOSE <1000 mg/dL.  CBG MONITORING, ED     Status: Abnormal   Collection Time    01/11/14  2:19 PM      Result Value Ref Range   Glucose-Capillary 57 (*) 70 - 99 mg/dL  CBG MONITORING, ED     Status: None   Collection Time    01/11/14  2:58 PM      Result Value Ref Range   Glucose-Capillary 79  70 - 99 mg/dL    Dg Chest Port 1 View  01/11/2014   CLINICAL DATA:  Seizure activity  EXAM: PORTABLE CHEST - 1 VIEW  COMPARISON:  PA and lateral chest  of December 28, 2012  FINDINGS: The lungs  are mildly hypoinflated. The lung markings are slightly increased just above the dome of the right hemidiaphragm. There is no pleural effusion or pneumothorax. The heart and mediastinal structures are normal. The bony thorax exhibits no acute abnormalities. The gas pattern in the upper abdomen is unremarkable.  IMPRESSION: There is mildly increased density at the right lung base which may reflect subsegmental atelectasis. This is likely related to the patient's hypo inflation but aspiration is not absolutely excluded. When the patient can tolerate the procedure, a PA and lateral chest x-ray with deep inspiration would be useful.   Electronically Signed   By: David  Martinique   On: 01/11/2014 14:19   Assessment/Plan: 33 y.o. with mental retardation and seizure disorder, presenting with a recurrent flurry of seizures. Precipitating factor(s) unclear, but has been admitted with similar seizure flurires on multiple occasions.  Recommendations: 1. D/C Primidone 2. Start Vimpat at 50 mg (10 mg/ml, 50m) Q12H 3. No changes in Valproate and Keppra 4. Video EEG monitoring if seizures recur  We will closely manage this patient with you.   C.R. SNicole Kindred MD [facility] [phone] 01/11/2014, 6:07 PM

## 2014-01-11 NOTE — ED Provider Notes (Addendum)
CSN: [id number]     Arrival date & time 01/11/14  1052 History   First MD Initiated Contact with Patient 01/11/14 1111     Chief Complaint  Patient presents with  . Seizures     (Consider location/radiation/quality/duration/timing/severity/associated sxs/prior Treatment) HPI Comments: Patient brought to the ER for evaluation of seizures. Patient does have a pre-existing seizure disorder. Patient has had increased seizure disorder recently, has had medication changes. She recently underwent video EEG over a three-day period at home. Results have not been provided to the caregiver yet. Patient reportedly had increased seizure activity yesterday at school. She was having what sounds like myoclonic jerking episodes intermittently through the day which are consistent with her focal seizures. The patient's primary caregiver reports increased seizures over the night. She did not sleep all night because of the seizure activity. She has had at least 7 generalized tonic-clonic seizures lasting 1-2 minutes each since 7 AM this morning. There reports that she has had some cough, nonproductive. No fever. No vomiting or diarrhea.  Patient is a 33 y.o. female presenting with seizures.  Seizures   Past Medical History  Diagnosis Date  . Cerebral palsy, quadriplegic   . Cortical blindness   . Seizures   . Mental retardation   . Cerebral palsy   . Hypertension 06/23/2010  . Vision abnormalities   . Allergic rhinitis 11/04/2012  . CHF (congestive heart failure)   . COPD (chronic obstructive pulmonary disease)   . Pneumonia December 2014    Was intubated, [facility]  . Dilantin toxicity August 2014   Past Surgical History  Procedure Laterality Date  . Peg tube placement    . Peg placement  09/03/2011    Procedure: PERCUTANEOUS ENDOSCOPIC GASTROSTOMY (PEG) REPLACEMENT;  Surgeon: Jerene Bears, MD;  Location: [facility];  Service: Gastroenterology;  Laterality: N/A;  . Peg placement  02/19/2012     Procedure: PERCUTANEOUS ENDOSCOPIC GASTROSTOMY (PEG) REPLACEMENT;  Surgeon: Lafayette Dragon, MD;  Location: [facility];  Service: Endoscopy;  Laterality: N/A;  needs gastrostomy button 24 Fr 4.4 and 3.5 available  . Peg placement N/A 05/30/2013    Procedure: PERCUTANEOUS ENDOSCOPIC GASTROSTOMY (PEG) REPLACEMENT;  Surgeon: Jerene Bears, MD;  Location: [place];  Service: Gastroenterology;  Laterality: N/A;  . Peg placement N/A 07/03/2013    Procedure: PERCUTANEOUS ENDOSCOPIC GASTROSTOMY (PEG) REPLACEMENT;  Surgeon: Lafayette Dragon, MD;  Location: [facility];  Service: Endoscopy;  Laterality: N/A;  Replace with 20 Fr x 3.5 cm mini button-   Family History  Problem Relation Age of Onset  . Diabetes Mother   . Diabetes Maternal Grandmother   . Stroke Maternal Grandmother   . Diabetes Maternal Aunt   . Hypertension Other     Fhx  . Cancer Maternal Grandfather     Died at 52   History  Substance Use Topics  . Smoking status: Passive Smoke Exposure - Never Smoker  . Smokeless tobacco: Never Used  . Alcohol Use: No   OB History   Grav Para Term Preterm Abortions TAB SAB Ect Mult Living                 Review of Systems  Respiratory: Positive for cough.   Neurological: Positive for seizures.  All other systems reviewed and are negative.     Allergies  Doxycycline  Home Medications   Prior to Admission medications   Medication Sig Start Date End Date Taking? Authorizing Provider  levETIRAcetam (KEPPRA) 100 MG/ML solution Give 12.5  mls in the morning and 12.5 mls in the evening by peg tube 08/23/13  Yes Rockwell Germany, NP  levothyroxine (SYNTHROID, LEVOTHROID) 88 MCG tablet Take 1 tablet (88 mcg total) by mouth daily. 07/07/13  Yes Frazier Richards, MD  loratadine (CLARITIN) 5 MG/5ML syrup Take 10 mLs (10 mg total) by mouth daily. 02/17/13  Yes Frazier Richards, MD  Nutritional Supplements (JEVITY) LIQD 1 Can by Per J Tube route 3 (three) times daily. 03/20/13  Yes Frazier Richards, MD   primidone (MYSOLINE) 250 MG tablet Take 125 mg by mouth 2 (two) times daily. Give 1/2 tablet in the morning and 1/2 tablet in the evening crushed via peg tube 05/29/13  Yes Rockwell Germany, NP  ranitidine (ZANTAC) 75 MG/5ML syrup 5 mLs (75 mg total) by Per J Tube route 2 (two) times daily. 12/12/13  Yes Frazier Richards, MD  trimethoprim-polymyxin b (POLYTRIM) ophthalmic solution Place 2 drops into the left eye every 4 (four) hours. 07/25/13  Yes Janne Napoleon, NP  valproate (DEPAKENE) 250 MG/5ML syrup Give 13 ml by peg tube every 6 hours 05/29/13  Yes Rockwell Germany, NP  VIMPAT 10 MG/ML SOLN Give 61ml in the morning and 2ml in the evening 09/11/13  Yes Rockwell Germany, NP  AMBULATORY NON FORMULARY MEDICATION Abdominal binder 05/30/13   Jerene Bears, MD   BP 109/79  Pulse 81  Resp 13  Ht 4\' 11"  (1.499 m)  Wt 110 lb (49.896 kg)  BMI 22.21 kg/m2  SpO2 100% Physical Exam  Constitutional: She appears well-developed and well-nourished. No distress.  HENT:  Head: Atraumatic.  Right Ear: Hearing normal.  Left Ear: Hearing normal.  Nose: Nose normal.  Mouth/Throat: Oropharynx is clear and moist and mucous membranes are normal.  Eyes: Conjunctivae and EOM are normal. Pupils are equal, round, and reactive to light.  Neck: Normal range of motion. Neck supple.  Cardiovascular: Regular rhythm, S1 normal and S2 normal.  Exam reveals no gallop and no friction rub.   No murmur heard. Pulmonary/Chest: Effort normal and breath sounds normal. No respiratory distress. She exhibits no tenderness.  Abdominal: Soft. Normal appearance and bowel sounds are normal. There is no hepatosplenomegaly. There is no tenderness. There is no rebound, no guarding, no tenderness at McBurney's point and negative Murphy's sign. No hernia.  Musculoskeletal: Normal range of motion.  Neurological: She has normal strength. No cranial nerve deficit or sensory deficit. Coordination normal. GCS eye subscore is 4. GCS verbal subscore is 5. GCS  motor subscore is 6.  somnolent, does not follow commands, no obvious focal motor or sensory deficits noted on exam  Skin: Skin is warm, dry and intact. No rash noted. No cyanosis.  Psychiatric: She has a normal mood and affect. Her speech is normal and behavior is normal. Thought content normal.    ED Course  Procedures (including critical care time) Labs Review Labs Reviewed  CBC WITH DIFFERENTIAL - Abnormal; Notable for the following:    RBC 3.70 (*)    RDW 18.0 (*)    All other components within normal limits  BASIC METABOLIC PANEL - Abnormal; Notable for the following:    Glucose, Bld 64 (*)    All other components within normal limits  VALPROIC ACID LEVEL - Abnormal; Notable for the following:    Valproic Acid Lvl 106.1 (*)    All other components within normal limits  CBG MONITORING, ED - Abnormal; Notable for the following:    Glucose-Capillary 57 (*)  All other components within normal limits  URINALYSIS, ROUTINE W REFLEX MICROSCOPIC  LEVETIRACETAM LEVEL  PRIMIDONE AND METABOLITE LEVEL  CBG MONITORING, ED    Imaging Review Dg Chest Port 1 View  01/11/2014   CLINICAL DATA:  Seizure activity  EXAM: PORTABLE CHEST - 1 VIEW  COMPARISON:  PA and lateral chest of December 28, 2012  FINDINGS: The lungs are mildly hypoinflated. The lung markings are slightly increased just above the dome of the right hemidiaphragm. There is no pleural effusion or pneumothorax. The heart and mediastinal structures are normal. The bony thorax exhibits no acute abnormalities. The gas pattern in the upper abdomen is unremarkable.  IMPRESSION: There is mildly increased density at the right lung base which may reflect subsegmental atelectasis. This is likely related to the patient's hypo inflation but aspiration is not absolutely excluded. When the patient can tolerate the procedure, a PA and lateral chest x-ray with deep inspiration would be useful.   Electronically Signed   By: David  Martinique   On:  01/11/2014 14:19     EKG Interpretation None      MDM   Final diagnoses:  None   seizures  Hypoglycemia  Presents to ER for evaluation of multiple seizures. Patient has a history of seizure disorder. She has had increased seizure activity recently, prompting changes in her medication and a recent video EG. In the last 24 hours, however, she has had significant worsening. The course of the day yesterday she had numerous focal seizures. Overnight and into this morning, however, she began to experience generalized tonic-clonic seizures. She had recurrent seizures, unknown amount overnight and 7 this morning prior to to the ER. I did is at least one episode where she had eye deviation upward to the left, grunting, shaking all over that was very consistent with generalized seizure.  Access initially was very difficult to obtain access. Ultimately we were able to obtain an IV in her right foot. I did attempt ultrasound-guided IVs times unsuccessfully. Patient was administered Ativan IM to help prevent further seizures.  She is also identified as being hypoglycemic. With the lack of IV access, patient provided with glucose.  Is on her antiepileptic medications are pending, other than valproic acid which is slightly supratherapeutic. Workup was largely unremarkable. She does have a haziness in the right base of her lung field that could be aspiration. This would not be of trauma possibility based on her chronic baseline mental status as well as recurrent seizures. She does not, however, have any respiratory symptoms currently. There is no respiratory distress. Lungs are clear, oxygen saturation is normal. This could be followed clinically.  Case discussed briefly with Doctor Nicole Kindred, neurology. He will consult on the patient. Patient will be admitted by the family practice service for further management.  Addendum: Doctor Gaynell Face, the patient's primary neurologist, has contacted me. He reports that  the patient has clusters of seizures that are often refractory period he has asked me to put his phone number in the records show the admitting service and neurohospitalist can contact them if needed. Doctor Hickling's number is [phone].    Orpah Greek, MD 01/11/14 1518  Orpah Greek, MD 01/11/14 [phone]

## 2014-01-11 NOTE — ED Notes (Signed)
Pt in with caregiver reporting multiple seizures over the last few days, pt has been evaluated for seizures recently but family reports frequency and severity is increasing, they report she is at her baseline mental status

## 2014-01-11 NOTE — Telephone Encounter (Signed)
Reviewed the 71 hour EEG there were 2 events recorded both of them associated with muscle artifact.  There were other episodes of frontally predominant rhythmic generalized activity that went from a slower rate to more rapid rate that may have been electrographic seizures.  The patient also has frequent frontal and generalized sharply contoured Slow-wave A been frontal intermittent rhythmic delta range activity predominantly went to sleep.  Only one of the episodes looked as if it might represent electrographic seizure.  I left a message with the emergency room doctor that I would be happy to help the hospitalist.  I think that she should have a prolonged EEG so that we can capture these behaviors.  I paged the hospitalist to talk to him about this.

## 2014-01-11 NOTE — ED Notes (Signed)
Pt.undress in a gown &on monitor .seizurepadds on side rails

## 2014-01-12 ENCOUNTER — Inpatient Hospital Stay (HOSPITAL_COMMUNITY): Payer: Medicaid Other

## 2014-01-12 DIAGNOSIS — H543 Unqualified visual loss, both eyes: Secondary | ICD-10-CM

## 2014-01-12 LAB — GLUCOSE, CAPILLARY: Glucose-Capillary: 101 mg/dL — ABNORMAL HIGH (ref 70–99)

## 2014-01-12 LAB — CBC
HEMATOCRIT: 36.3 % (ref 36.0–46.0)
HEMOGLOBIN: 11.9 g/dL — AB (ref 12.0–15.0)
MCH: 33.2 pg (ref 26.0–34.0)
MCHC: 32.8 g/dL (ref 30.0–36.0)
MCV: 101.4 fL — ABNORMAL HIGH (ref 78.0–100.0)
Platelets: 185 10*3/uL (ref 150–400)
RBC: 3.58 MIL/uL — ABNORMAL LOW (ref 3.87–5.11)
RDW: 18.4 % — ABNORMAL HIGH (ref 11.5–15.5)
WBC: 7.8 10*3/uL (ref 4.0–10.5)

## 2014-01-12 LAB — BASIC METABOLIC PANEL
ANION GAP: 9 (ref 5–15)
BUN: 10 mg/dL (ref 6–23)
CO2: 27 meq/L (ref 19–32)
Calcium: 8.6 mg/dL (ref 8.4–10.5)
Chloride: 100 mEq/L (ref 96–112)
Creatinine, Ser: 0.55 mg/dL (ref 0.50–1.10)
GFR calc Af Amer: >90 mL/min (ref >90)
GFR calc non Af Amer: >90 mL/min (ref >90)
GLUCOSE: 75 mg/dL (ref 70–99)
Potassium: 4.9 mEq/L (ref 3.7–5.3)
SODIUM: 136 meq/L — AB (ref 137–147)

## 2014-01-12 MED ORDER — RANITIDINE HCL 150 MG/10ML PO SYRP
75.0000 mg | ORAL_SOLUTION | Freq: Two times a day (BID) | ORAL | Status: DC
  Administered 2014-01-12 – 2014-01-13 (×3): 75 mg
  Filled 2014-01-12 (×4): qty 10

## 2014-01-12 MED ORDER — VALPROIC ACID 250 MG/5ML PO SYRP
650.0000 mg | ORAL_SOLUTION | Freq: Four times a day (QID) | ORAL | Status: DC
  Administered 2014-01-12 – 2014-01-13 (×6): 650 mg
  Filled 2014-01-12 (×9): qty 15

## 2014-01-12 MED FILL — Valproate Sodium Syrup 250 MG/5ML (Base Equiv): ORAL | Qty: 7.5 | Status: AC

## 2014-01-12 MED FILL — Ranitidine HCl Syrup 15 MG/ML (75 MG/5ML): ORAL | Qty: 10 | Status: AC

## 2014-01-12 NOTE — Progress Notes (Signed)
Family never came to pt's room, so unable to ask about pt's vaccine status on day shift today, 01/12/14.

## 2014-01-12 NOTE — Progress Notes (Signed)
Subjective: No recurrence of seizure activity reported since admission. No adverse overnight events reported otherwise.  Objective: Current vital signs: BP 109/45  Pulse 82  Temp(Src) 98.2 F (36.8 C) (Axillary)  Resp 18  Ht 4\' 11"  (1.499 m)  Wt 47.945 kg (105 lb 11.2 oz)  BMI 21.34 kg/m2  SpO2 100%  Neurologic Exam: Patient was markedly somnolent but reactive to external stimuli with withdrawal movements of extremities as well as head. Pupils were equal and reacted normally to light. Extraocular movements were intact oculocephalic maneuvers. Face was symmetrical with no focal weakness. Extremities were flaccid at rest.  Medications: I have reviewed the patient's current medications.  Assessment/Plan: 33 year old with cerebral palsy and mental retardation who was admitted for recurrent multiple generalized seizures. Seizures appear to be well controlled at this point on a regimen of Vimpat 50 mg twice a day, Depakote 650 mg 4 times a day and Keppra 1250 mg twice a day.  24 hour video EEG recording was initiated.  No changes in current management anticipated.  C.R. Nicole Kindred, MD [facility] [phone]  01/12/2014  9:10 PM

## 2014-01-12 NOTE — Progress Notes (Signed)
Family Medicine Teaching Service Daily Progress Note Intern Pager: [phone]  Patient name: Megan [name] Medical record number: [id number] Date of birth: 12-30-1980 Age: 33 y.o. Gender: female  Primary Care Provider: Beverlyn Roux, MD Consultants: Neurology Code Status: Full  Pt Overview and Major Events to Date:  9/17 - Admitted with recurrent seizures   Assessment and Plan: Megan [name] is a 33 y.o. female presenting with recurrent seizures. PMH is significant for cerebral palsy, seizures (minor motor and generalized tonic-clonic), intellectual disability, cortical blindness, dysphagia with PEG tube in place, and hypothyroidism.   Recurrent seizures in setting of known seizure disorder: History of minor motor and generalized tonic-clonic seizures for which she is treated with levetiracetam, primidone, valproate, and lacosamide. Followed by Dr. Gaynell Face. Had several seizures that appeared to be more than her typical minor motor seizures and a witnessed tonic-clonic seizure in the ED.  No evidence of physiologic stressor that would provoke recent seizures at this point. Reportedly compliant with medications.  -- Neurology consulted, appreciate recommendations.  -- Will d/c primidone, start vimpat at 50mg  q12hrs per neurology -- Continue valproate, keppra -- Most recent EEG (07/2013): Low-voltage slowing, which is nonspecific and consistent with patient's static encephalopathy  -- Consult neurology for prolonged EEG -- Seizure precautions   Hypoglycemia. CBG 57 in ED, likely related to continued seizure activity. Improved with D50 - Will continue to monitor closely.   Cerebral palsy, profound intellectual disability: Grandmother reports patient is at her baseline between seizures. She is nonverbal and unable to complete any ADLs without assistance.   Dysphagia, PEG tube in place: PEG tube securely in place without issue.  -- Continue home ranitidine, loratadine  -- Jevity 1 can TID   -- CXR consistent with atelectasis and hypoventilation, but can not definitively rule out aspiration. Unsure if repeat study will be more revealing given patient's global delay and inability to fully cooperate with exam. Can consider repeat if clinical course deteriorates.  Cortical blindness: At baseline.  -- Continue home polytrim   Hypothyroidism: Most recent TSH in 09/2010 was within normal limits.  -- TSH 0.488 -- Continue home synthroid   FEN/GI: NPO, everything through PEG; IV fluids D5 57mL/h  Prophylaxis: SubQ heparin    Disposition: Admitted under attending Dr Gwendlyn Deutscher pending continued evaluation and resolution of seizures.  Subjective:  NAEON. Sleeping this morning.   Objective: Temp:  [97.1 F (36.2 C)-97.5 F (36.4 C)] 97.1 F (36.2 C) (09/18 0936) Pulse Rate:  [72-101] 91 (09/18 0936) Resp:  [13-22] 20 (09/18 0936) BP: (65-125)/(51-89) 94/74 mmHg (09/18 0936) SpO2:  [87 %-100 %] 100 % (09/18 0936) Weight:  [105 lb 11.2 oz (47.945 kg)-110 lb (49.896 kg)] 105 lb 11.2 oz (47.945 kg) (09/17 2100) Physical Exam: General: Lying in bed asleep, nonverbal, globally delayed at baseline Cardiovascular: RRR, 2/6 Respiratory: CTAB. Upper airway noises transmitted Abdomen: Soft, nontender, nondistended. PEG tube c/d/i Extremities: No cyanosis or edema  Laboratory:  Recent Labs Lab 01/11/14 1202 01/12/14 0650  WBC 6.4 7.8  HGB 12.2 11.9*  HCT 36.9 36.3  PLT 207 185    Recent Labs Lab 01/11/14 1202 01/12/14 0650  NA 142 136*  K 4.8 4.9  CL 101 100  CO2 31 27  BUN 12 10  CREATININE 0.61 0.55  CALCIUM 9.0 8.6  GLUCOSE 64* 75    TSH 0.488  Imaging/Diagnostic Tests: Dg Chest Port 1 View 01/11/2014    IMPRESSION: There is mildly increased density at the right lung base which may reflect  subsegmental atelectasis. This is likely related to the patient's hypo inflation but aspiration is not absolutely excluded. When the patient can tolerate the procedure, a PA  and lateral chest x-ray with deep inspiration would be useful.     Dimas Chyle, MD 01/12/2014, 9:43 AM PGY-1, [facility] Intern pager: [phone], text pages welcome

## 2014-01-12 NOTE — Progress Notes (Signed)
LTVM up and running   

## 2014-01-12 NOTE — Discharge Summary (Signed)
[facility] Discharge Summary  Patient name: Megan [name] Medical record number: [id number] Date of birth: Jul 16, 1980 Age: 33 y.o. Gender: female Date of Admission: 01/11/2014  Date of Discharge: 01/13/2014 Admitting Physician: Andrena Mews, MD  Primary Care Provider: Beverlyn Roux, MD Consultants: Neurology  Indication for Hospitalization: Seizures  Discharge Diagnoses/Problem List:  Seizure disorder, hypoglycemia, cerebral palsy, intellectual disability, cortical blindness, hypothrroidism  Disposition: Home  Discharge Condition: Improved  Discharge Exam: Please see progress note for 01/13/2014  Brief Hospital Course:  Megan [name] is a 33 y.o. female who presented with recurrent seizures. PMH is significant for cerebral palsy, seizures (minor motor and generalized tonic-clonic), intellectual disability, cortical blindness, dysphagia with PEG tube in place, and hypothyroidism.   Her hospital course, by problem list, is listed below:  Recurrent seizures in setting of known seizure disorder: Patient has history of minor motor and generalized tonic-clonic seizures for which she is treated with levetiracetam, primidone, valproate, and lacosamide at home. She presented with several seizures that appeared to be more than her typical minor motor seizures and a witnessed tonic-clonic seizure in the ED. She had no evidence of physiologic stressor that would provoke recent seizures and she was reportedly compliant with medications. Neurology was consulted, including the patient's outpatient neurologist, [name]. She underwent prolonged EEG, which showed background static encephalopathy and epileptogenic interictal activity, but no clinical seizures with electrographic correlation. Her regimen was not changed and she was discharged home with follow-up with [name].   Hypoglycemia. Patient was initially hypoglycemic to CBG of 57 in ED, likely related to  continued seizure activity. This improved with D50. The patient was monitored closely and had no other significant hypoglycemic events.   Cerebral palsy with profound intellectual disability, dysphagia (PEG tube in place), and cortical blindness: Grandmother reports patient wass at her baseline between seizures. She is nonverbal and unable to complete any ADLs without assistance. We continued her home polytrim ointment for her cortical blindness.   PEG tube was securely in place without issue. Continued jevity 1 can TID. Her initially CXR was consistent with atelectasis and hypoventilation, but aspiration was not definitively ruled out. The patient had a stable course (afebrile, no hypoxia) and thus a repeat study was not performed as it would have been unlikely to be revealing given patient's global delay and inability to fully cooperate with exam.    Hypothyroidism: TSH 0.488 here. She was continued on her home dose of synthroid.   Issues for Follow Up:  1) Respiratory status given concern for potential aspiration 2) f/u any further hypoglycemic events 3) f/u any further seizures  Significant Procedures: Prolonged video EEG  Significant Labs and Imaging:   Recent Labs Lab 01/11/14 1202 01/12/14 0650  WBC 6.4 7.8  HGB 12.2 11.9*  HCT 36.9 36.3  PLT 207 185    Recent Labs Lab 01/11/14 1202 01/12/14 0650  NA 142 136*  K 4.8 4.9  CL 101 100  CO2 31 27  GLUCOSE 64* 75  BUN 12 10  CREATININE 0.61 0.55  CALCIUM 9.0 8.6   TSH 0.488  Results/Tests Pending at Time of Discharge: None  Discharge Medications:    Medication List    STOP taking these medications       primidone 250 MG tablet  Commonly known as:  MYSOLINE      TAKE these medications       AMBULATORY NON FORMULARY MEDICATION  Abdominal binder     JEVITY Liqd  1  Can by Per J Tube route 3 (three) times daily.     levETIRAcetam 100 MG/ML solution  Commonly known as:  KEPPRA  Give 12.5 mls in the  morning and 12.5 mls in the evening by peg tube     levothyroxine 88 MCG tablet  Commonly known as:  SYNTHROID, LEVOTHROID  Take 1 tablet (88 mcg total) by mouth daily.     loratadine 5 MG/5ML syrup  Commonly known as:  CLARITIN  Take 10 mLs (10 mg total) by mouth daily.     ranitidine 75 MG/5ML syrup  Commonly known as:  ZANTAC  5 mLs (75 mg total) by Per J Tube route 2 (two) times daily.     trimethoprim-polymyxin b ophthalmic solution  Commonly known as:  POLYTRIM  Place 2 drops into the left eye every 4 (four) hours.     valproate 250 MG/5ML syrup  Commonly known as:  DEPAKENE  Give 13 ml by peg tube every 6 hours     VIMPAT 10 MG/ML Soln  Generic drug:  Lacosamide  Give 92ml in the morning and 82ml in the evening        Discharge Instructions: Please refer to Patient Instructions section of EMR for full details.  Patient was counseled important signs and symptoms that should prompt return to medical care, changes in medications, dietary instructions, activity restrictions, and follow up appointments.   Follow-Up Appointments: Follow-up Information   Schedule an appointment as soon as possible for a visit with Beverlyn Roux, MD.   Specialty:  [facility] Medicine   Contact information:   [address] [place] [address] [phone]       Schedule an appointment as soon as possible for a visit with Jodi Geralds, MD.   Specialty:  Pediatrics   Contact information:   [address] [place] [address] [phone]       Dimas Chyle, MD 01/15/2014, 1:44 PM PGY-1, [place]

## 2014-01-12 NOTE — Progress Notes (Signed)
INITIAL NUTRITION ASSESSMENT  DOCUMENTATION CODES Per approved criteria  -Not Applicable   INTERVENTION: -Continue 1 can Jevity 1.2 TID per home TF regimen, provides 853 kcal, 39 g protein, 574 ml free water -Consider increasing Jevity 1.2 to QID to better meet estimated needs (1138 kcal, 53 g protein, 765 ml free water)  NUTRITION DIAGNOSIS: Inadequate oral intake related to inability to eat as evidenced by home TF  Goal: Patient will meet >/=90% of estimated nutrition needs  Monitor:  TF adequacy and tolerance, weight trends, labs, I/Os  Reason for Assessment: Home TF  33 y.o. female  Admitting Dx: Seizures  ASSESSMENT: 33 year old female patient with history of seizure disorder, cerebral palsy, blindness, hypothyroidism presented with recurrent seizures.   Patient with dysphagia with PEG tube.  Home TF regimen Jevity 1.2 1 can TID, which provides 853 kcal (78% estimated needs), 39 g protein (87% estimated protein needs), 574 ml free water Patient's weight has been stable, so this regimen likely adequate.   Height: Ht Readings from Last 1 Encounters:  01/11/14 4\' 11"  (1.499 m)    Weight: Wt Readings from Last 1 Encounters:  01/11/14 105 lb 11.2 oz (47.945 kg)    Ideal Body Weight: 95 pounds  % Ideal Body Weight: 111%  Wt Readings from Last 10 Encounters:  01/11/14 105 lb 11.2 oz (47.945 kg)  05/30/13 100 lb (45.36 kg)  05/30/13 100 lb (45.36 kg)  02/17/13 95 lb (43.092 kg)  08/04/12 96 lb (43.545 kg)  07/22/12 102 lb 15.3 oz (46.7 kg)  04/07/12 98 lb (44.453 kg)  06/11/10 95 lb (43.092 kg)    Usual Body Weight: 95-100 pounds  % Usual Body Weight: 105%  BMI:  Body mass index is 21.34 kg/(m^2). Patient is normal weight.   Estimated Nutritional Needs: Kcal: 1100-1300 kcal Protein: 45-55 g Fluid: >1.7 L/day  Skin: intact  Diet Order: NPO  EDUCATION NEEDS: -No education needs identified at this time   Intake/Output Summary (Last 24 hours) at  01/12/14 1214 Last data filed at 01/12/14 1028  Gross per 24 hour  Intake    162 ml  Output      0 ml  Net    162 ml    Last BM: PTA   Labs:   Recent Labs Lab 01/11/14 1202 01/12/14 0650  NA 142 136*  K 4.8 4.9  CL 101 100  CO2 31 27  BUN 12 10  CREATININE 0.61 0.55  CALCIUM 9.0 8.6  GLUCOSE 64* 75    CBG (last 3)   Recent Labs  01/11/14 1419 01/11/14 1458 01/11/14 1817  GLUCAP 57* 79 74    Scheduled Meds: . dextrose  25 g Intravenous Once  . enoxaparin (LOVENOX) injection  40 mg Subcutaneous Q24H  . feeding supplement (JEVITY 1.2 CAL)  237 mL Per Tube TID  . lacosamide  50 mg Per Tube BID  . levETIRAcetam  1,250 mg Per Tube BID  . levothyroxine  88 mcg Per Tube QAC breakfast  . loratadine  10 mg Per Tube Daily  . ranitidine  75 mg Per Tube BID  . sodium chloride  3 mL Intravenous Q12H  . trimethoprim-polymyxin b  2 drop Left Eye 6 times per day  . Valproic Acid  650 mg Per Tube QID    Continuous Infusions: . dextrose 5 % and 0.45% NaCl 50 mL/hr at 01/12/14 0016    Past Medical History  Diagnosis Date  . Cerebral palsy, quadriplegic   . Cortical  blindness   . Seizures   . Mental retardation   . Cerebral palsy   . Hypertension 06/23/2010  . Vision abnormalities   . Allergic rhinitis 11/04/2012  . CHF (congestive heart failure)   . COPD (chronic obstructive pulmonary disease)   . Pneumonia December 2014    Was intubated, [facility]  . Dilantin toxicity August 2014    Past Surgical History  Procedure Laterality Date  . Peg tube placement    . Peg placement  09/03/2011    Procedure: PERCUTANEOUS ENDOSCOPIC GASTROSTOMY (PEG) REPLACEMENT;  Surgeon: Jerene Bears, MD;  Location: [facility];  Service: Gastroenterology;  Laterality: N/A;  . Peg placement  02/19/2012    Procedure: PERCUTANEOUS ENDOSCOPIC GASTROSTOMY (PEG) REPLACEMENT;  Surgeon: Lafayette Dragon, MD;  Location: [facility];  Service: Endoscopy;  Laterality: N/A;  needs gastrostomy button 24  Fr 4.4 and 3.5 available  . Peg placement N/A 05/30/2013    Procedure: PERCUTANEOUS ENDOSCOPIC GASTROSTOMY (PEG) REPLACEMENT;  Surgeon: Jerene Bears, MD;  Location: [place];  Service: Gastroenterology;  Laterality: N/A;  . Peg placement N/A 07/03/2013    Procedure: PERCUTANEOUS ENDOSCOPIC GASTROSTOMY (PEG) REPLACEMENT;  Surgeon: Lafayette Dragon, MD;  Location: [facility];  Service: Endoscopy;  Laterality: N/A;  Replace with 20 Fr x 3.5 cm mini button-    Larey Seat, MS, RD, LDN

## 2014-01-12 NOTE — Progress Notes (Signed)
FMTS ATTENDING  NOTE Evea Sheek,MD I  have seen and examined this patient, reviewed their chart. I have discussed this patient with the resident. I agree with the resident's findings, assessment and care plan. 

## 2014-01-12 NOTE — H&P (Signed)
FMTS ATTENDING ADMISSION NOTE  Megan Hixon,MD  I have seen and examined this patient, reviewed their chart. I have discussed this patient with the resident. I agree with the resident's findings, assessment and care plan.  33 Y/O F with PMX of Seizure disorder, cerebral palsy,B/L blindness,hypothyroidism, presented with hx of seizure which started at about 2 am this morning and has been recurrent every few min. As per her grandmother she is compliant with her home seizure medication, a new medication was added to her regimen recently by the neurologist. She is seen by [name] group. In the last few week she has had multiple episodes of brief seizures but this is worse.  No current facility-administered medications on file prior to encounter.    Current Outpatient Prescriptions on File Prior to Encounter   Medication  Sig  Dispense  Refill   .  levETIRAcetam (KEPPRA) 100 MG/ML solution  Give 12.5 mls in the morning and 12.5 mls in the evening by peg tube  850 mL  5   .  levothyroxine (SYNTHROID, LEVOTHROID) 88 MCG tablet  Take 1 tablet (88 mcg total) by mouth daily.  30 tablet  11   .  loratadine (CLARITIN) 5 MG/5ML syrup  Take 10 mLs (10 mg total) by mouth daily.  300 mL  12   .  Nutritional Supplements (JEVITY) LIQD  1 Can by Per J Tube route 3 (three) times daily.  90 Can  11   .  ranitidine (ZANTAC) 75 MG/5ML syrup  5 mLs (75 mg total) by Per J Tube route 2 (two) times daily.  480 mL  5   .  trimethoprim-polymyxin b (POLYTRIM) ophthalmic solution  Place 2 drops into the left eye every 4 (four) hours.  10 mL  0   .  valproate (DEPAKENE) 250 MG/5ML syrup  Give 13 ml by peg tube every 6 hours  1600 mL  5   .  VIMPAT 10 MG/ML SOLN  Give 22ml in the morning and 61ml in the evening  310 mL  5   .  AMBULATORY NON FORMULARY MEDICATION  Abdominal binder  1 packet  1    Past Medical History   Diagnosis  Date   .  Cerebral palsy, quadriplegic    .  Cortical blindness    .  Seizures    .  Mental  retardation    .  Cerebral palsy    .  Hypertension  06/23/2010   .  Vision abnormalities    .  Allergic rhinitis  11/04/2012   .  CHF (congestive heart failure)    .  COPD (chronic obstructive pulmonary disease)    .  Pneumonia  December 2014     Was intubated, [facility]   .  Dilantin toxicity  August 2014    Filed Vitals:    01/11/14 1330  01/11/14 1345  01/11/14 1400  01/11/14 1518   BP:    109/79  125/89   Pulse:  101  84  81  97   Resp:  17  14  13  14    Height:       Weight:       SpO2:  100%  100%  100%  100%   Exam:  Gen: Sleeping in bed S/P Ativan. Not in distress.  Neuro: Could not fully assess since she was sleeping. Easily arousable. Contracted extremities,will reexamine later.  Resp: Air entry equal and clear B/L  CV: S1 S2 normal no murmurs.  Abd: Soft, ND/NT, BS+  Ext: Mildly puffy feet,acyanotic.  A/P: 33 Y/O F with  1. Recurrent seizures: Compliant with home regimen.  Restarted back on Levetiracetam,Primidone, Valproic and Lacosamide.  Serum Valproic slightly supratherapeutic.  I reviewed last EKG report as documented on Epic as well as Dr [employer] phone note today.  72 hr EEG report: muscle artifact,episodic frontally predominant rhythmic generalized activity with one event representing seizure.  [name] recommended prolonged EEG which can can get during this hospitalization.  Resident to order EEG and consult neurologist/Dr Hickling.  Continue home seizure regimen.  Seizure precaution.  2. Hypoglycemia: CBG low on admission now back to normal.  Might be related to post seizure hypoglycemia.  Monitor glucose level.  3. Dysphagia:  Chest xray cannot r/o aspiration.  Consider repeat in AM.  Continue PEG tube feeding.  4. Hypothyroidism: COntinue home regimen.  Check TSH.

## 2014-01-13 DIAGNOSIS — G809 Cerebral palsy, unspecified: Secondary | ICD-10-CM

## 2014-01-13 DIAGNOSIS — E039 Hypothyroidism, unspecified: Secondary | ICD-10-CM

## 2014-01-13 DIAGNOSIS — G40209 Localization-related (focal) (partial) symptomatic epilepsy and epileptic syndromes with complex partial seizures, not intractable, without status epilepticus: Secondary | ICD-10-CM

## 2014-01-13 MED ORDER — VIMPAT 10 MG/ML PO SOLN: ORAL | Status: DC

## 2014-01-13 MED ORDER — INFLUENZA VAC SPLIT QUAD 0.5 ML IM SUSY: 0.5000 mL | PREFILLED_SYRINGE | INTRAMUSCULAR | Status: DC

## 2014-01-13 MED ORDER — PNEUMOCOCCAL VAC POLYVALENT 25 MCG/0.5ML IJ INJ
0.5000 mL | INJECTION | INTRAMUSCULAR | Status: DC
  Filled 2014-01-13: qty 0.5

## 2014-01-13 NOTE — Procedures (Signed)
Patient: Megan [name] MRN: [id number] Sex: female DOB: Sep 10, 1980  Clinical History: Megan [name] is a 33 y.o. 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  This was a portable EEG with video recording.  The patient was awake, drowsy and asleep during the recording.  The international 10/20 system lead placement used.  Recording time 21 hours 47 minutes.   Description of Findings: Dominant frequency is 20 V, 13 Hz, alpha range activity that was broadly and symmetrically distributed.    Background activity consists of mixed frequency alpha and theta range activity of under 15 V with superimposed generalized delta range activity of 20 V.  Frequent frontally predominant generalized sharply contoured slow waves and spike and slow-wave discharges were seen however these were isolated and interictal.  With behavioral sleep the patient showed rhythmic delta range activity that was particularly prominent in the frontal regions.  Sleep spindles were seen in the central regions; vertex sharp waves were not often seen.  Delta range activity was approximately 2 Hz. and 30-35 V.  On 3 occasions the patient had elevation of her arms into a decorticate posture, the first bilateral and the next 2 involved the right arm.  The last episode was associated with twitching of the right arm with decorticate posture.  None of these episodes were associated with ictal activity.  There is only muscle artifact superimposed upon the background that was unchanged before and after the episodes.  Activating procedures including intermittent photic stimulation, and hyperventilation  were not performed.  EKG showed a regular sinus rhythm with a ventricular response of 78-90 beats per minute.  Impression: This is a abnormal record with the patient awake, drowsy and asleep.  Background activity shows upper alpha lower beta range activity that does not change with eye opening and superimposed slow-wave activity that is characteristic of static encephalopathy.  The patient has interictal activity that consists of generalized and frontally predominant sharp waves and spike and slow-waves that are epileptogenic from an electrographic viewpoint .  However no clinical seizures with electrographic correlates were seen over a 21 hour study.  This information was conveyed to the family practice and neurohospitalist service shortly after 3 PM on September 19.  Wyline Copas, MD

## 2014-01-13 NOTE — Progress Notes (Signed)
LTM EEG D/C'd per [name]. Results pending.

## 2014-01-13 NOTE — Discharge Instructions (Signed)
Megan [name] had no seizures while admitted. She had a prolonged EEG which was negative.  No witnessed seizure activity was noted.  The times where she elevates her arms/shruggs is not secondary to seizure activity. During her hospitalization the Primidone was stopped. Please discontinue this medication.  She can continue the remainder of her home medications. Please followup closely with her primary care provider as well as her neurologist Dr. Gaynell Face.

## 2014-01-13 NOTE — Progress Notes (Signed)
1810 - Discharge instructions and prescription given to pt's grandmother and cousin. Family members verbally acknowledged understanding. Family members voiced no questions when prompted. Both iv catheters removed by nurse without difficulty, intact. Pt tolerated well. Pt to be transported home via private car and by wheelchair to lobby. Will monitor   Angeline Slim I 01/13/2014 6:35 PM

## 2014-01-13 NOTE — Progress Notes (Signed)
FMTS ATTENDING  NOTE Ronal Maybury,MD I  have seen and examined this patient, reviewed their chart. I have discussed this patient with the resident. I agree with the resident's findings, assessment and care plan. 

## 2014-01-13 NOTE — Progress Notes (Signed)
Pt's grandmother refused vaccinations. Nurse provided education  Angeline Slim I 01/13/2014 6:07 PM

## 2014-01-13 NOTE — Progress Notes (Signed)
Family Medicine Teaching Service Daily Progress Note Intern Pager: [phone]  Patient name: Megan [name] Medical record number: [id number] Date of birth: 1980/11/30 Age: 33 y.o. Gender: female  Primary Care Provider: Beverlyn Roux, MD Consultants: Neurology Code Status: Full  Pt Overview and Major Events to Date:  9/17 - Admitted with recurrent seizures 9/18- 24 hr EEG initiated  Assessment and Plan: Megan [name] is a 33 y.o. female presenting with recurrent seizures. PMH is significant for cerebral palsy, seizures (minor motor and generalized tonic-clonic), intellectual disability, cortical blindness, dysphagia with PEG tube in place, and hypothyroidism.   Recurrent seizures in setting of known seizure disorder: History of minor motor and generalized tonic-clonic seizures No evidence of physiologic stressor that would provoke recent seizures at this point. Reportedly compliant with medications.  - Neurology consulted, appreciate recommendations.  - Keppra 1250mg  BID, vimpat 50 BID, valproic acid 650 QID - Most recent EEG (07/2013): Low-voltage slowing, which is nonspecific and consistent with patient's static encephalopathy -> 24 hr in process  - Seizure precautions   Hypoglycemia. CBG 57 in ED, likely related to continued seizure activity. Improved with D50. Now >90 - Will continue to monitor closely.   Cerebral palsy, profound intellectual disability: Grandmother reports patient is at her baseline between seizures. She is nonverbal and unable to complete any ADLs without assistance.   Dysphagia, PEG tube in place: PEG tube securely in place without issue.  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  - Continue home polytrim   Hypothyroidism: Most recent TSH in 09/2010 was within normal limits.  - TSH 0.488 - Continue home synthroid   FEN/GI: NPO, everything through PEG; IV fluids D5 74mL/h  Prophylaxis: SubQ heparin    Disposition: Pending continued evaluation and resolution of seizures.  Subjective: Lying in bed with electrode on head; no concerns voiced from nursing  Objective: Temp:  [97 F (36.1 C)-98.2 F (36.8 C)] 97.3 F (36.3 C) (09/19 0518) Pulse Rate:  [76-91] 86 (09/19 0518) Resp:  [18-20] 18 (09/19 0518) BP: (94-114)/(45-77) 114/77 mmHg (09/19 0518) SpO2:  [94 %-100 %] 94 % (09/19 0518) Physical Exam: General: Lying in bed asleep, nonverbal, globally delayed at baseline HEENT: protruding tongue  Cardiovascular: RRR, 2/6 systolic murmur at LUSB Respiratory: CTAB. Upper airway noises transmitted Abdomen: Soft, nontender, nondistended. PEG tube c/d/i Extremities: No cyanosis or edema; contractures worse in legs (L>R)  Laboratory:  Recent Labs Lab 01/11/14 1202 01/12/14 0650  WBC 6.4 7.8  HGB 12.2 11.9*  HCT 36.9 36.3  PLT 207 185    Recent Labs Lab 01/11/14 1202 01/12/14 0650  NA 142 136*  K 4.8 4.9  CL 101 100  CO2 31 27  BUN 12 10  CREATININE 0.61 0.55  CALCIUM 9.0 8.6  GLUCOSE 64* 75    TSH 0.488  Imaging/Diagnostic Tests: Dg Chest Port 1 View 01/11/2014    IMPRESSION: There is mildly increased density at the right lung base which may reflect subsegmental atelectasis. This is likely related to the patient's hypo inflation but aspiration is not absolutely excluded. When the patient can tolerate the procedure, a PA and lateral chest x-ray with deep inspiration would be useful.     Bernadene Bell, MD 01/13/2014, 8:39 AM PGY-2, [facility] Intern pager: [phone], text pages welcome

## 2014-01-13 NOTE — Progress Notes (Signed)
Subjective: No recurrent seizure activity reported. Patient had no reported adverse clinical events overnight, otherwise.  Objective: Current vital signs: BP 128/66  Pulse 95  Temp(Src) 97.4 F (36.3 C) (Axillary)  Resp 18  Ht 4\' 11"  (1.499 m)  Wt 47.945 kg (105 lb 11.2 oz)  BMI 21.34 kg/m2  SpO2 95%  Neurologic Exam: Patient was sleeping and was difficult to arouse. She reacted to verbal and ductal stimulation with partial eye-opening only. She was in no acute distress.  Overnight prolonged EEG with video monitoring was completed. See Dr.  Melanee Left detail report and make dated 01/13/2014.  Medications: I have reviewed the patient's current medications.  Assessment/Plan: Patient's management was discussed with Dr.  Gaynell Face. He recommended no change in current management with continuing Depakote, Keppra and Vimpat at current doses. Patient can be discharged at this point with followup arranged with Dr. Melanee Left office.  C.R. Nicole Kindred, MD [facility] [phone]  01/13/2014  5:40 PM

## 2014-01-14 LAB — LEVETIRACETAM LEVEL: Levetiracetam Lvl: 67.4 ug/mL

## 2014-01-15 NOTE — Discharge Summary (Signed)
FMTS ATTENDING  NOTE Megan Rommel,MD I  have seen and examined this patient, reviewed their chart. I have discussed this patient with the resident. I agree with the resident's findings, assessment and care plan. 

## 2014-01-17 LAB — PRIMIDONE AND METABOLITE LEVEL: Phenobarbital: 20.2 mg/L (ref 15.0–40.0)

## 2014-01-26 ENCOUNTER — Other Ambulatory Visit: Payer: Self-pay | Admitting: Family

## 2014-01-26 ENCOUNTER — Ambulatory Visit
Admission: RE | Admit: 2014-01-26 | Discharge: 2014-01-26 | Disposition: A | Discharge status: Home / Self Care | Payer: Medicaid Other | Source: Ambulatory Visit | Attending: Family Medicine | Admitting: Family Medicine

## 2014-01-26 ENCOUNTER — Encounter: Payer: Self-pay | Admitting: Family Medicine

## 2014-01-26 ENCOUNTER — Ambulatory Visit (INDEPENDENT_AMBULATORY_CARE_PROVIDER_SITE_OTHER): Payer: Medicaid Other | Admitting: Family Medicine

## 2014-01-26 ENCOUNTER — Other Ambulatory Visit: Payer: Self-pay | Admitting: Family Medicine

## 2014-01-26 VITALS — BP 116/82 | HR 90 | Temp 98.3°F

## 2014-01-26 DIAGNOSIS — R059 Cough, unspecified: Secondary | ICD-10-CM

## 2014-01-26 DIAGNOSIS — R05 Cough: Secondary | ICD-10-CM

## 2014-01-26 DIAGNOSIS — G40909 Epilepsy, unspecified, not intractable, without status epilepticus: Secondary | ICD-10-CM

## 2014-01-26 DIAGNOSIS — Z23 Encounter for immunization: Secondary | ICD-10-CM

## 2014-01-26 MED ORDER — DIAZEPAM 10 MG RE GEL: 5.0000 mg | Freq: Once | RECTAL | Status: DC

## 2014-01-26 NOTE — Patient Instructions (Signed)
I will call you when I get the results of [name]'s xray. If it looks like a pneumonia I will call in an antibiotic.  Someone will call in a few days to let you know the forms are ready to be picked up.

## 2014-02-06 NOTE — Assessment & Plan Note (Signed)
Likely viral URI vs. Aspiration pneumonia - given chronic medical problems and frequent serious pneumonias will repeat CXR to assess change from in hospital (read as likely atelectasis)

## 2014-02-06 NOTE — Progress Notes (Signed)
   Subjective:    Patient ID: Megan [name], female    DOB: [birth year], 33 y.o.   MRN: [id number]  HPI Pt presents for hospital follow up. Pt was hospitalized for prolonged and repeated seizures. Vimpat was added per neuro while in hospital. There was some concern for aspiration during her seizure and a CXR looked more consistent with atelectasis so she did not get abx. Since discharge she has been coughing some but has not appeared sick otherwise and has had no further seizure activity. No fevers or increased WOB.   Review of Systems See HPI    Objective:   Physical Exam  Nursing note and vitals reviewed. Constitutional: She appears well-developed and well-nourished. No distress.  HENT:  Head: Normocephalic and atraumatic.  Cardiovascular: Normal rate, regular rhythm, normal heart sounds and intact distal pulses.   No murmur heard. Pulmonary/Chest: Effort normal. No respiratory distress.  Diffuse wheeze possible vs. transmitted upper-airway sounds  Abdominal: Soft. Bowel sounds are normal. She exhibits no distension.  Musculoskeletal:  Diffuse rigidity  Skin: Skin is warm and dry. No rash noted. She is not diaphoretic.          Assessment & Plan:

## 2014-02-06 NOTE — Assessment & Plan Note (Addendum)
Vimpat added in hospital per [facility] - refilled depakene today - gave rectal diastat on family request for prolonged or recurrent seizures

## 2014-02-12 ENCOUNTER — Ambulatory Visit (INDEPENDENT_AMBULATORY_CARE_PROVIDER_SITE_OTHER): Payer: Medicaid Other | Admitting: Pediatrics

## 2014-02-12 ENCOUNTER — Encounter: Payer: Self-pay | Admitting: Pediatrics

## 2014-02-12 ENCOUNTER — Telehealth: Payer: Self-pay | Admitting: *Deleted

## 2014-02-12 VITALS — BP 86/64 | HR 84

## 2014-02-12 DIAGNOSIS — R1310 Dysphagia, unspecified: Secondary | ICD-10-CM

## 2014-02-12 DIAGNOSIS — G8389 Other specified paralytic syndromes: Secondary | ICD-10-CM

## 2014-02-12 DIAGNOSIS — G40311 Generalized idiopathic epilepsy and epileptic syndromes, intractable, with status epilepticus: Secondary | ICD-10-CM

## 2014-02-12 DIAGNOSIS — G40209 Localization-related (focal) (partial) symptomatic epilepsy and epileptic syndromes with complex partial seizures, not intractable, without status epilepticus: Secondary | ICD-10-CM

## 2014-02-12 DIAGNOSIS — G40319 Generalized idiopathic epilepsy and epileptic syndromes, intractable, without status epilepticus: Secondary | ICD-10-CM

## 2014-02-12 DIAGNOSIS — R569 Unspecified convulsions: Secondary | ICD-10-CM

## 2014-02-12 DIAGNOSIS — G8250 Quadriplegia, unspecified: Secondary | ICD-10-CM

## 2014-02-12 DIAGNOSIS — H47619 Cortical blindness, unspecified side of brain: Secondary | ICD-10-CM

## 2014-02-12 MED ORDER — LEVETIRACETAM 100 MG/ML PO SOLN: ORAL | Status: DC

## 2014-02-12 NOTE — Progress Notes (Signed)
Patient: Megan [name] MRN: [id number] Sex: female DOB: [birth year]  Provider: Jodi Geralds, MD Location of Care: [facility]  Note type: Urgent return visit following hospitalization  History of Present Illness: Referral Source: [place]  History from: cousin, hospital chart and [facility] chart Chief Complaint: Hospital Follow Up/Seizures   Megan [name] is a 33 y.o. female who was evaluated on February 12, 2014.  Her last office visit was on August 23, 2013.  She was recently admitted to [facility] on September 17th through 19th for seizures.  Vimpat was added to her medications and primidone was discontinued.  Over the past month, she has done well.  She was here today with her cousin who was one of her CAPS workers.  She goes to school at [employer].  Her cousin says that she seems to be happier and more alert.  I have not seen a while, but she did seem to be less agitated.  She is fed and takes medication by gastrostomy tube and nothing by mouth.  Her sleep is variable.  There are occasions when she sleeps all day and then to sleep all night.  She has a hospital bed and a wheelchair.  She had two prolonged EEGs, one of them ambulatory at home that did not show definite clinical seizure activity, but suggested nocturnal seizure activity that was intermittent and without obvious clinical accompaniments.  When she was hospitalized a 17-hour video EEG showed characteristic lifting of her arms and twisting of her head which has been assumed to be a seizure, however, no seizure activity was seen.  Review of Systems: 12 system review was remarkable for seizure   Past Medical History Diagnosis Date  . Cerebral palsy, quadriplegic   . Cortical blindness   . Seizures   . Mental retardation   . Cerebral palsy   . Hypertension 06/23/2010  . Vision abnormalities   . Allergic rhinitis 11/04/2012  . CHF (congestive heart failure)   . COPD (chronic  obstructive pulmonary disease)   . Pneumonia December 2014    Was intubated, [facility]  . Dilantin toxicity August 2014   Hospitalizations: Yes.  , Head Injury: No., Nervous System Infections: No., Immunizations up to date: Yes.    Hospitalized January 11, 2014 for 2 days due to seizure activity.   Behavior History none  Surgical History Procedure Laterality Date  . Peg tube placement    . Peg placement  09/03/2011    Procedure: PERCUTANEOUS ENDOSCOPIC GASTROSTOMY (PEG) REPLACEMENT;  Surgeon: Jerene Bears, MD;  Location: [facility];  Service: Gastroenterology;  Laterality: N/A;  . Peg placement  02/19/2012    Procedure: PERCUTANEOUS ENDOSCOPIC GASTROSTOMY (PEG) REPLACEMENT;  Surgeon: Lafayette Dragon, MD;  Location: [facility];  Service: Endoscopy;  Laterality: N/A;  needs gastrostomy button 24 Fr 4.4 and 3.5 available  . Peg placement N/A 05/30/2013    Procedure: PERCUTANEOUS ENDOSCOPIC GASTROSTOMY (PEG) REPLACEMENT;  Surgeon: Jerene Bears, MD;  Location: [place];  Service: Gastroenterology;  Laterality: N/A;  . Peg placement N/A 07/03/2013    Procedure: PERCUTANEOUS ENDOSCOPIC GASTROSTOMY (PEG) REPLACEMENT;  Surgeon: Lafayette Dragon, MD;  Location: [facility];  Service: Endoscopy;  Laterality: N/A;  Replace with 20 Fr x 3.5 cm mini button-    Family History family history includes Cancer in her maternal grandfather; Diabetes in her maternal aunt, maternal grandmother, and mother; Hypertension in her other; Stroke in her maternal grandmother. Family history is negative for migraines,  seizures, intellectual disabilities, blindness, deafness, birth defects, chromosomal disorder, or autism.  Social History . Marital Status: Single    Spouse Name: N/A    Number of Children: N/A  . Years of Education: N/A   Social History Main Topics  . Smoking status: Passive Smoke Exposure - Never Smoker  . Smokeless tobacco: Never Used  . Alcohol Use: No  . Drug Use: No  . Sexual Activity: No    Other Topics Concern  . None   Social History Narrative  Educational level special education Living with maternal grandmother who is her legal guardian   Hobbies/Interest: Enjoys going to [employer] comments: Damiyah goes to [employer].   Allergies  Allergen Reactions  . Doxycycline Rash    REACTION: Blisters / swelling    Physical Exam BP 86/64  Pulse 84  LMP 01/30/2014  General: Well developed young woman, seated in wheelchair  Head: Normocephalic and atraumatic  Ears, Nose and Throat: Tympanic membranes partially occluded by cerumen bilaterally. Unable to fully examine her posterior pharynx due to patient's inability to cooperate. Her tongue protrudes.  Neck: supple, unable to maintain head control  Respiratory: lungs clear to auscultation  Cardiovascular: regular rate and rhythm. No murmurs  Musculoskeletal: contractures of extremities at the knees and elbows. she has hypotonia in her trunk  Skin: intact, no rashes or lesions  Trunk: PEG tube intact   Neurologic Exam  Mental Status: Awake. She smiles at times when her family interacts with her. Evidence of profound intellectual delay. She has no speech.  Cranial Nerves: Fundoscopic exam reveals poorly visualized disc margins. Pupils equal, briskly reactive to light. Unable to evaluate EOM's due to her inability to follow instructions. She has dysconjugate eye movements. Does not fix and follow on objects in her visual field. She turns to localize sounds but does not do so consistently. She has lower facial weakness with drooling. Her tongue protrudes at all times. Unable to adequately evaluate neck flexion and extension.  Motor: She has spastic quadriplegia  Sensory: Withdraws to noxious stimuli  Coordination: Cannot cooperate with exam  Gait and Station: wheelchair bound, does not ambulate  Reflexes: diminished throughout   Assessment 1. 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 2. 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 3. 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 4. Dysphagia, R13.10. 5. 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  Plan Continue levetiracetam, valproic acid, and Zantac.  There is no reason to make changes in her current treatment because she seems to be doing well.  I made certain that her cousin knows that the episodes that have been called seizures are not.  I want her to make certain that the school knows that.  Physically, [name] is stable.  I will plan to see her in six months.  I spent 30 minutes of face-to-face time with [name] and her cousin, more than half of it in consultation.   Medication List     This list is accurate as of: 02/12/14 11:14 AM.         AMBULATORY NON FORMULARY MEDICATION  Abdominal binder     diazepam 10 MG Gel  Commonly known as:  DIASTAT ACUDIAL  Place 5 mg rectally once.     JEVITY Liqd  1 Can by Per J Tube route 3 (three) times daily.     levETIRAcetam 100 MG/ML solution  Commonly known as:  KEPPRA  Give 12.5 mls in the morning and  12.5 mls in the evening by peg tube     levothyroxine 88 MCG tablet  Commonly known as:  SYNTHROID, LEVOTHROID  Take 1 tablet (88 mcg total) by mouth daily.     loratadine 5 MG/5ML syrup  Commonly known as:  CLARITIN  Take 10 mLs (10 mg total) by mouth daily.     ranitidine 75 MG/5ML syrup  Commonly known as:  ZANTAC  5 mLs (75 mg total) by Per J Tube route 2 (two) times daily.     trimethoprim-polymyxin b ophthalmic solution  Commonly known as:  POLYTRIM  Place 2 drops into the left eye every 4 (four) hours.     Valproic Acid 250 MG/5ML Syrp syrup  Commonly known as:  DEPAKENE  GIVE 13 ML BY PEG TUBE EVERY 6 HOURS     VIMPAT 10 MG/ML Soln  Generic drug:  Lacosamide  Give 55ml in the morning and 59ml in the evening      The medication list was reviewed and  reconciled. All changes or newly prescribed medications were explained.  A complete medication list was provided to the patient/caregiver.  Jodi Geralds MD

## 2014-02-12 NOTE — Patient Instructions (Signed)
In the ambulatory EEG performed at her home there were silent seizures that occurred during sleep that were not associated with any described behavior.  In the hospital in the video EEG there were 3 episodes where her she elevated her arms and twisted her head; behavior that has been thought to represent seizures.  No seizure activity was seen simultaneously on the EEG.  Please share this with the caregivers at [facility].  Since she was hospitalized, she seems to be doing better on a combination of levetiracetam, divalproex, and Vimpat.  These will not be changed.

## 2014-02-12 NOTE — Telephone Encounter (Signed)
Patient needs refill on Levetiracetam 100 mg/ml Sig: Take 12.5 ml po qam and 12.5 ml q evening by peg tube. Patient was seen today pharmacy info in chart is correct. MB

## 2014-02-28 ENCOUNTER — Telehealth: Payer: Self-pay | Admitting: Family Medicine

## 2014-02-28 NOTE — Telephone Encounter (Signed)
Spoke with patient's caregiver regarding a form Dr. Sherril Cong was to have completed by Oct 19th.  Patient was seen on 10/2 when caregiver tried to have form completed at visit. Per caregiver, provider said it could not be completed at the visit because it would take too long and she would complete it and call when ready.  Due to form not being in house and ready for pickup and no one called to inform where or what was done, caregiver is making a complaint regarding this.  Asked for a number to the hospital to talk to someone there about the situation.Liana Gerold to have the Medical Director speak with her this afternoon and caregiver declined this option.

## 2014-03-01 NOTE — Telephone Encounter (Signed)
Spoke with patient's cousin and apologized for the delay.   Dr. Sherril Cong did contact the family and completed the forms.

## 2014-04-18 ENCOUNTER — Ambulatory Visit
Admission: RE | Admit: 2014-04-18 | Discharge: 2014-04-18 | Disposition: A | Discharge status: Home / Self Care | Payer: Medicaid Other | Source: Ambulatory Visit | Attending: Family Medicine | Admitting: Family Medicine

## 2014-04-18 ENCOUNTER — Telehealth: Payer: Self-pay | Admitting: Family Medicine

## 2014-04-18 ENCOUNTER — Ambulatory Visit (INDEPENDENT_AMBULATORY_CARE_PROVIDER_SITE_OTHER): Payer: Medicaid Other | Admitting: Family Medicine

## 2014-04-18 ENCOUNTER — Other Ambulatory Visit: Payer: Self-pay | Admitting: Family Medicine

## 2014-04-18 DIAGNOSIS — R059 Cough, unspecified: Secondary | ICD-10-CM

## 2014-04-18 DIAGNOSIS — R05 Cough: Secondary | ICD-10-CM

## 2014-04-18 MED ORDER — GUAIFENESIN 200 MG PO TABS: 200.0000 mg | ORAL_TABLET | ORAL | Status: DC | PRN

## 2014-04-18 NOTE — Assessment & Plan Note (Addendum)
Cough for 1-2 weeks, no increased WOB or fevers Lungs clear, non labored Eval with CXR, but likely viral Mucinex for cough Discussed red flags and reasons for return at length

## 2014-04-18 NOTE — Telephone Encounter (Signed)
Pt informed that dr Wendi Snipes did resend it again. Megan [name] C

## 2014-04-18 NOTE — Addendum Note (Signed)
Addended by: Timmothy Euler on: 04/18/2014 04:10 PM   Modules accepted: Orders

## 2014-04-18 NOTE — Telephone Encounter (Signed)
Pt called because the pharmacy said that we didn't call in her medication today 12/23. We sent early this morning. Can we call again please. jw

## 2014-04-18 NOTE — Telephone Encounter (Signed)
Called and explained CXR without acute finding. Continue supportive Tx with mucinex and low threshold for f/u.   Laroy Apple, MD [facility] Resident, PGY-3 04/18/2014, 1:27 PM

## 2014-04-18 NOTE — Progress Notes (Signed)
Patient ID: Hebah Bogosian, female   DOB: Mar 27, 1981, 33 y.o.   MRN: [id number]   HPI  Patient presents today for off  Patient's sister explains that for the last 1-2 weeks she's had nonproductive cough. She says over the last 2-3 days she's been less active at school and a little bit more sleepy than usual. They deny fevers, tolerance of G-tube feedings, or increased work of breathing. They also note that she has congestion for the same 2 weeks.  At school the school nurse listen to her and stated that her lungs sounded rattly.  They're concerned that she has pneumonia as she had a very serious pneumonia around this time last year.  Smoking status noted ROS: Per HPI  Objective: BP 109/73 mmHg  Pulse 92  Temp(Src) 98.2 F (36.8 C) (Oral)  Wt  Gen: NAD, alert, nonverbal and not interactive consistent with severe cerebral palsy HEENT: NCAT, moist mucous membranes, nares clear bilaterally CV: RRR, good S1/S2, no murmur Resp: CTABL, no wheezes, non-labored, shallow breaths as patient can't contribute deep breathing on command Abd: SNTND, BS present, no guarding or organomegaly, G-tube site without erythema or tenderness Ext: No edema, warm  Assessment and plan:  Cough Cough for 1-2 weeks, no increased WOB or fevers Lungs clear, non labored Eval with CXR, but likely viral Mucinex for cough Discussed red flags and reasons for return at length    Orders Placed This Encounter  Procedures  . DG Chest 2 View    Standing Status: Future     Number of Occurrences:      Standing Expiration Date: 06/20/2015    Order Specific Question:  Reason for Exam (SYMPTOM  OR DIAGNOSIS REQUIRED)    Answer:  Cough, eval for CAP    Order Specific Question:  Is the patient pregnant?    Answer:  No    Order Specific Question:  Preferred imaging location?    Answer:  [facility]    Meds ordered this encounter  Medications  . guaiFENesin 200 MG tablet    Sig: Take 1 tablet (200 mg  total) by mouth every 4 (four) hours as needed for cough or to loosen phlegm.    Dispense:  30 suppository    Refill:  0

## 2014-04-18 NOTE — Patient Instructions (Signed)
Great to meet you guys!  I am sorry she is sick, it is not clear if this is due to bacteria or a virus. It appears tio be due to a virus but I am going to check with an xray to make sure she doesn't need an antibiotic. PLease get it early today so I can follow up with you by the end of the day.   If it is a virus we just need to give her time and cough medicine.   If she develops difficulty breathing, gets more sleepy than usual, or develops fevers please get re-evaluated by a medical provider.

## 2014-05-22 ENCOUNTER — Telehealth: Payer: Self-pay | Admitting: Family Medicine

## 2014-05-22 ENCOUNTER — Encounter: Payer: Self-pay | Admitting: Family Medicine

## 2014-05-22 ENCOUNTER — Ambulatory Visit (INDEPENDENT_AMBULATORY_CARE_PROVIDER_SITE_OTHER): Payer: Medicaid Other | Admitting: Family Medicine

## 2014-05-22 VITALS — BP 119/80 | HR 102 | Temp 98.7°F | Ht 59.0 in | Wt 105.0 lb

## 2014-05-22 DIAGNOSIS — L089 Local infection of the skin and subcutaneous tissue, unspecified: Secondary | ICD-10-CM

## 2014-05-22 MED ORDER — SULFAMETHOXAZOLE-TRIMETHOPRIM 800-160 MG PO TABS: 1.0000 | ORAL_TABLET | Freq: Two times a day (BID) | ORAL | Status: DC

## 2014-05-22 MED ORDER — CIPROFLOXACIN HCL 500 MG PO TABS: 500.0000 mg | ORAL_TABLET | Freq: Two times a day (BID) | ORAL | Status: DC

## 2014-05-22 NOTE — Progress Notes (Signed)
   Subjective:    Patient ID: Megan [name], female    DOB: [birth year], 34 y.o.   MRN: [id number]  Patient presents for a same day appointment. Accompanied by family caregivers who provided history as patient is non-verbal.  HPI  LEFT TOE INFECTION: - PMH CP, severe intellectual disability, non-verbal, wheel-chair bound - Family reported that they have noticed Megan [name] has a Left great toe infection under her toenail that seems to be worsening over past 1.5 weeks. Stated that it initially came to their attention about 1.5 weeks ago after she returned from her school/day program, was told she had some "bleeding" from her Left toenail, unclear initial injury or trauma. Caregivers question if she had nails routinely trimmed recently. Seems to be worsening with some redness, discharge vs pus drainage, and seems to be significantly causing her pain / distress. - No history of prior ingrown toenail or similar infections - Denies any fevers, spreading redness or swelling to other toes or foot, recent illness, nausea / vomiting  I have reviewed and updated the following as appropriate: allergies and current medications  Social Hx: - Never smoker  Review of Systems  See above HPI    Objective:   Physical Exam  BP 119/80 mmHg  Pulse 102  Temp(Src) 98.7 F (37.1 C) (Oral)  Ht 4\' 11"  (1.499 m)  Wt 105 lb (47.628 kg)  BMI 21.20 kg/m2  LMP 04/14/2014 (Approximate)  Gen - appears at baseline state (per caregivers) sitting in wheelchair, regular vocalizations (non-verbal) without obvious evidence of distress. Some discomfort on toe exam. HEENT - MMM Ext - Left Great Toe (see picture below): significantly elevated and raised red granulation tissue with raised toenail and no evidence of ingrown nail, seems to have lateral edge of toenail trimmed vs elevation due to granulation tissue. No focal area of fluctuance or focal abscess. Mild surrounding erythema without extension. Significant amount of  dried pustular drainage but unable to express any active drainage today. Otherwise Left Foot nml appearing without edema, and b/l dp pulses intact +2 Skin - warm, dry, no rashes Neuro - awake, baseline mental status poorly alert to surroundings, non-verbal, wheelchair bound          Assessment & Plan:   See specific A&P problem list for details.

## 2014-05-22 NOTE — Telephone Encounter (Signed)
Guardian for the patient called and said that Megan [name]'s right big toe is infected and was wanting some antibiotics called . If you can not call in anything please call them at [phone] to discuss next step. jw

## 2014-05-22 NOTE — Telephone Encounter (Signed)
Left message on voicemail informing that patient will need an appointment prior to any antibiotics being prescribed.

## 2014-05-22 NOTE — Patient Instructions (Signed)
Thank you for bringing Megan [name] into clinic today.  1. It looks like there is a bacterial toenail infection - however her nail does not seem ingrown. 2. Start antibiotic coverage with 2 antibiotics - Bactrim and Cipro - take both twice daily for 10 days. 3. Very important to do regular warm water epsom salt water soaks for 10-46mins at a time about 4 times a day for next week, to help it drain any pus.  Please schedule a follow-up appointment with Dr. Sherril Cong (or any available provider) within 48 hours (before the weekend) to have toe re-checked. May need drainage, otherwise she may need a referral to Podiatry for toenail removal.  If you have any other questions or concerns, please feel free to call the clinic to contact me. You may also schedule an earlier appointment if necessary.  However, if your symptoms get significantly worse, please go to the Emergency Department to seek immediate medical attention.  Megan [name], [place]

## 2014-05-23 NOTE — Assessment & Plan Note (Signed)
Consistent with Left Great Toe nailbed infection, likely due to nailbed trauma or injury from suspected routine grooming / nail trimming. However, atypical presentation without evidence of ingrown toenail (opposite with elevated toenail due to granulation tissue) and no focal abscess amenable to drainage today and without spreading erythema to indicate extension or cellulitis. - Per caregivers, at baseline mental status, afebrile  Plan: 1. No obvious site for I&D today - discussed case with and patient evaluated by preceptor Dr. Elease Hashimoto 2. Start Bactrim-DS BID x 10 days and Cipro 500mg  BID x 10 days for double coverage abx for MRSA / Pseudomonas risk with toe infection, to be crushed and given via G-tube 3. Start warm water / epsom salt soaks regularly up to 4x daily for next few days, continue for up to 1 week to promote drainage and healing 4. RTC 2-3 days for re-check, consider I&D if appropriate, otherwise anticipate toenail may need to be removed if not healing. Recommend urgent referral to Podiatry given entire toenail likely needs to be removed.

## 2014-05-25 ENCOUNTER — Encounter: Payer: Self-pay | Admitting: Family Medicine

## 2014-05-25 ENCOUNTER — Ambulatory Visit (INDEPENDENT_AMBULATORY_CARE_PROVIDER_SITE_OTHER): Payer: Medicaid Other | Admitting: Family Medicine

## 2014-05-25 VITALS — BP 98/62 | HR 80 | Ht 59.0 in | Wt 110.0 lb

## 2014-05-25 DIAGNOSIS — L089 Local infection of the skin and subcutaneous tissue, unspecified: Secondary | ICD-10-CM

## 2014-05-25 MED ORDER — FUROSEMIDE 20 MG PO TABS: 20.0000 mg | ORAL_TABLET | Freq: Every day | ORAL | Status: DC | PRN

## 2014-05-25 NOTE — Assessment & Plan Note (Signed)
Appears to be healing well.  No evidence of purulent drainage of surrounding erythema.  Non tender to touch as well - Recommend continuing soap/water cleansing multiple times per day - Keep Dry gauze dressing on area - Finish ABx course and f/u in 4-5 days to evaluate continued improvement.  May need referral to podiatry at that time for toenail removal if imminent healing not present.

## 2014-05-25 NOTE — Progress Notes (Signed)
   Subjective:    Patient ID: Megan [name], female    DOB: 1980-08-30, 34 y.o.   MRN: [id number]  Patient presents for a same day appointment for f/u of L great toe paronychia. Accompanied by family caregivers who provided history as patient is non-verbal.  HPI  L 1st phalanx lateral paronychia: - PMH CP, severe intellectual disability, non-verbal, wheel-chair bound - Family reported that they have noticed Megan [name] has a Left great toe infection under her toenail that seems to be worsening over past 1.5 weeks. Stated that it initially came to their attention about 1.5 weeks ago after she returned from her school/day program, was told she had some "bleeding" from her Left toenail, unclear initial injury or trauma. Caregivers question if she had nails routinely trimmed recently. Seems to be worsening with some redness, discharge vs pus drainage, and seems to be significantly causing her pain / distress. - No history of prior ingrown toenail or similar infections - Denies any fevers, spreading redness or swelling to other toes or foot, recent illness, nausea / vomiting  - Seen 3 days ago and started on Bactrim and Cipro for MRSA and Pseudomonas coverage.  Has been using warm water soaks.    I have reviewed and updated the following as appropriate: allergies and current medications  Social Hx: - Never smoker  Review of Systems  See above HPI    Objective:   Physical Exam  BP 98/62 mmHg  Pulse 80  Temp(Src)   Ht 4\' 11"  (1.499 m)  Wt 110 lb (49.896 kg)  BMI 22.21 kg/m2  LMP 04/14/2014 (Approximate)  Gen - appears at baseline state (per caregivers) sitting in wheelchair, regular vocalizations (non-verbal) without obvious evidence of distress. Some discomfort on toe exam. HEENT - MMM Ext - Left 1st phalanx: significantly elevated and raised red granulation tissue with raised toenail and no evidence of ingrown nail, seems to have lateral edge of toenail trimmed vs elevation due to  granulation tissue. No focal area of fluctuance or focal abscess. No surrounding erythema Otherwise Left Foot nml appearing without edema, and b/l dp pulses intact +2 Neuro - awake, baseline mental status poorly alert to surroundings, non-verbal, wheelchair bound

## 2014-05-25 NOTE — Patient Instructions (Signed)
Please come back in 5 days to re-evaluate the area.  Continue with the antibiotics and warm water soaks/cleanings.  Thanks, Dr. Awanda Mink

## 2014-06-04 ENCOUNTER — Ambulatory Visit: Payer: Medicaid Other | Admitting: Family Medicine

## 2014-06-06 ENCOUNTER — Ambulatory Visit (INDEPENDENT_AMBULATORY_CARE_PROVIDER_SITE_OTHER): Payer: Medicaid Other | Admitting: Family Medicine

## 2014-06-06 ENCOUNTER — Encounter: Payer: Self-pay | Admitting: Family Medicine

## 2014-06-06 VITALS — BP 111/44 | Temp 98.7°F | Wt 110.0 lb

## 2014-06-06 DIAGNOSIS — L089 Local infection of the skin and subcutaneous tissue, unspecified: Secondary | ICD-10-CM

## 2014-06-06 NOTE — Assessment & Plan Note (Signed)
Healing well, no S/Sx of infection.  Finished ABx - Referral to podiatry to remove nail due to complexity of MR/CP and anesthesia as issue - F/U PRN

## 2014-06-06 NOTE — Progress Notes (Signed)
   Subjective:    Patient ID: Megan [name], female    DOB: 02/04/1981, 34 y.o.   MRN: [id number]  Patient presents for a same day appointment for f/u of L great toe paronychia. Accompanied by family caregivers who provided history as patient is non-verbal.  HPI  L 1st phalanx lateral paronychia: - PMH CP, severe intellectual disability, non-verbal, wheel-chair bound - Family reported that they have noticed Megan [name] has a Left great toe infection under her toenail that seems to be worsening over past 1.5 weeks. Stated that it initially came to their attention about 1.5 weeks ago after she returned from her school/day program, was told she had some "bleeding" from her Left toenail, unclear initial injury or trauma. Caregivers question if she had nails routinely trimmed recently. Seems to be worsening with some redness, discharge vs pus drainage, and seems to be significantly causing her pain / distress. - No history of prior ingrown toenail or similar infections  - Seen 2 weeks ago and started on Bactrim and Cipro for MRSA and Pseudomonas coverage.  Has been using warm water soaks.  Since that point, doing well, acting normally, denies fever, chills, or sweats.  Tissue surrounding the area well.    I have reviewed and updated the following as appropriate: allergies and current medications  Social Hx: - Never smoker  Review of Systems  See above HPI    Objective:   Physical Exam  BP 111/44 mmHg  Temp(Src) 98.7 F (37.1 C) (Axillary)  LMP 04/14/2014 (Approximate)  Gen - appears at baseline state (per caregivers) sitting in wheelchair  Ext - Left 1st phalanx: significantly elevated and raised red granulation tissue with raised toenail and no evidence of ingrown nail. No focal area of fluctuance or focal abscess. No surrounding erythema. Otherwise Left Foot nml appearing without edema, and b/l dp pulses intact +2 Neuro - awake, baseline mental status poorly alert to surroundings,  non-verbal, wheelchair bound

## 2014-06-13 ENCOUNTER — Ambulatory Visit: Payer: Medicaid Other | Admitting: Podiatrist

## 2014-06-25 ENCOUNTER — Encounter: Payer: Self-pay | Admitting: Podiatry

## 2014-06-25 ENCOUNTER — Ambulatory Visit (INDEPENDENT_AMBULATORY_CARE_PROVIDER_SITE_OTHER): Payer: Medicaid Other | Admitting: Podiatry

## 2014-06-25 VITALS — BP 117/76 | HR 95 | Ht 59.0 in | Wt 110.0 lb

## 2014-06-25 DIAGNOSIS — L600 Ingrowing nail: Secondary | ICD-10-CM

## 2014-06-25 DIAGNOSIS — L608 Other nail disorders: Secondary | ICD-10-CM

## 2014-06-25 NOTE — Progress Notes (Signed)
   Subjective:    Patient ID: Megan [name], female    DOB: 31-Jul-1980, 34 y.o.   MRN: [id number]  HPI Comments: N nail problem L left 1st toenail D 1 month O hx of redness, and swelling C current peeling of the left 1st toenail A diabetic pt and immobility T referred by [facility]   patient has completed 10 days of Septra DS twice a day and Cipro 500 mg twice a day prescribed on 05/23/2014    Review of Systems  All other systems reviewed and are negative.      Objective:   Physical Exam  Non-orientated 3 patient presents with her cousin and grandmother  Vascular: DP pulses 2/4 bilaterally PT pulses 2/4 bilaterally  Neurological: Weekly reactive ankle reflexes bilaterally  Dermatological: Left hallux is sloughing without any erythema and edema. After debridement the underlying nailbed demonstrates no erythema, warmth or drainage.  Musculoskeletal: The left hallux is overlapping Patient's lower extremity externally rotated with plantarflexed ankles bilaterally Limited motion ankle subtalar midtarsal joints bilaterally      Assessment & Plan:   Assessment: Resolved paronychia left hallux Dystrophic left hallux nail  Plan: Debridement left hallux nail without a bleeding Recommend application of antibiotic ointment and/or Vaseline to left hallux nail beds daily, cover with Band-Aid until nail regrowth Also discussed with grandmother the possibility of cutting out old shoe over left hallux nail  Reappoint when necessary

## 2014-06-25 NOTE — Patient Instructions (Signed)
Apply topical antibiotic ointment or Vaseline to the left big toenail daily, cover with Band-Aid until nailbed looks normal

## 2014-07-02 ENCOUNTER — Other Ambulatory Visit: Payer: Self-pay | Admitting: Family Medicine

## 2014-07-02 DIAGNOSIS — E039 Hypothyroidism, unspecified: Secondary | ICD-10-CM

## 2014-07-10 ENCOUNTER — Other Ambulatory Visit: Payer: Self-pay | Admitting: Family Medicine

## 2014-07-10 DIAGNOSIS — K219 Gastro-esophageal reflux disease without esophagitis: Secondary | ICD-10-CM

## 2014-07-25 ENCOUNTER — Other Ambulatory Visit: Payer: Self-pay | Admitting: Family Medicine

## 2014-07-30 ENCOUNTER — Telehealth: Payer: Self-pay

## 2014-07-30 DIAGNOSIS — G40319 Generalized idiopathic epilepsy and epileptic syndromes, intractable, without status epilepticus: Secondary | ICD-10-CM

## 2014-07-30 DIAGNOSIS — G40219 Localization-related (focal) (partial) symptomatic epilepsy and epileptic syndromes with complex partial seizures, intractable, without status epilepticus: Secondary | ICD-10-CM

## 2014-07-30 MED ORDER — VIMPAT 10 MG/ML PO SOLN: ORAL | Status: DC

## 2014-07-30 NOTE — Telephone Encounter (Signed)
Done and signed 

## 2014-07-30 NOTE — Telephone Encounter (Signed)
Called and let Megan [name] know the Rx was faxed to pharmacy.

## 2014-07-30 NOTE — Telephone Encounter (Signed)
Megan [name], family member, called stating that Megan [name] is out of her Vimpat 10mg /mL sol sig: Give 5 mLs q am and 5 mLs q pm. She said that Dr. Lacinda Axon prescribed this medication when pt was in the hospital. She is unable to get in touch with Dr. Lacinda Axon for the refill. She wants to know if our provider can authorize a refill for the medication? Megan [name] has a f/u scheduled with Dr.H on 08-30-14. I told Megan [name] that would speak to Dr.H and call her back to let her know of the out come. Her phone number is: [phone].  Dr.H, I see where the refill request was sent to the provider on 07-25-14, however, it was not authorized.

## 2014-08-30 ENCOUNTER — Encounter: Payer: Self-pay | Admitting: Pediatrics

## 2014-08-30 ENCOUNTER — Ambulatory Visit (INDEPENDENT_AMBULATORY_CARE_PROVIDER_SITE_OTHER): Payer: Medicaid Other | Admitting: Pediatrics

## 2014-08-30 VITALS — BP 94/64 | HR 96

## 2014-08-30 DIAGNOSIS — G40219 Localization-related (focal) (partial) symptomatic epilepsy and epileptic syndromes with complex partial seizures, intractable, without status epilepticus: Secondary | ICD-10-CM | POA: Diagnosis not present

## 2014-08-30 DIAGNOSIS — G40319 Generalized idiopathic epilepsy and epileptic syndromes, intractable, without status epilepticus: Secondary | ICD-10-CM

## 2014-08-30 DIAGNOSIS — G40209 Localization-related (focal) (partial) symptomatic epilepsy and epileptic syndromes with complex partial seizures, not intractable, without status epilepticus: Secondary | ICD-10-CM | POA: Diagnosis not present

## 2014-08-30 DIAGNOSIS — G40311 Generalized idiopathic epilepsy and epileptic syndromes, intractable, with status epilepticus: Secondary | ICD-10-CM

## 2014-08-30 DIAGNOSIS — H47619 Cortical blindness, unspecified side of brain: Secondary | ICD-10-CM

## 2014-08-30 DIAGNOSIS — G40309 Generalized idiopathic epilepsy and epileptic syndromes, not intractable, without status epilepticus: Secondary | ICD-10-CM | POA: Diagnosis not present

## 2014-08-30 DIAGNOSIS — G8389 Other specified paralytic syndromes: Secondary | ICD-10-CM | POA: Diagnosis not present

## 2014-08-30 DIAGNOSIS — R569 Unspecified convulsions: Secondary | ICD-10-CM | POA: Diagnosis not present

## 2014-08-30 DIAGNOSIS — G8250 Quadriplegia, unspecified: Secondary | ICD-10-CM

## 2014-08-30 DIAGNOSIS — F72 Severe intellectual disabilities: Secondary | ICD-10-CM

## 2014-08-30 MED ORDER — LEVETIRACETAM 100 MG/ML PO SOLN: ORAL | Status: DC

## 2014-08-30 MED ORDER — VALPROIC ACID 250 MG/5ML PO SYRP: ORAL_SOLUTION | ORAL | Status: DC

## 2014-08-30 MED ORDER — VIMPAT 10 MG/ML PO SOLN: ORAL | Status: DC

## 2014-08-30 NOTE — Progress Notes (Signed)
Patient: Megan [name] MRN: [id number] Sex: female DOB: Jul 07, 1980  Provider: Jodi Geralds, MD Location of Care: [facility]  Note type: Routine return visit  History of Present Illness: Referral Source: [facility] History from: referring office Chief Complaint: seizures  Megan [name] is a 34 y.o. female who returns on Aug 30, 2014 for the first time since February 12, 2014.  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  See past medical history for reports of her EEGs.    In the interim since she was seen, she has done well.  There have been no seizures.  She has some difficulty falling asleep and then may sleep in school.  She attends [employer] between 8 a.m. and 2:30 p.m.  She is taken and returns home by bus.  Her general health has been good.  Her mother did not have any other concerns today.  She is wheelchair-bound and has flexion contractures of the knees, making it impossible for her to walk.  Review of Systems: 12 system review was unremarkable, no intercurrent infections, no new neurologic or medical complaints  Past Medical History Diagnosis Date  . Cerebral palsy, quadriplegic   . Cortical blindness   . Seizures   . Mental retardation   . Cerebral palsy   . Hypertension 06/23/2010  . Vision abnormalities   . Allergic rhinitis 11/04/2012  . CHF (congestive heart failure)   . COPD (chronic obstructive pulmonary disease)   . Pneumonia December 2014    Was intubated, [facility]  . Dilantin toxicity August 2014   Hospitalizations: No., Head Injury: No., Nervous System Infections: No., Immunizations up to date: Yes.     HEAD CT WITHOUT CONTRAST: May 03, 2007 The cerebral and cerebellar hemispheres are normal in attenuation and morphology.  The midline is  maintained. There is no edema or mass effect.  There is no abnormal extra-axial fluid collection, intracranial hemorrhage, or mass noted.  The mastoid air cells are normal aerated.  There fluid level identified within the sphenoid sinus.  IMPRESSION:  1. No acute intracranial abnormalities.  2. Sphenoid sinus inflammation.   August 23, 2013  Abnormal EEG on the basis of low-voltage slowing.  Hospitalized January 11, 2014 for 2 days due to seizure activity.  January 13, 2014 JHE:RDEY is a abnormal record with the patient awake, drowsy and asleep. Background activity shows upper alpha lower beta range activity that does not change with eye opening and superimposed slow-wave activity that is characteristic of static encephalopathy. The patient has interictal activity that consists of generalized and frontally predominant sharp waves and spike and slow-waves that are epileptogenic from an electrographic viewpoint . However no clinical seizures with electrographic correlates were seen over a 21 hour study.  Behavior History none  Surgical History Procedure Laterality Date  . Peg tube placement    . Peg placement  09/03/2011    Procedure: PERCUTANEOUS ENDOSCOPIC GASTROSTOMY (PEG) REPLACEMENT;  Surgeon: Jerene Bears, MD;  Location: [facility];  Service: Gastroenterology;  Laterality: N/A;  . Peg placement  02/19/2012    Procedure: PERCUTANEOUS ENDOSCOPIC GASTROSTOMY (PEG) REPLACEMENT;  Surgeon: Lafayette Dragon, MD;  Location: [facility];  Service: Endoscopy;  Laterality: N/A;  needs gastrostomy button 24 Fr 4.4 and 3.5 available  . Peg placement N/A 05/30/2013    Procedure: PERCUTANEOUS ENDOSCOPIC GASTROSTOMY (PEG) REPLACEMENT;  Surgeon: Jerene Bears,  MD;  Location: [place];  Service: Gastroenterology;  Laterality: N/A;  . Peg placement N/A 07/03/2013    Procedure: PERCUTANEOUS ENDOSCOPIC GASTROSTOMY (PEG) REPLACEMENT;  Surgeon: Lafayette Dragon, MD;  Location: [facility];   Service: Endoscopy;  Laterality: N/A;  Replace with 20 Fr x 3.5 cm mini button-   Family History family history includes Cancer in her maternal grandfather; Diabetes in her maternal aunt, maternal grandmother, and mother; Hypertension in her other; Stroke in her maternal grandmother. Family history is negative for migraines, seizures, intellectual disabilities, blindness, deafness, birth defects, chromosomal disorder, or autism.  Social History . Marital Status: Single    Spouse Name: N/A  . Number of Children: N/A  . Years of Education: N/A   Social History Main Topics  . Smoking status: Passive Smoke Exposure - Never Smoker  . Smokeless tobacco: Never Used  . Alcohol Use: No  . Drug Use: No  . Sexual Activity: No   Social History Narrative   Educational level grautated School Attending: [employer]  Occupation: Student  Living with grandmother  Hobbies/Interest: [place] enjoys watching TV, playing music and going school.  School comments [place] is doing good in school.  Allergies Allergen Reactions  . Doxycycline Rash    REACTION: Blisters / swelling   Physical Exam BP 94/64 mmHg  Pulse 96   General: Well developed young woman, seated in wheelchair  Head: Normocephalic and atraumatic  Ears, Nose and Throat: Tympanic membranes partially occluded by cerumen bilaterally. Unable to fully examine her posterior pharynx due to patient's inability to cooperate. Her tongue protrudes.  Neck: supple, unable to maintain head control  Respiratory: lungs clear to auscultation  Cardiovascular: regular rate and rhythm. No murmurs  Musculoskeletal: contractures of extremities at the knees and elbows. she has hypotonia in her trunk  Skin: intact, no rashes or lesions  Trunk: PEG tube intact  Neurologic Exam  Mental Status: Awake. She smiles at times when her family interacts with her. Evidence of profound intellectual delay. She has no speech.  Cranial Nerves: Fundoscopic  exam reveals poorly visualized disc margins. Pupils equal, briskly reactive to light. Unable to evaluate EOM's due to her inability to follow instructions. She has dysconjugate eye movements. Does not fix and follow on objects in her visual field. She turns to localize sounds but does not do so consistently. She has lower facial weakness with drooling. Her tongue protrudes at all times. Unable to adequately evaluate neck flexion and extension.  Motor: She has spastic quadriplegia  Sensory: Withdraws to noxious stimuli  Coordination: Cannot cooperate with exam  Gait and Station: wheelchair bound, does not ambulate  Reflexes: diminished throughout   Assessment 1. 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 2. 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 3. 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 4. 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 5. 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  Discussion I am pleased that we were able to keep the patient's seizures under control.    Plan Prescriptions were refilled for levetiracetam, Vimpat, and valproic acid.  I spent 30 minutes of face-to-face time with the patient and her mother more than half of it consultation.   Medication List   This list is accurate as of: 08/30/14 11:59 PM.       AMBULATORY NON FORMULARY MEDICATION  Abdominal binder     diazepam 10 MG Gel  Commonly known as:  DIASTAT ACUDIAL  Place 5 mg rectally once.     furosemide 20 MG tablet  Commonly known as:  LASIX  Take 1 tablet (20 mg total) by mouth daily as needed for fluid.     guaiFENesin 200 MG tablet  Take 1 tablet (200 mg total) by mouth every 4 (four) hours as needed for cough or to loosen phlegm.     JEVITY Liqd  1 Can by Per J Tube route 3 (three) times daily.     levETIRAcetam 100 MG/ML solution  Commonly known as:  KEPPRA  Give 12.5 mls in the morning and 12.5  mls in the evening by peg tube     levothyroxine 88 MCG tablet  Commonly known as:  SYNTHROID, LEVOTHROID  TAKE 1 TABLET (88 MCG TOTAL) BY MOUTH DAILY.     loratadine 5 MG/5ML syrup  Commonly known as:  CLARITIN  Take 10 mLs (10 mg total) by mouth daily.     ranitidine 75 MG/5ML syrup  Commonly known as:  ZANTAC  5 MLS (75 MG TOTAL) BY PER J TUBE ROUTE 2 (TWO) TIMES DAILY.     trimethoprim-polymyxin b ophthalmic solution  Commonly known as:  POLYTRIM  Place 2 drops into the left eye every 4 (four) hours.     Valproic Acid 250 MG/5ML Syrp syrup  Commonly known as:  DEPAKENE  GIVE 13 ML BY PEG TUBE EVERY 6 HOURS     VIMPAT 10 MG/ML Soln  Generic drug:  Lacosamide  Give 56ml in the morning and 64ml in the evening      The medication list was reviewed and reconciled. All changes or newly prescribed medications were explained.  A complete medication list was provided to the patient/caregiver.  Jodi Geralds MD

## 2014-09-13 ENCOUNTER — Telehealth: Payer: Self-pay | Admitting: Family Medicine

## 2014-10-12 ENCOUNTER — Encounter: Payer: Self-pay | Admitting: Family Medicine

## 2014-10-12 ENCOUNTER — Ambulatory Visit (INDEPENDENT_AMBULATORY_CARE_PROVIDER_SITE_OTHER): Payer: Medicaid Other | Admitting: Family Medicine

## 2014-10-12 VITALS — BP 91/62 | HR 96 | Temp 97.8°F | Resp 18 | Wt 110.0 lb

## 2014-10-12 DIAGNOSIS — G809 Cerebral palsy, unspecified: Secondary | ICD-10-CM

## 2014-10-12 DIAGNOSIS — Z0000 Encounter for general adult medical examination without abnormal findings: Secondary | ICD-10-CM

## 2014-10-12 DIAGNOSIS — J302 Other seasonal allergic rhinitis: Secondary | ICD-10-CM | POA: Diagnosis not present

## 2014-10-12 MED ORDER — LORATADINE 5 MG/5ML PO SYRP: 10.0000 mg | ORAL_SOLUTION | Freq: Every day | ORAL | Status: DC

## 2014-10-12 NOTE — Patient Instructions (Signed)
I am refilling Megan [name]'s jevity and claritin. I will have the forms and letter ready next week. We will call to let you know when they are ready to pick up.

## 2014-10-13 ENCOUNTER — Other Ambulatory Visit: Payer: Self-pay | Admitting: Pediatrics

## 2014-10-15 NOTE — Progress Notes (Signed)
   Subjective:    Patient ID: Megan [name], female    DOB: 04-14-1981, 34 y.o.   MRN: [id number]  HPI Pt presents for annual exam and completion of adult daycare forms. Patient has been doing well other than some increased cough/congestion recently. Patient has been off claritin and caregiver believes this is why.   Her chronic medical problems and treatments are stable.   Review of Systems  Constitutional: Negative for fever and diaphoresis.  HENT: Positive for congestion.   Respiratory: Positive for cough. Negative for shortness of breath.   Gastrointestinal: Negative for vomiting and diarrhea.  Skin: Negative for rash.  All other systems reviewed and are negative.      Objective:   Physical Exam  Constitutional: She appears well-nourished. No distress.  Chronically ill-appear spastic quadriplegic  HENT:  Head: Normocephalic and atraumatic.  Eyes: Conjunctivae are normal. Pupils are equal, round, and reactive to light. Right eye exhibits no discharge. Left eye exhibits no discharge.  Cardiovascular: Normal rate, regular rhythm, normal heart sounds and intact distal pulses.   No murmur heard. Pulmonary/Chest: Effort normal. No respiratory distress. She has no wheezes.  Coarse upper airway sounds  Abdominal: Soft. Bowel sounds are normal. She exhibits no distension. There is no tenderness.  g-tube site well healed   Musculoskeletal: She exhibits no edema.  Severe rigidity of all extremities and torso  Neurological:  Not oriented, nonverbal, no interaction/response  Skin: Skin is warm and dry. No rash noted. She is not diaphoretic.  Nursing note and vitals reviewed.         Assessment & Plan:

## 2014-10-15 NOTE — Assessment & Plan Note (Signed)
Management per neuro, stable - filled out forms for [employer]

## 2014-10-23 ENCOUNTER — Other Ambulatory Visit: Payer: Self-pay | Admitting: Family

## 2014-10-24 ENCOUNTER — Telehealth: Payer: Self-pay | Admitting: Family Medicine

## 2015-01-07 ENCOUNTER — Ambulatory Visit (INDEPENDENT_AMBULATORY_CARE_PROVIDER_SITE_OTHER): Payer: Medicaid Other | Admitting: Internal Medicine

## 2015-01-07 ENCOUNTER — Encounter: Payer: Self-pay | Admitting: Internal Medicine

## 2015-01-07 VITALS — BP 106/64 | HR 99

## 2015-01-07 DIAGNOSIS — B350 Tinea barbae and tinea capitis: Secondary | ICD-10-CM

## 2015-01-07 LAB — CBC WITH DIFFERENTIAL/PLATELET
Basophils Absolute: 0 10*3/uL (ref 0.0–0.1)
Basophils Relative: 0 % (ref 0–1)
Eosinophils Absolute: 0.1 10*3/uL (ref 0.0–0.7)
Eosinophils Relative: 1 % (ref 0–5)
HEMATOCRIT: 32.8 % — AB (ref 36.0–46.0)
Hemoglobin: 11.1 g/dL — ABNORMAL LOW (ref 12.0–15.0)
LYMPHS ABS: 2.9 10*3/uL (ref 0.7–4.0)
LYMPHS PCT: 48 % — AB (ref 12–46)
MCH: 35.1 pg — AB (ref 26.0–34.0)
MCHC: 33.8 g/dL (ref 30.0–36.0)
MCV: 103.8 fL — ABNORMAL HIGH (ref 78.0–100.0)
MONOS PCT: 16 % — AB (ref 3–12)
MPV: 10.7 fL (ref 8.6–12.4)
Monocytes Absolute: 1 10*3/uL (ref 0.1–1.0)
NEUTROS PCT: 35 % — AB (ref 43–77)
Neutro Abs: 2.1 10*3/uL (ref 1.7–7.7)
Platelets: 176 10*3/uL (ref 150–400)
RBC: 3.16 MIL/uL — ABNORMAL LOW (ref 3.87–5.11)
RDW: 16.8 % — ABNORMAL HIGH (ref 11.5–15.5)
WBC: 6.1 10*3/uL (ref 4.0–10.5)

## 2015-01-07 MED ORDER — GRISEOFULVIN MICROSIZE 500 MG PO TABS: 500.0000 mg | ORAL_TABLET | Freq: Every day | ORAL | Status: DC

## 2015-01-07 NOTE — Assessment & Plan Note (Signed)
-   Prescribed Griseofulvin 500mg  daily for 4 weeks.  - CBC, CMP ordered today to determine baseline before starting antifungal

## 2015-01-07 NOTE — Patient Instructions (Signed)
Thank you for coming in today.   It looks like Megan [name] has a fungal infection. Please take Griseofulvin 500mg  daily for four weeks.  Please seek medical care if symptoms worsen or if she gets fevers.   I also ordered a few labs for today.

## 2015-01-07 NOTE — Progress Notes (Signed)
Patient ID: Fatemah Pourciau, female   DOB: 10-01-1980, 34 y.o.   MRN: [id number]  Subjective:   CC: boils on back of head  HPI:   Patient is brought by her grandmother and cousin. Grandmother notes of boil/blister that popped up on patient's scalp about a week ago. Initially only there was only one; then she started to have multiple. Grandmother notes they get bigger and burst; denies drainage from site. Has been using neosporin and tea tree oil with minimal improvement. Denies history of skin infections other than ringworm on patient's arm.  Denies fevers, but notes of increased warmth in the area.    Review of Systems - Per HPI.  PMH, FH, or SH reviewed Smoking status: never smoker    Objective:  Physical Exam BP 106/64 mmHg  Pulse 99 GEN: NAD, spastic qadriplegic Head: multiple (~ 4 ) areas of induration with diameter of ~2-2.5 located in the occipital scalp. One area of induration has 3 pustules at the center of induration. Other areas of induration have small central shallow opened wounds with minimal clear drainage. No significant erythema, no fluctuance, no increased warmth. Positive fluorescence with woods light.  Neck: no lymphadenopathy Cardiovascular: RRR, no murmurs rubs or gallops Lungs: CTAB with some upper airway sounds     Assessment:     Lorielle Boehning is a 34 y.o. female here for boils noted on scalp.    Plan:     Tinea capitis - Prescribed Griseofulvin 500mg  daily for 4 weeks.  - CBC, CMP ordered today to determine baseline before starting antifungal   Follow-up: As needed  Smiley Houseman, MD [place]  PGY 1

## 2015-01-08 ENCOUNTER — Telehealth: Payer: Self-pay | Admitting: *Deleted

## 2015-01-08 LAB — COMPREHENSIVE METABOLIC PANEL
ALT: 10 U/L (ref 6–29)
AST: 22 U/L (ref 10–30)
Albumin: 3.2 g/dL — ABNORMAL LOW (ref 3.6–5.1)
Alkaline Phosphatase: 101 U/L (ref 33–115)
BILIRUBIN TOTAL: 0.3 mg/dL (ref 0.2–1.2)
BUN: 17 mg/dL (ref 7–25)
CALCIUM: 9.3 mg/dL (ref 8.6–10.2)
CHLORIDE: 100 mmol/L (ref 98–110)
CO2: 30 mmol/L (ref 20–31)
Creat: 0.97 mg/dL (ref 0.50–1.10)
Glucose, Bld: 98 mg/dL (ref 65–99)
Potassium: 5.1 mmol/L (ref 3.5–5.3)
Sodium: 141 mmol/L (ref 135–146)
Total Protein: 8.1 g/dL (ref 6.1–8.1)

## 2015-01-08 NOTE — Telephone Encounter (Signed)
Prior Authorization received from [facility] pharmacy for griseofulvin micro 500 mg. Formulary and PA form placed in provider box for completion. Derl Barrow, RN

## 2015-01-14 ENCOUNTER — Other Ambulatory Visit: Payer: Self-pay | Admitting: Internal Medicine

## 2015-01-14 DIAGNOSIS — B350 Tinea barbae and tinea capitis: Secondary | ICD-10-CM

## 2015-01-14 NOTE — Telephone Encounter (Signed)
Medication was changed to Gris-Peg 500 mg PO, #28; 1 tablet by mouth daily changed by Dr. Dallas Schimke.  Derl Barrow, RN

## 2015-01-24 ENCOUNTER — Encounter: Payer: Self-pay | Admitting: Family Medicine

## 2015-01-24 ENCOUNTER — Ambulatory Visit (INDEPENDENT_AMBULATORY_CARE_PROVIDER_SITE_OTHER): Payer: Medicaid Other | Admitting: Family Medicine

## 2015-01-24 VITALS — BP 111/76 | HR 106 | Temp 97.4°F

## 2015-01-24 DIAGNOSIS — B350 Tinea barbae and tinea capitis: Secondary | ICD-10-CM | POA: Diagnosis not present

## 2015-01-24 DIAGNOSIS — Z23 Encounter for immunization: Secondary | ICD-10-CM | POA: Diagnosis not present

## 2015-01-24 DIAGNOSIS — G809 Cerebral palsy, unspecified: Secondary | ICD-10-CM | POA: Diagnosis not present

## 2015-01-24 MED ORDER — JEVITY PO LIQD: 1.0000 | Freq: Three times a day (TID) | ORAL | Status: DC

## 2015-01-24 NOTE — Patient Instructions (Signed)
Ringworm of the Scalp Tinea Capitis is also called scalp ringworm. It is a fungal infection of the skin on the scalp seen mainly in children.  CAUSES  Scalp ringworm spreads from:  Other people.  Pets (cats and dogs) and animals.  Bedding, hats, combs or brushes shared with an infected person  Theater seats that an infected person sat in. SYMPTOMS  Scalp ringworm causes the following symptoms:  Flaky scales that look like dandruff.  Circles of thick, raised red skin.  Hair loss.  Red pimples or pustules.  Swollen glands in the back of the neck.  Itching. DIAGNOSIS  A skin scraping or infected hairs will be sent to test for fungus. Testing can be done either by looking under the microscope (KOH examination) or by doing a culture (test to try to grow the fungus). A culture can take up to 2 weeks to come back. TREATMENT   Scalp ringworm must be treated with medicine by mouth to kill the fungus for 6 to 8 weeks.  Medicated shampoos (ketoconazole or selenium sulfide/selsun blue shampoo) may be used to decrease the shedding of fungal spores from the scalp.  It is important that any family members or pets that have the fungus be treated. HOME CARE INSTRUCTIONS   Be sure to treat the rash completely - follow your caregiver's instructions. It can take a month or more to treat. If you do not treat it long enough, the rash can come back.  Watch for other cases in your family or pets.  Do not share brushes, combs, barrettes, or hats. Do not share towels.  Combs, brushes, and hats should be cleaned carefully and natural bristle brushes must be thrown away.  It is not necessary to shave the scalp or wear a hat during treatment.  Children may attend school once they start treatment with the oral medicine.  Be sure to follow up with your caregiver as directed to be sure the infection is gone. SEEK MEDICAL CARE IF:   Rash is worse.  Rash is spreading.  Rash returns after  treatment is completed.  The rash is not better in 2 weeks with treatment. Fungal infections are slow to respond to treatment. Some redness may remain for several weeks after the fungus is gone. SEEK IMMEDIATE MEDICAL CARE IF:  The area becomes red, warm, tender, and swollen.  Pus is oozing from the rash.  You or your child has an oral temperature above 102 F (38.9 C), not controlled by medicine. Document Released: 04/10/2000 Document Revised: 07/06/2011 Document Reviewed: 05/23/2008 [facility] Patient Information 2015 [place], [place]. This information is not intended to replace advice given to you by your health care provider. Make sure you discuss any questions you have with your health care provider.

## 2015-01-31 ENCOUNTER — Telehealth: Payer: Self-pay | Admitting: Family Medicine

## 2015-01-31 NOTE — Assessment & Plan Note (Signed)
Appears to be improving slowly, no evidence of bacterial superinfection, recommend selsun blue in addition to oral treatment - continue griseofulvin x4 weeks, may need to extend course - f/u if not improving in 1-2 weeks

## 2015-01-31 NOTE — Progress Notes (Signed)
   Subjective:   Megan [name] is a 34 y.o. female with a history of MR/CP here for f/u scalp rash  Caregivers reports that Megan [name]'s rash worsened some a few days ago causing them to make the appointment. They have been treating with griseofulvin since 9/19, no problems with med. They report no changes in patient's behavior, no fevers or signs of her being in pain. One spot on the back of her head did have an open area that has since healed. They were worried that she might have a boil under the surface and wondered what else they could do to help it clear up faster.  Review of Systems:  Per HPI. All other systems reviewed and are negative.   PMH, PSH, Medications, Allergies, and FmHx reviewed and updated in EMR.  Social History: never smoker  Objective:  BP 111/76 mmHg  Pulse 106  Temp(Src) 97.4 F (36.3 C) (Oral)  Wt   Gen:  34 y.o. female in NAD HEENT: NCAT, MMM, EOMI, PERRL, anicteric sclerae CV: RRR, no MRG Resp: Non-labored, CTAB, no wheezes noted Abd: Soft, NTND, BS present, no guarding or organomegaly Ext: WWP, no edema MSK: Spastic quadriplegia Neuro: Alert, nonverbal      Chemistry      Component Value Date/Time   NA 141 01/07/2015 1603   K 5.1 01/07/2015 1603   CL 100 01/07/2015 1603   CO2 30 01/07/2015 1603   BUN 17 01/07/2015 1603   CREATININE 0.97 01/07/2015 1603   CREATININE 0.55 01/12/2014 0650      Component Value Date/Time   CALCIUM 9.3 01/07/2015 1603   ALKPHOS 101 01/07/2015 1603   AST 22 01/07/2015 1603   ALT 10 01/07/2015 1603   BILITOT 0.3 01/07/2015 1603      Lab Results  Component Value Date   WBC 6.1 01/07/2015   HGB 11.1* 01/07/2015   HCT 32.8* 01/07/2015   MCV 103.8* 01/07/2015   PLT 176 01/07/2015   Lab Results  Component Value Date   TSH 0.488 01/11/2014   Lab Results  Component Value Date   HGBA1C * 05/02/2007    4.5 (NOTE)   The ADA recommends the following therapeutic goals for glycemic   control related to Hgb A1C  measurement:   Goal of Therapy:   < 7.0% Hgb A1C   Action Suggested:  > 8.0% Hgb A1C   Ref:  Diabetes Care, 22, Suppl. 1, 1999   Assessment & Plan:     Megan [name] is a 34 y.o. female here for tinea capitis  Tinea capitis Appears to be improving slowly, no evidence of bacterial superinfection, recommend selsun blue in addition to oral treatment - continue griseofulvin x4 weeks, may need to extend course - f/u if not improving in 1-2 weeks    Beverlyn Roux, MD, MPH [facility] PGY-3 01/31/2015 10:10 AM

## 2015-01-31 NOTE — Telephone Encounter (Signed)
Calling regarding recent order sent for patient's tube feeding .  Please call back asap to confirm which jebity to use for patient.  And method she's to be fed.  Pump or Bolus.

## 2015-02-09 ENCOUNTER — Other Ambulatory Visit: Payer: Self-pay | Admitting: Internal Medicine

## 2015-02-09 DIAGNOSIS — B350 Tinea barbae and tinea capitis: Secondary | ICD-10-CM

## 2015-02-18 ENCOUNTER — Emergency Department (HOSPITAL_COMMUNITY)
Admission: EM | Admit: 2015-02-18 | Discharge: 2015-02-18 | Disposition: A | Discharge status: Home / Self Care | Payer: Medicaid Other | Attending: Emergency Medicine | Admitting: Emergency Medicine

## 2015-02-18 ENCOUNTER — Encounter (HOSPITAL_COMMUNITY): Payer: Self-pay | Admitting: Emergency Medicine

## 2015-02-18 ENCOUNTER — Emergency Department (HOSPITAL_COMMUNITY): Payer: Medicaid Other

## 2015-02-18 DIAGNOSIS — K9423 Gastrostomy malfunction: Secondary | ICD-10-CM | POA: Diagnosis present

## 2015-02-18 DIAGNOSIS — T85598A Other mechanical complication of other gastrointestinal prosthetic devices, implants and grafts, initial encounter: Secondary | ICD-10-CM

## 2015-02-18 DIAGNOSIS — I10 Essential (primary) hypertension: Secondary | ICD-10-CM | POA: Diagnosis not present

## 2015-02-18 DIAGNOSIS — Z79899 Other long term (current) drug therapy: Secondary | ICD-10-CM | POA: Diagnosis not present

## 2015-02-18 DIAGNOSIS — I509 Heart failure, unspecified: Secondary | ICD-10-CM | POA: Insufficient documentation

## 2015-02-18 DIAGNOSIS — J449 Chronic obstructive pulmonary disease, unspecified: Secondary | ICD-10-CM | POA: Insufficient documentation

## 2015-02-18 DIAGNOSIS — R633 Feeding difficulties, unspecified: Secondary | ICD-10-CM

## 2015-02-18 DIAGNOSIS — G40909 Epilepsy, unspecified, not intractable, without status epilepticus: Secondary | ICD-10-CM | POA: Insufficient documentation

## 2015-02-18 DIAGNOSIS — Z8701 Personal history of pneumonia (recurrent): Secondary | ICD-10-CM | POA: Insufficient documentation

## 2015-02-18 NOTE — ED Notes (Signed)
Per family states feeding tube just fell out-states it was old

## 2015-02-22 ENCOUNTER — Inpatient Hospital Stay (HOSPITAL_COMMUNITY): Admit: 2015-02-22 | Payer: Medicaid Other

## 2015-02-22 ENCOUNTER — Other Ambulatory Visit (HOSPITAL_COMMUNITY): Payer: Medicaid Other

## 2015-02-22 ENCOUNTER — Other Ambulatory Visit (HOSPITAL_COMMUNITY): Payer: Self-pay | Admitting: Interventional Radiology

## 2015-02-22 DIAGNOSIS — R633 Feeding difficulties, unspecified: Secondary | ICD-10-CM

## 2015-02-28 ENCOUNTER — Other Ambulatory Visit (HOSPITAL_COMMUNITY): Payer: Medicaid Other

## 2015-03-01 ENCOUNTER — Ambulatory Visit (HOSPITAL_COMMUNITY)
Admission: RE | Admit: 2015-03-01 | Discharge: 2015-03-01 | Disposition: A | Discharge status: Home / Self Care | Payer: Medicaid Other | Source: Ambulatory Visit | Attending: Interventional Radiology | Admitting: Interventional Radiology

## 2015-03-01 DIAGNOSIS — Z431 Encounter for attention to gastrostomy: Secondary | ICD-10-CM | POA: Diagnosis not present

## 2015-03-01 DIAGNOSIS — R633 Feeding difficulties, unspecified: Secondary | ICD-10-CM

## 2015-03-01 MED ORDER — IOHEXOL 300 MG/ML  SOLN
5.0000 mL | Freq: Once | INTRAMUSCULAR | Status: AC | PRN
  Administered 2015-03-01: 5 mL

## 2015-03-01 NOTE — ED Provider Notes (Signed)
CSN: [id number]     Arrival date & time 02/18/15  0848 History   First MD Initiated Contact with Patient 02/18/15 0901     Chief Complaint  Patient presents with  . feeding tube came out      (Consider location/radiation/quality/duration/timing/severity/associated sxs/prior Treatment) HPI   34 year old female presenting after her gastric tube was dislodged. Patient has a history of cerebral palsy. She's had a feeding tube for quite some time. Balloon broke the tube was dislodged. Family was able to reinsert the tube to the tract and brought her to the emergency room. They report that patient is otherwise acting at her baseline. She is nonverbal.  Past Medical History  Diagnosis Date  . 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 Cortical blindness   . Seizures ([place])   . Mental retardation   . Cerebral palsy ([place])   . Hypertension 06/23/2010  . Vision abnormalities   . Allergic rhinitis 11/04/2012  . CHF (congestive heart failure) ([place])   . COPD (chronic obstructive pulmonary disease) ([place])   . Pneumonia December 2014    Was intubated, [facility]  . Dilantin toxicity August 2014   Past Surgical History  Procedure Laterality Date  . Peg tube placement    . Peg placement  09/03/2011    Procedure: PERCUTANEOUS ENDOSCOPIC GASTROSTOMY (PEG) REPLACEMENT;  Surgeon: Jerene Bears, MD;  [facility]: [facility];  Service: Gastroenterology;  Laterality: N/A;  . Peg placement  02/19/2012    Procedure: PERCUTANEOUS ENDOSCOPIC GASTROSTOMY (PEG) REPLACEMENT;  Surgeon: Lafayette Dragon, MD;  Location: [facility];  Service: Endoscopy;  Laterality: N/A;  needs gastrostomy button 24 Fr 4.4 and 3.5 available  . Peg placement N/A 05/30/2013    Procedure: PERCUTANEOUS ENDOSCOPIC GASTROSTOMY (PEG) REPLACEMENT;  Surgeon: Jerene Bears, MD;  Location: [place];  Service: Gastroenterology;  Laterality: N/A;  . Peg placement N/A 07/03/2013    Procedure: PERCUTANEOUS ENDOSCOPIC GASTROSTOMY (PEG) REPLACEMENT;  Surgeon:  Lafayette Dragon, MD;  [facility];  Service: Endoscopy;  Laterality: N/A;  Replace with 20 Fr x 3.5 cm mini button-   Family History  Problem Relation Age of Onset  . Diabetes Mother   . Diabetes Maternal Grandmother   . Stroke Maternal Grandmother   . Diabetes Maternal Aunt   . Hypertension Other     Fhx  . Cancer Maternal Grandfather     Died at 42   Social History  Substance Use Topics  . Smoking status: Passive Smoke Exposure - Never Smoker  . Smokeless tobacco: Never Used  . Alcohol Use: No   OB History    No data available     Review of Systems  Level V caveat because patient is nonverbal.   Allergies  Doxycycline  Home Medications   Prior to Admission medications   Medication Sig Start Date End Date Taking? Authorizing Provider  AMBULATORY NON FORMULARY MEDICATION Abdominal binder 05/30/13  Yes Jerene Bears, MD  diazepam (DIASTAT ACUDIAL) 10 MG GEL Place 5 mg rectally once. 01/26/14  Yes Frazier Richards, MD  griseofulvin microsize (GRIFULVIN V) 125 MG/5ML suspension Place 20 mLs (500 mg total) into feeding tube daily. For 14 additional days Patient taking differently: Place 500 mg into feeding tube daily. For 14 additional days 10-19 to 11-2 02/11/15  Yes Frazier Richards, MD  guaiFENesin 200 MG tablet Take 1 tablet (200 mg total) by mouth every 4 (four) hours as needed for cough or to loosen phlegm. 04/18/14  Yes Timmothy Euler,  MD  levETIRAcetam (KEPPRA) 100 MG/ML solution GIVE 12.5 MLS IN THE MORNING AND 12.5 MLS IN THE EVENING BY PEG TUBE 10/24/14  Yes Rockwell Germany, NP  levothyroxine (SYNTHROID, LEVOTHROID) 88 MCG tablet TAKE 1 TABLET (88 MCG TOTAL) BY MOUTH DAILY. 07/03/14  Yes Frazier Richards, MD  loratadine (CLARITIN) 5 MG/5ML syrup Place 10 mLs (10 mg total) into feeding tube daily. 10/12/14  Yes Frazier Richards, MD  Nutritional Supplements (JEVITY) LIQD 1 Can by Per J Tube route 3 (three) times daily. 01/24/15  Yes Frazier Richards, MD  ranitidine (ZANTAC) 75  MG/5ML syrup 5 MLS (75 MG TOTAL) BY PER J TUBE ROUTE 2 (TWO) TIMES DAILY. 07/10/14  Yes Frazier Richards, MD  Valproic Acid (DEPAKENE) 250 MG/5ML SYRP syrup GIVE 13 ML BY PEG TUBE EVERY 6 HOURS 08/30/14  Yes Jodi Geralds, MD  VIMPAT 10 MG/ML SOLN GIVE 5MLS BY MOUTH EVERY MORNING AND 5MLS BY MOUTH IN THE EVENING 10/15/14  Yes Rockwell Germany, NP  furosemide (LASIX) 20 MG tablet Take 1 tablet (20 mg total) by mouth daily as needed for fluid. Patient not taking: Reported on 02/18/2015 05/25/14   Bryan R Hess, DO   BP 104/80 mmHg  Pulse 95  Temp(Src) 98.1 F (36.7 C) (Oral)  Resp 18  SpO2 100% Physical Exam  Constitutional: She appears well-developed and well-nourished. No distress.  Laying in bed. Awake. Smiling and appears to be in no acute distress. Contractures consistent with history of cerebral palsy.  HENT:  Head: Normocephalic and atraumatic.  Eyes: Conjunctivae are normal. Right eye exhibits no discharge. Left eye exhibits no discharge.  Neck: Neck supple.  Cardiovascular: Normal rate, regular rhythm and normal heart sounds.  Exam reveals no gallop and no friction rub.   No murmur heard. Pulmonary/Chest: Effort normal and breath sounds normal. No respiratory distress.  Abdominal: Soft. She exhibits no distension. There is no tenderness.  Abdomen with feeding tube in place. Stoma appears unremarkable. Abdomen is flat. Does not seem to be tender.  Musculoskeletal: She exhibits no edema or tenderness.  Neurological: She is alert.  Skin: Skin is warm and dry.  Psychiatric: She has a normal mood and affect. Her behavior is normal. Thought content normal.  Nursing note and vitals reviewed.   ED Course  Procedures (including critical care time) Labs Review Labs Reviewed - No data to display  Imaging Review No results found. I have personally reviewed and evaluated these images and lab results as part of my medical decision-making.   EKG Interpretation None      MDM   Final  diagnoses:  Feeding tube dysfunction, initial encounter    34 year old female with feeding tube which was dislodged. Replaced in IR. Did not have exact sizing so the patient will return to IR to have this switched out again but currently has functional tube for feeds/meds.    Virgel Manifold, MD 03/01/15 [phone]

## 2015-03-30 ENCOUNTER — Other Ambulatory Visit: Payer: Self-pay | Admitting: Pediatrics

## 2015-04-03 ENCOUNTER — Ambulatory Visit (INDEPENDENT_AMBULATORY_CARE_PROVIDER_SITE_OTHER): Payer: Medicaid Other | Admitting: Pediatrics

## 2015-04-03 VITALS — BP 106/72 | HR 90

## 2015-04-03 DIAGNOSIS — H47619 Cortical blindness, unspecified side of brain: Secondary | ICD-10-CM

## 2015-04-03 DIAGNOSIS — G40309 Generalized idiopathic epilepsy and epileptic syndromes, not intractable, without status epilepticus: Secondary | ICD-10-CM | POA: Diagnosis not present

## 2015-04-03 DIAGNOSIS — G8250 Quadriplegia, unspecified: Secondary | ICD-10-CM

## 2015-04-03 DIAGNOSIS — G40209 Localization-related (focal) (partial) symptomatic epilepsy and epileptic syndromes with complex partial seizures, not intractable, without status epilepticus: Secondary | ICD-10-CM | POA: Diagnosis not present

## 2015-04-03 DIAGNOSIS — B350 Tinea barbae and tinea capitis: Secondary | ICD-10-CM

## 2015-04-03 DIAGNOSIS — R1310 Dysphagia, unspecified: Secondary | ICD-10-CM | POA: Diagnosis not present

## 2015-04-03 DIAGNOSIS — F72 Severe intellectual disabilities: Secondary | ICD-10-CM

## 2015-04-03 MED ORDER — VIMPAT 10 MG/ML PO SOLN: ORAL | Status: DC

## 2015-04-03 MED ORDER — LEVETIRACETAM 100 MG/ML PO SOLN: ORAL | Status: DC

## 2015-04-03 NOTE — Progress Notes (Signed)
Patient: Megan [name] MRN: [id number] Sex: female DOB: [birth year]  Provider: Jodi Geralds, MD Location of Care: [facility]  Note type: Routine return visit  History of Present Illness: Referral Source: [facility] History from: cousin and [facility] chart Chief Complaint: Seizures  Megan [name] is a 34 y.o. female who presents for 6 month follow-up visit (last visit 08/30/2014). 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 Since her last visit, her cousin reports she has overall been doing well. She has had no seizure activity. Last seizure was during a prior illness >1 year ago.   She continues to have issues with sleep that are unchanged from baseline. Cousin reports she "has her days and nights mixed up," and will often take naps during the day (sometimes falling asleep at school) and will lay awake at night laughing. She does not appear to be in pain when she is up at night.   Megan [name] lives with her grandmother who is her primary caregiver, but her cousin often helps care for her in the evenings after she returns home from her adult day program. She continues to attend [employer] during the day, and is transported to and from by bus. Her other cousin helps care for her in the mornings. She does not have a lift at home, but cousins are able to move her from chair to bed given her small size.   She has had no hospitalizations since her last appt ~7 months ago, with one ED visit to have her G-tube changed.   The only concern her cousin notes today is a continued complaint of "bumps or boils" on the back of her head, which get larger and appear to drain "pus." She has been evaluated by her primary care doctor for this concern, most recently on 10/06. She was diagnosed with tinea capitis  (without bacterial superinfection, per chart review) and treated with griseofulvin x6 weeks. Cousin reports no improvement on the antifungal. She does note that Megan [name] was supposed to follow-up with her PCP if the lesions didn't improve, but family hasn't yet scheduled a follow-up appointment.  Review of Systems: 12 system review was unremarkable  Past Medical History Diagnosis Date  . 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 Cortical blindness   . Seizures ([place])   . Mental retardation   . Cerebral palsy ([place])   . Hypertension 06/23/2010  . Vision abnormalities   . Allergic rhinitis 11/04/2012  . CHF (congestive heart failure) ([place])   . COPD (chronic obstructive pulmonary disease) ([place])   . Pneumonia December 2014    Was intubated, [facility]  . Dilantin toxicity August 2014   Hospitalizations: Yes.  , Head Injury: No., Nervous System Infections: No., Immunizations up to date: Yes.    Behavior History none  Surgical History Procedure Laterality Date  . Peg tube placement    . Peg placement  09/03/2011    Procedure: PERCUTANEOUS ENDOSCOPIC GASTROSTOMY (PEG) REPLACEMENT;  Surgeon: Jerene Bears, MD;  [facility];  Service: Gastroenterology;  Laterality: N/A;  . Peg placement  02/19/2012    Procedure: PERCUTANEOUS ENDOSCOPIC GASTROSTOMY (PEG) REPLACEMENT;  Surgeon: Lafayette Dragon, MD;  [facility];  Service: Endoscopy;  Laterality: N/A;  needs gastrostomy button 24 Fr 4.4 and 3.5 available  . Peg placement N/A 05/30/2013    Procedure: PERCUTANEOUS ENDOSCOPIC GASTROSTOMY (PEG) REPLACEMENT;  Surgeon: Jerene Bears, MD;  Location: [place];  Service: Gastroenterology;  Laterality: N/A;  . Peg placement N/A 07/03/2013    Procedure: PERCUTANEOUS ENDOSCOPIC GASTROSTOMY (PEG) REPLACEMENT;  Surgeon: Lafayette Dragon, MD;  [facility];  Service: Endoscopy;  Laterality: N/A;  Replace with 20 Fr x 3.5 cm mini button-   Family History family history includes Cancer in her  maternal grandfather; Diabetes in her maternal aunt, maternal grandmother, and mother; Hypertension in her other; Stroke in her maternal grandmother. Family history is negative for migraines, seizures, intellectual disabilities, blindness, deafness, birth defects, chromosomal disorder, or autism.  Social History . Marital Status: Single    Spouse Name: N/A  . Number of Children: N/A  . Years of Education: N/A   Social History Main Topics  . Smoking status: Passive Smoke Exposure - Never Smoker  . Smokeless tobacco: Never Used  . Alcohol Use: No  . Drug Use: No  . Sexual Activity: No   Social History Narrative    Megan [name] attends [employer] and does well. She lives with her grandmother/guardian, Megan [name]. She enjoys exercising, standing and music.   Allergies Allergen Reactions  . Doxycycline Rash    REACTION: Blisters / swelling   Physical Exam BP 106/72 mmHg  Pulse 90 General: awake, well developed, seated in wheelchair in no acute distress, short black hair, brown eyes Head: normocephalic, atruamatic Ears, Nose and Throat: Otoscopic: tympanic membranes occluded by cerumen; pharynx: unable to examine due to patient's ability to cooperate. Her tongue protrudes, teeth are crowded Neck: supple, unable to maintain head control Respiratory: auscultation clear Cardiovascular: no murmurs, pulses are normal Musculoskeletal: contractions of extremities at the knees, elbows, hands, and feet. Truncal hypotonia Skin: 3 large, firm, roughened, papular lesions on back of scalp, not draining without surrounding erythema  Neurologic Exam  Mental Status: awake, smiles occasionally when cousin interacts with her. Evidence of profound intellectual delay. She has no speech, but occasionally makes grunting noises. Cranial Nerves: Unable to evaluate EOM due to inability to follow instructions, but she has disconjugate eye movements; pupils are round reactive to light; funduscopic examination  shows positive red reflex; turns to localized sounds but not consistently; lower facial weakness with drooling; tongue protrudes at all times; unable to adequately evaluate neck flexion and extension Motor: Spastic quadriplegia Sensory: Withdraws to noxious stimuli, +Babinski bilatreally Coordination: cannot cooperate with exam Gait and Station: non-ambulatory Reflexes: diminished throughout  Assessment Megan [name] is a 34 year old female with generalized convulsive epilepsy, localization related complex partial seizures, spastic quadriparesis, and cortical blindness who presents for 6 months follow-up of seizure disorder and is doing well without seizures in the interim.  1. 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   2. 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   3. Localization-related symptomatic epilepsy and epileptic syndromes with complex partial seizures, not intractable, without status epilepticus ([place]), G40.209.   4. 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   5. Dysphagia, R13.10.  6. 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  7. Tinea capitis, B35.0.    Discussion I am pleased that we have been able to keep her seizures under control on her current antiepileptic medication regimen.   Plan - Continue current AED regimen. Prescriptions were refilled for levetiracetam and Vimpat. - Recommend follow-up with PCP for further management of scalp complaints.   Medication List   This list is accurate as of: 04/03/15 10:58 AM.       diazepam 10 MG Gel  Commonly known as:  DIASTAT ACUDIAL  Place 5 mg rectally once.     furosemide 20 MG tablet  Commonly known as:  LASIX  Take 1 tablet (20 mg total) by mouth daily as needed for fluid.     griseofulvin microsize 125 MG/5ML suspension  Commonly known as:  GRIFULVIN V  Place 20 mLs (500 mg total) into feeding tube daily. For 14 additional days     guaiFENesin 200 MG tablet  Take 1 tablet (200 mg  total) by mouth every 4 (four) hours as needed for cough or to loosen phlegm.     JEVITY Liqd  1 Can by Per J Tube route 3 (three) times daily.     levETIRAcetam 100 MG/ML solution  Commonly known as:  KEPPRA  GIVE 12.5 MLS IN THE MORNING AND 12.5 MLS IN THE EVENING BY PEG TUBE     levothyroxine 88 MCG tablet  Commonly known as:  SYNTHROID, LEVOTHROID  TAKE 1 TABLET (88 MCG TOTAL) BY MOUTH DAILY.     loratadine 5 MG/5ML syrup  Commonly known as:  CLARITIN  Place 10 mLs (10 mg total) into feeding tube daily.     ranitidine 75 MG/5ML syrup  Commonly known as:  ZANTAC  5 MLS (75 MG TOTAL) BY PER J TUBE ROUTE 2 (TWO) TIMES DAILY.     valproic acid 250 MG/5ML syrup  Commonly known as:  DEPAKENE  GIVE 13 ML BY PEG TUBE EVERY 6 HOURS     VIMPAT 10 MG/ML oral solution  Generic drug:  lacosamide  GIVE 5MLS BY MOUTH EVERY MORNING AND 5MLS BY MOUTH IN THE EVENING      The medication list was reviewed and reconciled. All changes or newly prescribed medications were explained.  A complete medication list was provided to the patient/caregiver.  Ashley Hilzendager, [facility] - PL[facility]  30 minutes of face-to-face time was spent with Megan [name] and her cousin, more than half of it in consultation.  I performed physical examination, participated in history taking, and guided decision making.  Jodi Geralds MD

## 2015-04-24 ENCOUNTER — Telehealth: Payer: Self-pay | Admitting: Family Medicine

## 2015-04-24 NOTE — Telephone Encounter (Signed)
Pt needs Nutritional Supplements (JEVITY) LIQD [facility] and it needs to be "4 cans instead of 3". She states that they are "threatening to cut it off". Please advise at earliest convenience. Thank you, Fonda Kinder, ASA

## 2015-04-26 NOTE — Telephone Encounter (Signed)
Written Rx faxed to [facility] today for 4 cans of jevity daily.

## 2015-05-25 ENCOUNTER — Other Ambulatory Visit: Payer: Self-pay | Admitting: Family

## 2015-06-07 ENCOUNTER — Telehealth: Payer: Self-pay | Admitting: Family Medicine

## 2015-06-07 NOTE — Telephone Encounter (Signed)
Form dropped off for handicap placard renewal. Fonda Kinder, ASA

## 2015-06-10 NOTE — Telephone Encounter (Signed)
Placed in md box. Adea Geisel Kennon Holter, CMA

## 2015-06-10 NOTE — Telephone Encounter (Signed)
Patient informed that Placard form is complete and ready for pick up.  Derl Barrow, RN

## 2015-06-16 ENCOUNTER — Other Ambulatory Visit: Payer: Self-pay | Admitting: Pediatrics

## 2015-06-22 ENCOUNTER — Other Ambulatory Visit: Payer: Self-pay | Admitting: Family Medicine

## 2015-06-22 DIAGNOSIS — E039 Hypothyroidism, unspecified: Secondary | ICD-10-CM

## 2015-07-02 ENCOUNTER — Ambulatory Visit (INDEPENDENT_AMBULATORY_CARE_PROVIDER_SITE_OTHER): Payer: Medicaid Other | Admitting: Family Medicine

## 2015-07-02 VITALS — BP 110/58 | HR 91

## 2015-07-02 DIAGNOSIS — J302 Other seasonal allergic rhinitis: Secondary | ICD-10-CM

## 2015-07-02 DIAGNOSIS — L218 Other seborrheic dermatitis: Secondary | ICD-10-CM

## 2015-07-02 DIAGNOSIS — L219 Seborrheic dermatitis, unspecified: Secondary | ICD-10-CM | POA: Insufficient documentation

## 2015-07-02 MED ORDER — SELENIUM SULFIDE 2.25 % EX SHAM: 1.0000 "application " | MEDICATED_SHAMPOO | CUTANEOUS | Status: DC

## 2015-07-02 MED ORDER — LORATADINE 5 MG/5ML PO SYRP: 10.0000 mg | ORAL_SOLUTION | Freq: Every day | ORAL | Status: AC

## 2015-07-05 NOTE — Progress Notes (Signed)
   Subjective:   Megan [name] is a 35 y.o. female with a history of MRCP and recent tinea capitis here for f/u scalp problems and cough  Caregiver reports patients scalp improved dramatically on griseofulvin but never completely cleared. For the past few weeks it has started to develop bumps and flaking without the boils or redness that were present before. No fevers or rashes elsewhere  COUGH  Has been coughing for 14 days. Cough is: dry, occasional Sputum production: no Medications tried: none Taking blood pressure medications: no  Symptoms Runny nose: mild Mucous in back of throat: no Throat burning or reflux: no Wheezing or asthma: no Fever: no Chest Pain: unknown Shortness of breath: unknown, non apparent to caregivers Leg swelling: no Hemoptysis: no Weight loss: no  Review of Systems:  Per HPI. All other systems reviewed and are negative.   PMH, PSH, Medications, Allergies, and FmHx reviewed and updated in EMR.  Social History: never smoker  Objective:  BP 110/58 mmHg  Pulse 91  Gen:  35 y.o. female in NAD HEENT: NCAT, MMM, EOMI, PERRL, anicteric sclerae CV: RRR, no MRG, no JVD Resp: Non-labored, CTAB, no wheezes noted Abd: Soft, NTND, BS present, no guarding or organomegaly Ext: WWP, no edema MSK: Full ROM, strength intact Neuro: Alert and oriented, speech normal      Chemistry      Component Value Date/Time   NA 141 01/07/2015 1603   K 5.1 01/07/2015 1603   CL 100 01/07/2015 1603   CO2 30 01/07/2015 1603   BUN 17 01/07/2015 1603   CREATININE 0.97 01/07/2015 1603   CREATININE 0.55 01/12/2014 0650      Component Value Date/Time   CALCIUM 9.3 01/07/2015 1603   ALKPHOS 101 01/07/2015 1603   AST 22 01/07/2015 1603   ALT 10 01/07/2015 1603   BILITOT 0.3 01/07/2015 1603      Lab Results  Component Value Date   WBC 6.1 01/07/2015   HGB 11.1* 01/07/2015   HCT 32.8* 01/07/2015   MCV 103.8* 01/07/2015   PLT 176 01/07/2015   Lab Results    Component Value Date   TSH 0.488 01/11/2014   Lab Results  Component Value Date   HGBA1C * 05/02/2007    4.5 (NOTE)   The ADA recommends the following therapeutic goals for glycemic   control related to Hgb A1C measurement:   Goal of Therapy:   < 7.0% Hgb A1C   Action Suggested:  > 8.0% Hgb A1C   Ref:  Diabetes Care, 22, Suppl. 1, 1999   Assessment & Plan:     Megan [name] is a 35 y.o. female here for cough and scalp rash  Allergic rhinitis 2 weeks of mild cough and rhinorrhea, lungs clear and no fevers - restart claritin - f/u in 1 week if not improving, sooner if worsening or febrile  Seborrheic dermatitis of scalp Completed several months of griseo for tinea capitis, much improved but still with flaking and irritation of posterior scalp - start selsun blue 2x/week - f/u in 2 weeks if not improving      Megan Roux, MD, MPH [place] PGY-3 07/05/2015 2:04 PM

## 2015-07-05 NOTE — Assessment & Plan Note (Signed)
Completed several months of griseo for tinea capitis, much improved but still with flaking and irritation of posterior scalp - start selsun blue 2x/week - f/u in 2 weeks if not improving

## 2015-07-05 NOTE — Assessment & Plan Note (Signed)
2 weeks of mild cough and rhinorrhea, lungs clear and no fevers - restart claritin - f/u in 1 week if not improving, sooner if worsening or febrile

## 2015-07-26 ENCOUNTER — Other Ambulatory Visit: Payer: Self-pay | Admitting: Family Medicine

## 2015-07-26 DIAGNOSIS — K219 Gastro-esophageal reflux disease without esophagitis: Secondary | ICD-10-CM

## 2015-09-16 ENCOUNTER — Ambulatory Visit (INDEPENDENT_AMBULATORY_CARE_PROVIDER_SITE_OTHER): Payer: Medicaid Other | Admitting: Family Medicine

## 2015-09-16 ENCOUNTER — Encounter: Payer: Self-pay | Admitting: Family Medicine

## 2015-09-16 VITALS — BP 96/50 | HR 102 | Temp 97.5°F

## 2015-09-16 DIAGNOSIS — G40309 Generalized idiopathic epilepsy and epileptic syndromes, not intractable, without status epilepticus: Secondary | ICD-10-CM | POA: Diagnosis not present

## 2015-09-16 DIAGNOSIS — R05 Cough: Secondary | ICD-10-CM | POA: Diagnosis not present

## 2015-09-16 DIAGNOSIS — R059 Cough, unspecified: Secondary | ICD-10-CM

## 2015-09-16 DIAGNOSIS — G809 Cerebral palsy, unspecified: Secondary | ICD-10-CM

## 2015-09-16 DIAGNOSIS — Z0000 Encounter for general adult medical examination without abnormal findings: Secondary | ICD-10-CM

## 2015-09-16 DIAGNOSIS — F72 Severe intellectual disabilities: Secondary | ICD-10-CM

## 2015-09-17 ENCOUNTER — Telehealth: Payer: Self-pay | Admitting: *Deleted

## 2015-09-17 NOTE — Telephone Encounter (Signed)
Patient's grandmother informed that forms were complete and ready for pick up.  Derl Barrow, RN

## 2015-09-18 NOTE — Assessment & Plan Note (Signed)
Nonverbal, nonambulatory, dependent in all ADL's - stable without new concerns

## 2015-09-18 NOTE — Assessment & Plan Note (Signed)
Occasional nonsevere cough, no fevers, no focal findings on exam - rec claritin for allergies, likely related to poor management of secretions +/- allergies - monitor for worsening and alert me immediately of any change or fever

## 2015-09-18 NOTE — Assessment & Plan Note (Signed)
Care from family and at [facility] [employer], doing well - completed annual [facility] [employer] paperwork

## 2015-09-18 NOTE — Assessment & Plan Note (Signed)
Managed by [facility], stable on current meds without recent seizure activity

## 2015-09-18 NOTE — Progress Notes (Signed)
   Subjective:    Patient ID: Megan [name], female    DOB: Mar 21, 1981, 35 y.o.   MRN: [id number]  HPI Pt presents for annual exam and completion of adult daycare forms. Patient has been doing well other than some occasional cough/congestion.    Review of Systems  Constitutional: Negative for fever and diaphoresis.  HENT: Positive for congestion.   Respiratory: Positive for cough. Negative for shortness of breath.   Gastrointestinal: Negative for vomiting and diarrhea.  Skin: Negative for rash.  All other systems reviewed and are negative.      Objective:   Physical Exam  Constitutional: She appears well-nourished. No distress.  Chronically ill-appear spastic quadriplegic  HENT:  Head: Normocephalic and atraumatic.  Eyes: Conjunctivae are normal. Pupils are equal, round, and reactive to light. Right eye exhibits no discharge. Left eye exhibits no discharge.  Cardiovascular: Normal rate, regular rhythm, normal heart sounds and intact distal pulses.   No murmur heard. Pulmonary/Chest: Effort normal. No respiratory distress. She has no wheezes.  Coarse upper airway sounds  Abdominal: Soft. Bowel sounds are normal. She exhibits no distension. There is no tenderness.  g-tube site well healed   Musculoskeletal: She exhibits no edema.  Severe rigidity of all extremities and torso  Neurological:  Not oriented, nonverbal, no interaction/response  Skin: Skin is warm and dry. No rash noted. She is not diaphoretic.  Nursing note and vitals reviewed.         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

## 2015-09-25 ENCOUNTER — Telehealth: Payer: Self-pay | Admitting: Family Medicine

## 2015-09-25 NOTE — Telephone Encounter (Signed)
Form placed in PCP box 

## 2015-09-25 NOTE — Telephone Encounter (Signed)
Patient's Cousin asks PCP to complete from ASAP. Please, follow up.

## 2015-09-26 NOTE — Telephone Encounter (Signed)
Left message for patient's care giver that form is complete and ready for pick up.  Derl Barrow, RN

## 2015-09-26 NOTE — Telephone Encounter (Signed)
Completed and returned to [place].  

## 2015-09-30 ENCOUNTER — Telehealth: Payer: Self-pay | Admitting: Family Medicine

## 2015-09-30 NOTE — Telephone Encounter (Signed)
Forms placed in MD box.

## 2015-09-30 NOTE — Telephone Encounter (Signed)
Patient's Cousin asks PCP to complete and sign School forms. Please, follow up.

## 2015-10-02 NOTE — Telephone Encounter (Signed)
Patient's grandmother informed that forms were signed and ready for pick up.  Derl Barrow, RN

## 2015-10-11 ENCOUNTER — Other Ambulatory Visit: Payer: Self-pay | Admitting: Pediatrics

## 2015-11-13 ENCOUNTER — Encounter: Payer: Self-pay | Admitting: Pediatrics

## 2015-11-13 ENCOUNTER — Ambulatory Visit (INDEPENDENT_AMBULATORY_CARE_PROVIDER_SITE_OTHER): Payer: Medicaid Other | Admitting: Family

## 2015-11-13 VITALS — BP 110/70 | HR 86

## 2015-11-13 DIAGNOSIS — H47619 Cortical blindness, unspecified side of brain: Secondary | ICD-10-CM | POA: Diagnosis not present

## 2015-11-13 DIAGNOSIS — G8250 Quadriplegia, unspecified: Secondary | ICD-10-CM

## 2015-11-13 DIAGNOSIS — F72 Severe intellectual disabilities: Secondary | ICD-10-CM | POA: Diagnosis not present

## 2015-11-13 DIAGNOSIS — G40309 Generalized idiopathic epilepsy and epileptic syndromes, not intractable, without status epilepticus: Secondary | ICD-10-CM | POA: Diagnosis not present

## 2015-11-13 DIAGNOSIS — G40209 Localization-related (focal) (partial) symptomatic epilepsy and epileptic syndromes with complex partial seizures, not intractable, without status epilepticus: Secondary | ICD-10-CM | POA: Diagnosis not present

## 2015-11-13 MED ORDER — VALPROATE SODIUM 250 MG/5ML PO SOLN: ORAL | Status: DC

## 2015-11-13 MED ORDER — LACOSAMIDE 10 MG/ML PO SOLN: ORAL | Status: DC

## 2015-11-13 MED ORDER — LEVETIRACETAM 100 MG/ML PO SOLN: ORAL | Status: DC

## 2015-11-13 NOTE — Progress Notes (Signed)
Patient: Megan [name] MRN: [id number] Sex: female DOB: 1980-10-06  Provider: Jodi Geralds, MD Location of Care: [facility]  Note type: Routine return visit  History of Present Illness: Referral Source: Dr. Georges Lynch History from: referring office, [facility] chart and care taker Chief Complaint: Seizures  Megan [name] is a 35 y.o. 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 She was last seen by [name] on April 03, 2015. Since her last visit, her cousin reports she has overall been doing well. She has had no seizure activity. Her last seizure occurred in September 2015, when she experienced a flurry of seizures that required hospitalization.   Megan [name] lives with her grandmother who is her primary caregiver, but her cousin often helps care for her in the evenings after she returns home from her adult day program. She continues to attend [employer] during the day, and is transported to and from by bus. Her other cousin helps care for her in the mornings.  Megan [name] has been otherwise generally healthy since she was last seen and her cousin has no other health concerns for her today other than previously mentioned.  Review of Systems: Please see the HPI for neurologic and other pertinent review of systems. Otherwise, the following systems are noncontributory including constitutional, eyes, ears, nose and throat, cardiovascular, respiratory, gastrointestinal, genitourinary, musculoskeletal, skin, endocrine, hematologic/lymph, allergic/immunologic and psychiatric.   Past Medical History  Diagnosis Date  . Cerebral palsy, quadriplegic ([place])   . Cortical blindness   . Seizures ([place])   . Mental retardation   . Cerebral palsy ([place])   . Hypertension 06/23/2010  . Vision  abnormalities   . Allergic rhinitis 11/04/2012  . CHF (congestive heart failure) ([place])   . COPD (chronic obstructive pulmonary disease) ([place])   . Pneumonia December 2014    Was intubated, [facility]  . Dilantin toxicity August 2014   Hospitalizations: No., Head Injury: No., Nervous System Infections: No., Immunizations up to date: Yes.   Past Medical History Comments: See history  Surgical History Past Surgical History  Procedure Laterality Date  . Peg tube placement    . Peg placement  09/03/2011    Procedure: PERCUTANEOUS ENDOSCOPIC GASTROSTOMY (PEG) REPLACEMENT;  Surgeon: Jerene Bears, MD;  Location: [facility];  Service: Gastroenterology;  Laterality: N/A;  . Peg placement  02/19/2012    Procedure: PERCUTANEOUS ENDOSCOPIC GASTROSTOMY (PEG) REPLACEMENT;  Surgeon: Lafayette Dragon, MD;  Location: [facility];  Service: Endoscopy;  Laterality: N/A;  needs gastrostomy button 24 Fr 4.4 and 3.5 available  . Peg placement N/A 05/30/2013    Procedure: PERCUTANEOUS ENDOSCOPIC GASTROSTOMY (PEG) REPLACEMENT;  Surgeon: Jerene Bears, MD;  Location: [place];  Service: Gastroenterology;  Laterality: N/A;  . Peg placement N/A 07/03/2013    Procedure: PERCUTANEOUS ENDOSCOPIC GASTROSTOMY (PEG) REPLACEMENT;  Surgeon: Lafayette Dragon, MD;  Location: [facility];  Service: Endoscopy;  Laterality: N/A;  Replace with 20 Fr x 3.5 cm mini button-    Family History family history includes Cancer in her maternal grandfather; Diabetes in her maternal aunt, maternal grandmother, and mother; Hypertension in her other; Stroke in her maternal grandmother. Family History is otherwise negative for migraines, seizures, cognitive impairment, blindness, deafness, birth defects, chromosomal disorder, autism.  Social History Social History   Social History  . Marital Status: Single    Spouse Name: N/A  .  Number of Children: N/A  . Years of Education: N/A   Social History Main Topics  . Smoking status: Never Smoker   .  Smokeless tobacco: Never Used  . Alcohol Use: No  . Drug Use: No  . Sexual Activity: No   Other Topics Concern  . Not on file   Social History Narrative   Megan [name] attends [employer] and does well. She lives with her grandmother/guardian, Megan [name]. She enjoys exercising, standing and music.    Allergies Allergies  Allergen Reactions  . Other     Seasonal Allergies    . Doxycycline Rash    REACTION: Blisters / swelling    Physical Exam BP 110/70 mmHg  Pulse 86  Ht   Wt   LMP 10/28/2015 (Within Days) General: awake, well developed, seated in wheelchair in no acute distress, short black hair, brown eyes Head: normocephalic, atruamatic Ears, Nose and Throat: Otoscopic: tympanic membranes occluded by cerumen; pharynx: unable to examine due to patient's ability to cooperate. Her tongue protrudes, teeth are crowded Neck: supple, unable to maintain head control Respiratory: auscultation clear Cardiovascular: no murmurs, pulses are normal Musculoskeletal: contractions of extremities at the knees, elbows, hands, and feet. Truncal hypotonia Skin: intact, no rashes or lesions  Neurologic Exam  Mental Status: awake, smiles occasionally when cousin interacts with her. Evidence of profound intellectual delay. She has no speech, but occasionally makes grunting noises. Cranial Nerves: Unable to evaluate EOM due to inability to follow instructions, but she has disconjugate eye movements; pupils are round reactive to light; funduscopic examination shows positive red reflex; turns to localized sounds but not consistently; lower facial weakness with drooling; tongue protrudes at all times; unable to adequately evaluate neck flexion and extension Motor: Spastic quadriplegia Sensory: Withdraws to noxious stimuli Coordination: unable to cooperate with exam Gait and Station: non-ambulatory Reflexes: diminished throughout  Impression 1.  Spastic quadriparesis 2.  Generalized convulsive  epilepsy without intractable epilepsy 3.  Localization related epilepsy, not intractable 4.  Dysphagia 5.  Cortical blindness 6.  Severe intellectual disabilities   Recommendations for plan of care The patient's previous [facility] records were reviewed. Megan [name] has neither had nor required imaging or lab studies since the last visit. 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 Megan [name] has been generally healthy and has remained seizure free since September 2015. She will continue on her medications without change and will return for follow up in 6 months or sooner if needed.   The medication list was reviewed and reconciled.  No changes were made in the prescribed medications today.  A complete medication list was provided to the patient/caregiver.    Medication List       This list is accurate as of: 11/13/15  2:34 PM.  Always use your most recent med list.               diazepam 10 MG Gel  Commonly known as:  DIASTAT ACUDIAL  Place 5 mg rectally once.     fluticasone 50 MCG/ACT nasal spray  Commonly known as:  FLONASE  Place into the nose. Reported on 11/13/2015     guaiFENesin 200 MG tablet  Take 1 tablet (200 mg total) by mouth every 4 (four) hours as needed for cough or to loosen phlegm.     JEVITY Liqd  1 Can  by Per J Tube route 3 (three) times daily.     levETIRAcetam 100 MG/ML solution  Commonly known as:  KEPPRA  GIVE 12.5 MLS IN THE MORNING AND 12.5 MLS IN THE EVENING BY PEG TUBE     levothyroxine 88 MCG tablet  Commonly known as:  SYNTHROID, LEVOTHROID  TAKE 1 TABLET (88 MCG TOTAL) BY MOUTH DAILY.     loratadine 5 MG/5ML syrup  Commonly known as:  CLARITIN  Place 10 mLs (10 mg total) into feeding tube daily.     ranitidine 75 MG/5ML syrup  Commonly known as:   ZANTAC  TAKE 5 MLS (75 MG TOTAL) BY PER J TUBE ROUTE 2 (TWO) TIMES DAILY.     valproic acid 250 MG/5ML syrup  Commonly known as:  DEPAKENE  GIVE 13 ML BY PEG TUBE EVERY 6 HOURS     VIMPAT 10 MG/ML oral solution  Generic drug:  lacosamide  GIVE 5 ML BY MOUTH EVERY MORNING AND 5 ML BY MOUTH IN THE EVENING        Dr. Gaynell Face was consulted regarding the patient.   Total time spent with the patient was 20 minutes, of which 50% or more was spent in counseling and coordination of care.   Rockwell [place]

## 2015-11-13 NOTE — Patient Instructions (Signed)
Continue giving [id number] medicine as you have been giving it. Let me know if she has any seizures.   Please plan to return for follow up in 6 months or sooner if needed.

## 2015-11-14 ENCOUNTER — Encounter: Payer: Self-pay | Admitting: Family

## 2015-12-31 ENCOUNTER — Encounter: Payer: Self-pay | Admitting: Family Medicine

## 2015-12-31 ENCOUNTER — Inpatient Hospital Stay (HOSPITAL_COMMUNITY)
Admission: EM | Admit: 2015-12-31 | Discharge: 2016-01-02 | DRG: 871 | Disposition: A | Discharge status: Home / Self Care | Payer: Medicaid Other | Attending: Family Medicine | Admitting: Family Medicine

## 2015-12-31 ENCOUNTER — Other Ambulatory Visit: Payer: Self-pay

## 2015-12-31 ENCOUNTER — Emergency Department (HOSPITAL_COMMUNITY): Payer: Medicaid Other

## 2015-12-31 ENCOUNTER — Encounter (HOSPITAL_COMMUNITY): Payer: Self-pay | Admitting: *Deleted

## 2015-12-31 ENCOUNTER — Ambulatory Visit (INDEPENDENT_AMBULATORY_CARE_PROVIDER_SITE_OTHER): Payer: Medicaid Other | Admitting: Family Medicine

## 2015-12-31 VITALS — BP 130/65 | HR 130 | Temp 98.5°F

## 2015-12-31 DIAGNOSIS — Z833 Family history of diabetes mellitus: Secondary | ICD-10-CM

## 2015-12-31 DIAGNOSIS — I10 Essential (primary) hypertension: Secondary | ICD-10-CM | POA: Diagnosis not present

## 2015-12-31 DIAGNOSIS — R509 Fever, unspecified: Secondary | ICD-10-CM | POA: Diagnosis present

## 2015-12-31 DIAGNOSIS — G40309 Generalized idiopathic epilepsy and epileptic syndromes, not intractable, without status epilepticus: Secondary | ICD-10-CM | POA: Diagnosis present

## 2015-12-31 DIAGNOSIS — Y95 Nosocomial condition: Secondary | ICD-10-CM | POA: Diagnosis present

## 2015-12-31 DIAGNOSIS — Z931 Gastrostomy status: Secondary | ICD-10-CM

## 2015-12-31 DIAGNOSIS — I110 Hypertensive heart disease with heart failure: Secondary | ICD-10-CM | POA: Diagnosis present

## 2015-12-31 DIAGNOSIS — H543 Unqualified visual loss, both eyes: Secondary | ICD-10-CM | POA: Diagnosis present

## 2015-12-31 DIAGNOSIS — A419 Sepsis, unspecified organism: Principal | ICD-10-CM | POA: Diagnosis present

## 2015-12-31 DIAGNOSIS — J189 Pneumonia, unspecified organism: Secondary | ICD-10-CM | POA: Diagnosis present

## 2015-12-31 DIAGNOSIS — R739 Hyperglycemia, unspecified: Secondary | ICD-10-CM | POA: Diagnosis present

## 2015-12-31 DIAGNOSIS — Z79899 Other long term (current) drug therapy: Secondary | ICD-10-CM | POA: Diagnosis not present

## 2015-12-31 DIAGNOSIS — J440 Chronic obstructive pulmonary disease with acute lower respiratory infection: Secondary | ICD-10-CM | POA: Diagnosis present

## 2015-12-31 DIAGNOSIS — I509 Heart failure, unspecified: Secondary | ICD-10-CM | POA: Diagnosis present

## 2015-12-31 DIAGNOSIS — E039 Hypothyroidism, unspecified: Secondary | ICD-10-CM | POA: Diagnosis present

## 2015-12-31 DIAGNOSIS — D649 Anemia, unspecified: Secondary | ICD-10-CM | POA: Diagnosis present

## 2015-12-31 DIAGNOSIS — N179 Acute kidney failure, unspecified: Secondary | ICD-10-CM | POA: Diagnosis present

## 2015-12-31 DIAGNOSIS — H540 Blindness, both eyes: Secondary | ICD-10-CM | POA: Diagnosis present

## 2015-12-31 DIAGNOSIS — F72 Severe intellectual disabilities: Secondary | ICD-10-CM | POA: Diagnosis present

## 2015-12-31 DIAGNOSIS — Z809 Family history of malignant neoplasm, unspecified: Secondary | ICD-10-CM | POA: Diagnosis not present

## 2015-12-31 DIAGNOSIS — G40209 Localization-related (focal) (partial) symptomatic epilepsy and epileptic syndromes with complex partial seizures, not intractable, without status epilepticus: Secondary | ICD-10-CM | POA: Diagnosis present

## 2015-12-31 DIAGNOSIS — G8250 Quadriplegia, unspecified: Secondary | ICD-10-CM | POA: Diagnosis present

## 2015-12-31 DIAGNOSIS — Z8249 Family history of ischemic heart disease and other diseases of the circulatory system: Secondary | ICD-10-CM

## 2015-12-31 DIAGNOSIS — R05 Cough: Secondary | ICD-10-CM

## 2015-12-31 DIAGNOSIS — Z823 Family history of stroke: Secondary | ICD-10-CM | POA: Diagnosis not present

## 2015-12-31 DIAGNOSIS — R059 Cough, unspecified: Secondary | ICD-10-CM

## 2015-12-31 DIAGNOSIS — G809 Cerebral palsy, unspecified: Secondary | ICD-10-CM | POA: Diagnosis present

## 2015-12-31 DIAGNOSIS — G808 Other cerebral palsy: Secondary | ICD-10-CM | POA: Diagnosis present

## 2015-12-31 DIAGNOSIS — R000 Tachycardia, unspecified: Secondary | ICD-10-CM

## 2015-12-31 LAB — CBC WITH DIFFERENTIAL/PLATELET
BLASTS: 0 %
Band Neutrophils: 13 %
Basophils Absolute: 0 10*3/uL (ref 0.0–0.1)
Basophils Relative: 0 %
Eosinophils Absolute: 0 10*3/uL (ref 0.0–0.7)
Eosinophils Relative: 0 %
HEMATOCRIT: 35.3 % — AB (ref 36.0–46.0)
Hemoglobin: 10.7 g/dL — ABNORMAL LOW (ref 12.0–15.0)
LYMPHS PCT: 51 %
Lymphs Abs: 6.5 10*3/uL — ABNORMAL HIGH (ref 0.7–4.0)
MCH: 33.9 pg (ref 26.0–34.0)
MCHC: 30.3 g/dL (ref 30.0–36.0)
MCV: 111.7 fL — AB (ref 78.0–100.0)
METAMYELOCYTES PCT: 7 %
MONOS PCT: 15 %
Monocytes Absolute: 1.9 10*3/uL — ABNORMAL HIGH (ref 0.1–1.0)
Myelocytes: 0 %
NEUTROS ABS: 4.3 10*3/uL (ref 1.7–7.7)
Neutrophils Relative %: 2 %
OTHER: 0 %
PLATELETS: 135 10*3/uL — AB (ref 150–400)
Promyelocytes Absolute: 12 %
RBC: 3.16 MIL/uL — AB (ref 3.87–5.11)
RDW: 17.3 % — AB (ref 11.5–15.5)
WBC: 12.7 10*3/uL — AB (ref 4.0–10.5)
nRBC: 1 /100 WBC — ABNORMAL HIGH

## 2015-12-31 LAB — COMPREHENSIVE METABOLIC PANEL
ALBUMIN: 2.7 g/dL — AB (ref 3.5–5.0)
ALT: 11 U/L — ABNORMAL LOW (ref 14–54)
AST: 22 U/L (ref 15–41)
Alkaline Phosphatase: 82 U/L (ref 38–126)
Anion gap: 7 (ref 5–15)
BUN: 21 mg/dL — AB (ref 6–20)
CHLORIDE: 103 mmol/L (ref 101–111)
CO2: 31 mmol/L (ref 22–32)
Calcium: 9.7 mg/dL (ref 8.9–10.3)
Creatinine, Ser: 1.78 mg/dL — ABNORMAL HIGH (ref 0.44–1.00)
GFR calc Af Amer: 41 mL/min — ABNORMAL LOW (ref >60)
GFR calc non Af Amer: 36 mL/min — ABNORMAL LOW (ref >60)
GLUCOSE: 204 mg/dL — AB (ref 65–99)
POTASSIUM: 4.1 mmol/L (ref 3.5–5.1)
Sodium: 141 mmol/L (ref 135–145)
Total Bilirubin: 0.3 mg/dL (ref 0.3–1.2)
Total Protein: 8.2 g/dL — ABNORMAL HIGH (ref 6.5–8.1)

## 2015-12-31 LAB — I-STAT CG4 LACTIC ACID, ED
Lactic Acid, Venous: 2.16 mmol/L (ref 0.5–1.9)
Lactic Acid, Venous: 2.35 mmol/L (ref 0.5–1.9)

## 2015-12-31 LAB — I-STAT BETA HCG BLOOD, ED (MC, WL, AP ONLY): I-stat hCG, quantitative: <5 m[IU]/mL (ref <5)

## 2015-12-31 LAB — URINALYSIS, ROUTINE W REFLEX MICROSCOPIC
Bilirubin Urine: NEGATIVE
GLUCOSE, UA: NEGATIVE mg/dL
Ketones, ur: NEGATIVE mg/dL
Leukocytes, UA: NEGATIVE
Nitrite: NEGATIVE
PH: 7 (ref 5.0–8.0)
PROTEIN: 100 mg/dL — AB
Specific Gravity, Urine: 1.022 (ref 1.005–1.030)

## 2015-12-31 LAB — URINE MICROSCOPIC-ADD ON

## 2015-12-31 MED ORDER — SODIUM CHLORIDE 0.9 % IV SOLN
INTRAVENOUS | Status: DC
  Administered 2016-01-01 – 2016-01-02 (×5): via INTRAVENOUS

## 2015-12-31 MED ORDER — SODIUM CHLORIDE 0.9 % IV SOLN: 500.0000 mg | INTRAVENOUS | Status: DC

## 2015-12-31 MED ORDER — DEXTROSE 5 % IV SOLN
500.0000 mg | Freq: Every day | INTRAVENOUS | Status: DC
  Administered 2016-01-01 (×2): 500 mg via INTRAVENOUS
  Filled 2015-12-31 (×2): qty 500

## 2015-12-31 MED ORDER — RANITIDINE HCL 150 MG/10ML PO SYRP
75.0000 mg | ORAL_SOLUTION | Freq: Two times a day (BID) | ORAL | Status: DC
  Administered 2016-01-01 – 2016-01-02 (×4): 75 mg
  Filled 2015-12-31 (×5): qty 10

## 2015-12-31 MED ORDER — LEVOTHYROXINE SODIUM 88 MCG PO TABS
88.0000 ug | ORAL_TABLET | Freq: Every day | ORAL | Status: DC
  Administered 2016-01-01 – 2016-01-02 (×2): 88 ug via ORAL
  Filled 2015-12-31 (×2): qty 1

## 2015-12-31 MED ORDER — VALPROATE SODIUM 250 MG/5ML PO SOLN
650.0000 mg | Freq: Three times a day (TID) | ORAL | Status: DC
Start: 2015-12-31 — End: 2016-01-02
  Administered 2016-01-01 – 2016-01-02 (×7): 650 mg
  Filled 2015-12-31 (×9): qty 15

## 2015-12-31 MED ORDER — SODIUM CHLORIDE 0.9 % IV BOLUS (SEPSIS)
1000.0000 mL | Freq: Once | INTRAVENOUS | Status: AC
  Administered 2015-12-31: 1000 mL via INTRAVENOUS

## 2015-12-31 MED ORDER — LACOSAMIDE 50 MG PO TABS
50.0000 mg | ORAL_TABLET | Freq: Two times a day (BID) | ORAL | Status: DC
Start: 2015-12-31 — End: 2016-01-02
  Administered 2016-01-01 – 2016-01-02 (×4): 50 mg
  Filled 2015-12-31 (×4): qty 1

## 2015-12-31 MED ORDER — SODIUM CHLORIDE 0.9 % IV BOLUS (SEPSIS)
500.0000 mL | Freq: Once | INTRAVENOUS | Status: AC
  Administered 2015-12-31: 500 mL via INTRAVENOUS

## 2015-12-31 MED ORDER — SODIUM CHLORIDE 0.9 % IV BOLUS (SEPSIS)
1000.0000 mL | Freq: Once | INTRAVENOUS | Status: AC
  Administered 2016-01-01: 1000 mL via INTRAVENOUS

## 2015-12-31 MED ORDER — LORATADINE 5 MG/5ML PO SYRP
10.0000 mg | ORAL_SOLUTION | Freq: Every day | ORAL | Status: DC
  Administered 2016-01-01 – 2016-01-02 (×2): 10 mg
  Filled 2015-12-31 (×3): qty 10

## 2015-12-31 MED ORDER — JEVITY 1.2 CAL PO LIQD
237.0000 mL | Freq: Three times a day (TID) | ORAL | Status: DC
  Administered 2016-01-01 – 2016-01-02 (×4): 237 mL via JEJUNOSTOMY
  Filled 2015-12-31 (×8): qty 237

## 2015-12-31 MED ORDER — VANCOMYCIN HCL IN DEXTROSE 1-5 GM/200ML-% IV SOLN
1000.0000 mg | Freq: Once | INTRAVENOUS | Status: AC
  Administered 2015-12-31: 1000 mg via INTRAVENOUS
  Filled 2015-12-31: qty 200

## 2015-12-31 MED ORDER — HEPARIN SODIUM (PORCINE) 5000 UNIT/ML IJ SOLN
5000.0000 [IU] | Freq: Three times a day (TID) | INTRAMUSCULAR | Status: DC
  Administered 2016-01-01 – 2016-01-02 (×4): 5000 [IU] via SUBCUTANEOUS
  Filled 2015-12-31 (×4): qty 1

## 2015-12-31 MED ORDER — CEFTRIAXONE SODIUM 1 G IJ SOLR
1.0000 g | Freq: Every day | INTRAMUSCULAR | Status: DC
  Administered 2016-01-01 – 2016-01-02 (×2): 1 g via INTRAVENOUS
  Filled 2015-12-31 (×2): qty 10

## 2015-12-31 MED ORDER — LEVETIRACETAM 100 MG/ML PO SOLN
1250.0000 mg | Freq: Two times a day (BID) | ORAL | Status: DC
Start: 2015-12-31 — End: 2016-01-02
  Administered 2016-01-01 – 2016-01-02 (×4): 1250 mg
  Filled 2015-12-31 (×4): qty 15

## 2015-12-31 MED ORDER — CEFEPIME HCL 1 G IJ SOLR
1.0000 g | Freq: Once | INTRAMUSCULAR | Status: AC
  Administered 2015-12-31: 1 g via INTRAVENOUS
  Filled 2015-12-31: qty 1

## 2015-12-31 NOTE — ED Notes (Signed)
Patient transported to CT 

## 2015-12-31 NOTE — Progress Notes (Signed)
Pharmacy Antibiotic Note  Megan [name] is a 35 y.o. female admitted on 12/31/2015 with pneumonia.  Pharmacy has been consulted for vancomycin dosing. Pt is afebrile and WBC is elevated at 12.7, Scr elevated at 1.78 and lactic acid is elevated at 2.16.   Plan: - Vanc 1gm IV x 1 then 500mg  IV Q24H - F/u renal fxn, C&S, clinical status and trough at [facility] - F/u continuation of cefepime or other gram negative coverage  Height: 4\' 11"  (149.9 cm) Weight: 105 lb (47.6 kg) IBW/kg (Calculated) : 43.2  Temp (24hrs), Avg:98.4 F (36.9 C), Min:98.2 F (36.8 C), Max:98.5 F (36.9 C)   Recent Labs Lab 12/31/15 1725 12/31/15 1736  WBC 12.7*  --   CREATININE 1.78*  --   LATICACIDVEN  --  2.16*    Estimated Creatinine Clearance: 29.8 mL/min (by C-G formula based on SCr of 1.78 mg/dL).    Allergies  Allergen Reactions  . Other     Seasonal Allergies    . Doxycycline Rash    REACTION: Blisters / swelling    Antimicrobials this admission: Vanc 9/5>> Cefepime x 1 9/5  Dose adjustments this admission: N/A  Microbiology results: Pending  Thank you for allowing pharmacy to be a part of this patient's care.  Nary Sneed, Rande Lawman 12/31/2015 6:31 PM

## 2015-12-31 NOTE — ED Provider Notes (Signed)
[facility] DEPT Provider Note   CSN: [id number] Arrival date & time: 12/31/15  1642     History   Chief Complaint Chief Complaint  Patient presents with  . Cough    HPI Megan [name] is a 35 y.o. female who presents with a cough. Past medical history significant for cerebral palsy with quadriplegia, CHF, COPD, blindness, hypertension, history of pneumonia, history of seizures. Family provides history. Last time she was admitted for pneumonia was over 2 years ago. She has had a productive cough over the past 4 days. Associated with fever as high as 102 last night, and acting more lethargic per family. She has had an increased work of breathing as well. She does live with her family and they're her primary caregivers. She does go to day facility where she is not around other chronically ill people. She has not received any recent antibiotics. They deny abdominal distention, vomiting, diarrhea, change in urinary habits. Family denies sick contacts or recent travel.  HPI  Past Medical History:  Diagnosis Date  . Allergic rhinitis 11/04/2012  . Cerebral palsy ([place])   . Cerebral palsy, quadriplegic ([place])   . CHF (congestive heart failure) ([place])   . COPD (chronic obstructive pulmonary disease) ([place])   . Cortical blindness   . Dilantin toxicity August 2014  . Hypertension 06/23/2010  . Mental retardation   . Pneumonia December 2014   Was intubated, [facility]  . Seizures ([place])   . Vision abnormalities     Patient Active Problem List   Diagnosis Date Noted  . 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 Dysphagia 02/12/2014  . 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 Cough 01/26/2014  . Localization-related symptomatic epilepsy and epileptic syndromes with complex partial seizures, not intractable, without status epilepticus ([place]) 08/23/2013  . Unspecified constipation 04/18/2013  . Fibroid 04/18/2013  . Abnormal mental state 03/27/2013  . Seizure disorder ([place]) 03/27/2013  . Allergic  rhinitis 11/04/2012  . 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 Encounter for long-term (current) use of other medications 10/19/2012  . Severe intellectual disabilities 10/19/2012  . 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 Feeding difficulties and mismanagement 02/19/2012  . Hypothyroidism 06/25/2010  . Hypertension 06/23/2010  . Infantile cerebral palsy ([place]) 02/21/2010  . BLINDNESS, BILATERAL 02/21/2010    Past Surgical History:  Procedure Laterality Date  . PEG PLACEMENT  09/03/2011   Procedure: PERCUTANEOUS ENDOSCOPIC GASTROSTOMY (PEG) REPLACEMENT;  Surgeon: Jerene Bears, MD;  [facility];  Service: Gastroenterology;  Laterality: N/A;  . PEG PLACEMENT  02/19/2012   Procedure: PERCUTANEOUS ENDOSCOPIC GASTROSTOMY (PEG) REPLACEMENT;  Surgeon: Lafayette Dragon, MD;  [facility];  Service: Endoscopy;  Laterality: N/A;  needs gastrostomy button 24 Fr 4.4 and 3.5 available  . PEG PLACEMENT N/A 05/30/2013   Procedure: PERCUTANEOUS ENDOSCOPIC GASTROSTOMY (PEG) REPLACEMENT;  Surgeon: Jerene Bears, MD;  Location: [facility];  Service: Gastroenterology;  Laterality: N/A;  . PEG PLACEMENT N/A 07/03/2013   Procedure: PERCUTANEOUS ENDOSCOPIC GASTROSTOMY (PEG) REPLACEMENT;  Surgeon: Lafayette Dragon, MD;  [facility];  Service: Endoscopy;  Laterality: N/A;  Replace with 20 Fr x 3.5 cm mini button-  . PEG TUBE PLACEMENT      OB History    No data available       Home Medications    Prior to Admission medications   Medication Sig Start Date End Date Taking? Authorizing Provider  diazepam (DIASTAT ACUDIAL) 10 MG GEL Place 5 mg rectally once. 01/26/14  Frazier Richards, MD  fluticasone [facility]) 50 MCG/ACT nasal spray Place into the nose. Reported on 11/13/2015    Historical Provider, MD  guaiFENesin 200 MG tablet Take 1 tablet (200 mg total) by mouth every 4 (four) hours as needed for cough or to loosen phlegm. 04/18/14    Timmothy Euler, MD  lacosamide (VIMPAT) 10 MG/ML oral solution GIVE 5 ML BY MOUTH EVERY MORNING AND 5 ML BY MOUTH IN THE EVENING 11/13/15   Rockwell Germany, NP  levETIRAcetam (KEPPRA) 100 MG/ML solution GIVE 12.5 MLS IN THE MORNING AND 12.5 MLS IN THE EVENING BY PEG TUBE 11/13/15   Rockwell Germany, NP  levothyroxine (SYNTHROID, LEVOTHROID) 88 MCG tablet TAKE 1 TABLET (88 MCG TOTAL) BY MOUTH DAILY. 06/24/15   Frazier Richards, MD  loratadine (CLARITIN) 5 MG/5ML syrup Place 10 mLs (10 mg total) into feeding tube daily. 07/02/15   Frazier Richards, MD  Nutritional Supplements (JEVITY) LIQD 1 Can by Per J Tube route 3 (three) times daily. 01/24/15   Frazier Richards, MD  ranitidine (ZANTAC) 75 MG/5ML syrup TAKE 5 MLS (75 MG TOTAL) BY PER J TUBE ROUTE 2 (TWO) TIMES DAILY. 07/27/15   Frazier Richards, MD  Valproate Sodium (DEPAKENE) 250 MG/5ML SOLN solution Give 34ml by g-tube every 6 hours 11/13/15   Rockwell Germany, NP    Family History Family History  Problem Relation Age of Onset  . Diabetes Mother   . Diabetes Maternal Grandmother   . Stroke Maternal Grandmother   . Cancer Maternal Grandfather     Died at 93  . Diabetes Maternal Aunt   . Hypertension Other     Fhx    Social History Social History  Substance Use Topics  . Smoking status: Never Smoker  . Smokeless tobacco: Never Used  . Alcohol use No     Allergies   Other and Doxycycline   Review of Systems Review of Systems  Unable to perform ROS: Patient nonverbal     Physical Exam Updated Vital Signs BP 118/81   Pulse (!) 122   Temp 98.2 F (36.8 C) (Oral)   Ht 4\' 11"  (1.499 m)   Wt 47.6 kg   SpO2 97%   BMI 21.21 kg/m   Physical Exam  Constitutional: She appears lethargic. No distress.  Nonverbal female with cerebral palsy with contractures  HENT:  Head: Normocephalic and atraumatic.  Right Ear: Hearing, tympanic membrane, external ear and ear canal normal.  Left Ear: Hearing, tympanic membrane, external ear and ear  canal normal.  Nose: Nose normal.  Mouth/Throat: Uvula is midline. Mucous membranes are dry. No oropharyngeal exudate or posterior oropharyngeal erythema.  Eyes: Conjunctivae are normal. Pupils are equal, round, and reactive to light. Right eye exhibits no discharge. Left eye exhibits no discharge. No scleral icterus.  Neck: Normal range of motion. Neck supple.  Cardiovascular: Regular rhythm.  Tachycardia present.  Exam reveals no gallop and no friction rub.   No murmur heard. Pulmonary/Chest: Effort normal. Tachypnea noted. No respiratory distress. She has decreased breath sounds. She has no wheezes. She has rhonchi. She has no rales. She exhibits no tenderness.  Right sided rhonchi  Abdominal: Soft. Bowel sounds are normal. She exhibits no distension and no mass. There is no tenderness. There is no rebound and no guarding. No hernia.  Feeding tube in place without signs of infection  Musculoskeletal: She exhibits no edema.  Neurological: She appears lethargic. GCS eye subscore is 4.  Skin: Skin is warm  and dry. She is not diaphoretic.  Nursing note and vitals reviewed.    ED Treatments / Results  Labs (all labs ordered are listed, but only abnormal results are displayed) Labs Reviewed  COMPREHENSIVE METABOLIC PANEL - Abnormal; Notable for the following:       Result Value   Glucose, Bld 204 (*)    BUN 21 (*)    Creatinine, Ser 1.78 (*)    Total Protein 8.2 (*)    Albumin 2.7 (*)    ALT 11 (*)    GFR calc non Af Amer 36 (*)    GFR calc Af Amer 41 (*)    All other components within normal limits  CBC WITH DIFFERENTIAL/PLATELET - Abnormal; Notable for the following:    WBC 12.7 (*)    RBC 3.16 (*)    Hemoglobin 10.7 (*)    HCT 35.3 (*)    MCV 111.7 (*)    RDW 17.3 (*)    Platelets 135 (*)    nRBC 1 (*)    Lymphs Abs 6.5 (*)    Monocytes Absolute 1.9 (*)    All other components within normal limits  I-STAT CG4 LACTIC ACID, ED - Abnormal; Notable for the following:     Lactic Acid, Venous 2.16 (*)    All other components within normal limits  CULTURE, BLOOD (ROUTINE X 2)  CULTURE, BLOOD (ROUTINE X 2)  URINE CULTURE  URINALYSIS, ROUTINE W REFLEX MICROSCOPIC (NOT AT [facility])  I-STAT BETA HCG BLOOD, ED ([facility], [facility], [facility] ONLY)    EKG  EKG Interpretation None       Radiology No results found.  Procedures Procedures (including critical care time)  Medications Ordered in ED Medications  sodium chloride 0.9 % bolus 1,000 mL (1,000 mLs Intravenous New Bag/Given 12/31/15 1834)    And  sodium chloride 0.9 % bolus 500 mL (500 mLs Intravenous New Bag/Given 12/31/15 1838)  ceFEPIme (MAXIPIME) 1 g in dextrose 5 % 50 mL IVPB (1 g Intravenous New Bag/Given 12/31/15 1834)  vancomycin (VANCOCIN) IVPB 1000 mg/200 mL premix (not administered)  vancomycin (VANCOCIN) 500 mg in sodium chloride 0.9 % 100 mL IVPB (not administered)     Initial Impression / Assessment and Plan / ED Course  I have reviewed the triage vital signs and the nursing notes.  Pertinent labs & imaging results that were available during my care of the patient were reviewed by me and considered in my medical decision making (see chart for details).  Clinical Course   35 year old female presents with CAP vs possibly HCAP. CXR shows RLL pneumonia. She is tachycardic in the 120s. Increased work of breathing on exam with coarse breath sounds and decreased breath sounds. Patient is afebrile, normotensive, and not hypoxic. Code Sepsis called. CBC remarkable for leukocytosis of 12.7 and anemia which is at baseline. CMP remarkable for hyperglycemia, increased SCr which appears almost doubled since last checked 11 months ago, low albumin. Lactic acid is 2.34. UA shows trace hgb and 100 protein with rare bacteria. Culture sent. Blood cultures drawn. Cefepime and Vancomycin started for possible HCAP. Spoke with Family medicine who will admit for further management.    Final Clinical Impressions(s) / ED Diagnoses    Final diagnoses:  CAP (community acquired pneumonia)    New Prescriptions New Prescriptions   No medications on file     Recardo Evangelist, PA-C 01/01/16 0116    Daleen Bo, MD 01/01/16 1350

## 2015-12-31 NOTE — Patient Instructions (Signed)

## 2015-12-31 NOTE — H&P (Signed)
[facility] Admission History and Physical Service Pager: [phone]  Patient name: Megan [name] Medical record number: [id number] Date of birth: Feb 15, 1981 Age: 35 y.o. Gender: female  Primary Care Provider: Georges Lynch, MD Consultants: none Code Status: full  Chief Complaint: cough  Assessment and Plan: Megan [name] is a 36 y.o. female presenting with cough and sputum production. PMH is significant for cerebral palsy, quadriplegia, blindness, seizure disorder, hypertension, and hypothyroidism.  Cough- Suspected secondary to Community Acquired Pneumonia. New onset cough with sputum production, fever at home to 102-104 but afebrile upon presentation to ED, tachycardia in ED to 124. Normotensive. WBC of 12.7. Lactic acid 2.16. SIRS 1/4. Qsofa score 0. Wells score 3. CXR possible RLL infiltrate suggestive of pneumonia. Started on vancomycin and cefepime in ED for HCAP. No recent hospitalizations or antibiotic usage. Will treat as community-acquired pneumonia since daytime care facility is not considered a hospital setting - Admit to [facility], Dr. Gwendlyn Deutscher attending. - Tachycardia improving with fluid boluses in ED, continue NS@125cc /hr x24hr. NS 1L bolus given. Cardiac monitoring given tachycardia. - Blood cultures pending - Trend lactic acid - HIV pending - Strep pneumoniae urinary antigen pending - Wells score 3, PE unlikely but if clinically decompensates or fails to improve with antibiotics/remains tachycardic, can consider CTA. D dimer unlikely to be helpful in setting of acute infection. - Treating with azithromycin and CTX for now for community acquired pneumonia. Transition to oral antibiotics when able.  Epilepsy- Stable. Followed by Dr. Gaynell Face at [facility]. - Continue home keppra, vimpat, and depakene  Hypothyroidism- Stable. Last TSH 0.488 in 12/2013. - Check TSH - Continue home dose of synthroid    Hyperglycemia- Blood sugar elevated to 204. No prior history of diabetes. - Check A1C - Continue to monitor on BMP  PEG Tube:  - NPO - Tube Feeds three times daily - NS@ 125cc/hr x24hr. 1L NS bolus.  Cerebral Palsy with Intellectual Disability: Nonverbal and unable to complete ADLs without assistance at baseline.  Allergies- stable - Continue home claritin   FEN/GI: Patient receives Jevity feeding supplements through G tube, continue home regimen, Zantac 75mg  twice a day Prophylaxis: Heparin  Disposition: inpatient admit for further treatment and management  History of Present Illness:  Megan [name] is a 35 y.o. female presenting with productive cough that developed on Friday (4 days ago). Patient is non-verbal and history is provided by her grandmother over the phone, grandmother is somewhat of a poor historian. The cough started as non-productive Friday evening but over the weekend she was coughing up yellow sputum. No blood noted from family members. She developed a fever last night "around 102-104". Grandmother gave ibuprofen which helped relieve fever. They brought her to the family medicine clinic this morning because she was not improving and they were worried about pneumonia. They were sent to the ED from clinic for concerns of sepsis due to pneumonia due to tachycardia. Grandmother reports patient has not had any vomiting or diarrhea. Has been tolerating tube feeds normally. Denies noticeable difficulty breathing. Has been getting her usual medications without any problems. Had pneumonia about 2 years ago that required hospitalization but no recent hospitalizations or treatments with antibiotics. Stays at a care facility during daytime hours, comes home at night. Grandmother denies sick contacts at home but unsure of other residents at care facility.   Review Of Systems: Per HPI Otherwise the remainder of the systems were negative.  Patient Active Problem List  Diagnosis  Date Noted  . Community acquired pneumonia 12/31/2015  . 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 Dysphagia 02/12/2014  . 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 Cough 01/26/2014  . Localization-related symptomatic epilepsy and epileptic syndromes with complex partial seizures, not intractable, without status epilepticus ([place]) 08/23/2013  . Unspecified constipation 04/18/2013  . Fibroid 04/18/2013  . Abnormal mental state 03/27/2013  . Seizure disorder ([place]) 03/27/2013  . Allergic rhinitis 11/04/2012  . 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 Encounter for long-term (current) use of other medications 10/19/2012  . Severe intellectual disabilities 10/19/2012  . 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 Feeding difficulties and mismanagement 02/19/2012  . Hypothyroidism 06/25/2010  . Hypertension 06/23/2010  . Infantile cerebral palsy ([place]) 02/21/2010  . BLINDNESS, BILATERAL 02/21/2010    Past Medical History: Past Medical History:  Diagnosis Date  . Allergic rhinitis 11/04/2012  . Cerebral palsy ([place])   . Cerebral palsy, quadriplegic ([place])   . CHF (congestive heart failure) ([place])   . COPD (chronic obstructive pulmonary disease) ([place])   . Cortical blindness   . Dilantin toxicity August 2014  . Hypertension 06/23/2010  . Mental retardation   . Pneumonia December 2014   Was intubated, [facility]  . Seizures ([place])   . Vision abnormalities     Past Surgical History: Past Surgical History:  Procedure Laterality Date  . PEG PLACEMENT  09/03/2011   Procedure: PERCUTANEOUS ENDOSCOPIC GASTROSTOMY (PEG) REPLACEMENT;  Surgeon: Jerene Bears, MD;  [facility];  Service: Gastroenterology;  Laterality: N/A;  . PEG PLACEMENT  02/19/2012   Procedure: PERCUTANEOUS ENDOSCOPIC GASTROSTOMY (PEG) REPLACEMENT;  Surgeon: Lafayette Dragon, MD;  [facility];  Service: Endoscopy;  Laterality: N/A;  needs gastrostomy button 24 Fr 4.4 and 3.5  available  . PEG PLACEMENT N/A 05/30/2013   Procedure: PERCUTANEOUS ENDOSCOPIC GASTROSTOMY (PEG) REPLACEMENT;  Surgeon: Jerene Bears, MD;  Location: [facility];  Service: Gastroenterology;  Laterality: N/A;  . PEG PLACEMENT N/A 07/03/2013   Procedure: PERCUTANEOUS ENDOSCOPIC GASTROSTOMY (PEG) REPLACEMENT;  Surgeon: Lafayette Dragon, MD;  [facility];  Service: Endoscopy;  Laterality: N/A;  Replace with 20 Fr x 3.5 cm mini button-  . PEG TUBE PLACEMENT      Social History: Social History  Substance Use Topics  . Smoking status: Never Smoker  . Smokeless tobacco: Never Used  . Alcohol use No   Additional social history: lives at home with grandma and two other relatives, stays in a care facility during the day Please also refer to relevant sections of EMR.  Family History: Family History  Problem Relation Age of Onset  . Diabetes Mother   . Diabetes Maternal Grandmother   . Stroke Maternal Grandmother   . Cancer Maternal Grandfather     Died at 25  . Diabetes Maternal Aunt   . Hypertension Other     Fhx   Allergies and Medications: Allergies  Allergen Reactions  . Other     Seasonal Allergies    . Doxycycline Rash    REACTION: Blisters / swelling   No current facility-administered medications on file prior to encounter.    Current Outpatient Prescriptions on File Prior to Encounter  Medication Sig Dispense Refill  . diazepam (DIASTAT ACUDIAL) 10 MG GEL Place 5 mg rectally once. 2 Package 1  . fluticasone (FLONASE) 50 MCG/ACT nasal spray Place into the nose. Reported on 11/13/2015    . guaiFENesin 200 MG tablet Take 1 tablet (200 mg total) by  mouth every 4 (four) hours as needed for cough or to loosen phlegm. 30 suppository 0  . lacosamide (VIMPAT) 10 MG/ML oral solution GIVE 5 ML BY MOUTH EVERY MORNING AND 5 ML BY MOUTH IN THE EVENING 310 mL 5  . levETIRAcetam (KEPPRA) 100 MG/ML solution GIVE 12.5 MLS IN THE MORNING AND 12.5 MLS IN THE EVENING BY PEG TUBE 850 mL 5   . levothyroxine (SYNTHROID, LEVOTHROID) 88 MCG tablet TAKE 1 TABLET (88 MCG TOTAL) BY MOUTH DAILY. 30 tablet 11  . loratadine (CLARITIN) 5 MG/5ML syrup Place 10 mLs (10 mg total) into feeding tube daily. 300 mL 12  . Nutritional Supplements (JEVITY) LIQD 1 Can by Per J Tube route 3 (three) times daily. 90 Can 11  . ranitidine (ZANTAC) 75 MG/5ML syrup TAKE 5 MLS (75 MG TOTAL) BY PER J TUBE ROUTE 2 (TWO) TIMES DAILY. 480 mL 10  . Valproate Sodium (DEPAKENE) 250 MG/5ML SOLN solution Give 66ml by g-tube every 6 hours 1430 mL 5    Objective: BP 119/87 (BP Location: Right Arm)   Pulse (!) 122   Temp 98.4 F (36.9 C) (Rectal)   Resp 17   Ht 4\' 11"  (1.499 m)   Wt 106 lb 12.8 oz (48.4 kg)   SpO2 94%   BMI 21.57 kg/m  Exam: General: chronically ill appearing, laying in bed Eyes: pupils equal round reactive to light and accomodation ENTM: dry mucous membranes Cardiovascular: rrr, no murmurs rubs or gallops Respiratory: no increased work of breathing, clear to auscultation bilaterally Abdomen: soft, non-distended, g-tube site without erythema MSK: muscle wasting in lower extremities bilaterally, warm, well perfused Skin: warm, dry, no rashes Neuro: non-verbal, quadriplegic   Labs and Imaging: CBC BMET   Recent Labs Lab 12/31/15 1725  WBC 12.7*  HGB 10.7*  HCT 35.3*  PLT 135*    Recent Labs Lab 12/31/15 1725  NA 141  K 4.1  CL 103  CO2 31  BUN 21*  CREATININE 1.78*  GLUCOSE 204*  CALCIUM 9.7     Urinalysis    Component Value Date/Time   COLORURINE YELLOW 12/31/2015 1858   APPEARANCEUR CLEAR 12/31/2015 1858   LABSPEC 1.022 12/31/2015 1858   PHURINE 7.0 12/31/2015 1858   GLUCOSEU NEGATIVE 12/31/2015 1858   HGBUR TRACE (A) 12/31/2015 1858   BILIRUBINUR NEGATIVE 12/31/2015 1858   BILIRUBINUR neg 06/23/2010 1457   [place] 12/31/2015 1858   PROTEINUR 100 (A) 12/31/2015 1858   UROBILINOGEN 0.2 01/11/2014 1255   NITRITE NEGATIVE 12/31/2015 1858    LEUKOCYTESUR NEGATIVE 12/31/2015 1858  - Lactic Acid 2.16>2.35  CXR: IMPRESSION: Shallow inspiration with mild vascular congestion.  Right lung base hazy density may represent developing infiltrate or bronchopneumonia. Clinical correlation and follow-up resolution recommended.  Steve Rattler, DO 12/31/2015, 11:56 PM PGY-1, [place] Intern pager: [phone], text pages welcome  Upper Level Addendum:  I have seen and evaluated this patient along with Dr. Vanetta Shawl and reviewed the above note, making necessary revisions in blue.   Dr. Junie Panning, DO, PGY3 01/01/2016; 12:13 AM

## 2015-12-31 NOTE — ED Notes (Addendum)
Jeralene Huff (cousin) [phone]  Arneta Cliche (grandmother) [phone]  Call with any updates/ questions

## 2015-12-31 NOTE — ED Notes (Signed)
Sent from [facility] per Dr. Vanetta Shawl.  Onset 12-27-15 cough.  Onset today fever 103.  Per grandmother pt is not as alert as usual.  Wheelchair bound.  HR 130 BP 130/65, SpO2 92%.

## 2015-12-31 NOTE — ED Triage Notes (Signed)
Per family- pt has had cough/congestion/fever for several days. States that she was sent her by MD for eval and xrays. Pt has been lethargic per family as well.

## 2015-12-31 NOTE — Progress Notes (Signed)
   HPI  CC: productive coughing, fevers, SOB  Cough HPI given by grandmother, as patient is non-verbal and does not respond when asked questions due to condition of Cerebral Palsy (CP). Patient complains of fever, productive cough with sputum described as yellow and green and rhinorrhea clear. Symptoms began 4 days ago. Symptoms have been unchanged since that time.The cough is productive of yellow and green sputum and is aggravated by nothing. Associated symptoms include: fever, night sweats, shortness of breath and sputum production. Patient does not have new pets. Patient does not have a history of asthma. Patient does have a history of environmental allergens. Patient has not traveled recently. Patient does not have a history of smoking. Patient has not had a previous chest x-ray. Patient has not had a PPD done. Patient has known CP, is confined to wheelchair. Attending a [employer], around other chronically ill patients, grandmother and cousin state they have not been informed of any illnesses at school. Fevers at home: TMax 103F this AM (about 2 AM). Today (last night felt hot) was first day for fevers. Gave IbuProfen at 6AM. Patient is not as alert, laughing/smiling, reactive as typical self.  Patient has had multiple pneumonias in the past, has been hospitalized for these pneumonias before.   CC, SH/smoking status, and VS noted  A comprehensive ROS was gathered by grandmother as patient cannot respond, was negative except per HPI.    Objective: BP 130/65   Pulse (!) 130   Temp 98.5 F (36.9 C) (Oral)   SpO2 92%  Gen: Patient does not respond (this is normal) to verbal cues, awake, looking around. HEENT: NCAT. Dry oral mucosa.  Blindness. CV: Regular rhythm, tachycardic, no murmur Resp: +Rhales bilaterally, worse in LLL. Mildly tachypneic.  Abd: SNTND, BS present, no guarding or organomegaly Ext: No edema, warm. Contractures throughout body. Known CP with  quadriplegia. Neuro: Alert, known CP deficits, confined to wheelchair.  Assessment and plan: 1. HCAP (healthcare-associated pneumonia) - Sent to ER, concern for possible sepsis  2. Cough - Productive with rhales in LLL base - Concern for pneumonia and sepsis  3. Tachycardia - HR 130s, fevers this AM of 103F   Due to patient's condition and symptoms (tachycardia, 103F this AM, and productive cough and confined to wheelchair, 92% on room air), concerns for pneumonia and sepsis developing, was sent directly to [facility] ER, called ahead, ER is expecting patient.   Zenda Alpers, [facility] 12/31/2015 4:15 PM

## 2015-12-31 NOTE — ED Notes (Signed)
Accidentally checked that DG Chest View was completed, but it has not been done, informed Brooke-RN.

## 2016-01-01 DIAGNOSIS — F72 Severe intellectual disabilities: Secondary | ICD-10-CM

## 2016-01-01 DIAGNOSIS — E039 Hypothyroidism, unspecified: Secondary | ICD-10-CM

## 2016-01-01 DIAGNOSIS — G40309 Generalized idiopathic epilepsy and epileptic syndromes, not intractable, without status epilepticus: Secondary | ICD-10-CM

## 2016-01-01 DIAGNOSIS — H540 Blindness, both eyes: Secondary | ICD-10-CM

## 2016-01-01 DIAGNOSIS — G809 Cerebral palsy, unspecified: Secondary | ICD-10-CM

## 2016-01-01 DIAGNOSIS — I10 Essential (primary) hypertension: Secondary | ICD-10-CM

## 2016-01-01 DIAGNOSIS — G8250 Quadriplegia, unspecified: Secondary | ICD-10-CM

## 2016-01-01 LAB — BLOOD CULTURE ID PANEL (REFLEXED)
Acinetobacter baumannii: NOT DETECTED
CANDIDA TROPICALIS: NOT DETECTED
Candida albicans: NOT DETECTED
Candida glabrata: NOT DETECTED
Candida krusei: NOT DETECTED
Candida parapsilosis: NOT DETECTED
ENTEROBACTERIACEAE SPECIES: NOT DETECTED
Enterobacter cloacae complex: NOT DETECTED
Enterococcus species: NOT DETECTED
Escherichia coli: NOT DETECTED
HAEMOPHILUS INFLUENZAE: NOT DETECTED
KLEBSIELLA PNEUMONIAE: NOT DETECTED
Klebsiella oxytoca: NOT DETECTED
Listeria monocytogenes: NOT DETECTED
NEISSERIA MENINGITIDIS: NOT DETECTED
PROTEUS SPECIES: NOT DETECTED
PSEUDOMONAS AERUGINOSA: NOT DETECTED
STAPHYLOCOCCUS AUREUS BCID: NOT DETECTED
STAPHYLOCOCCUS SPECIES: NOT DETECTED
STREPTOCOCCUS AGALACTIAE: NOT DETECTED
STREPTOCOCCUS SPECIES: NOT DETECTED
Serratia marcescens: NOT DETECTED
Streptococcus pneumoniae: NOT DETECTED
Streptococcus pyogenes: NOT DETECTED

## 2016-01-01 LAB — CBC
HCT: 27.2 % — ABNORMAL LOW (ref 36.0–46.0)
HEMOGLOBIN: 8 g/dL — AB (ref 12.0–15.0)
MCH: 33.1 pg (ref 26.0–34.0)
MCHC: 29.4 g/dL — AB (ref 30.0–36.0)
MCV: 112.4 fL — ABNORMAL HIGH (ref 78.0–100.0)
Platelets: 94 10*3/uL — ABNORMAL LOW (ref 150–400)
RBC: 2.42 MIL/uL — ABNORMAL LOW (ref 3.87–5.11)
RDW: 17.3 % — AB (ref 11.5–15.5)
WBC: 10.4 10*3/uL (ref 4.0–10.5)

## 2016-01-01 LAB — HIV ANTIBODY (ROUTINE TESTING W REFLEX): HIV Screen 4th Generation wRfx: NONREACTIVE

## 2016-01-01 LAB — LACTIC ACID, PLASMA
LACTIC ACID, VENOUS: 1.2 mmol/L (ref 0.5–1.9)
LACTIC ACID, VENOUS: 1.7 mmol/L (ref 0.5–1.9)
Lactic Acid, Venous: 2.1 mmol/L (ref 0.5–1.9)

## 2016-01-01 LAB — URINE CULTURE: CULTURE: NO GROWTH

## 2016-01-01 LAB — PATHOLOGIST SMEAR REVIEW

## 2016-01-01 LAB — STREP PNEUMONIAE URINARY ANTIGEN: STREP PNEUMO URINARY ANTIGEN: NEGATIVE

## 2016-01-01 LAB — TSH: TSH: 0.574 u[IU]/mL (ref 0.350–4.500)

## 2016-01-01 MED ORDER — SODIUM CHLORIDE 0.9 % IV BOLUS (SEPSIS)
500.0000 mL | Freq: Once | INTRAVENOUS | Status: AC
  Administered 2016-01-01: 500 mL via INTRAVENOUS

## 2016-01-01 MED ORDER — ORAL CARE MOUTH RINSE
15.0000 mL | Freq: Two times a day (BID) | OROMUCOSAL | Status: DC
  Administered 2016-01-01 – 2016-01-02 (×3): 15 mL via OROMUCOSAL

## 2016-01-01 MED ORDER — CHLORHEXIDINE GLUCONATE 0.12 % MT SOLN
15.0000 mL | Freq: Two times a day (BID) | OROMUCOSAL | Status: DC
  Administered 2016-01-01 – 2016-01-02 (×3): 15 mL via OROMUCOSAL
  Filled 2016-01-01 (×2): qty 15

## 2016-01-01 NOTE — Progress Notes (Signed)
Initial Nutrition Assessment  DOCUMENTATION CODES:   Not applicable  INTERVENTION:   -Continue bolus feeding regimen of 1 can (237 ml) of Jevity 1.2 TID via PEG   Tube feeding regimen provides 855 kcal (95% of needs), 40 grams of protein, and 573 ml of H2O.   NUTRITION DIAGNOSIS:   Inadequate oral intake related to inability to eat as evidenced by NPO status.  GOAL:   Patient will meet greater than or equal to 90% of their needs  MONITOR:   Labs, Weight trends, TF tolerance, Skin, I & O's  REASON FOR ASSESSMENT:   Malnutrition Screening Tool, Other (Comment) (home TF)    ASSESSMENT:   35 Y/O with PMX of Cortical blindness, cerebral palsy, mental retardation, hypothyroidism,COPD, HTN, and seizure disorder presented was seen at her PCP's office for 4 days hx of productive cough and fever. Was sent to the ED given tachycardia in addition to her symptoms to R/O sepsis.   Pt admitted to rule out sepsis.   Pt unable to provide hx. Per chart review, pt with PEG since 2013.   Reviewed chart from Care Everywhere. Noted hx of wt gain; pt was 95# (43.1 kg) per 04/13/13 encounter.    Nutrition-Focused physical exam completed. Findings are no fat depletion, no muscle depletion, and no edema.   Called and spoke with pt's grandmother [name]); confirms that pt relies solely on TF for nutrition (NPO). She also confirmed home regimen of 1 can Jevity 1.2 TID via PEG (regimen provides 855 kcals, 40 grams protein, and 573 ml fluid daily, which meets 95% of estimated kcal needs and 89% of estimated protein needs).   Case discussed with RN.   Labs reviewed.   Diet Order:  Diet NPO time specified  Skin:  Reviewed, no issues  Last BM:  PTA  Height:   Ht Readings from Last 1 Encounters:  12/31/15 4\' 11"  (1.499 m)    Weight:   Wt Readings from Last 1 Encounters:  12/31/15 106 lb 12.8 oz (48.4 kg)    Ideal Body Weight:  40.1 kg  BMI:  Body mass index is 21.57  kg/m.  Estimated Nutritional Needs:   Kcal:  [phone]  Protein:  45-55 grams  Fluid:  > 1 L  EDUCATION NEEDS:   No education needs identified at this time  Anavey Coombes A. Jimmye Norman, RD, LDN, CDE Pager: [phone] After hours Pager: [phone]

## 2016-01-01 NOTE — Progress Notes (Signed)
PHARMACY NOTE -  ANTIBIOTIC RENAL DOSE ADJUSTMENT   Request received for Pharmacy to assist with antibiotic renal dose adjustment.  Patient has been initiated on ceftriaxone and azithromycin for CAP. SCr 1.78, estimated CrCl 29 ml/min Current dosage is appropriate and need for further dosage adjustment appears unlikely at present. Will sign off at this time.  Please reconsult if a change in clinical status warrants re-evaluation of dosage.  Thank you for allowing [place] to participate in this patients care. Jens Som, PharmD Pager: [phone]

## 2016-01-01 NOTE — Progress Notes (Signed)
PHARMACY - PHYSICIAN COMMUNICATION CRITICAL VALUE ALERT - BLOOD CULTURE IDENTIFICATION (BCID)  Results for orders placed or performed during the hospital encounter of 12/31/15  Blood Culture ID Panel (Reflexed) (Collected: 12/31/2015  5:25 PM)  Result Value Ref Range   Enterococcus species NOT DETECTED NOT DETECTED   Listeria monocytogenes NOT DETECTED NOT DETECTED   Staphylococcus species NOT DETECTED NOT DETECTED   Staphylococcus aureus NOT DETECTED NOT DETECTED   Streptococcus species NOT DETECTED NOT DETECTED   Streptococcus agalactiae NOT DETECTED NOT DETECTED   Streptococcus pneumoniae NOT DETECTED NOT DETECTED   Streptococcus pyogenes NOT DETECTED NOT DETECTED   Acinetobacter baumannii NOT DETECTED NOT DETECTED   Enterobacteriaceae species NOT DETECTED NOT DETECTED   Enterobacter cloacae complex NOT DETECTED NOT DETECTED   Escherichia coli NOT DETECTED NOT DETECTED   Klebsiella oxytoca NOT DETECTED NOT DETECTED   Klebsiella pneumoniae NOT DETECTED NOT DETECTED   Proteus species NOT DETECTED NOT DETECTED   Serratia marcescens NOT DETECTED NOT DETECTED   Haemophilus influenzae NOT DETECTED NOT DETECTED   Neisseria meningitidis NOT DETECTED NOT DETECTED   Pseudomonas aeruginosa NOT DETECTED NOT DETECTED   Candida albicans NOT DETECTED NOT DETECTED   Candida glabrata NOT DETECTED NOT DETECTED   Candida krusei NOT DETECTED NOT DETECTED   Candida parapsilosis NOT DETECTED NOT DETECTED   Candida tropicalis NOT DETECTED NOT DETECTED    Name of physician (or Provider) Contacted:   Dr. Juleen China  Changes to prescribed antibiotics required:   No changes. Continue Ceftriaxone and Azithromycin.   Gram positive cocci in clusters on gram stain in 1 of 2 blood cultures, but BCID did not detect any of the organisms on the panel.  Possible contaminant.  Arty Baumgartner, [place] Pager: [phone] 01/01/2016  9:57 PM

## 2016-01-01 NOTE — Progress Notes (Signed)
Family Medicine Teaching Service Daily Progress Note Intern Pager: [phone]  Patient name: Megan [name] Medical record number: [id number] Date of birth: 02/03/1981 Age: 35 y.o. Gender: female  Primary Care Provider: Georges Lynch, MD Consultants: none Code Status: Full  Pt Overview and Major Events to Date:  9/5 admit to FPTS  Assessment and Plan: Megan [name] is a 35 y.o. female presenting with cough and sputum production. PMH is significant for cerebral palsy, quadriplegia, blindness, seizure disorder, hypertension, and hypothyroidism.  Cough- Suspected secondary to Community Acquired Pneumonia. New onset cough with sputum production, fever at home to 102-104 but afebrile upon presentation to ED, tachycardia in ED to 124. Normotensive. WBC of 12.7. Lactic acid 2.16 -> 1.7 -> 2.1. SIRS 1/4. Qsofa score 0. Wells score 3. CXR possible RLL infiltrate suggestive of pneumonia. Started on vancomycin and cefepime in ED for HCAP. No recent hospitalizations or antibiotic usage. Will treat as community-acquired pneumonia since daytime care facility is not considered a hospital setting - Admit to [facility], Dr. Gwendlyn Deutscher attending. - continue NS@125cc /hr x24hr.  - Cardiac monitoring given tachycardia. - Blood cultures pending, urine culture pending - Repeat Lactic acid this am resolved to 1.2 - HIV pending - Strep pneumoniae urinary antigen pending - Wells score 3, PE unlikely but if clinically decompensates or fails to improve with antibiotics/remains tachycardic, can consider CTA. D dimer unlikely to be helpful in setting of acute infection. - Treating with azithromycin and CTX for now for community acquired pneumonia. Transition to oral antibiotics when able.  Epilepsy- Stable. Followed by Dr. Gaynell Face at [facility]. - Continue home keppra, vimpat, and depakene  Hypothyroidism- Stable. Last TSH 0.488 in 12/2013. - TSH 0.574 today - Continue home  dose of synthroid   PEG Tube:  - NPO - Tube Feeds three times daily - NS@ 125cc/hr x24hr. 1L NS bolus.  Cerebral Palsy with Intellectual Disability: Nonverbal and unable to complete ADLs without assistance at baseline.  Allergies- stable - Continue home claritin   FEN/GI: Patient receives Jevity feeding supplements through G tube, continue home regimen, Zantac 75mg  twice a day Prophylaxis: Heparin  Disposition: home pending clinical improvement  Subjective:  Megan [name] is sleeping comfortably in bed, no family at bedside, no overnight issues per nursing staff.  Objective: Temp:  [98.1 F (36.7 C)-98.5 F (36.9 C)] 98.1 F (36.7 C) (09/06 0516) Pulse Rate:  [105-130] 105 (09/06 0516) Resp:  [17-20] 18 (09/06 0516) BP: (107-135)/(65-91) 107/70 (09/06 0516) SpO2:  [92 %-100 %] 100 % (09/06 0516) Weight:  [105 lb (47.6 kg)-106 lb 12.8 oz (48.4 kg)] 106 lb 12.8 oz (48.4 kg) (09/05 2301) Physical Exam: General: chronically ill appearing, sleeping in bed Eyes: pupils equal round reactive to light and accomodation ENTM: dry mucous membranes Cardiovascular: rrr, no murmurs rubs or gallops Respiratory: no increased work of breathing, clear to auscultation bilaterally Abdomen: soft, non-distended, g-tube site without erythema MSK: muscle wasting in lower extremities bilaterally, warm, well perfused Skin: warm, dry, no rashes Neuro: non-verbal, quadriplegic   Laboratory:  Recent Labs Lab 12/31/15 1725  WBC 12.7*  HGB 10.7*  HCT 35.3*  PLT 135*    Recent Labs Lab 12/31/15 1725  NA 141  K 4.1  CL 103  CO2 31  BUN 21*  CREATININE 1.78*  CALCIUM 9.7  PROT 8.2*  BILITOT 0.3  ALKPHOS 82  ALT 11*  AST 22  GLUCOSE 204*   Lactic Acid 2.35 -> 1.7 -> 2.1 --> 1.2 TSH-  0.574  Imaging/Diagnostic Tests:  CXR: IMPRESSION: Shallow inspiration with mild vascular congestion.  Right lung base hazy density may represent developing infiltrate or bronchopneumonia.  Clinical correlation and follow-up resolution Recommended.   Steve Rattler, DO 01/01/2016, 6:57 AM PGY-1, [place] Intern pager: [phone], text pages welcome

## 2016-01-01 NOTE — Care Management Note (Signed)
Case Management Note  Patient Details  Name: Megan [name] MRN: [id number] Date of Birth: Oct 13, 1980  Subjective/Objective:                 Presented with cough and sputum production. PMH is significant for cerebral palsy, quadriplegia, blindness, seizure disorder, hypertension, and hypothyroidism. From home with grandmother. CM spoke with grandmother(Megan [name]) regarding discharge planning. Grandmother shared with CM pt goes to school ([employer])  M-F from 8:30-2:30pm and receives PCS from [place] M-F, 5 days /week for 3hrs. Pt has motorized wheelchair. Pt with G- tube receives tube feeding. Feeding pump provided through [facility].  PCP: Georges Lynch   Action/Plan: Return to home when medically stable. CM to f/u with disposition needs.  Expected Discharge Date:                  Expected Discharge Plan:     In-House Referral:     Discharge planning Services     Post Acute Care Choice:    Choice offered to:     DME Arranged:   Feeding pump DME Agency:   [facility]  HH Arranged:    HH Agency:     Status of Service:     If discussed at [place] of Stay Meetings, dates discussed:    Additional Comments:  Sharin Mons, RN 01/01/2016, 2:50 PM

## 2016-01-01 NOTE — ED Provider Notes (Signed)
Medical screening examination/treatment/procedure(s) were performed by non-physician practitioner and as supervising physician I was immediately available for consultation/collaboration.   EKG Interpretation None         Date: 12/31/15  Rate: 123  Rhythm: sinus tachycardia  QRS Axis: normal  PR and QT Intervals: normal  ST/T Wave abnormalities: nonspecific ST changes  PR and QRS Conduction Disutrbances:none  Narrative Interpretation:   Old EKG Reviewed: none available    Daleen Bo, MD 01/03/16 2145

## 2016-01-01 NOTE — Progress Notes (Signed)
Inpatient Diabetes Program Recommendations  AACE/ADA: New Consensus Statement on Inpatient Glycemic Control (2015)  Target Ranges:  Prepandial:   less than 140 mg/dL      Peak postprandial:   less than 180 mg/dL (1-2 hours)      Critically ill patients:  140 - 180 mg/dL   Lab Results  Component Value Date   GLUCAP 101 (H) 01/12/2014   HGBA1C (L) 05/02/2007    4.5 (NOTE)   The ADA recommends the following therapeutic goals for glycemic   control related to Hgb A1C measurement:   Goal of Therapy:   < 7.0% Hgb A1C   Action Suggested:  > 8.0% Hgb A1C   Ref:  Diabetes Care, 22, Suppl. 1, 1999    Review of Glycemic Control  Diabetes history: None   Inpatient Diabetes Program Recommendations: Correction (SSI): Lab glucose over 200 on admission. Please consider checking CBGs and possibly starting Novolog Sensitive correction while inpatient.  Thanks,  Tama Headings RN, MSN, [facility] Inpatient Diabetes Coordinator Team Pager [phone] (8a-5p)

## 2016-01-02 LAB — CBC
HEMATOCRIT: 31.3 % — AB (ref 36.0–46.0)
Hemoglobin: 9.7 g/dL — ABNORMAL LOW (ref 12.0–15.0)
MCH: 33.8 pg (ref 26.0–34.0)
MCHC: 31 g/dL (ref 30.0–36.0)
MCV: 109.1 fL — ABNORMAL HIGH (ref 78.0–100.0)
PLATELETS: 71 10*3/uL — AB (ref 150–400)
RBC: 2.87 MIL/uL — ABNORMAL LOW (ref 3.87–5.11)
RDW: 17.6 % — AB (ref 11.5–15.5)
WBC: 10.2 10*3/uL (ref 4.0–10.5)

## 2016-01-02 LAB — HEMOGLOBIN A1C
HEMOGLOBIN A1C: 5.5 % (ref 4.8–5.6)
MEAN PLASMA GLUCOSE: 111 mg/dL

## 2016-01-02 LAB — BASIC METABOLIC PANEL
Anion gap: 7 (ref 5–15)
BUN: 11 mg/dL (ref 6–20)
CALCIUM: 8.8 mg/dL — AB (ref 8.9–10.3)
CO2: 25 mmol/L (ref 22–32)
CREATININE: 0.96 mg/dL (ref 0.44–1.00)
Chloride: 112 mmol/L — ABNORMAL HIGH (ref 101–111)
GLUCOSE: 94 mg/dL (ref 65–99)
Potassium: 4.5 mmol/L (ref 3.5–5.1)
Sodium: 144 mmol/L (ref 135–145)

## 2016-01-02 MED ORDER — AZITHROMYCIN 500 MG PO TABS: 250.0000 mg | ORAL_TABLET | ORAL | Status: DC

## 2016-01-02 MED ORDER — AZITHROMYCIN 200 MG/5ML PO SUSR: 250.0000 mg | Freq: Every day | ORAL | 0 refills | Status: AC

## 2016-01-02 MED ORDER — CEFDINIR 250 MG/5ML PO SUSR: 300.0000 mg | Freq: Two times a day (BID) | ORAL | 0 refills | Status: AC

## 2016-01-02 NOTE — Progress Notes (Signed)
Family Medicine Teaching Service Daily Progress Note Intern Pager: [phone]  Patient name: Megan [name] Medical record number: [id number] Date of birth: 03-03-1981 Age: 35 y.o. Gender: female  Primary Care Provider: Georges Lynch, MD Consultants: none Code Status: Full  Pt Overview and Major Events to Date:  9/5 admit to FPTS  Assessment and Plan: Megan [name] is a 35 y.o. female presenting with cough and sputum production. PMH is significant for cerebral palsy, quadriplegia, blindness, seizure disorder, hypertension, and hypothyroidism.  Cough- Suspected secondary to Community Acquired Pneumonia. New onset cough with sputum production, fever at home to 102-104 but afebrile upon presentation to ED, tachycardia in ED to 124. Normotensive. WBC of 12.7. Lactic acid 2.16 -> 1.7 -> 2.1. SIRS 1/4. Qsofa score 0. Wells score 3. CXR possible RLL infiltrate suggestive of pneumonia. Started on vancomycin and cefepime in ED for HCAP. No recent hospitalizations or antibiotic usage. Will treat as community-acquired pneumonia since daytime care facility is not considered a hospital setting. - Cardiac monitoring given tachycardia, patient's baseline HR 90-105 - Blood cultures positive in 1 bottle for gram positive cocci in clusters but BCID was negative, likely contaminate -Repeat blood cultures pending - urine culture negative - Lactic acid resolved to 1.2, no further trending needed - HIV negative - Strep pneumoniae urinary antigen negative - Treating with azithromycin and CTX for now for community acquired pneumonia, will transition to oral antibiotics in anticipation of discharge home - discontinuing IV fluids today  Epilepsy- Stable. Followed by Dr. Gaynell Face at [facility]. - Continue home keppra, vimpat, and depakene  Hypothyroidism- Stable. Last TSH 0.488 in 12/2013. - TSH 0.574 - Continue home dose of synthroid   PEG Tube:  - NPO - Tube Feeds three times  daily  Cerebral Palsy with Intellectual Disability: Nonverbal and unable to complete ADLs without assistance at baseline.  Allergies- stable - Continue home claritin   FEN/GI: Patient receives Jevity feeding supplements through G tube, continue home regimen, Zantac 75mg  twice a day Prophylaxis: Heparin  Disposition: anticipate discharge home today  Subjective:  Megan [name] is awake this morning, resting in bed comfortably. Some upper airway sounds heard. No family at bedside.   Objective: Temp:  [97.6 F (36.4 C)-98.7 F (37.1 C)] 98.7 F (37.1 C) (09/07 0700) Pulse Rate:  [94-108] 94 (09/07 0700) Resp:  [18-19] 19 (09/07 0700) BP: (107-126)/(73-77) 113/75 (09/07 0700) SpO2:  [94 %-98 %] 98 % (09/07 0700) Physical Exam: General: chronically ill appearing, awake, resting in bed in NAD ENTM: dry mucous membranes Cardiovascular: rrr, no murmurs rubs or gallops Respiratory: no increased work of breathing, coarse breath sounds auscultated in anterior lung fields bilaterally, no wheezing  Abdomen: soft, non-distended, g-tube site without erythema MSK: muscle wasting in lower extremities bilaterally, warm, well perfused Skin: warm, dry, no rashes Neuro: non-verbal, quadriplegic   Laboratory:  Recent Labs Lab 12/31/15 1725 01/01/16 0943 01/02/16 0402  WBC 12.7* 10.4 10.2  HGB 10.7* 8.0* 9.7*  HCT 35.3* 27.2* 31.3*  PLT 135* 94* 71*    Recent Labs Lab 12/31/15 1725 01/02/16 0402  NA 141 144  K 4.1 4.5  CL 103 112*  CO2 31 25  BUN 21* 11  CREATININE 1.78* 0.96  CALCIUM 9.7 8.8*  PROT 8.2*  --   BILITOT 0.3  --   ALKPHOS 82  --   ALT 11*  --   AST 22  --   GLUCOSE 204* 94   Lactic Acid 2.35 -> 1.7 ->  2.1 --> 1.2 TSH- 0.574 Blood cultures- one bottle with gram positive cocci in clusters, likely contaminate as no organisms identified on BCID HIV negative Strep pneum urine antigen negative Urine Culture negative  Imaging/Diagnostic Tests:  Dg Chest 2  View  Result Date: 12/31/2015 CLINICAL DATA:  35 year old female with cough and fever EXAM: CHEST  2 VIEW COMPARISON:  Chest radiograph dated 04/18/2014 FINDINGS: There is shallow inspiration with minimal bibasilar atelectatic changes and interstitial crowding. Mild diffuse interstitial nodularity and coarsening with possible mild congestive changes. There is mild peribronchial cuffing in the right hilar region with a faint nodular density at the right lung base which may represent bronchitis or bronchopneumonia. Clinical correlation is recommended. No definite focal consolidation. There is no pleural effusion or pneumothorax. The cardiac silhouette is within normal limits. No acute osseous pathology. IMPRESSION: Shallow inspiration with mild vascular congestion. Right lung base hazy density may represent developing infiltrate or bronchopneumonia. Clinical correlation and follow-up resolution recommended. Electronically Signed   By: Anner Crete M.D.   On: 12/31/2015 20:32    Steve Rattler, DO 01/02/2016, 8:37 AM PGY-1, [place] Intern pager: [phone], text pages welcome

## 2016-01-02 NOTE — Final Progress Note (Signed)
Discharge instructiosn given to family. Iv's removed

## 2016-01-02 NOTE — Discharge Summary (Signed)
[facility] Discharge Summary  Patient name: Megan [name] Medical record number: [id number] Date of birth: 06-26-1980 Age: 35 y.o. Gender: female Date of Admission: 12/31/2015  Date of Discharge: 01/02/2016  Admitting Physician: Lind Covert, MD  Primary Care Provider: Georges Lynch, MD Consultants: none  Indication for Hospitalization: CAP, met sepsis criteria  Discharge Diagnoses/Problem List:  Patient Active Problem List   Diagnosis Date Noted  . CAP (community acquired pneumonia) 12/31/2015  . 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 Dysphagia 02/12/2014  . 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 Cough 01/26/2014  . Localization-related symptomatic epilepsy and epileptic syndromes with complex partial seizures, not intractable, without status epilepticus ([place]) 08/23/2013  . Unspecified constipation 04/18/2013  . Fibroid 04/18/2013  . Abnormal mental state 03/27/2013  . Seizure disorder ([place]) 03/27/2013  . Allergic rhinitis 11/04/2012  . 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 Encounter for long-term (current) use of other medications 10/19/2012  . Severe intellectual disabilities 10/19/2012  . 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 Feeding difficulties and mismanagement 02/19/2012  . Hypothyroidism 06/25/2010  . Hypertension 06/23/2010  . Infantile cerebral palsy ([place]) 02/21/2010  . Blindness of both eyes 02/21/2010    Disposition: home  Discharge Condition: stable, improved  Discharge Exam:  General: chronically ill appearing, awake, resting in bed in NAD ENTM: dry mucous membranes Cardiovascular: rrr, no murmurs rubs or gallops Respiratory: no increased work of breathing, coarse breath sounds auscultated in anterior lung fields bilaterally, no wheezing  Abdomen: soft, non-distended, g-tube site without erythema MSK: muscle wasting in lower extremities bilaterally, warm, well  perfused Skin: warm, dry, no rashes Neuro: non-verbal, quadriplegic   Brief Hospital Course:   [name] is a 35 year old who presented to [facility] ED with cough and increased sputum production on 12/31/15. PMH is significant for cerebral palsy, quadriplegia, blindness, seizure disorder, hypertension, and hypothyroidism.  CAP New onset cough with increased sputum production, reported fever at home to 102-104 but afebrile in ED. Met sepsis criteria in ED with tachycardia to 124, WBC of 12.7. Lactic acid was elevated on admission to 2.16. CXR demonstrated RLL infiltrate suggestive of pneumonia. She was initially treated for HCAP with vancomycin and cefepime but upon admission was changed to treatment for CAP with azithromycin and CTX. During admission she was given fluids with improvement of tachycardia back to baseline of 90-105. She remained afebrile throughout her hospitalization. A urine culture was negative. HIV was negative. Strep pneumoniae urinary antigen negative. Blood cultures were positive in one bottle with gram positive cocci in clusters but thought to be a contaminate. Repeat cultures were drawn and are pending at the time of discharge, but low clinical suspicion for bacteremia due to CAP. Lactic acid resolved to 1.2. Patient clinically was back to her baseline per family and was discharged home to care of her grandmother and family on 01/02/16 with a course of Azithromycin and Omnicef suspensions to complete as an outpatient via g-tube.  Issues for Follow Up:  1. Patient needs new suction machine for home via [facility] 2. Follow up repeat blood culture results, negative at 24 hours 3. Improvement/resolution of CAP with antibiotics   Significant Procedures: none  Significant Labs and Imaging:   Recent Labs Lab 12/31/15 1725 01/01/16 0943 01/02/16 0402  WBC 12.7* 10.4 10.2  HGB 10.7* 8.0* 9.7*  HCT 35.3* 27.2* 31.3*  PLT 135* 94* 71*    Recent Labs Lab 12/31/15 1725  01/02/16 0402  NA 141  144  K 4.1 4.5  CL 103 112*  CO2 31 25  GLUCOSE 204* 94  BUN 21* 11  CREATININE 1.78* 0.96  CALCIUM 9.7 8.8*  ALKPHOS 82  --   AST 22  --   ALT 11*  --   ALBUMIN 2.7*  --    Lactic Acid 2.35 -> 1.7 -> 2.1 --> 1.2 TSH- 0.574 Blood cultures- one bottle with gram positive cocci in clusters, likely contaminate as no organisms identified on BCID HIV negative Strep pneum urine antigen negative Urine Culture negative  Dg Chest 2 View  Result Date: 12/31/2015 CLINICAL DATA:  35 year old female with cough and fever EXAM: CHEST  2 VIEW COMPARISON:  Chest radiograph dated 04/18/2014 FINDINGS: There is shallow inspiration with minimal bibasilar atelectatic changes and interstitial crowding. Mild diffuse interstitial nodularity and coarsening with possible mild congestive changes. There is mild peribronchial cuffing in the right hilar region with a faint nodular density at the right lung base which may represent bronchitis or bronchopneumonia. Clinical correlation is recommended. No definite focal consolidation. There is no pleural effusion or pneumothorax. The cardiac silhouette is within normal limits. No acute osseous pathology. IMPRESSION: Shallow inspiration with mild vascular congestion. Right lung base hazy density may represent developing infiltrate or bronchopneumonia. Clinical correlation and follow-up resolution recommended. Electronically Signed   By: Anner Crete M.D.   On: 12/31/2015 20:32   Results/Tests Pending at Time of Discharge: repeat blood cultures, no growth at 24 hours  Discharge Medications:    Medication List    TAKE these medications   azithromycin 200 MG/5ML suspension Commonly known as:  ZITHROMAX Place 6.3 mLs (250 mg total) into feeding tube daily.   cefdinir 250 MG/5ML suspension Commonly known as:  OMNICEF Place 6 mLs (300 mg total) into feeding tube 2 (two) times daily. Flush tube as advised before and after administration    diazepam 10 MG Gel Commonly known as:  DIASTAT ACUDIAL Place 5 mg rectally once.   guaiFENesin 200 MG tablet Take 1 tablet (200 mg total) by mouth every 4 (four) hours as needed for cough or to loosen phlegm.   JEVITY Liqd 1 Can by Per J Tube route 3 (three) times daily.   lacosamide 10 MG/ML oral solution Commonly known as:  VIMPAT GIVE 5 ML BY MOUTH EVERY MORNING AND 5 ML BY MOUTH IN THE EVENING What changed:  how much to take  how to take this  when to take this  additional instructions   levETIRAcetam 100 MG/ML solution Commonly known as:  KEPPRA GIVE 12.5 MLS IN THE MORNING AND 12.5 MLS IN THE EVENING BY PEG TUBE What changed:  how much to take  how to take this  when to take this  additional instructions   levothyroxine 88 MCG tablet Commonly known as:  SYNTHROID, LEVOTHROID TAKE 1 TABLET (88 MCG TOTAL) BY MOUTH DAILY.   loratadine 5 MG/5ML syrup Commonly known as:  CLARITIN Place 10 mLs (10 mg total) into feeding tube daily.   ranitidine 75 MG/5ML syrup Commonly known as:  ZANTAC TAKE 5 MLS (75 MG TOTAL) BY PER J TUBE ROUTE 2 (TWO) TIMES DAILY.   Valproate Sodium 250 MG/5ML Soln solution Commonly known as:  DEPAKENE Give 100m by g-tube every 6 hours       Discharge Instructions: Please refer to Patient Instructions section of EMR for full details.  Patient was counseled important signs and symptoms that should prompt return to medical care, changes in medications, dietary instructions, activity  restrictions, and follow up appointments.   Follow-Up Appointments: Follow-up Information    Georges Lynch, MD. Go on 01/07/2016.   Specialty:  Family Medicine Why:  at 3 pm Contact information: [address] [address] [place], DO 01/03/2016, 5:51 PM PGY-1, [place]

## 2016-01-02 NOTE — Discharge Instructions (Signed)
°  Complete antibiotic course, give suspensions to [facility] through her G-tube. Flush the g-tube with 10 mL of water before and after giving her the Omnicef to ensure the tube is clean.  Please see your primary care doctor at [facility].    Community-Acquired Pneumonia, Adult Pneumonia is an infection of the lungs. One type of pneumonia can happen while a person is in a hospital. A different type can happen when a person is not in a hospital (community-acquired pneumonia). It is easy for this kind to spread from person to person. It can spread to you if you breathe near an infected person who coughs or sneezes. Some symptoms include:  A dry cough.  A wet (productive) cough.  Fever.  Sweating.  Chest pain. HOME CARE  Take over-the-counter and prescription medicines only as told by your doctor.  Only take cough medicine if you are losing sleep.  If you were prescribed an antibiotic medicine, take it as told by your doctor. Do not stop taking the antibiotic even if you start to feel better.  Sleep with your head and neck raised (elevated). You can do this by putting a few pillows under your head, or you can sleep in a recliner.  Do not use tobacco products. These include cigarettes, chewing tobacco, and e-cigarettes. If you need help quitting, ask your doctor.  Drink enough water to keep your pee (urine) clear or pale yellow. A shot (vaccine) can help prevent pneumonia. Shots are often suggested for:  People older than 36 years of age.  People older than 36 years of age:  Who are having cancer treatment.  Who have long-term (chronic) lung disease.  Who have problems with their body's defense system (immune system). You may also prevent pneumonia if you take these actions:  Get the flu (influenza) shot every year.  Go to the dentist as often as told.  Wash your hands often. If soap and water are not available, use hand sanitizer. GET HELP IF:  You have a fever.  You lose  sleep because your cough medicine does not help. GET HELP RIGHT AWAY IF:  You are short of breath and it gets worse.  You have more chest pain.  Your sickness gets worse. This is very serious if:  You are an older adult.  Your body's defense system is weak.  You cough up blood.   This information is not intended to replace advice given to you by your health care provider. Make sure you discuss any questions you have with your health care provider.   Document Released: 09/30/2007 Document Revised: 01/02/2015 Document Reviewed: 08/08/2014 Elsevier Interactive Patient Education [employer].

## 2016-01-03 LAB — CULTURE, BLOOD (ROUTINE X 2)

## 2016-01-05 LAB — CULTURE, BLOOD (ROUTINE X 2): Culture: NO GROWTH

## 2016-01-07 ENCOUNTER — Ambulatory Visit (INDEPENDENT_AMBULATORY_CARE_PROVIDER_SITE_OTHER): Payer: Medicaid Other | Admitting: Family Medicine

## 2016-01-07 DIAGNOSIS — J189 Pneumonia, unspecified organism: Secondary | ICD-10-CM

## 2016-01-07 LAB — CULTURE, BLOOD (ROUTINE X 2)
CULTURE: NO GROWTH
Culture: NO GROWTH

## 2016-01-07 NOTE — Patient Instructions (Signed)
It was a pleasure seeing you today in our clinic. Today we discussed her hospital stay. Here is the treatment plan we have discussed and agreed upon together:   - make sure she takes her antibiotics until the final day discussed by the hospital treatment team.

## 2016-01-07 NOTE — Progress Notes (Signed)
   HPI  CC: Hospital follow-up Patient is here presenting for hospital follow-up after treatment for community acquired pneumonia. Per her cousin who is present for today's visit she is been doing very well. No setbacks at this time. No issues with breathing or sputum production. She has been afebrile to her cousin's knowledge. No recent diarrhea. Also, she has been acting herself since the day of discharge.  She denies any recent fevers, mental status changes, dysuria, urinary frequency/urgency, diarrhea, vomiting, or sleep disturbances.  Review of Systems   See HPI for ROS. All other systems reviewed and are negative.  CC, SH/smoking status, and VS noted  Objective: BP 92/61   Pulse 81   Temp 98.6 F (37 C) (Oral)   Ht 4\' 11"  (1.499 m)   Wt 107 lb (48.5 kg)   LMP  (LMP Unknown)   BMI 21.61 kg/m  Gen: NAD, alert, cooperative, mentally handicapped HEENT: NCAT, MMM, no LAD, TMs clear bilaterally CV: RRR, no murmur Resp: CTAB, no wheezes, non-labored   Assessment and plan:  CAP (community acquired pneumonia) Patient is here for hospital follow-up. Caretaker endorses no setbacks at this time. Has been very compliant with her antibiotic regimen. - Azithromycin completed at this time. - Cefdinir encouraged to continue until completion date. - Note for school provided today. - Follow-up as needed.   Elberta Leatherwood, MD,MS,  PGY3 01/07/2016 6:35 PM

## 2016-01-07 NOTE — Assessment & Plan Note (Signed)
Patient is here for hospital follow-up. Caretaker endorses no setbacks at this time. Has been very compliant with her antibiotic regimen. - Azithromycin completed at this time. - Cefdinir encouraged to continue until completion date. - Note for school provided today. - Follow-up as needed.

## 2016-01-31 ENCOUNTER — Ambulatory Visit: Payer: Medicaid Other

## 2016-01-31 ENCOUNTER — Encounter (HOSPITAL_COMMUNITY): Payer: Self-pay

## 2016-01-31 ENCOUNTER — Emergency Department (HOSPITAL_COMMUNITY)
Admission: EM | Admit: 2016-01-31 | Discharge: 2016-01-31 | Disposition: A | Discharge status: Home / Self Care | Payer: Medicaid Other | Attending: Emergency Medicine | Admitting: Emergency Medicine

## 2016-01-31 DIAGNOSIS — I509 Heart failure, unspecified: Secondary | ICD-10-CM | POA: Diagnosis not present

## 2016-01-31 DIAGNOSIS — Y92410 Unspecified street and highway as the place of occurrence of the external cause: Secondary | ICD-10-CM | POA: Diagnosis not present

## 2016-01-31 DIAGNOSIS — G808 Other cerebral palsy: Secondary | ICD-10-CM | POA: Insufficient documentation

## 2016-01-31 DIAGNOSIS — E039 Hypothyroidism, unspecified: Secondary | ICD-10-CM | POA: Diagnosis not present

## 2016-01-31 DIAGNOSIS — I110 Hypertensive heart disease with heart failure: Secondary | ICD-10-CM | POA: Insufficient documentation

## 2016-01-31 DIAGNOSIS — Y999 Unspecified external cause status: Secondary | ICD-10-CM | POA: Insufficient documentation

## 2016-01-31 DIAGNOSIS — Y939 Activity, unspecified: Secondary | ICD-10-CM | POA: Insufficient documentation

## 2016-01-31 DIAGNOSIS — Z79899 Other long term (current) drug therapy: Secondary | ICD-10-CM | POA: Insufficient documentation

## 2016-01-31 DIAGNOSIS — Z041 Encounter for examination and observation following transport accident: Secondary | ICD-10-CM | POA: Diagnosis not present

## 2016-01-31 DIAGNOSIS — J449 Chronic obstructive pulmonary disease, unspecified: Secondary | ICD-10-CM | POA: Diagnosis not present

## 2016-01-31 NOTE — ED Notes (Signed)
Attempt to call pt to lobby from triage; no response.
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
## 2016-01-31 NOTE — ED Notes (Signed)
PT CALLED WITH NO ANSWER.
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
## 2016-01-31 NOTE — Discharge Instructions (Signed)
Take tylenol or motrin as needed for pain. Ice to areas of soreness for the next 24 hours and then may move to heat, no more than 20 minutes at a time every hour for each. Expect to be sore for the next few days and follow up with primary care physician for recheck of ongoing symptoms in the next 1 week. Return to ER for emergent changing or worsening of symptoms.

## 2016-02-05 ENCOUNTER — Ambulatory Visit (INDEPENDENT_AMBULATORY_CARE_PROVIDER_SITE_OTHER): Payer: Medicaid Other | Admitting: *Deleted

## 2016-02-05 DIAGNOSIS — Z23 Encounter for immunization: Secondary | ICD-10-CM | POA: Diagnosis not present

## 2016-02-22 ENCOUNTER — Encounter (HOSPITAL_COMMUNITY): Payer: Self-pay | Admitting: *Deleted

## 2016-02-22 ENCOUNTER — Inpatient Hospital Stay (HOSPITAL_COMMUNITY)
Admission: EM | Admit: 2016-02-22 | Discharge: 2016-02-27 | DRG: 871 | Disposition: A | Discharge status: Home / Self Care | Payer: Medicaid Other | Attending: Family Medicine | Admitting: Family Medicine

## 2016-02-22 ENCOUNTER — Emergency Department (HOSPITAL_COMMUNITY): Payer: Medicaid Other

## 2016-02-22 DIAGNOSIS — Z881 Allergy status to other antibiotic agents status: Secondary | ICD-10-CM

## 2016-02-22 DIAGNOSIS — N179 Acute kidney failure, unspecified: Secondary | ICD-10-CM | POA: Diagnosis present

## 2016-02-22 DIAGNOSIS — Y95 Nosocomial condition: Secondary | ICD-10-CM | POA: Diagnosis present

## 2016-02-22 DIAGNOSIS — E039 Hypothyroidism, unspecified: Secondary | ICD-10-CM | POA: Diagnosis present

## 2016-02-22 DIAGNOSIS — Z66 Do not resuscitate: Secondary | ICD-10-CM | POA: Diagnosis present

## 2016-02-22 DIAGNOSIS — E162 Hypoglycemia, unspecified: Secondary | ICD-10-CM | POA: Diagnosis not present

## 2016-02-22 DIAGNOSIS — G8250 Quadriplegia, unspecified: Secondary | ICD-10-CM | POA: Diagnosis present

## 2016-02-22 DIAGNOSIS — I509 Heart failure, unspecified: Secondary | ICD-10-CM | POA: Diagnosis present

## 2016-02-22 DIAGNOSIS — J189 Pneumonia, unspecified organism: Secondary | ICD-10-CM | POA: Diagnosis present

## 2016-02-22 DIAGNOSIS — H47619 Cortical blindness, unspecified side of brain: Secondary | ICD-10-CM | POA: Diagnosis present

## 2016-02-22 DIAGNOSIS — I110 Hypertensive heart disease with heart failure: Secondary | ICD-10-CM | POA: Diagnosis present

## 2016-02-22 DIAGNOSIS — G40409 Other generalized epilepsy and epileptic syndromes, not intractable, without status epilepticus: Secondary | ICD-10-CM | POA: Diagnosis present

## 2016-02-22 DIAGNOSIS — A419 Sepsis, unspecified organism: Secondary | ICD-10-CM | POA: Diagnosis present

## 2016-02-22 DIAGNOSIS — R1319 Other dysphagia: Secondary | ICD-10-CM | POA: Diagnosis not present

## 2016-02-22 DIAGNOSIS — R05 Cough: Secondary | ICD-10-CM | POA: Diagnosis present

## 2016-02-22 DIAGNOSIS — R0902 Hypoxemia: Secondary | ICD-10-CM

## 2016-02-22 DIAGNOSIS — R1310 Dysphagia, unspecified: Secondary | ICD-10-CM

## 2016-02-22 DIAGNOSIS — G40309 Generalized idiopathic epilepsy and epileptic syndromes, not intractable, without status epilepticus: Secondary | ICD-10-CM | POA: Diagnosis not present

## 2016-02-22 DIAGNOSIS — G808 Other cerebral palsy: Secondary | ICD-10-CM | POA: Diagnosis present

## 2016-02-22 DIAGNOSIS — E872 Acidosis: Secondary | ICD-10-CM | POA: Diagnosis present

## 2016-02-22 DIAGNOSIS — R059 Cough, unspecified: Secondary | ICD-10-CM

## 2016-02-22 DIAGNOSIS — J440 Chronic obstructive pulmonary disease with acute lower respiratory infection: Secondary | ICD-10-CM | POA: Diagnosis present

## 2016-02-22 DIAGNOSIS — R000 Tachycardia, unspecified: Secondary | ICD-10-CM

## 2016-02-22 DIAGNOSIS — I472 Ventricular tachycardia: Secondary | ICD-10-CM | POA: Diagnosis not present

## 2016-02-22 DIAGNOSIS — F72 Severe intellectual disabilities: Secondary | ICD-10-CM | POA: Diagnosis present

## 2016-02-22 DIAGNOSIS — I10 Essential (primary) hypertension: Secondary | ICD-10-CM | POA: Diagnosis present

## 2016-02-22 DIAGNOSIS — D539 Nutritional anemia, unspecified: Secondary | ICD-10-CM | POA: Diagnosis present

## 2016-02-22 DIAGNOSIS — R6520 Severe sepsis without septic shock: Secondary | ICD-10-CM | POA: Diagnosis present

## 2016-02-22 DIAGNOSIS — Z79899 Other long term (current) drug therapy: Secondary | ICD-10-CM | POA: Diagnosis not present

## 2016-02-22 DIAGNOSIS — G809 Cerebral palsy, unspecified: Secondary | ICD-10-CM | POA: Diagnosis present

## 2016-02-22 LAB — URINE MICROSCOPIC-ADD ON

## 2016-02-22 LAB — COMPREHENSIVE METABOLIC PANEL
ALBUMIN: 3.2 g/dL — AB (ref 3.5–5.0)
ALT: 19 U/L (ref 14–54)
AST: 36 U/L (ref 15–41)
Alkaline Phosphatase: 105 U/L (ref 38–126)
Anion gap: 11 (ref 5–15)
BILIRUBIN TOTAL: 0.4 mg/dL (ref 0.3–1.2)
BUN: 20 mg/dL (ref 6–20)
CHLORIDE: 97 mmol/L — AB (ref 101–111)
CO2: 28 mmol/L (ref 22–32)
CREATININE: 1.26 mg/dL — AB (ref 0.44–1.00)
Calcium: 9.2 mg/dL (ref 8.9–10.3)
GFR calc Af Amer: >60 mL/min (ref >60)
GFR, EST NON AFRICAN AMERICAN: 54 mL/min — AB (ref >60)
GLUCOSE: 134 mg/dL — AB (ref 65–99)
Potassium: 4.6 mmol/L (ref 3.5–5.1)
Sodium: 136 mmol/L (ref 135–145)
Total Protein: 8.9 g/dL — ABNORMAL HIGH (ref 6.5–8.1)

## 2016-02-22 LAB — CBC
HEMATOCRIT: 38.4 % (ref 36.0–46.0)
Hemoglobin: 11.8 g/dL — ABNORMAL LOW (ref 12.0–15.0)
MCH: 33.4 pg (ref 26.0–34.0)
MCHC: 30.7 g/dL (ref 30.0–36.0)
MCV: 108.8 fL — AB (ref 78.0–100.0)
PLATELETS: 215 10*3/uL (ref 150–400)
RBC: 3.53 MIL/uL — AB (ref 3.87–5.11)
RDW: 17.6 % — ABNORMAL HIGH (ref 11.5–15.5)
WBC: 7.5 10*3/uL (ref 4.0–10.5)

## 2016-02-22 LAB — URINALYSIS, ROUTINE W REFLEX MICROSCOPIC
Bilirubin Urine: NEGATIVE
Glucose, UA: NEGATIVE mg/dL
Hgb urine dipstick: NEGATIVE
Ketones, ur: 15 mg/dL — AB
NITRITE: NEGATIVE
PH: 7 (ref 5.0–8.0)
Protein, ur: 30 mg/dL — AB
SPECIFIC GRAVITY, URINE: 1.037 — AB (ref 1.005–1.030)

## 2016-02-22 LAB — I-STAT CG4 LACTIC ACID, ED
LACTIC ACID, VENOUS: 6.29 mmol/L — AB (ref 0.5–1.9)
LACTIC ACID, VENOUS: 3.36 mmol/L — AB (ref 0.5–1.9)

## 2016-02-22 MED ORDER — ALBUTEROL SULFATE (2.5 MG/3ML) 0.083% IN NEBU
2.5000 mg | INHALATION_SOLUTION | Freq: Once | RESPIRATORY_TRACT | Status: DC
  Filled 2016-02-22: qty 3

## 2016-02-22 MED ORDER — ASPIRIN 300 MG RE SUPP
300.0000 mg | Freq: Once | RECTAL | Status: DC
  Filled 2016-02-22: qty 1

## 2016-02-22 MED ORDER — PIPERACILLIN-TAZOBACTAM 3.375 G IVPB 30 MIN
3.3750 g | Freq: Once | INTRAVENOUS | Status: AC
  Administered 2016-02-22: 3.375 g via INTRAVENOUS
  Filled 2016-02-22: qty 50

## 2016-02-22 MED ORDER — SODIUM CHLORIDE 0.9 % IV BOLUS (SEPSIS)
1500.0000 mL | Freq: Once | INTRAVENOUS | Status: AC
  Administered 2016-02-22: 1500 mL via INTRAVENOUS

## 2016-02-22 MED ORDER — ACETAMINOPHEN 650 MG RE SUPP
650.0000 mg | Freq: Once | RECTAL | Status: AC
  Administered 2016-02-22: 650 mg via RECTAL
  Filled 2016-02-22: qty 1

## 2016-02-22 MED ORDER — IPRATROPIUM-ALBUTEROL 0.5-2.5 (3) MG/3ML IN SOLN
3.0000 mL | Freq: Once | RESPIRATORY_TRACT | Status: AC
  Administered 2016-02-22: 3 mL via RESPIRATORY_TRACT
  Filled 2016-02-22: qty 3

## 2016-02-22 MED ORDER — VANCOMYCIN HCL IN DEXTROSE 1-5 GM/200ML-% IV SOLN
1000.0000 mg | Freq: Once | INTRAVENOUS | Status: AC
  Administered 2016-02-22: 1000 mg via INTRAVENOUS
  Filled 2016-02-22: qty 200

## 2016-02-22 NOTE — ED Notes (Signed)
Unable to locate patient in waiting area or restrooms

## 2016-02-22 NOTE — ED Notes (Signed)
istat lactic to be collected after fluid bolus completed. Nearly continuous cough noted, thick sputum expelled at times

## 2016-02-22 NOTE — ED Notes (Signed)
Family practice called for possible ICU med placement. Pt continues to TC in 140's, sat now 91% on 2L. Congested cough noted with bouts of sputum being expelled. Grandmother suctioning patient PRN

## 2016-02-22 NOTE — ED Notes (Signed)
RT at bedside.

## 2016-02-22 NOTE — ED Notes (Signed)
Pt to room xray

## 2016-02-22 NOTE — ED Notes (Signed)
Duoneb started,

## 2016-02-22 NOTE — ED Notes (Signed)
MD at bedside for reassessment, wet cough now constant with episodes of 02 sat down to 88%. Suction in use intermittent by grandmother. RT called neb and chest PT.

## 2016-02-22 NOTE — ED Provider Notes (Signed)
[place] DEPT Provider Note   CSN: [id number] Arrival date & time: 02/22/16  1821     History   Chief Complaint Chief Complaint  Patient presents with  . Cough    HPI Megan [name] is a 35 y.o. female.  Patient presents with grandmother (her legal guardian) for one day of cough and difficulty breathing. 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 No known fevers. No known infectious contacts. Was admitted last month for community acquired pneumonia. No history of vomiting or diarrhea, has been tolerating her G-tube feeds without difficulty.   The history is provided by a caregiver. The history is limited by the condition of the patient.    Past Medical History:  Diagnosis Date  . Allergic rhinitis 11/04/2012  . Cerebral palsy ([place])   . 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 CHF (congestive heart failure) ([place])   . COPD (chronic obstructive pulmonary disease) ([place])   . Cortical blindness   . Dilantin toxicity August 2014  . Hypertension 06/23/2010  . Mental retardation   . Pneumonia December 2014   Was intubated, [facility]  . Seizures ([place])   . Vision abnormalities     Patient Active Problem List   Diagnosis Date Noted  . Pneumonia 02/23/2016  . HCAP (healthcare-associated pneumonia) 02/22/2016  . CAP (community acquired pneumonia) 12/31/2015  . 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 Dysphagia 02/12/2014  . 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 Cough 01/26/2014  . Localization-related symptomatic epilepsy and epileptic syndromes with complex partial seizures, not intractable, without status epilepticus ([place]) 08/23/2013  . Unspecified constipation 04/18/2013  . Fibroid 04/18/2013  . Abnormal mental state 03/27/2013  . Seizure disorder ([place]) 03/27/2013  . Allergic rhinitis 11/04/2012  . 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 Encounter for long-term (current) use of other medications 10/19/2012  .  Severe intellectual disabilities 10/19/2012  . 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 Feeding difficulties and mismanagement 02/19/2012  . Hypothyroidism 06/25/2010  . Hypertension 06/23/2010  . Infantile cerebral palsy ([place]) 02/21/2010  . Blindness of both eyes 02/21/2010    Past Surgical History:  Procedure Laterality Date  . PEG PLACEMENT  09/03/2011   Procedure: PERCUTANEOUS ENDOSCOPIC GASTROSTOMY (PEG) REPLACEMENT;  Surgeon: Jerene Bears, MD;  Location: [facility] ENDOSCOPY;  Service: Gastroenterology;  Laterality: N/A;  . PEG PLACEMENT  02/19/2012   Procedure: PERCUTANEOUS ENDOSCOPIC GASTROSTOMY (PEG) REPLACEMENT;  Surgeon: Lafayette Dragon, MD;  Location: [facility] ENDOSCOPY;  Service: Endoscopy;  Laterality: N/A;  needs gastrostomy button 24 Fr 4.4 and 3.5 available  . PEG PLACEMENT N/A 05/30/2013   Procedure: PERCUTANEOUS ENDOSCOPIC GASTROSTOMY (PEG) REPLACEMENT;  Surgeon: Jerene Bears, MD;  Location: [place];  Service: Gastroenterology;  Laterality: N/A;  . PEG PLACEMENT N/A 07/03/2013   Procedure: PERCUTANEOUS ENDOSCOPIC GASTROSTOMY (PEG) REPLACEMENT;  Surgeon: Lafayette Dragon, MD;  Location: [facility] ENDOS[facility];  Service: Endoscopy;  Laterality: N/A;  Replace with 20 Fr x 3.5 cm mini button-  . PEG TUBE PLACEMENT      OB History    No data available       Home Medications    Prior to Admission medications   Medication Sig Start Date End Date Taking? Authorizing Provider  Dextromethorphan Polistirex (DELSYM PO) Place 12.5 mLs into feeding tube every 12 (twelve) hours as needed (cough).   Yes Historical Provider, MD  diazepam (DIASTAT ACUDIAL) 10 MG GEL Place 5 mg rectally once. 01/26/14  Yes Hattie Perch  Adamo, MD  ibuprofen (ADVIL,MOTRIN) 100 MG/5ML suspension Place 240 mg into feeding tube every 8 (eight) hours as needed for fever. 12 ml    Yes Historical Provider, MD  lacosamide (VIMPAT) 10 MG/ML oral solution GIVE 5 ML BY MOUTH EVERY MORNING AND 5 ML BY MOUTH  IN THE EVENING Patient taking differently: 50 mg 2 (two) times daily. Per tube at 6am and 6pm 11/13/15  Yes Rockwell Germany, NP  levETIRAcetam (KEPPRA) 100 MG/ML solution GIVE 12.5 MLS IN THE MORNING AND 12.5 MLS IN THE EVENING BY PEG TUBE Patient taking differently: Place 1,250 mg into feeding tube 2 (two) times daily.  11/13/15  Yes Rockwell Germany, NP  levothyroxine (SYNTHROID, LEVOTHROID) 88 MCG tablet TAKE 1 TABLET (88 MCG TOTAL) BY MOUTH DAILY. Patient taking differently: TAKE 1 TABLET (88 MCG TOTAL) PER TUBE DAILY. 06/24/15  Yes Frazier Richards, MD  loratadine (CLARITIN) 5 MG/5ML syrup Place 10 mLs (10 mg total) into feeding tube daily. Patient taking differently: Place 10 mg into feeding tube daily as needed (congestion).  07/02/15  Yes Frazier Richards, MD  Nutritional Supplements (FEEDING SUPPLEMENT, JEVITY 1.2 CAL,) LIQD Place 574 mLs into feeding tube See admin instructions. Give 2 cans (574 mls) per tube at 40 ml/hr daily   Yes Historical Provider, MD  ranitidine (ZANTAC) 75 MG/5ML syrup TAKE 5 MLS (75 MG TOTAL) BY PER J TUBE ROUTE 2 (TWO) TIMES DAILY. 07/27/15  Yes Frazier Richards, MD  Valproate Sodium (DEPAKENE) 250 MG/5ML SOLN solution Give 97ml by g-tube every 6 hours Patient taking differently: Place 625 mg into feeding tube See admin instructions. Give 12.5 mls per tube 3 times daily at 6am, 12pm and 6pm 11/13/15  Yes Rockwell Germany, NP  guaiFENesin 200 MG tablet Take 1 tablet (200 mg total) by mouth every 4 (four) hours as needed for cough or to loosen phlegm. Patient not taking: Reported on 02/23/2016 04/18/14   Timmothy Euler, MD  Nutritional Supplements (JEVITY) LIQD 1 Can by Per J Tube route 3 (three) times daily. Patient not taking: Reported on 02/23/2016 01/24/15   Frazier Richards, MD    Family History Family History  Problem Relation Age of Onset  . Diabetes Mother   . Diabetes Maternal Grandmother   . Stroke Maternal Grandmother   . Cancer Maternal Grandfather     Died at 40   . Diabetes Maternal Aunt   . Hypertension Other     Fhx    Social History Social History  Substance Use Topics  . Smoking status: Never Smoker  . Smokeless tobacco: Never Used  . Alcohol use No     Allergies   Doxycycline and Other   Review of Systems Review of Systems  Constitutional: Negative for fever.  HENT: Negative.   Respiratory: Positive for cough and shortness of breath.   Cardiovascular: Negative.   Gastrointestinal: Negative for vomiting.  Genitourinary: Negative.   Musculoskeletal: Negative.   Skin: Negative for wound.  Neurological: Negative.   Hematological: Does not bruise/bleed easily.  Psychiatric/Behavioral: Negative for confusion.     Physical Exam Updated Vital Signs BP (!) 120/104   Pulse (!) 118   Temp 98.1 F (36.7 C) (Oral)   Resp 20   Ht 5' (1.524 m)   Wt 48.5 kg   SpO2 95%   BMI 20.88 kg/m   Physical Exam  Constitutional: She appears well-developed and well-nourished.  Non-toxic appearance. No distress.  HENT:  Head: Atraumatic.  Eyes:  Pupils 3 to  2 mm and reactive bilaterally. Exotropia.   Neck: No tracheal deviation present.  Cardiovascular: Regular rhythm, S1 normal, S2 normal, normal heart sounds and intact distal pulses.  Tachycardia present.   Pulmonary/Chest: No accessory muscle usage or stridor. Tachypnea noted. She has decreased breath sounds in the right middle field and the right lower field. She has no wheezes. She has rhonchi in the right middle field, the right lower field, the left middle field and the left lower field. She has no rales.  Abdominal: Soft. There is no tenderness. There is no rigidity and no guarding.  G tube site intact without purulence or erythema  Neurological: She is alert.  Spastic extremities x 4. At baseline mental status per her grandmother. Non-verbal. Does not follow commands. Responds to painful stimuli in all extremities.  Skin: Skin is warm, dry and intact.  No skin injuries or  pressure ulcers identified.     ED Treatments / Results  Labs (all labs ordered are listed, but only abnormal results are displayed) Labs Reviewed  MRSA PCR SCREENING - Abnormal; Notable for the following:       Result Value   MRSA by PCR POSITIVE (*)    All other components within normal limits  CBC - Abnormal; Notable for the following:    RBC 3.53 (*)    Hemoglobin 11.8 (*)    MCV 108.8 (*)    RDW 17.6 (*)    All other components within normal limits  COMPREHENSIVE METABOLIC PANEL - Abnormal; Notable for the following:    Chloride 97 (*)    Glucose, Bld 134 (*)    Creatinine, Ser 1.26 (*)    Total Protein 8.9 (*)    Albumin 3.2 (*)    GFR calc non Af Amer 54 (*)    All other components within normal limits  URINALYSIS, ROUTINE W REFLEX MICROSCOPIC (NOT AT [facility]) - Abnormal; Notable for the following:    Color, Urine AMBER (*)    Specific Gravity, Urine 1.037 (*)    Ketones, ur 15 (*)    Protein, ur 30 (*)    Leukocytes, UA TRACE (*)    All other components within normal limits  URINE MICROSCOPIC-ADD ON - Abnormal; Notable for the following:    Squamous Epithelial / LPF 0-5 (*)    Bacteria, UA FEW (*)    All other components within normal limits  LACTIC ACID, PLASMA - Abnormal; Notable for the following:    Lactic Acid, Venous 2.7 (*)    All other components within normal limits  BASIC METABOLIC PANEL - Abnormal; Notable for the following:    Chloride 112 (*)    Glucose, Bld 105 (*)    Calcium 8.2 (*)    All other components within normal limits  BLOOD GAS, ARTERIAL - Abnormal; Notable for the following:    pO2, Arterial 76.4 (*)    All other components within normal limits  MAGNESIUM - Abnormal; Notable for the following:    Magnesium 1.6 (*)    All other components within normal limits  VALPROIC ACID LEVEL - Abnormal; Notable for the following:    Valproic Acid Lvl 128 (*)    All other components within normal limits  CBC - Abnormal; Notable for the following:     RBC 2.79 (*)    Hemoglobin 9.1 (*)    HCT 29.9 (*)    MCV 107.2 (*)    RDW 18.0 (*)    Platelets 142 (*)    All  other components within normal limits  I-STAT CG4 LACTIC ACID, ED - Abnormal; Notable for the following:    Lactic Acid, Venous 6.29 (*)    All other components within normal limits  I-STAT CG4 LACTIC ACID, ED - Abnormal; Notable for the following:    Lactic Acid, Venous 3.36 (*)    All other components within normal limits  I-STAT ARTERIAL BLOOD GAS, ED - Abnormal; Notable for the following:    pO2, Arterial 57.0 (*)    All other components within normal limits  CULTURE, BLOOD (ROUTINE X 2)  CULTURE, BLOOD (ROUTINE X 2)  URINE CULTURE  CULTURE, RESPIRATORY (NON-EXPECTORATED)  PHOSPHORUS    EKG  EKG Interpretation  Date/Time:  Saturday February 22 2016 23:40:45 EDT Ventricular Rate:  141 PR Interval:    QRS Duration: 67 QT Interval:  272 QTC Calculation: 418 R Axis:   45 Text Interpretation:  Sinus tachycardia Paired ventricular premature complexes Borderline low voltage, extremity leads Nonspecific repol abnormality, diffuse leads Baseline wander in lead(s) V4 No significant change since last tracing Confirmed by POLLINA  MD, CHRISTOPHER [phone]) on 02/22/2016 11:47:55 PM       Radiology Dg Chest 2 View  Result Date: 02/22/2016 CLINICAL DATA:  Cough. EXAM: CHEST  2 VIEW COMPARISON:  Radiographs of December 31, 2015. FINDINGS: The heart size and mediastinal contours are within normal limits. Left lung is clear. Possible mild right posterior basilar opacity is noted. No pneumothorax or pleural effusion is noted. The visualized skeletal structures are unremarkable. IMPRESSION: Possible mild right posterior basilar opacity is noted concerning for atelectasis or infiltrate. Electronically Signed   By: Marijo Conception, M.D.   On: 02/22/2016 20:52    Procedures Procedures (including critical care time)  Medications Ordered in ED Medications  0.9 %  sodium  chloride infusion (not administered)  enoxaparin (LOVENOX) injection 40 mg (40 mg Subcutaneous Given 02/23/16 1017)  guaiFENesin tablet 200 mg (not administered)  levETIRAcetam (KEPPRA) 100 MG/ML solution 1,250 mg (1,250 mg Per Tube Given 02/23/16 1047)  levothyroxine (SYNTHROID, LEVOTHROID) tablet 88 mcg (88 mcg Per Tube Given 02/23/16 1016)  ranitidine (ZANTAC) 150 MG/10ML syrup 75 mg (75 mg Per J Tube Given 02/23/16 1000)  ceFEPIme (MAXIPIME) 1 g in dextrose 5 % 50 mL IVPB (1 g Intravenous Given 02/23/16 1016)  vancomycin (VANCOCIN) IVPB 750 mg/150 ml premix (not administered)  0.9 %  sodium chloride infusion ( Intravenous New Bag/Given 02/23/16 0202)  MEDLINE mouth rinse (15 mLs Mouth Rinse Given 02/23/16 1000)  acetaminophen (TYLENOL) solution 650 mg (650 mg Per Tube Given 02/23/16 0243)  metoprolol (LOPRESSOR) injection 2.5-5 mg (5 mg Intravenous Given 02/23/16 1459)  mupirocin ointment (BACTROBAN) 2 % 1 application (1 application Nasal Given 02/23/16 1353)  Chlorhexidine Gluconate Cloth 2 % PADS 6 each (6 each Topical Given 02/23/16 1000)  sodium chloride 0.9 % bolus 1,500 mL (0 mLs Intravenous Stopped 02/22/16 2331)  acetaminophen (TYLENOL) suppository 650 mg (650 mg Rectal Given 02/22/16 2100)  vancomycin (VANCOCIN) IVPB 1000 mg/200 mL premix (0 mg Intravenous Stopped 02/22/16 2312)  piperacillin-tazobactam (ZOSYN) IVPB 3.375 g (0 g Intravenous Stopped 02/22/16 2219)  ipratropium-albuterol (DUONEB) 0.5-2.5 (3) MG/3ML nebulizer solution 3 mL (3 mLs Nebulization Given 02/22/16 2141)  sodium chloride 0.9 % bolus 1,000 mL (1,000 mLs Intravenous Given 02/23/16 0324)  magnesium sulfate IVPB 2 g 50 mL (2 g Intravenous Given 02/23/16 1353)     Initial Impression / Assessment and Plan / ED Course  I have reviewed the triage vital  signs and the nursing notes.  Pertinent labs & imaging results that were available during my care of the patient were reviewed by me and considered in my medical  decision making (see chart for details).  Clinical Course    Patient presents with one day of cough, febrile to 102 rectally and tachycardic, concerning for severe sepsis with a lactic acid elevated to 6 in triage. Recently treated for CAP last month. She initially had normal oxygen saturation on room air, did at one point desaturate after copious secretions were suctioned. She was given Duonebs and chest Pt, deep suctioned, and then required 2-3 L of oxygen by nasal cannula. Code sepsis initiated, she was given 30 cc/kg of NS and started on vanc/zosyn for presumed HCAP. CXR confirmed right posterior infiltrate concerning for lobar pneumonia. She had a mild AKI and labs were otherwise unremarkable. No signs of skin infection on exam. She will be admitted to family medicine to the stepdown unit given severe sepsis and her vital sign abnormalities.  Final Clinical Impressions(s) / ED Diagnoses   Final diagnoses:  HCAP (healthcare-associated pneumonia)  Cough  Tachycardia    New Prescriptions Current Discharge Medication List       Harlin Heys, MD 02/23/16 [id number]    Leo Grosser, MD 02/24/16 [phone]

## 2016-02-22 NOTE — ED Notes (Addendum)
Lactic acid 6.29 per mini lab  Dr Audie Pinto notified  Nurse first aware bed is needed

## 2016-02-22 NOTE — ED Triage Notes (Signed)
The pt is c/o a cough all day with a low grade temp  Her sputum is white thin  lmp a long time  ago

## 2016-02-23 DIAGNOSIS — A419 Sepsis, unspecified organism: Secondary | ICD-10-CM

## 2016-02-23 DIAGNOSIS — J189 Pneumonia, unspecified organism: Secondary | ICD-10-CM | POA: Diagnosis present

## 2016-02-23 LAB — I-STAT ARTERIAL BLOOD GAS, ED
BICARBONATE: 24.9 mmol/L (ref 20.0–28.0)
O2 SAT: 90 %
TCO2: 26 mmol/L (ref 0–100)
pCO2 arterial: 39.5 mmHg (ref 32.0–48.0)
pH, Arterial: 7.407 (ref 7.350–7.450)
pO2, Arterial: 57 mmHg — ABNORMAL LOW (ref 83.0–108.0)

## 2016-02-23 LAB — BASIC METABOLIC PANEL
ANION GAP: 6 (ref 5–15)
BUN: 14 mg/dL (ref 6–20)
CHLORIDE: 112 mmol/L — AB (ref 101–111)
CO2: 22 mmol/L (ref 22–32)
Calcium: 8.2 mg/dL — ABNORMAL LOW (ref 8.9–10.3)
Creatinine, Ser: 1 mg/dL (ref 0.44–1.00)
GFR calc non Af Amer: >60 mL/min (ref >60)
Glucose, Bld: 105 mg/dL — ABNORMAL HIGH (ref 65–99)
POTASSIUM: 4.4 mmol/L (ref 3.5–5.1)
SODIUM: 140 mmol/L (ref 135–145)

## 2016-02-23 LAB — MRSA PCR SCREENING: MRSA BY PCR: POSITIVE — AB

## 2016-02-23 LAB — BLOOD GAS, ARTERIAL
Acid-base deficit: 1.4 mmol/L (ref 0.0–2.0)
Bicarbonate: 22.9 mmol/L (ref 20.0–28.0)
DRAWN BY: 31101
O2 CONTENT: 8 L/min
O2 SAT: 94.5 %
PATIENT TEMPERATURE: 98.6
PO2 ART: 76.4 mmHg — AB (ref 83.0–108.0)
pCO2 arterial: 39.3 mmHg (ref 32.0–48.0)
pH, Arterial: 7.384 (ref 7.350–7.450)

## 2016-02-23 LAB — CBC
HCT: 29.9 % — ABNORMAL LOW (ref 36.0–46.0)
HEMOGLOBIN: 9.1 g/dL — AB (ref 12.0–15.0)
MCH: 32.6 pg (ref 26.0–34.0)
MCHC: 30.4 g/dL (ref 30.0–36.0)
MCV: 107.2 fL — ABNORMAL HIGH (ref 78.0–100.0)
PLATELETS: 142 10*3/uL — AB (ref 150–400)
RBC: 2.79 MIL/uL — AB (ref 3.87–5.11)
RDW: 18 % — ABNORMAL HIGH (ref 11.5–15.5)
WBC: 7.1 10*3/uL (ref 4.0–10.5)

## 2016-02-23 LAB — VALPROIC ACID LEVEL: VALPROIC ACID LVL: 128 ug/mL — AB (ref 50.0–100.0)

## 2016-02-23 LAB — PHOSPHORUS: Phosphorus: 2.8 mg/dL (ref 2.5–4.6)

## 2016-02-23 LAB — LACTIC ACID, PLASMA: Lactic Acid, Venous: 2.7 mmol/L (ref 0.5–1.9)

## 2016-02-23 LAB — MAGNESIUM: MAGNESIUM: 1.6 mg/dL — AB (ref 1.7–2.4)

## 2016-02-23 MED ORDER — SODIUM CHLORIDE 0.9 % IV SOLN: 250.0000 mL | INTRAVENOUS | Status: DC | PRN

## 2016-02-23 MED ORDER — ENOXAPARIN SODIUM 40 MG/0.4ML ~~LOC~~ SOLN
40.0000 mg | Freq: Every day | SUBCUTANEOUS | Status: DC
  Administered 2016-02-23 – 2016-02-27 (×5): 40 mg via SUBCUTANEOUS
  Filled 2016-02-23 (×5): qty 0.4

## 2016-02-23 MED ORDER — SODIUM CHLORIDE 0.9 % IV BOLUS (SEPSIS)
1000.0000 mL | Freq: Once | INTRAVENOUS | Status: AC
  Administered 2016-02-23: 1000 mL via INTRAVENOUS

## 2016-02-23 MED ORDER — ORAL CARE MOUTH RINSE
15.0000 mL | Freq: Two times a day (BID) | OROMUCOSAL | Status: DC
  Administered 2016-02-23 – 2016-02-24 (×4): 15 mL via OROMUCOSAL

## 2016-02-23 MED ORDER — LEVETIRACETAM 100 MG/ML PO SOLN
1250.0000 mg | Freq: Two times a day (BID) | ORAL | Status: DC
  Administered 2016-02-23 – 2016-02-27 (×10): 1250 mg
  Filled 2016-02-23 (×10): qty 15

## 2016-02-23 MED ORDER — METOPROLOL TARTRATE 5 MG/5ML IV SOLN
2.5000 mg | INTRAVENOUS | Status: DC | PRN
  Administered 2016-02-23 – 2016-02-24 (×4): 5 mg via INTRAVENOUS
  Administered 2016-02-24: 2.5 mg via INTRAVENOUS
  Filled 2016-02-23 (×5): qty 5

## 2016-02-23 MED ORDER — GUAIFENESIN 200 MG PO TABS
200.0000 mg | ORAL_TABLET | ORAL | Status: DC | PRN
  Filled 2016-02-23: qty 1

## 2016-02-23 MED ORDER — MAGNESIUM SULFATE 2 GM/50ML IV SOLN
2.0000 g | Freq: Once | INTRAVENOUS | Status: AC
  Administered 2016-02-23: 2 g via INTRAVENOUS
  Filled 2016-02-23: qty 50

## 2016-02-23 MED ORDER — DEXTROSE 5 % IV SOLN
1.0000 g | Freq: Two times a day (BID) | INTRAVENOUS | Status: DC
  Administered 2016-02-23 – 2016-02-26 (×7): 1 g via INTRAVENOUS
  Filled 2016-02-23 (×9): qty 1

## 2016-02-23 MED ORDER — LEVOTHYROXINE SODIUM 88 MCG PO TABS
88.0000 ug | ORAL_TABLET | Freq: Every day | ORAL | Status: DC
  Administered 2016-02-23 – 2016-02-27 (×5): 88 ug
  Filled 2016-02-23 (×5): qty 1

## 2016-02-23 MED ORDER — VALPROATE SODIUM 250 MG/5ML PO SOLN
650.0000 mg | Freq: Four times a day (QID) | ORAL | Status: DC
  Administered 2016-02-23 (×2): 650 mg
  Filled 2016-02-23 (×4): qty 15

## 2016-02-23 MED ORDER — RANITIDINE HCL 150 MG/10ML PO SYRP
75.0000 mg | ORAL_SOLUTION | Freq: Two times a day (BID) | ORAL | Status: DC
  Administered 2016-02-23 – 2016-02-27 (×10): 75 mg via JEJUNOSTOMY
  Filled 2016-02-23 (×13): qty 10

## 2016-02-23 MED ORDER — CHLORHEXIDINE GLUCONATE CLOTH 2 % EX PADS
6.0000 | MEDICATED_PAD | Freq: Every day | CUTANEOUS | Status: AC
  Administered 2016-02-23 – 2016-02-27 (×5): 6 via TOPICAL

## 2016-02-23 MED ORDER — SODIUM CHLORIDE 0.9 % IV SOLN
INTRAVENOUS | Status: DC
  Administered 2016-02-23 – 2016-02-25 (×4): via INTRAVENOUS

## 2016-02-23 MED ORDER — MUPIROCIN 2 % EX OINT
1.0000 "application " | TOPICAL_OINTMENT | Freq: Two times a day (BID) | CUTANEOUS | Status: DC
  Administered 2016-02-23 – 2016-02-27 (×9): 1 via NASAL
  Filled 2016-02-23 (×3): qty 22

## 2016-02-23 MED ORDER — VANCOMYCIN HCL IN DEXTROSE 750-5 MG/150ML-% IV SOLN
750.0000 mg | INTRAVENOUS | Status: DC
  Administered 2016-02-23 – 2016-02-25 (×3): 750 mg via INTRAVENOUS
  Filled 2016-02-23 (×5): qty 150

## 2016-02-23 MED ORDER — ACETAMINOPHEN 160 MG/5ML PO SOLN
650.0000 mg | Freq: Four times a day (QID) | ORAL | Status: DC | PRN
  Administered 2016-02-23: 650 mg
  Filled 2016-02-23: qty 20.3

## 2016-02-23 NOTE — Progress Notes (Signed)
Pharmacy Antibiotic Note  Megan [name] is a 35 y.o. female admitted on 02/22/2016 with pneumonia.  Pharmacy has been consulted for Vancomycin/Cefepime dosing. WBC WNL. Pt has hx cerebral palsy/quadraplegia. Pt in acute renal failure with Scr 1.26 (given that baseline is around 0.5). Difficult to estimate creatine clearance in this patient given low muscle mass. CXR with basilar opacity.   Plan: -Vancomycin 1000 mg IV x 1 already given in ED, then give 750 mg IV q24h -Cefepime 1g IV q12h -Trend WBC, temp, renal function  -Drug levels at steady state   Temp (24hrs), Avg:100.8 F (38.2 C), Min:99.5 F (37.5 C), Max:102.1 F (38.9 C)   Recent Labs Lab 02/22/16 1915 02/22/16 1927 02/22/16 2343  WBC 7.5  --   --   CREATININE 1.26*  --   --   LATICACIDVEN  --  6.29* 3.36*    CrCl cannot be calculated (Unknown ideal weight.).    Allergies  Allergen Reactions  . Doxycycline Rash and Other (See Comments)    REACTION: Blisters / swelling  . Other Other (See Comments)    Seasonal Allergies      [place], [place] 02/23/2016 12:59 AM

## 2016-02-23 NOTE — Progress Notes (Signed)
eLink Physician-Brief Progress Note Patient Name: Megan [name] DOB: 09/15/1980 MRN: [id number]   Date of Service  02/23/2016  HPI/Events of Note  Patient with CP admitted with sepsis and hypoxia from presumed pneumonia.  Still having significant desaturation at times with HFNC.  She is DNR.  Intubation not desired.  I do not feel Bipap will benefit patient and may be dangerous as she is unable to communicate.  eICU Interventions  Continue HFNC and titrate to keep sat greater than 88%     Intervention Category Major Interventions: Hypoxemia - evaluation and management  Mauri [place], P 02/23/2016, 9:28 PM

## 2016-02-23 NOTE — ED Notes (Signed)
CCM at bedside 

## 2016-02-23 NOTE — ED Notes (Signed)
RT at bedside for ABG

## 2016-02-23 NOTE — Progress Notes (Signed)
PULMONARY / CRITICAL CARE MEDICINE   Name: Nisaa Sucher MRN: [id number] DOB: [birth year]    ADMISSION DATE:  02/22/2016  CHIEF COMPLAINT:  Cough  HISTORY OF PRESENT ILLNESS:   Megan [name] is a 35 y.o. female with cerebral palsy (non-communicative at baseline) presenting with cough and difficulty breathing at home. She was reportedly well until this morning when she started having coughing fits and was producing thick sputum as well as tachypnea. Temperature at home was 99.  Patient was recently hospitalized last month for community-acquired pneumonia. In the emergency department she was noted to have fever 102, and was tachycardic, with lactic acidosis to 6.3. She was treated with IVF, vanc, and zoysn and family medicine was called for admission. CXR was concerning for possible pneumonia. However, due to her high needs for suctioning, as well as severe sepsis, PCCM was called for admission. The patient's grandmother (who is legal guardian) stated that she wanted her daughter to be well, but didn't want to put her through an experience of mechanical ventilation, even if it was intended to be temporary, and that "if it came to that, she should just go."  SUBJECTIVE:  No events overnight, protecting airway well.  VITAL SIGNS: BP 98/67   Pulse (!) 103   Temp 98.2 F (36.8 C) (Oral)   Resp (!) 9   Ht 5' (1.524 m)   Wt 48.5 kg (106 lb 14.8 oz)   SpO2 100%   BMI 20.88 kg/m   HEMODYNAMICS:    VENTILATOR SETTINGS:    INTAKE / OUTPUT: I/O last 3 completed shifts: In: 3377.5 [I.V.:1297.5; Other:30; IV [id number] Out: -   PHYSICAL EXAMINATION: General:  Awake, but non-responsive (baseline per grandmother) Neuro:  Non-responsive, spastic changes in all extremities HEENT:  Enlarged, protruding tongue. Disconjugate gaze. Cardiovascular:  Tachycardic, but normal heart soudns Lungs:  Rhonchi Abdomen:  Soft Musculoskeletal:  Spatic changes, but no apparent injuries Skin:  No  rashes on visible skin  LABS:  BMET  Recent Labs Lab 02/22/16 1915 02/23/16 0512  NA 136 140  K 4.6 4.4  CL 97* 112*  CO2 28 22  BUN 20 14  CREATININE 1.26* 1.00  GLUCOSE 134* 105*   Electrolytes  Recent Labs Lab 02/22/16 1915 02/23/16 0512  CALCIUM 9.2 8.2*  MG  --  1.6*  PHOS  --  2.8   CBC  Recent Labs Lab 02/22/16 1915 02/23/16 0632  WBC 7.5 7.1  HGB 11.8* 9.1*  HCT 38.4 29.9*  PLT 215 142*   Coag's No results for input(s): APTT, INR in the last 168 hours.  Sepsis Markers  Recent Labs Lab 02/22/16 1927 02/22/16 2343 02/23/16 0515  LATICACIDVEN 6.29* 3.36* 2.7*   ABG  Recent Labs Lab 02/23/16 0022 02/23/16 0455  PHART 7.407 7.384  PCO2ART 39.5 39.3  PO2ART 57.0* 76.4*   Liver Enzymes  Recent Labs Lab 02/22/16 1915  AST 36  ALT 19  ALKPHOS 105  BILITOT 0.4  ALBUMIN 3.2*   Cardiac Enzymes No results for input(s): TROPONINI, PROBNP in the last 168 hours.  Glucose No results for input(s): GLUCAP in the last 168 hours.  Imaging Dg Chest 2 View  Result Date: 02/22/2016 CLINICAL DATA:  Cough. EXAM: CHEST  2 VIEW COMPARISON:  Radiographs of December 31, 2015. FINDINGS: The heart size and mediastinal contours are within normal limits. Left lung is clear. Possible mild right posterior basilar opacity is noted. No pneumothorax or pleural effusion is noted. The visualized skeletal structures are unremarkable.  IMPRESSION: Possible mild right posterior basilar opacity is noted concerning for atelectasis or infiltrate. Electronically Signed   By: Marijo Conception, M.D.   On: 02/22/2016 20:52   STUDIES:  CXR: Resolving infiltrates compared to prior, possible infiltrate on posterior segment of R lobe.  CULTURES: Blood 10/29 >> pending Sputum, ordered  ANTIBIOTICS: Vanc 10/28 >> Zosyn 10/28 Cefepime 10/29 >>  SIGNIFICANT EVENTS: None  LINES/TUBES: PIV x2 22ga (in hand)  DISCUSSION: 35 y/o woman with severe CP presenting with  severe sepsis; pneumonia as possible source, although Xray is unconvincing.  Lactic acid clearing and BP responding to IVF.  Patient is DNR.  ASSESSMENT / PLAN:  PULMONARY A: Sepsis of pulmonary origin Hypoxia P:   Hypoxia likely due to sepsis, resolving HCAP coverage per ID section below. Titrate O2 for sat of 88-92%.  CARDIOVASCULAR A:  Tachycardia P:  Baseline neurologic condition raises concern for autonomic dysfunction  RENAL A:   AKI Lactic acidosis, resolving P:   Due to sepsis, will follow BMET in AM Replace electrolytes as indicated.  GASTROINTESTINAL A:   Need for tube feeds P:   Consult placed to dietary for tube feeds   HEMATOLOGIC A:   Lovenox ppx ordered P:  SCDs  INFECTIOUS A:   Probable HCAP, possible other sources P:   F/u blood, sputum, and urine cx. Tx broadly for now.  ENDOCRINE A:   Hypothyroidism P:   Home synthroid  NEUROLOGIC A:   CP with seizures P:   Home AEDs ordered F/u Depakote level  FAMILY  - Updates: No family bedside  - Inter-disciplinary family meet or Palliative Care meeting due by:  03/01/16  Transfer to SDU and to [facility] service with PCCM off 10/30  The patient is critically ill with multiple organ systems failure and requires high complexity decision making for assessment and support, frequent evaluation and titration of therapies, application of advanced monitoring technologies and extensive interpretation of multiple databases.   Critical Care Time devoted to patient care services described in this note is  45  Minutes. This time reflects time of care of this signee Dr Jennet Maduro. This critical care time does not reflect procedure time, or teaching time or supervisory time of PA/NP/Med student/Med Resident etc but could involve care discussion time.  Rush Farmer, M.D. [facility] Pulmonary/Critical Care Medicine. Pager: [phone]. After hours pager: [phone].  02/23/2016, 9:38 AM

## 2016-02-23 NOTE — ED Notes (Signed)
Report called to [facility] 58

## 2016-02-23 NOTE — H&P (Signed)
PULMONARY / CRITICAL CARE MEDICINE   Name: Texana Hable MRN: [id number] DOB: August 07, 1980    ADMISSION DATE:  02/22/2016  CHIEF COMPLAINT:  Cough  HISTORY OF PRESENT ILLNESS:   Megan [name] is a 35 y.o. female with cerebral palsy (non-communicative at baseline) presenting with cough and difficulty breathing at home. She was reportedly well until this morning when she started having coughing fits and was producing thick sputum as well as tachypnea. Temperature at home was 99.  Patient was recently hospitalized last month for community-acquired pneumonia. In the emergency department she was noted to have fever 102, and was tachycardic, with lactic acidosis to 6.3. She was treated with IVF, vanc, and zoysn and family medicine was called for admission. CXR was concerning for possible pneumonia. However, due to her high needs for suctioning, as well as severe sepsis, PCCM was called for admission. 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  PAST SURGICAL HISTORY: She  has a past surgical history that includes PEG tube placement; PEG placement (09/03/2011); PEG placement (02/19/2012); PEG placement (N/A, 05/30/2013); and PEG placement (N/A, 07/03/2013).  Allergies  Allergen Reactions  . Doxycycline Rash and Other (See Comments)    REACTION: Blisters / swelling  . Other Other (See  Comments)    Seasonal Allergies      No current facility-administered medications on file prior to encounter.    Current Outpatient Prescriptions on File Prior to Encounter  Medication Sig  . diazepam (DIASTAT ACUDIAL) 10 MG GEL Place 5 mg rectally once.  Marland Kitchen guaiFENesin 200 MG tablet Take 1 tablet (200 mg total) by mouth every 4 (four) hours as needed for cough or to loosen phlegm. (Patient not taking: Reported on 01/02/2016)  . lacosamide (VIMPAT) 10 MG/ML oral solution GIVE 5 ML BY MOUTH EVERY MORNING AND 5 ML BY MOUTH IN THE EVENING (Patient taking differently: Take 50 mg by mouth 2 (two) times daily. )  . levETIRAcetam (KEPPRA) 100 MG/ML solution GIVE 12.5 MLS IN THE MORNING AND 12.5 MLS IN THE EVENING BY PEG TUBE (Patient taking differently: Place 1,250 mg into feeding tube 2 (two) times daily. )  . levothyroxine (SYNTHROID, LEVOTHROID) 88 MCG tablet TAKE 1 TABLET (88 MCG TOTAL) BY MOUTH DAILY.  Marland Kitchen loratadine (CLARITIN) 5 MG/5ML syrup Place 10 mLs (10 mg total) into feeding tube daily.  . Nutritional Supplements (JEVITY) LIQD 1 Can by Per J Tube route 3 (three) times daily. (Patient not taking: Reported on 01/02/2016)  . ranitidine (ZANTAC) 75 MG/5ML syrup TAKE 5 MLS (75 MG TOTAL) BY PER J TUBE ROUTE 2 (TWO) TIMES DAILY.  . Valproate Sodium (DEPAKENE) 250 MG/5ML SOLN solution Give 84ml by g-tube every 6 hours    FAMILY HISTORY:  Her indicated that her mother is alive. She indicated that her father is alive. She indicated that her brother is alive. She indicated that her maternal grandmother is alive. She indicated that her maternal grandfather is deceased. She reported the  following about her paternal grandmother: Unknown.She reported the following about her paternal grandfather: Unknown.She indicated that the status of her maternal aunt is unknown. She indicated that the status of her other is unknown.    SOCIAL HISTORY: She  reports that she has never smoked. She has never used smokeless  tobacco. She reports that she does not drink alcohol or use drugs.  REVIEW OF SYSTEMS:   Unable to obtain  SUBJECTIVE:  Awake, breathing comfortably.  VITAL SIGNS: BP 98/71   Pulse (!) 154   Temp 102.1 F (38.9 C) (Rectal)   Resp 25   Wt 106 lb 14.8 oz (48.5 kg)   SpO2 96%   BMI 21.60 kg/m   HEMODYNAMICS:    VENTILATOR SETTINGS:    INTAKE / OUTPUT: No intake/output data recorded.  PHYSICAL EXAMINATION: General:  Awake, but non-responsive (baseline per grandmother) Neuro:  Non-responsive, spastic changes in all extremities HEENT:  Enlarged, protruding tongue. Disconjugate gaze. Cardiovascular:  Tachycardic, but normal heart soudns Lungs:  Rhonchi Abdomen:  Soft Musculoskeletal:  Spatic changes, but no apparent injuries Skin:  No rashes on visible skin  LABS:  BMET  Recent Labs Lab 02/22/16 1915  NA 136  K 4.6  CL 97*  CO2 28  BUN 20  CREATININE 1.26*  GLUCOSE 134*    Electrolytes  Recent Labs Lab 02/22/16 1915  CALCIUM 9.2    CBC  Recent Labs Lab 02/22/16 1915  WBC 7.5  HGB 11.8*  HCT 38.4  PLT 215    Coag's No results for input(s): APTT, INR in the last 168 hours.  Sepsis Markers  Recent Labs Lab 02/22/16 1927 02/22/16 2343  LATICACIDVEN 6.29* 3.36*    ABG  Recent Labs Lab 02/23/16 0022  PHART 7.407  PCO2ART 39.5  PO2ART 57.0*    Liver Enzymes  Recent Labs Lab 02/22/16 1915  AST 36  ALT 19  ALKPHOS 105  BILITOT 0.4  ALBUMIN 3.2*    Cardiac Enzymes No results for input(s): TROPONINI, PROBNP in the last 168 hours.  Glucose No results for input(s): GLUCAP in the last 168 hours.  Imaging Dg Chest 2 View  Result Date: 02/22/2016 CLINICAL DATA:  Cough. EXAM: CHEST  2 VIEW COMPARISON:  Radiographs of December 31, 2015. FINDINGS: The heart size and mediastinal contours are within normal limits. Left lung is clear. Possible mild right posterior basilar opacity is noted. No pneumothorax or pleural effusion is  noted. The visualized skeletal structures are unremarkable. IMPRESSION: Possible mild right posterior basilar opacity is noted concerning for atelectasis or infiltrate. Electronically Signed   By: Marijo Conception, M.D.   On: 02/22/2016 20:52     STUDIES:  CXR: Resolving infiltrates compared to prior, possible infiltrate on posterior segment of R lobe.  CULTURES: Blood 10/29 >> pending Sputum, ordered  ANTIBIOTICS: Vanc 10/28 >> Zosyn 10/28 Cefepime 10/29 >>  SIGNIFICANT EVENTS: None  LINES/TUBES: PIV x2 22ga (in hand)  DISCUSSION: 35 y/o woman with severe CP presenting with severe sepsis; pneumonia as possible source, although Xray is unconvincing  ASSESSMENT / PLAN:  PULMONARY A: Sepsis of pulmonary origin Hypoxia P:   Hypoxia likely due to sepsis CXR does not show large infiltrate; may have component of tracheobronchitis as well. Needs HCAP coverage given recent ABX, which can pre-dispose to HCAP.  CARDIOVASCULAR A:  Tachycardia P:  Baseline neurologic condition raises concern for autonomic dysfunction  RENAL A:   AKI Lactic acidosis, resolving P:   Due to sepsis, will follow  GASTROINTESTINAL  A:   Need for tube feeds P:   Consult placed to dietary for tube feeds   HEMATOLOGIC A:   Lovenox ppx ordered P:   INFECTIOUS A:   Probable HCAP, possible other sources P:   F/u blood, sputum, and urine cx. Tx broadly for now.  ENDOCRINE A:   Hypothyroidism P:   Home synthroid  NEUROLOGIC A:   CP with seizures P:   Home AEDs ordered F/u Depakote level  RASS goal: 0   FAMILY  - Updates: updated on admission  - Inter-disciplinary family meet or Palliative Care meeting due by:  03/01/16   CRITICAL CARE Performed by: Luz Brazen   Total critical care time: 60 minutes  Critical care time was exclusive of separately billable procedures and treating other patients.  Critical care was necessary to treat or prevent imminent or  life-threatening deterioration.  Critical care was time spent personally by me on the following activities: development of treatment plan with patient and/or surrogate as well as nursing, discussions with consultants, evaluation of patient's response to treatment, examination of patient, obtaining history from patient or surrogate, ordering and performing treatments and interventions, ordering and review of laboratory studies, ordering and review of radiographic studies, pulse oximetry and re-evaluation of patient's condition.   Luz Brazen, MD Pulmonary and [place] Pager: [phone]  02/23/2016, 1:14 AM

## 2016-02-24 ENCOUNTER — Ambulatory Visit: Payer: Medicaid Other | Admitting: Family Medicine

## 2016-02-24 ENCOUNTER — Inpatient Hospital Stay (HOSPITAL_COMMUNITY): Payer: Medicaid Other

## 2016-02-24 DIAGNOSIS — G8250 Quadriplegia, unspecified: Secondary | ICD-10-CM

## 2016-02-24 DIAGNOSIS — F72 Severe intellectual disabilities: Secondary | ICD-10-CM

## 2016-02-24 DIAGNOSIS — G809 Cerebral palsy, unspecified: Secondary | ICD-10-CM

## 2016-02-24 DIAGNOSIS — J189 Pneumonia, unspecified organism: Secondary | ICD-10-CM

## 2016-02-24 DIAGNOSIS — R0902 Hypoxemia: Secondary | ICD-10-CM

## 2016-02-24 DIAGNOSIS — R1319 Other dysphagia: Secondary | ICD-10-CM

## 2016-02-24 DIAGNOSIS — E039 Hypothyroidism, unspecified: Secondary | ICD-10-CM

## 2016-02-24 DIAGNOSIS — H47619 Cortical blindness, unspecified side of brain: Secondary | ICD-10-CM

## 2016-02-24 DIAGNOSIS — G40309 Generalized idiopathic epilepsy and epileptic syndromes, not intractable, without status epilepticus: Secondary | ICD-10-CM

## 2016-02-24 DIAGNOSIS — I10 Essential (primary) hypertension: Secondary | ICD-10-CM

## 2016-02-24 DIAGNOSIS — R000 Tachycardia, unspecified: Secondary | ICD-10-CM

## 2016-02-24 LAB — BASIC METABOLIC PANEL
Anion gap: 4 — ABNORMAL LOW (ref 5–15)
BUN: 12 mg/dL (ref 6–20)
CHLORIDE: 109 mmol/L (ref 101–111)
CO2: 25 mmol/L (ref 22–32)
Calcium: 8.9 mg/dL (ref 8.9–10.3)
Creatinine, Ser: 0.98 mg/dL (ref 0.44–1.00)
GFR calc Af Amer: >60 mL/min (ref >60)
GFR calc non Af Amer: >60 mL/min (ref >60)
GLUCOSE: 71 mg/dL (ref 65–99)
POTASSIUM: 4.4 mmol/L (ref 3.5–5.1)
Sodium: 138 mmol/L (ref 135–145)

## 2016-02-24 LAB — CBC
HEMATOCRIT: 27.5 % — AB (ref 36.0–46.0)
HEMOGLOBIN: 8.3 g/dL — AB (ref 12.0–15.0)
MCH: 32.7 pg (ref 26.0–34.0)
MCHC: 30.2 g/dL (ref 30.0–36.0)
MCV: 108.3 fL — AB (ref 78.0–100.0)
PLATELETS: 146 10*3/uL — AB (ref 150–400)
RBC: 2.54 MIL/uL — AB (ref 3.87–5.11)
RDW: 18.3 % — ABNORMAL HIGH (ref 11.5–15.5)
WBC: 12.2 10*3/uL — ABNORMAL HIGH (ref 4.0–10.5)

## 2016-02-24 LAB — GLUCOSE, CAPILLARY
Glucose-Capillary: 67 mg/dL (ref 65–99)
Glucose-Capillary: 127 mg/dL — ABNORMAL HIGH (ref 65–99)

## 2016-02-24 LAB — PHOSPHORUS: Phosphorus: 3.3 mg/dL (ref 2.5–4.6)

## 2016-02-24 LAB — URINE CULTURE: Culture: NO GROWTH

## 2016-02-24 LAB — MAGNESIUM: Magnesium: 2.3 mg/dL (ref 1.7–2.4)

## 2016-02-24 LAB — LACTIC ACID, PLASMA: Lactic Acid, Venous: 0.6 mmol/L (ref 0.5–1.9)

## 2016-02-24 MED ORDER — SODIUM CHLORIDE 0.9% FLUSH
10.0000 mL | INTRAVENOUS | Status: DC | PRN
  Administered 2016-02-25: 10 mL
  Filled 2016-02-24: qty 40

## 2016-02-24 MED ORDER — DEXTROSE 50 % IV SOLN
INTRAVENOUS | Status: AC
  Filled 2016-02-24: qty 50

## 2016-02-24 MED ORDER — SODIUM CHLORIDE 0.9% FLUSH
10.0000 mL | Freq: Two times a day (BID) | INTRAVENOUS | Status: DC
  Administered 2016-02-24 – 2016-02-25 (×3): 10 mL

## 2016-02-24 MED ORDER — HYDRALAZINE HCL 20 MG/ML IJ SOLN
2.0000 mg | Freq: Four times a day (QID) | INTRAMUSCULAR | Status: DC | PRN
  Administered 2016-02-24: 2 mg via INTRAVENOUS
  Filled 2016-02-24: qty 1

## 2016-02-24 MED ORDER — JEVITY 1.2 CAL PO LIQD
1000.0000 mL | ORAL | Status: DC
  Administered 2016-02-24 – 2016-02-26 (×3): 1000 mL
  Filled 2016-02-24 (×7): qty 1000

## 2016-02-24 MED ORDER — ORAL CARE MOUTH RINSE
15.0000 mL | Freq: Two times a day (BID) | OROMUCOSAL | Status: DC
  Administered 2016-02-25 – 2016-02-27 (×5): 15 mL via OROMUCOSAL

## 2016-02-24 MED ORDER — DEXTROSE 50 % IV SOLN
25.0000 mL | Freq: Once | INTRAVENOUS | Status: AC
  Administered 2016-02-24: 25 mL via INTRAVENOUS

## 2016-02-24 MED ORDER — CHLORHEXIDINE GLUCONATE 0.12 % MT SOLN
15.0000 mL | Freq: Two times a day (BID) | OROMUCOSAL | Status: DC
  Administered 2016-02-24 – 2016-02-27 (×6): 15 mL via OROMUCOSAL
  Filled 2016-02-24 (×4): qty 15

## 2016-02-24 NOTE — Progress Notes (Signed)
Pt was receiving CPT through the bed and then had several sustained runs of V-tach. CPT was discontinued immediately and pt returned to sinus rhythm. RT will continue to monitor and hold CPT.

## 2016-02-24 NOTE — Progress Notes (Signed)
Peripherally Inserted Central Catheter/Midline Placement  The IV Nurse has discussed with the patient and/or persons authorized to consent for the patient, the purpose of this procedure and the potential benefits and risks involved with this procedure.  The benefits include less needle sticks, lab draws from the catheter, and the patient may be discharged home with the catheter. Risks include, but not limited to, infection, bleeding, blood clot (thrombus formation), and puncture of an artery; nerve damage and irregular heartbeat and possibility to perform a PICC exchange if needed/ordered by physician.  Alternatives to this procedure were also discussed.  Bard Power PICC patient education guide, fact sheet on infection prevention and patient information card has been provided to patient /or left at bedside.  Consent obtained via telephone from grandmother, [name], due to altered mental status.  PICC/Midline Placement Documentation        Brewer Hitchman, Nicolette Bang 02/24/2016, 12:22 PM

## 2016-02-24 NOTE — Progress Notes (Signed)
Initial Nutrition Assessment  DOCUMENTATION CODES:   Not applicable  INTERVENTION:    Initiate TF via J-tube with Jevity 1.2 at goal rate of 45 ml/h (1080 ml per day) to provide 1296 kcals, 60 gm protein, 875 ml free water daily.  NUTRITION DIAGNOSIS:   Inadequate oral intake related to inability to eat as evidenced by NPO status.  GOAL:   Patient will meet greater than or equal to 90% of their needs  MONITOR:   Labs, Weight trends, TF tolerance, I & O's  REASON FOR ASSESSMENT:   Consult Enteral/tube feeding initiation and management  ASSESSMENT:   35 y/o woman with severe CP presenting with severe sepsis; pneumonia as possible source, although Xray is unconvincing.  Lactic acid clearing and BP responding to IVF.  Patient is DNR.  Unable to see patient or complete nutrition focused physical exam at this time; patient is having a PICC line placed.  Per discussion with MD, patient has a J-tube. Unsure of usual TF regimen. Mom is currently at work, unavailable to answer questions.  Currently on nasal cannula.  Weight appears to be stable for the past few years per review of usual weights in medical record.  Diet Order:  Diet NPO time specified  Skin:  Reviewed, no issues  Last BM:  10/29  Height:   Ht Readings from Last 1 Encounters:  02/23/16 5' (1.524 m)    Weight:   Wt Readings from Last 1 Encounters:  02/24/16 109 lb 9.1 oz (49.7 kg)    Ideal Body Weight:  45.5 kg  BMI:  Body mass index is 21.4 kg/m.  Estimated Nutritional Needs:   Kcal:  1200-1400  Protein:  60-70 gm  Fluid:  1.5 L  EDUCATION NEEDS:   No education needs identified at this time  Molli Barrows, [place], [place], [place] Pager [phone] After Hours Pager [phone]

## 2016-02-24 NOTE — Discharge Summary (Signed)
[facility] Discharge Summary  Patient name: Megan [name] Medical record number: [id number] Date of birth: 12/20/1980 Age: 35 y.o. 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[place])   Spastic quadriparesis ([place])   Dysphagia   Cortical blindness   Pneumonia   Hypoxia   Tachycardia   Disposition: Home  Discharge Condition: Stable, improved  Discharge Exam:  Temp:  [98.9 F (37.2 C)-99.2 F (37.3 C)] 99.2 F (37.3 C) (11/01 2112) Pulse Rate:  [102-106] 106 (11/01 2114) Resp:  [17] 17 (11/01 2112) BP: (116-143)/(63-97) 143/97 (11/01 2112) SpO2:  [98 %-100 %] 98 % (11/01 2114) Weight:  [50.8 kg (112 lb)] 50.8 kg (112 lb) (11/02 0533) Physical Exam: General: chronically ill patient with CP rests in bed, NAD Cardiovascular: RRR no m/r/g Respiratory: CTA bil, no W/R/R, poor inspiratory effort Abdomen: soft and nontender, nondistended Extremities: poorly developed, SCDs in place, no LE edema  Brief Hospital Course:  35yo F with cerebral palsy (non-verbal at baseline) presented with cough and difficulty breathing at home.  She was reportedly well until the morning of admission when she developed cough productive of clear sputum and dyspnea.  Temperature at home was 99.  She presented to [facility] and was found to be febrile to 102 and tachycardic with lactic acidosis to 6.3.  The patient was given IVF, vanc/zosyn, required significant suctioning from a respiratory standpoint, as well.  She was  admitted to PCCM  from 02/22/16 until 02/24/2016 during which time she was stable on high flow oxygen and continued on broad-spectrum antibiotics, weaned to nasal cannula and transferred to the family medicine service on a general med-surg floor. She was started on G-tube feeds with Jevity 1.2 at goal rate of 45 mL/h to provide 1296 kcals.  Ultimately her oxygen status improved and she was discharged on room air and PO antibiotics for a 10 day course.   Issues for Follow Up:  1. Macrocytic Anemia: Patient noted to have macrocytic anemia during this admission.  B12 elevated, folate pending at discharge.  Please follow up this lab at hospital follow up. 2. HCAP: Patient discharged with omnicef 300 mg PO BID to finish out a 10 day course (through 03/02/2016)  Significant Procedures: None  Significant Labs and Imaging:   Recent Labs Lab 02/25/16 0140 02/26/16 0453 02/27/16 0530  WBC 10.4 7.6 8.5  HGB 8.5* 8.2* 8.6*  HCT 27.7* 26.2* 27.2*  PLT PLATELET CLUMPS NOTED ON SMEAR, COUNT APPEARS DECREASED 149* 180    Recent Labs Lab 02/22/16 1915 02/23/16 0512 02/24/16 0237 02/26/16 0453 02/27/16 0530  NA 136 140 138 142 141  K 4.6 4.4 4.4 3.6 3.8  CL 97* 112* 109 107 106  CO2 28 22 25 30 31   GLUCOSE 134* 105* 71 131* 111*  BUN 20 14 12 11 10   CREATININE 1.26* 1.00 0.98 0.85 0.76  CALCIUM 9.2 8.2* 8.9 8.6* 8.8*  MG  --  1.6* 2.3  --   --   PHOS  --  2.8 3.3  --   --   ALKPHOS 105  --   --   --   --  AST 36  --   --   --   --   ALT 19  --   --   --   --   ALBUMIN 3.2*  --   --   --   --     Dg Chest 2 View  Result Date: 02/25/2016 CLINICAL DATA:  Hypoxia EXAM: CHEST  2 VIEW COMPARISON:  02/24/2016 FINDINGS: A right upper extremity catheter tip overlies the SVC. Low lung volumes with elevated right diaphragm. Tiny left pleural effusion. Small moderate right pleural effusion suspected. Hazy right lower lobe atelectasis or infiltrate. Patchy atelectasis or infiltrates in the left lung  base. Cardiomediastinal silhouette is nonenlarged. No pneumothorax. Gastrostomy tube in left upper quadrant. IMPRESSION: 1. Elevated right diaphragm with low lung volumes. Bilateral pleural effusions, small on the left and small moderate on the right. 2. Increasing atelectasis, infiltrate or edema and the right lung base. 3. Patchy atelectasis or inflammatory infiltrates within the left lung base. Electronically Signed   By: Donavan Foil M.D.   On: 02/25/2016 16:36   Dg Chest Port 1 View  Result Date: 02/24/2016 CLINICAL DATA:  Hypoxia EXAM: PORTABLE CHEST 1 VIEW COMPARISON:  PA and lateral chest x-ray of February 22, 2016 FINDINGS: There is new increased density at the light right lung base. Subtle increased density projects over the mid and lower left lung as well. There is partial obscuration of the right hemidiaphragm. The left hemidiaphragm remains visible. The heart is normal in size. The pulmonary vascularity is mildly engorged and indistinct. The bony thorax exhibits no acute abnormality where visualized. IMPRESSION: Interval development of atelectasis or pneumonia at the right lung base. There is a probable small right pleural effusion. Mild interstitial prominence bilaterally may reflect interstitial edema or developing interstitial pneumonia. A PA and lateral chest x-ray with deep inspiratory effort would be useful when the patient can tolerate the procedure. Electronically Signed   By: David  Martinique M.D.   On: 02/24/2016 10:43    Results/Tests Pending at Time of Discharge: folate  Discharge Medications:    Medication List    TAKE these medications   cefdinir 125 MG/5ML suspension Commonly known as:  OMNICEF Take 12 mLs (300 mg total) by mouth 2 (two) times daily.   DELSYM PO Place 12.5 mLs into feeding tube every 12 (twelve) hours as needed (cough).   diazepam 10 MG Gel Commonly known as:  DIASTAT ACUDIAL Place 5 mg rectally once.   guaiFENesin 200 MG tablet Take 1 tablet (200  mg total) by mouth every 4 (four) hours as needed for cough or to loosen phlegm.   ibuprofen 100 MG/5ML suspension Commonly known as:  ADVIL,MOTRIN Place 240 mg into feeding tube every 8 (eight) hours as needed for fever. 12 ml   feeding supplement (JEVITY 1.2 CAL) Liqd Place 574 mLs into feeding tube See admin instructions. Give 2 cans (574 mls) per tube at 40 ml/hr daily   JEVITY Liqd 1 Can by Per J Tube route 3 (three) times daily.   lacosamide 10 MG/ML oral solution Commonly known as:  VIMPAT GIVE 5 ML BY MOUTH EVERY MORNING AND 5 ML BY MOUTH IN THE EVENING What changed:  how much to take  when to take this  additional instructions   levETIRAcetam 100 MG/ML solution Commonly known as:  KEPPRA GIVE 12.5 MLS IN THE MORNING AND 12.5 MLS IN THE EVENING BY PEG TUBE What changed:  how much to take  how to take this  when to  take this  additional instructions   levothyroxine 88 MCG tablet Commonly known as:  SYNTHROID, LEVOTHROID TAKE 1 TABLET (88 MCG TOTAL) BY MOUTH DAILY. What changed:  See the new instructions.   loratadine 5 MG/5ML syrup Commonly known as:  CLARITIN Place 10 mLs (10 mg total) into feeding tube daily. What changed:  when to take this  reasons to take this   ranitidine 75 MG/5ML syrup Commonly known as:  ZANTAC TAKE 5 MLS (75 MG TOTAL) BY PER J TUBE ROUTE 2 (TWO) TIMES DAILY.   Valproate Sodium 250 MG/5ML Soln solution Commonly known as:  DEPAKENE Give 71ml by g-tube every 6 hours What changed:  how much to take  how to take this  when to take this  additional instructions       Discharge Instructions: Please refer to Patient Instructions section of EMR for full details.  Patient was counseled important signs and symptoms that should prompt return to medical care, changes in medications, dietary instructions, activity restrictions, and follow up appointments.   Follow-Up Appointments: Follow-up Information    [employer]. Go on 03/02/2016.   Why:  Please come to your appointment for hospital follow up at 2:45 on 11/6. Please arrive 15 minutes prior to your appointment. Contact information: [address]          Everrett Coombe, MD 02/27/2016, 3:34 PM PGY-1, [place]

## 2016-02-24 NOTE — Progress Notes (Signed)
Hypoglycemic Event  CBG: 67  Treatment: D50 IV 25 mL  Symptoms: None  Follow-up CBG: Time:2042 CBG Result:127  Possible Reasons for Event: Inadequate meal intake  Comments/MD notified:Dr. Rosalyn Gess via text page    Dillard [place]

## 2016-02-24 NOTE — Care Management Note (Signed)
Case Management Note  Patient Details  Name: Megan [name] MRN: [id number] Date of Birth: [birth year]  Subjective/Objective:   Pt admitted with HCAP                 Action/Plan:  PTA from home with grandmother.  Pt has CP and is non-communitive at baseline.  CM will continue to follow for discharge needs   Expected Discharge Date:                  Expected Discharge Plan:     In-House Referral:     Discharge planning Services  CM Consult  Post Acute Care Choice:    Choice offered to:     DME Arranged:    DME Agency:     HH Arranged:    HH Agency:     Status of Service:  In process, will continue to follow  If discussed at Long Length of Stay Meetings, dates discussed:    Additional Comments:  Maryclare Labrador, RN 02/24/2016, 9:32 AM

## 2016-02-24 NOTE — Progress Notes (Signed)
Family Medicine Teaching Service Daily Progress Note Intern Pager: [phone]  Patient name: Megan [name] Medical record number: [id number] Date of birth: Feb 11, 1981 Age: 35 y.o. Gender: female  Primary Care Provider: Georges Lynch, MD Consultants: CCM (signed off) Code Status: DNR  Pt Overview and Major Events to Date:  1. Admitted to CCM 2. Transferred to FMTS on 10/30  Assessment and Plan:  Sepsis, resolving:  Patient presented lactic acid >6, fevers to 102, tachycardia and tachypnea and found to have probable PNA on CXR.  Was recently admitted in September for PNA.  High degree of secretions, requiring frequent suctioning and was admitted to ICU.  Lactate trending downward to 2.7.  - continue NS IVF @ 75cc/hr - f/u repeat lactic acid  ?HCAP:  Patient being treated for probable PNA with cefepime and vancomycin.  Requiring high-flow o2 30L/min and 40% FIO2 and satting 98%.  Afebrile for >24 hrs.  Aside from some baseline tachycardia, VSS.  Patient had CPT last night and subsequently developed sustained VTACH and this converted to NSR after stopping.  Resolving sepsis. Poor IV access.  Currently has line in finger and foot.  Put in order for PICC placement.  Diffuse ronchi and increased upper airway sounds.  - f/u picc  - continue cefepime/vanc (total day 3 abx, will continue for 10-14 days) - consider repeating CXR  - continue CPT/guaifenesin - titrate O2 for sats 88-92% - f/u bcx, ucx and sputum cx  Tachycardia: Baseline tachycardia to one-teens and overnight was between 102-133.  Afebrile for >24 hrs.  - continue on tele - vital signs per floor protocol  AKI, resolved:  Likely due to sepsis, improved with fluids.  - continue to monitor  J-tube:  No complications at home.  Consulting inpatient nutrition for feeds. Receives javity 2 cans @40cc /hr daily - awaiting nutrition recommendations  Hypothyroid:  TSH WNL on 9/17.  - continue synthroid   Infantile cerebral palsy: No  recent seizure activity. - continue keppra  FEN/GI: NS @75cc /hr/ Tube feeds PPx: Lovenox  Disposition: transfer from ICU to SDU  Subjective:  Patient non-verbal at baseline, appears comfortable.  Spoke with ICU nurse who stated that patient is doing well.  She does have questionable IV access in her finger and IV team unable to place any lines even with [place].    Objective: Temp:  [98.1 F (36.7 C)-99.9 F (37.7 C)] 98.6 F (37 C) (10/30 0300) Pulse Rate:  [99-138] 119 (10/30 0600) Resp:  [9-36] 20 (10/30 0600) BP: (94-169)/(13-106) 127/89 (10/30 0600) SpO2:  [91 %-100 %] 98 % (10/30 0600) FiO2 (%):  [50 %-100 %] 50 % (10/30 0415) Weight:  [109 lb 9.1 oz (49.7 kg)] 109 lb 9.1 oz (49.7 kg) (10/30 0355) Physical Exam: General: 35yo F, non-verbal and blind, but awake and sitting up in bed with high-flow [place] appearing comfortable Cardiovascular: RRR, no murmurs Respiratory: Diffuse rhonci Abdomen: soft Extremities: no edema MSK: spastic changes Skin: no rashes  Laboratory:  Recent Labs Lab 02/22/16 1915 02/23/16 0632 02/24/16 0237  WBC 7.5 7.1 12.2*  HGB 11.8* 9.1* 8.3*  HCT 38.4 29.9* 27.5*  PLT 215 142* 146*    Recent Labs Lab 02/22/16 1915 02/23/16 0512 02/24/16 0237  NA 136 140 138  K 4.6 4.4 4.4  CL 97* 112* 109  CO2 28 22 25   BUN 20 14 12   CREATININE 1.26* 1.00 0.98  CALCIUM 9.2 8.2* 8.9  PROT 8.9*  --   --   BILITOT 0.4  --   --  ALKPHOS 105  --   --   ALT 19  --   --   AST 36  --   --   GLUCOSE 134* 105* 71    Imaging/Diagnostic Tests: Dg Chest 2 View  Result Date: 02/22/2016 CLINICAL DATA:  Cough. EXAM: CHEST  2 VIEW COMPARISON:  Radiographs of December 31, 2015. FINDINGS: The heart size and mediastinal contours are within normal limits. Left lung is clear. Possible mild right posterior basilar opacity is noted. No pneumothorax or pleural effusion is noted. The visualized skeletal structures are unremarkable. IMPRESSION: Possible mild right  posterior basilar opacity is noted concerning for atelectasis or infiltrate. Electronically Signed   By: Marijo Conception, M.D.   On: 02/22/2016 20:52   Eloise Levels, MD 02/24/2016, 6:52 AM PGY-1, [place] Intern pager: [phone], text pages welcome

## 2016-02-24 NOTE — Procedures (Signed)
Pt placed on salter HFNC at this time tolerating well, will continue to wean fio2 down as tolerated.

## 2016-02-25 ENCOUNTER — Inpatient Hospital Stay (HOSPITAL_COMMUNITY): Payer: Medicaid Other

## 2016-02-25 LAB — CBC WITH DIFFERENTIAL/PLATELET
BAND NEUTROPHILS: 0 %
BASOS PCT: 0 %
Basophils Absolute: 0 10*3/uL (ref 0.0–0.1)
Blasts: 0 %
EOS ABS: 0.2 10*3/uL (ref 0.0–0.7)
EOS PCT: 2 %
HCT: 27.7 % — ABNORMAL LOW (ref 36.0–46.0)
Hemoglobin: 8.5 g/dL — ABNORMAL LOW (ref 12.0–15.0)
LYMPHS PCT: 26 %
Lymphs Abs: 2.7 10*3/uL (ref 0.7–4.0)
MCH: 32.4 pg (ref 26.0–34.0)
MCHC: 30.7 g/dL (ref 30.0–36.0)
MCV: 105.7 fL — AB (ref 78.0–100.0)
MONO ABS: 1.4 10*3/uL — AB (ref 0.1–1.0)
MONOS PCT: 13 %
Metamyelocytes Relative: 0 %
Myelocytes: 0 %
NEUTROS ABS: 6.1 10*3/uL (ref 1.7–7.7)
NEUTROS PCT: 59 %
NRBC: 0 /100{WBCs}
OTHER: 0 %
PLATELETS: DECREASED 10*3/uL (ref 150–400)
PROMYELOCYTES ABS: 0 %
RBC: 2.62 MIL/uL — ABNORMAL LOW (ref 3.87–5.11)
RDW: 17.9 % — ABNORMAL HIGH (ref 11.5–15.5)
WBC: 10.4 10*3/uL (ref 4.0–10.5)

## 2016-02-25 LAB — GLUCOSE, CAPILLARY
GLUCOSE-CAPILLARY: 101 mg/dL — AB (ref 65–99)
GLUCOSE-CAPILLARY: 112 mg/dL — AB (ref 65–99)
GLUCOSE-CAPILLARY: 117 mg/dL — AB (ref 65–99)
GLUCOSE-CAPILLARY: 118 mg/dL — AB (ref 65–99)
GLUCOSE-CAPILLARY: 133 mg/dL — AB (ref 65–99)
GLUCOSE-CAPILLARY: 86 mg/dL (ref 65–99)

## 2016-02-25 NOTE — Progress Notes (Signed)
Patient arrived to unit. Alert, but nonverbal at baseline. Patient's mouth suctioned r/t secretion build up in mouth. Bed alarm placed. No family present at bedside. VSS. J tube currently infusing with no complications.

## 2016-02-25 NOTE — Progress Notes (Signed)
Family Medicine Teaching Service Daily Progress Note Intern Pager: [phone]  Patient name: Megan [name] Medical record number: [id number] Date of birth: 1981/01/19 Age: 35 y.o. Gender: female  Primary Care Provider: Georges Lynch, MD Consultants: CCM (signed off) Code Status: DNR  Pt Overview and Major Events to Date:  1. Admitted to CCM 2. Transferred to FMTS on 10/30 3. Transfer to med-surg on 10/31  Assessment and Plan:  Sepsis, resolved:  Patient presented lactic acid >6, fevers to 102, tachycardia and tachypnea and found to have probable PNA on CXR.  Afebrile for >24 hrs and lactate acid 0.6 yesterday.  - KVO'd fluids  ?HCAP:  Patient being treated for probable PNA with cefepime and vancomycin. Satting well on 2LNC at 100%. Afebrile for >24 hrs.  Aside from some baseline tachycardia, VSS. PICC placed yesterday due to poor IV access. Diffuse ronchi and increased upper airway sounds.  Requiring infrequent suctioning per ICU nurse. Orders in for q4hrs mouth care. Ok to be transferred to [facility] with tele discontinued and fluids KVO'd.  Plan to transition to PO abx tomorrow if respiratory status continues to improve.  - continue cefepime/vanc (total day 4 abx, will continue for 10-14 days) - continue CPT/guaifenesin  - titrate O2 for sats 88-92% - f/u bcx, ucx and sputum cx - f/u 2view CXR  Tachycardia: Baseline tachycardia to one-teens and overnight was between 102-133.  Afebrile for >24 hrs and stable.  - discontinue telemetry - vital signs per floor protocol  AKI, resolved:  Likely due to sepsis, improved with fluids.  - continue to monitor  J-tube:  Receiving tube feeds without complications. Did have one hypoglycemic episode to 67 and improved to 120s with 1/2 amp d50.  At 40cc/hr, almost at 45cc goal.  - continue feeds  Hypothyroid:  TSH WNL on 9/17.  - continue synthroid   Infantile cerebral palsy: No recent seizure activity. - continue keppra  FEN/GI: NS @75cc /hr/  Tube feeds PPx: Lovenox  Disposition: transfer from SDU to med-surg   Subjective:  Patient non-verbal at baseline, appears comfortable.    Objective: Temp:  [98 F (36.7 C)-98.8 F (37.1 C)] 98.2 F (36.8 C) (10/31 0819) Pulse Rate:  [76-132] 101 (10/31 0804) Resp:  [15-28] 19 (10/31 0804) BP: (109-198)/(43-110) 122/83 (10/31 0804) SpO2:  [95 %-100 %] 99 % (10/31 0804) FiO2 (%):  [30 %-40 %] 30 % (10/30 1235) Weight:  [106 lb 14.8 oz (48.5 kg)-107 lb (48.5 kg)] 107 lb (48.5 kg) (10/31 1055) Physical Exam: General: 35yo F, non-verbal and blind, but awake and sitting up in bed with 2L [place] appearing comfortable Cardiovascular: RRR, no murmurs Respiratory: Diffuse rhonci Abdomen: soft Extremities: no edema MSK: spastic changes Skin: no rashes  Laboratory:  Recent Labs Lab 02/23/16 0632 02/24/16 0237 02/25/16 0140  WBC 7.1 12.2* 10.4  HGB 9.1* 8.3* 8.5*  HCT 29.9* 27.5* 27.7*  PLT 142* 146* PLATELET CLUMPS NOTED ON SMEAR, COUNT APPEARS DECREASED    Recent Labs Lab 02/22/16 1915 02/23/16 0512 02/24/16 0237  NA 136 140 138  K 4.6 4.4 4.4  CL 97* 112* 109  CO2 28 22 25   BUN 20 14 12   CREATININE 1.26* 1.00 0.98  CALCIUM 9.2 8.2* 8.9  PROT 8.9*  --   --   BILITOT 0.4  --   --   ALKPHOS 105  --   --   ALT 19  --   --   AST 36  --   --   GLUCOSE 134* 105*  71    Imaging/Diagnostic Tests: Dg Chest 2 View  Result Date: 02/22/2016 CLINICAL DATA:  Cough. EXAM: CHEST  2 VIEW COMPARISON:  Radiographs of December 31, 2015. FINDINGS: The heart size and mediastinal contours are within normal limits. Left lung is clear. Possible mild right posterior basilar opacity is noted. No pneumothorax or pleural effusion is noted. The visualized skeletal structures are unremarkable. IMPRESSION: Possible mild right posterior basilar opacity is noted concerning for atelectasis or infiltrate. Electronically Signed   By: Marijo Conception, M.D.   On: 02/22/2016 20:52   Eloise Levels,  MD 02/25/2016, 11:09 AM PGY-1, [place] Intern pager: [phone], text pages welcome

## 2016-02-25 NOTE — Progress Notes (Signed)
Report called to receiving RN. Bed changed. Peri care provided. Patient transported via bed with CNA.

## 2016-02-25 NOTE — Progress Notes (Signed)
Family Medicine Teaching Service Daily Progress Note Intern Pager: [phone]  Patient name: Megan [name] Medical record number: [id number] Date of birth: [birth year] Age: 35 y.o. Gender: female  Primary Care Provider: Georges Lynch, MD Consultants: CCM (signed off) Code Status: DNR  Pt Overview and Major Events to Date:  1. Admitted to CCM 2. Transferred to FMTS on 10/30 3. Transfer to med-surg on 10/31   Assessment and Plan: 35 year old female who presented with sepsis secondary to pneumonia and a new oxygen requirement.  Likely HCAP, Stable:  Afebrile >24h and satting well on 2L [place]. Patient now on day #5 antibiotics.  Aside from some baseline tachycardia, VSS. PICC in place. Diffuse ronchi and increased upper airway sounds. Rec'd 1d zosyn (10/28) - 2 view CXR (10/31) with bilateral pleural effusions, increasing atelectasis, infiltrate or edema at R lung base, and patchy atelectasis or infiltrates at  L lung base. - blood cultures NG x 3d, urine culture NG (final), sputum culture not collected - cefepime s/p 3 days (10/29 - 10/31 ) Deniece Ree s/p 4 days (10/28 - 10/31).  Plan to transition to PO abx today Omnicef (11/1-11/6) and continue for a total of 10 days of abx.  - continue CPT/guaifenesin  - titrate O2 to room air as appropriate (88%-92%)  Sepsis, resolved:   Patient presented lactic acid >6, fevers to 102, tachycardia and tachypnea and found to have probable PNA on CXR.  Afebrile for >24 hrs and lactate acid improved to 0.6.  - KVO'd fluids  Tachycardia:  Baseline tachycardia to one-teens and overnight was between 95-111.  Afebrile for >24 hrs and stable.  - vital signs per floor protocol  Macrocytic Anemia - Hgb 8.2, recently 11.8 on 10/28 - consider B12  AKI, resolved:  Likely due to sepsis, improved with fluids.  - continue to monitor  J-tube:  Receiving tube feeds without complications.Did have one hypoglycemic episode to 67 and improved to 120s with 1/2 amp d50.  At  45cc/hr - continue feeds  Hypothyroid:  TSH WNL on 9/17.  - continue synthroid   Infantile cerebral palsy: No recent seizure activity. - continue keppra  FEN/GI: Tube feeds PPx: Lovenox  Disposition: Transitioning to PO Abx, consider DC today  Subjective:  Patient doing well this AM. Does not appear to be in any distress. Unable to converse due to intellectual disability at baseline.  Objective: Temp:  [97 F (36.1 C)-98.2 F (36.8 C)] 97 F (36.1 C) (11/01 0801) Pulse Rate:  [95-111] 95 (11/01 0801) Resp:  [18] 18 (11/01 0801) BP: (116-124)/(72-80) 116/79 (11/01 0801) SpO2:  [98 %-100 %] 98 % (11/01 0801) Weight:  [48.5 kg (107 lb)-52.5 kg (115 lb 12.8 oz)] 52.5 kg (115 lb 12.8 oz) (11/01 0500) Physical Exam: General: NAD, chronically ill appearing female with quadriplegia Cardiovascular: RRR, no m/r/g Respiratory: CTA bil, poor effort Abdomen: soft and nontender, normoactive BS Extremities: small, chronic changes 2/2 CP, no unilateral edema or tenderness  Laboratory:  Recent Labs Lab 02/24/16 0237 02/25/16 0140 02/26/16 0453  WBC 12.2* 10.4 7.6  HGB 8.3* 8.5* 8.2*  HCT 27.5* 27.7* 26.2*  PLT 146* PLATELET CLUMPS NOTED ON SMEAR, COUNT APPEARS DECREASED 149*    Recent Labs Lab 02/22/16 1915 02/23/16 0512 02/24/16 0237 02/26/16 0453  NA 136 140 138 142  K 4.6 4.4 4.4 3.6  CL 97* 112* 109 107  CO2 28 22 25 30   BUN 20 14 12 11   CREATININE 1.26* 1.00 0.98 0.85  CALCIUM 9.2 8.2* 8.9  8.6*  PROT 8.9*  --   --   --   BILITOT 0.4  --   --   --   ALKPHOS 105  --   --   --   ALT 19  --   --   --   AST 36  --   --   --   GLUCOSE 134* 105* 71 131*      Imaging/Diagnostic Tests: Dg Chest 2 View  Result Date: 02/25/2016 CLINICAL DATA:  Hypoxia EXAM: CHEST  2 VIEW COMPARISON:  02/24/2016 FINDINGS: A right upper extremity catheter tip overlies the SVC. Low lung volumes with elevated right diaphragm. Tiny left pleural effusion. Small moderate right pleural  effusion suspected. Hazy right lower lobe atelectasis or infiltrate. Patchy atelectasis or infiltrates in the left lung base. Cardiomediastinal silhouette is nonenlarged. No pneumothorax. Gastrostomy tube in left upper quadrant. IMPRESSION: 1. Elevated right diaphragm with low lung volumes. Bilateral pleural effusions, small on the left and small moderate on the right. 2. Increasing atelectasis, infiltrate or edema and the right lung base. 3. Patchy atelectasis or inflammatory infiltrates within the left lung base. Electronically Signed   By: Donavan Foil M.D.   On: 02/25/2016 16:36   Dg Chest Port 1 View  Result Date: 02/24/2016 CLINICAL DATA:  Hypoxia EXAM: PORTABLE CHEST 1 VIEW COMPARISON:  PA and lateral chest x-ray of February 22, 2016 FINDINGS: There is new increased density at the light right lung base. Subtle increased density projects over the mid and lower left lung as well. There is partial obscuration of the right hemidiaphragm. The left hemidiaphragm remains visible. The heart is normal in size. The pulmonary vascularity is mildly engorged and indistinct. The bony thorax exhibits no acute abnormality where visualized. IMPRESSION: Interval development of atelectasis or pneumonia at the right lung base. There is a probable small right pleural effusion. Mild interstitial prominence bilaterally may reflect interstitial edema or developing interstitial pneumonia. A PA and lateral chest x-ray with deep inspiratory effort would be useful when the patient can tolerate the procedure. Electronically Signed   By: David  [place] M.D.   On: 02/24/2016 10:43    Everrett Coombe, MD 02/26/2016, 9:43 AM PGY-1, [place] Intern pager: [phone], text pages welcome

## 2016-02-25 NOTE — Progress Notes (Signed)
Report received from Josh, RN.

## 2016-02-26 DIAGNOSIS — R05 Cough: Secondary | ICD-10-CM

## 2016-02-26 LAB — BASIC METABOLIC PANEL
ANION GAP: 5 (ref 5–15)
BUN: 11 mg/dL (ref 6–20)
CALCIUM: 8.6 mg/dL — AB (ref 8.9–10.3)
CO2: 30 mmol/L (ref 22–32)
Chloride: 107 mmol/L (ref 101–111)
Creatinine, Ser: 0.85 mg/dL (ref 0.44–1.00)
GFR calc Af Amer: >60 mL/min (ref >60)
GLUCOSE: 131 mg/dL — AB (ref 65–99)
Potassium: 3.6 mmol/L (ref 3.5–5.1)
Sodium: 142 mmol/L (ref 135–145)

## 2016-02-26 LAB — GLUCOSE, CAPILLARY
Glucose-Capillary: 107 mg/dL — ABNORMAL HIGH (ref 65–99)
Glucose-Capillary: 111 mg/dL — ABNORMAL HIGH (ref 65–99)
Glucose-Capillary: 122 mg/dL — ABNORMAL HIGH (ref 65–99)
Glucose-Capillary: 123 mg/dL — ABNORMAL HIGH (ref 65–99)
Glucose-Capillary: 183 mg/dL — ABNORMAL HIGH (ref 65–99)
Glucose-Capillary: 99 mg/dL (ref 65–99)

## 2016-02-26 LAB — CBC
HCT: 26.2 % — ABNORMAL LOW (ref 36.0–46.0)
HEMOGLOBIN: 8.2 g/dL — AB (ref 12.0–15.0)
MCH: 33.2 pg (ref 26.0–34.0)
MCHC: 31.3 g/dL (ref 30.0–36.0)
MCV: 106.1 fL — ABNORMAL HIGH (ref 78.0–100.0)
PLATELETS: 149 10*3/uL — AB (ref 150–400)
RBC: 2.47 MIL/uL — ABNORMAL LOW (ref 3.87–5.11)
RDW: 17.8 % — AB (ref 11.5–15.5)
WBC: 7.6 10*3/uL (ref 4.0–10.5)

## 2016-02-26 LAB — VITAMIN B12: Vitamin B-12: 2052 pg/mL — ABNORMAL HIGH (ref 180–914)

## 2016-02-26 MED ORDER — CEFDINIR 125 MG/5ML PO SUSR
300.0000 mg | Freq: Two times a day (BID) | ORAL | Status: DC
  Administered 2016-02-26 – 2016-02-27 (×2): 300 mg via ORAL
  Filled 2016-02-26 (×3): qty 15

## 2016-02-27 ENCOUNTER — Other Ambulatory Visit: Payer: Self-pay | Admitting: Family

## 2016-02-27 LAB — GLUCOSE, CAPILLARY
GLUCOSE-CAPILLARY: 114 mg/dL — AB (ref 65–99)
GLUCOSE-CAPILLARY: 111 mg/dL — AB (ref 65–99)
GLUCOSE-CAPILLARY: 83 mg/dL (ref 65–99)
Glucose-Capillary: 122 mg/dL — ABNORMAL HIGH (ref 65–99)
Glucose-Capillary: 132 mg/dL — ABNORMAL HIGH (ref 65–99)

## 2016-02-27 LAB — CULTURE, BLOOD (ROUTINE X 2)
CULTURE: NO GROWTH
CULTURE: NO GROWTH

## 2016-02-27 LAB — BASIC METABOLIC PANEL
ANION GAP: 4 — AB (ref 5–15)
BUN: 10 mg/dL (ref 6–20)
CHLORIDE: 106 mmol/L (ref 101–111)
CO2: 31 mmol/L (ref 22–32)
Calcium: 8.8 mg/dL — ABNORMAL LOW (ref 8.9–10.3)
Creatinine, Ser: 0.76 mg/dL (ref 0.44–1.00)
GFR calc non Af Amer: >60 mL/min (ref >60)
Glucose, Bld: 111 mg/dL — ABNORMAL HIGH (ref 65–99)
Potassium: 3.8 mmol/L (ref 3.5–5.1)
Sodium: 141 mmol/L (ref 135–145)

## 2016-02-27 LAB — CBC
HEMATOCRIT: 27.2 % — AB (ref 36.0–46.0)
HEMOGLOBIN: 8.6 g/dL — AB (ref 12.0–15.0)
MCH: 33.5 pg (ref 26.0–34.0)
MCHC: 31.6 g/dL (ref 30.0–36.0)
MCV: 105.8 fL — ABNORMAL HIGH (ref 78.0–100.0)
Platelets: 180 10*3/uL (ref 150–400)
RBC: 2.57 MIL/uL — ABNORMAL LOW (ref 3.87–5.11)
RDW: 17.7 % — ABNORMAL HIGH (ref 11.5–15.5)
WBC: 8.5 10*3/uL (ref 4.0–10.5)

## 2016-02-27 LAB — FOLATE RBC
Folate, Hemolysate: 361.4 ng/mL
Folate, RBC: 1475 ng/mL (ref >498)
HEMATOCRIT: 24.5 % — AB (ref 34.0–46.6)

## 2016-02-27 MED ORDER — CEFDINIR 125 MG/5ML PO SUSR: 300.0000 mg | Freq: Two times a day (BID) | ORAL | 0 refills | Status: DC

## 2016-02-27 NOTE — Progress Notes (Signed)
Grandmother given discharge instructions and verbalized understanding, prescriptions sent to pharmacy, PICC line was removed by IV team, and patient was in no distress.   Betha Loa Skip Litke, RN

## 2016-02-27 NOTE — Progress Notes (Signed)
Family Medicine Teaching Service Daily Progress Note Intern Pager: [phone]  Patient name: Megan [name] Medical record number: [id number] Date of birth: Mar 04, 1981 Age: 35 y.o. Gender: female  Primary Care Provider: Georges Lynch, MD Consultants: CCM Code Status: DNR  Pt Overview and Major Events to Date:  1. Admitted to CCM 2. Transferred to FMTS on 10/30 3. Transfer to med-surg on 10/31 4. Transitioned to PO antibiotics and weaned to room air 11/1  Assessment and Plan: 35 year old female PMH CP, quadriplegia, CHF, COPD, MR who presented with sepsis secondary to pneumonia and a new oxygen requirement.  #Likely HCAP, Stable:  Afebrile >24h and satting well on room air. Patient now on day #6 antibiotics. Aside from some baseline tachycardia, VSS. PICC in place.Diffuse ronchi and increased upper airway sounds. Rec'd 1d zosyn (10/28), 3d cefepime (10/29-10/31), 4d vanc (10/28-10/31).  - blood cultures NG x 3d, urine culture NG (final), sputum culture not collected -  continue PO abx today Omnicef (11/1-11/6) and continue for a total of 10 days of abx.  - continue CPT/guaifenesin  - satting 100% on RA  #Anemia and tachycardia:  Baseline tachycardia to one-teens and overnight was between 95-106. Hgb 8.6, recently 11.8 on 10/28 - vital signs per floor protocol - B12 elevated at 2052 - folate pending  #AKI, resolved: Likely due to sepsis, improved with fluids.  - continue to monitor  #J-tube: Receiving tube feeds without complications.Did have one hypoglycemic episode to 67 and improved to 120s with 1/2 amp d50. At 45cc/hr - continue feeds  #Hypothyroid: TSH WNL on 9/17.  - continue synthroid   #Infantile cerebral palsy: No recent seizure activity. - continue keppra  FEN/GI: Tube feeds PPx: Lovenox  Disposition: Likely DC today  Subjective:  Patient rests comfortably in bed in no apparent distress. Saturations 100% on RA  Objective: Temp:  [98.9 F (37.2 C)-99.2 F  (37.3 C)] 99.2 F (37.3 C) (11/01 2112) Pulse Rate:  [102-106] 106 (11/01 2114) Resp:  [17] 17 (11/01 2112) BP: (116-143)/(63-97) 143/97 (11/01 2112) SpO2:  [98 %-100 %] 98 % (11/01 2114) Weight:  [50.8 kg (112 lb)] 50.8 kg (112 lb) (11/02 0533) Physical Exam: General: chronically ill patient with CP rests in bed, NAD Cardiovascular: RRR no m/r/g Respiratory: CTA bil, no W/R/R, poor inspiratory effort Abdomen: soft and nontender, nondistended Extremities: poorly developed, SCDs in place, no LE edema  Laboratory:  Recent Labs Lab 02/25/16 0140 02/26/16 0453 02/27/16 0530  WBC 10.4 7.6 8.5  HGB 8.5* 8.2* 8.6*  HCT 27.7* 26.2* 27.2*  PLT PLATELET CLUMPS NOTED ON SMEAR, COUNT APPEARS DECREASED 149* 180    Recent Labs Lab 02/22/16 1915  02/24/16 0237 02/26/16 0453 02/27/16 0530  NA 136  < > 138 142 141  K 4.6  < > 4.4 3.6 3.8  CL 97*  < > 109 107 106  CO2 28  < > 25 30 31   BUN 20  < > 12 11 10   CREATININE 1.26*  < > 0.98 0.85 0.76  CALCIUM 9.2  < > 8.9 8.6* 8.8*  PROT 8.9*  --   --   --   --   BILITOT 0.4  --   --   --   --   ALKPHOS 105  --   --   --   --   ALT 19  --   --   --   --   AST 36  --   --   --   --  GLUCOSE 134*  < > 71 131* 111*  < > = values in this interval not displayed.   Imaging/Diagnostic Tests: Dg Chest 2 View  Result Date: 02/25/2016 CLINICAL DATA:  Hypoxia EXAM: CHEST  2 VIEW COMPARISON:  02/24/2016 FINDINGS: A right upper extremity catheter tip overlies the SVC. Low lung volumes with elevated right diaphragm. Tiny left pleural effusion. Small moderate right pleural effusion suspected. Hazy right lower lobe atelectasis or infiltrate. Patchy atelectasis or infiltrates in the left lung base. Cardiomediastinal silhouette is nonenlarged. No pneumothorax. Gastrostomy tube in left upper quadrant. IMPRESSION: 1. Elevated right diaphragm with low lung volumes. Bilateral pleural effusions, small on the left and small moderate on the right. 2. Increasing  atelectasis, infiltrate or edema and the right lung base. 3. Patchy atelectasis or inflammatory infiltrates within the left lung base. Electronically Signed   By: Donavan Foil M.D.   On: 02/25/2016 16:36     Everrett Coombe, MD 02/27/2016, 12:16 PM PGY-1, [place] Intern pager: [phone], text pages welcome

## 2016-02-27 NOTE — Discharge Instructions (Signed)
Ms. Megan [name] was admitted to the hospital with an infection. She was treated for pneumonia and required oxygen to help her breath.  She is doing much better now. We will discharge her with antibiotics which she will have to complete. - please bring her to her hospital follow up appointment - please have her complete her course of antibiotics.    Community-Acquired Pneumonia, Adult Pneumonia is an infection of the lungs. There are different types of pneumonia. One type can develop while a person is in a hospital. A different type, called community-acquired pneumonia, develops in people who are not, or have not recently been, in the hospital or other health care facility.  CAUSES Pneumonia may be caused by bacteria, viruses, or funguses. Community-acquired pneumonia is often caused by Streptococcus pneumonia bacteria. These bacteria are often passed from one person to another by breathing in droplets from the cough or sneeze of an infected person. RISK FACTORS The condition is more likely to develop in:  People who havechronic diseases, such as chronic obstructive pulmonary disease (COPD), asthma, congestive heart failure, cystic fibrosis, diabetes, or kidney disease.  People who haveearly-stage or late-stage HIV.  People who havesickle cell disease.  People who havehad their spleen removed (splenectomy).  People who havepoor Human resources officer.  People who havemedical conditions that increase the risk of breathing in (aspirating) secretions their own mouth and nose.   People who havea weakened immune system (immunocompromised).  People who smoke.  People whotravel to areas where pneumonia-causing germs commonly exist.  People whoare around animal habitats or animals that have pneumonia-causing germs, including birds, bats, rabbits, cats, and farm animals. SYMPTOMS Symptoms of this condition include:  Adry cough.  A wet (productive) cough.  Fever.  Sweating.  Chest pain,  especially when breathing deeply or coughing.  Rapid breathing or difficulty breathing.  Shortness of breath.  Shaking chills.  Fatigue.  Muscle aches. DIAGNOSIS Your health care provider will take a medical history and perform a physical exam. You may also have other tests, including:  Imaging studies of your chest, including X-rays.  Tests to check your blood oxygen level and other blood gases.  Other tests on blood, mucus (sputum), fluid around your lungs (pleural fluid), and urine. If your pneumonia is severe, other tests may be done to identify the specific cause of your illness. TREATMENT The type of treatment that you receive depends on many factors, such as the cause of your pneumonia, the medicines you take, and other medical conditions that you have. For most adults, treatment and recovery from pneumonia may occur at home. In some cases, treatment must happen in a hospital. Treatment may include:  Antibiotic medicines, if the pneumonia was caused by bacteria.  Antiviral medicines, if the pneumonia was caused by a virus.  Medicines that are given by mouth or through an IV tube.  Oxygen.  Respiratory therapy. Although rare, treating severe pneumonia may include:  Mechanical ventilation. This is done if you are not breathing well on your own and you cannot maintain a safe blood oxygen level.  Thoracentesis. This procedureremoves fluid around one lung or both lungs to help you breathe better. HOME CARE INSTRUCTIONS  Take over-the-counter and prescription medicines only as told by your health care provider.  Only takecough medicine if you are losing sleep. Understand that cough medicine can prevent your body's natural ability to remove mucus from your lungs.  If you were prescribed an antibiotic medicine, take it as told by your health care provider. Do not  stop taking the antibiotic even if you start to feel better.  Sleep in a semi-upright position at night. Try  sleeping in a reclining chair, or place a few pillows under your head.  Do not use tobacco products, including cigarettes, chewing tobacco, and e-cigarettes. If you need help quitting, ask your health care provider.  Drink enough water to keep your urine clear or pale yellow. This will help to thin out mucus secretions in your lungs. PREVENTION There are ways that you can decrease your risk of developing community-acquired pneumonia. Consider getting a pneumococcal vaccine if:  You are older than 36 years of age.  You are older than 36 years of age and are undergoing cancer treatment, have chronic lung disease, or have other medical conditions that affect your immune system. Ask your health care provider if this applies to you. There are different types and schedules of pneumococcal vaccines. Ask your health care provider which vaccination option is best for you. You may also prevent community-acquired pneumonia if you take these actions:  Get an influenza vaccine every year. Ask your health care provider which type of influenza vaccine is best for you.  Go to the dentist on a regular basis.  Wash your hands often. Use hand sanitizer if soap and water are not available. SEEK MEDICAL CARE IF:  You have a fever.  You are losing sleep because you cannot control your cough with cough medicine. SEEK IMMEDIATE MEDICAL CARE IF:  You have worsening shortness of breath.  You have increased chest pain.  Your sickness becomes worse, especially if you are an older adult or have a weakened immune system.  You cough up blood.   This information is not intended to replace advice given to you by your health care provider. Make sure you discuss any questions you have with your health care provider.   Document Released: 04/13/2005 Document Revised: 01/02/2015 Document Reviewed: 08/08/2014 Elsevier Interactive Patient Education [employer].

## 2016-03-02 ENCOUNTER — Ambulatory Visit (INDEPENDENT_AMBULATORY_CARE_PROVIDER_SITE_OTHER): Payer: Medicaid Other | Admitting: Obstetrics and Gynecology

## 2016-03-02 ENCOUNTER — Encounter: Payer: Self-pay | Admitting: Obstetrics and Gynecology

## 2016-03-02 VITALS — BP 103/60 | HR 109 | Temp 98.2°F | Wt 108.0 lb

## 2016-03-02 DIAGNOSIS — J189 Pneumonia, unspecified organism: Secondary | ICD-10-CM | POA: Diagnosis not present

## 2016-03-02 DIAGNOSIS — K117 Disturbances of salivary secretion: Secondary | ICD-10-CM

## 2016-03-02 DIAGNOSIS — Z09 Encounter for follow-up examination after completed treatment for conditions other than malignant neoplasm: Secondary | ICD-10-CM | POA: Diagnosis not present

## 2016-03-02 MED ORDER — CEFDINIR 125 MG/5ML PO SUSR: 300.0000 mg | Freq: Two times a day (BID) | ORAL | 0 refills | Status: AC

## 2016-03-02 NOTE — Progress Notes (Signed)
     Subjective: Chief Complaint  Patient presents with  . Follow-up     Patient is nonverbal so history provided by cousin.  HPI: Megan [name] is a 35 y.o. presenting to clinic today to discuss the following:  #Hospital follow-up Hospitalized for HCAP recently. Hospital dates 10/28-11/2. Discharged home with [employer] to complete antibiotic course but did not have enough medication to complete regimen. Rasn out of antibiotics on 11/4 but was supposed to complete today. Caretaker requesting to have a couple more days of antibiotics. Denies any more fevers, respiratory distress. Does need a new DME order for suction machine to help with secretions since not working anymore.   Health Maintenance: Up to Date  PMH: Cerebral palsy  ROS noted in HPI.  Past Medical, Surgical, Social, and Family History Reviewed & Updated per EMR. Smoking status - Never smoker   Objective: BP 103/60   Pulse (!) 109   Temp 98.2 F (36.8 C) (Oral)   Wt 108 lb (49 kg)   SpO2 91%   BMI 21.09 kg/m  Vitals and nursing notes reviewed  Physical Exam General: well-appearing patient with CP, NAD HEENT: excessive salivation Cardiovascular: RRR no m/r/g Respiratory: CTA bil, no W/R/R, poor inspiratory effort Abdomen: soft and nontender, nondistended Extremities: poor tone, no LE edema  Assessment/Plan: Please see problem based Assessment and Plan   Orders Placed This Encounter  Procedures  . For home use only DME Suction    Order Specific Question:   Suction    Answer:   Oral    Meds ordered this encounter  Medications  . cefdinir (OMNICEF) 125 MG/5ML suspension    Sig: Take 12 mLs (300 mg total) by mouth 2 (two) times daily.    Dispense:  100 mL    Refill:  [place], DO 03/02/2016, 2:47 PM PGY-3, [place]

## 2016-03-02 NOTE — Patient Instructions (Signed)
Looks well today New DME order placed and sent in to [facility] Extended antibiotics out Letter for school written  Follow-up with PCP in 1 month

## 2016-03-03 NOTE — Assessment & Plan Note (Addendum)
Recently discharged for HCAP. Discharge summary reviewed. Overall improved. Did not complete full course of Abx since Rx ran out early(not enough prescribed). Gave an additional 4 days to complete full course. Patient is high risk for recurrent pneumonias. She is well appearing today and afebrile. Vital signs stable.

## 2016-03-03 NOTE — Assessment & Plan Note (Signed)
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 Patient's current suction machine broken. Rx for new DME oral suction machine given.

## 2016-03-20 ENCOUNTER — Other Ambulatory Visit: Payer: Self-pay | Admitting: Pediatrics

## 2016-03-20 DIAGNOSIS — G40309 Generalized idiopathic epilepsy and epileptic syndromes, not intractable, without status epilepticus: Secondary | ICD-10-CM

## 2016-03-20 DIAGNOSIS — G40209 Localization-related (focal) (partial) symptomatic epilepsy and epileptic syndromes with complex partial seizures, not intractable, without status epilepticus: Secondary | ICD-10-CM

## 2016-04-01 ENCOUNTER — Telehealth: Payer: Self-pay | Admitting: Family Medicine

## 2016-04-01 NOTE — Telephone Encounter (Signed)
Pt need some diapers's called in to the [facility] jw

## 2016-04-02 ENCOUNTER — Ambulatory Visit: Payer: Medicaid Other | Admitting: Family Medicine

## 2016-04-03 ENCOUNTER — Other Ambulatory Visit: Payer: Self-pay | Admitting: Family Medicine

## 2016-04-03 DIAGNOSIS — N39498 Other specified urinary incontinence: Secondary | ICD-10-CM

## 2016-04-03 DIAGNOSIS — G809 Cerebral palsy, unspecified: Secondary | ICD-10-CM

## 2016-04-03 DIAGNOSIS — R159 Full incontinence of feces: Secondary | ICD-10-CM

## 2016-04-03 MED ORDER — INCONTINENCE SUPPLIES MISC: 1.0000 [IU] | 99 refills | Status: AC | PRN

## 2016-04-03 NOTE — Telephone Encounter (Signed)
Rx given to nursing to fax

## 2016-04-03 NOTE — Telephone Encounter (Signed)
Faxed to [facility]. Deseree Kennon Holter, CMA

## 2016-05-21 ENCOUNTER — Other Ambulatory Visit: Payer: Self-pay | Admitting: Family

## 2016-05-21 DIAGNOSIS — G40209 Localization-related (focal) (partial) symptomatic epilepsy and epileptic syndromes with complex partial seizures, not intractable, without status epilepticus: Secondary | ICD-10-CM

## 2016-05-21 DIAGNOSIS — G40309 Generalized idiopathic epilepsy and epileptic syndromes, not intractable, without status epilepticus: Secondary | ICD-10-CM

## 2016-05-24 ENCOUNTER — Other Ambulatory Visit: Payer: Self-pay | Admitting: Family

## 2016-05-24 DIAGNOSIS — G40309 Generalized idiopathic epilepsy and epileptic syndromes, not intractable, without status epilepticus: Secondary | ICD-10-CM

## 2016-05-24 DIAGNOSIS — G40209 Localization-related (focal) (partial) symptomatic epilepsy and epileptic syndromes with complex partial seizures, not intractable, without status epilepticus: Secondary | ICD-10-CM

## 2016-06-03 ENCOUNTER — Encounter: Payer: Self-pay | Admitting: Family Medicine

## 2016-06-03 ENCOUNTER — Ambulatory Visit (INDEPENDENT_AMBULATORY_CARE_PROVIDER_SITE_OTHER): Payer: Medicaid Other | Admitting: Family Medicine

## 2016-06-03 ENCOUNTER — Ambulatory Visit: Payer: Medicaid Other | Admitting: Family Medicine

## 2016-06-03 VITALS — BP 92/68 | HR 85 | Temp 97.1°F | Ht 59.0 in | Wt 110.0 lb

## 2016-06-03 DIAGNOSIS — G809 Cerebral palsy, unspecified: Secondary | ICD-10-CM

## 2016-06-03 DIAGNOSIS — G8250 Quadriplegia, unspecified: Secondary | ICD-10-CM | POA: Diagnosis not present

## 2016-06-03 DIAGNOSIS — R059 Cough, unspecified: Secondary | ICD-10-CM

## 2016-06-03 DIAGNOSIS — R05 Cough: Secondary | ICD-10-CM | POA: Diagnosis not present

## 2016-06-03 NOTE — Assessment & Plan Note (Addendum)
  Wheelchair is too small for patient  -rx given for new wheelchair -return or call with any issues or needs

## 2016-06-03 NOTE — Progress Notes (Signed)
    Subjective:    Patient ID: Megan [name], female    DOB: [birth year], 36 y.o.   MRN: [id number]   CC: needs new wheelchair  HPI: here with her sister who is a big part of her care at home. Per sister, her wheelchair is too small for her and she is getting bruises from it. Has case manager who suggested getting a larger chair for health and safety. Needs prescription.  Has been coughing past several weeks. Not having a hard time breathing. Cough is not productive. Patient eating and drinking normally. Normal stool and urine output. Acting normally. No fevers.   Smoking status reviewed- never smoker  Review of Systems- see HPI   Objective:  BP 92/68   Pulse 85   Temp 97.1 F (36.2 C) (Oral)   Ht 4\' 11"  (1.499 m) Comment: per cousin  Wt 110 lb (49.9 kg) Comment: unable to weigh in wheelchair, cousin states approx. weight  SpO2 96%   BMI 22.22 kg/m  Vitals and nursing note reviewed  General: chronically ill appearing in NAD Neck: supple, non-tender, without lymphadenopathy Cardiac: RRR, clear S1 and S2, no murmurs, rubs, or gallops Respiratory: clear to auscultation bilaterally, no increased work of breathing Abdomen: soft, nontender, nondistended Extremities: no edema or cyanosis. Thin with muscle atrophy Neuro: non-verbal. Wheelchair bound. Quadriplegic.    Assessment & Plan:    Spastic quadriparesis ([place])  Wheelchair is too small for patient  -rx given for new wheelchair -return or call with any issues or needs  Cough  Per sister, has been coughing for past several weeks. Is improving. Non-productive. No breathing issues.   -offered referral to SLP for swallow eval, sister declined as they have had this in the past, patient is NPO -gave return precautions for fevers, chills, worsening cough   Return if symptoms worsen or fail to improve.   Lucila Maine, DO Family Medicine Resident PGY-1

## 2016-06-03 NOTE — Assessment & Plan Note (Addendum)
  Per sister, has been coughing for past several weeks. Is improving. Non-productive. No breathing issues.   -offered referral to SLP for swallow eval, sister declined as they have had this in the past, patient is NPO -gave return precautions for fevers, chills, worsening cough

## 2016-06-30 ENCOUNTER — Other Ambulatory Visit (HOSPITAL_COMMUNITY): Payer: Self-pay | Admitting: Interventional Radiology

## 2016-06-30 ENCOUNTER — Ambulatory Visit (HOSPITAL_COMMUNITY)
Admission: RE | Admit: 2016-06-30 | Discharge: 2016-06-30 | Disposition: A | Discharge status: Home / Self Care | Payer: Medicaid Other | Source: Ambulatory Visit | Attending: Interventional Radiology | Admitting: Interventional Radiology

## 2016-06-30 ENCOUNTER — Encounter (HOSPITAL_COMMUNITY): Payer: Self-pay | Admitting: Interventional Radiology

## 2016-06-30 DIAGNOSIS — R633 Feeding difficulties, unspecified: Secondary | ICD-10-CM

## 2016-06-30 DIAGNOSIS — Z431 Encounter for attention to gastrostomy: Secondary | ICD-10-CM | POA: Insufficient documentation

## 2016-06-30 HISTORY — PX: IR GENERIC HISTORICAL: IMG1180011

## 2016-06-30 MED ORDER — IOPAMIDOL (ISOVUE-300) INJECTION 61%
50.0000 mL | Freq: Once | INTRAVENOUS | Status: AC | PRN
  Administered 2016-06-30: 10 mL via INTRAVENOUS

## 2016-06-30 MED ORDER — IOPAMIDOL (ISOVUE-300) INJECTION 61%
INTRAVENOUS | Status: AC
  Filled 2016-06-30: qty 50

## 2016-06-30 NOTE — Procedures (Signed)
24 Fr G tube exchange No comp/EBL

## 2016-07-01 ENCOUNTER — Other Ambulatory Visit: Payer: Self-pay | Admitting: *Deleted

## 2016-07-01 MED ORDER — LEVOTHYROXINE SODIUM 88 MCG PO TABS: ORAL_TABLET | ORAL | 11 refills | Status: DC

## 2016-07-02 ENCOUNTER — Encounter (INDEPENDENT_AMBULATORY_CARE_PROVIDER_SITE_OTHER): Payer: Self-pay | Admitting: *Deleted

## 2016-07-02 ENCOUNTER — Other Ambulatory Visit: Payer: Self-pay | Admitting: Family

## 2016-07-08 ENCOUNTER — Other Ambulatory Visit: Payer: Self-pay | Admitting: Family Medicine

## 2016-07-08 ENCOUNTER — Ambulatory Visit (INDEPENDENT_AMBULATORY_CARE_PROVIDER_SITE_OTHER): Payer: Medicaid Other | Admitting: Family Medicine

## 2016-07-08 ENCOUNTER — Encounter: Payer: Self-pay | Admitting: Family Medicine

## 2016-07-08 ENCOUNTER — Ambulatory Visit
Admission: RE | Admit: 2016-07-08 | Discharge: 2016-07-08 | Disposition: A | Discharge status: Home / Self Care | Payer: Medicaid Other | Source: Ambulatory Visit | Attending: Family Medicine | Admitting: Family Medicine

## 2016-07-08 VITALS — BP 112/77 | HR 95 | Temp 97.7°F

## 2016-07-08 DIAGNOSIS — R059 Cough, unspecified: Secondary | ICD-10-CM

## 2016-07-08 DIAGNOSIS — R05 Cough: Secondary | ICD-10-CM

## 2016-07-08 DIAGNOSIS — Z0000 Encounter for general adult medical examination without abnormal findings: Secondary | ICD-10-CM

## 2016-07-08 DIAGNOSIS — Z79899 Other long term (current) drug therapy: Secondary | ICD-10-CM

## 2016-07-08 LAB — COMPLETE METABOLIC PANEL WITH GFR
ALBUMIN: 3.2 g/dL — AB (ref 3.6–5.1)
ALK PHOS: 108 U/L (ref 33–115)
ALT: 18 U/L (ref 6–29)
AST: 38 U/L — AB (ref 10–30)
BILIRUBIN TOTAL: 0.4 mg/dL (ref 0.2–1.2)
BUN: 18 mg/dL (ref 7–25)
CALCIUM: 9 mg/dL (ref 8.6–10.2)
CO2: 31 mmol/L (ref 20–31)
CREATININE: 1.08 mg/dL (ref 0.50–1.10)
Chloride: 102 mmol/L (ref 98–110)
GFR, Est African American: 76 mL/min (ref >=60)
GFR, Est Non African American: 66 mL/min (ref >=60)
Glucose, Bld: 76 mg/dL (ref 65–99)
POTASSIUM: 4.6 mmol/L (ref 3.5–5.3)
Sodium: 140 mmol/L (ref 135–146)
TOTAL PROTEIN: 8.4 g/dL — AB (ref 6.1–8.1)

## 2016-07-08 LAB — CBC
HCT: 35.9 % (ref 35.0–45.0)
Hemoglobin: 11.9 g/dL (ref 11.7–15.5)
MCH: 34.4 pg — ABNORMAL HIGH (ref 27.0–33.0)
MCHC: 33.1 g/dL (ref 32.0–36.0)
MCV: 103.8 fL — ABNORMAL HIGH (ref 80.0–100.0)
MPV: 10.5 fL (ref 7.5–12.5)
PLATELETS: 200 10*3/uL (ref 140–400)
RBC: 3.46 MIL/uL — ABNORMAL LOW (ref 3.80–5.10)
RDW: 18.7 % — AB (ref 11.0–15.0)
WBC: 8.2 10*3/uL (ref 3.8–10.8)

## 2016-07-08 LAB — LIPID PANEL
CHOL/HDL RATIO: 6.4 ratio — AB (ref <5.0)
CHOLESTEROL: 217 mg/dL — AB (ref <200)
HDL: 34 mg/dL — ABNORMAL LOW (ref >50)
LDL Cholesterol: 131 mg/dL — ABNORMAL HIGH (ref <100)
Triglycerides: 262 mg/dL — ABNORMAL HIGH (ref <150)
VLDL: 52 mg/dL — ABNORMAL HIGH (ref <30)

## 2016-07-08 NOTE — Assessment & Plan Note (Signed)
Patient's family is reporting a recent development of cough. Some sputum production. The patient has been afebrile and has not had any issues with breathing. Patient is at increased risk for aspiration however she now has a G-tube in place making this risk less worrisome. Patient is at increased risk for lung infections. - Chest x-ray ordered - We'll contact family once these results have been obtained.  Update: Chest x-ray showed no evidence of consolidation. Bilateral atelectasis was noted. I informed patient's grandmother who was waiting for my call. I let her know that at this time antibiotics were deemed unnecessary. Encouraged regular use of incentive spirometer (which they have at home). Grandmother stated her understanding and thanked me for these recommendations. - Discussed strict return precautions over the phone with grandmother.

## 2016-07-08 NOTE — Patient Instructions (Signed)
It was a pleasure seeing you today in our clinic. Today we discussed her cough. Here is the treatment plan we have discussed and agreed upon together:   - I placed an order for a chest x-ray to be performed. Please have this done later today or tomorrow. Once I get the report back I will contact you to discuss any additional plans that I have for her. - We are obtaining some labs today. I will mail you these results in 1-2 weeks.

## 2016-07-08 NOTE — Progress Notes (Signed)
Georges Lynch, MD, MS Phone: [phone]  Subjective:  CC -- Annual Physical;  Pt reports she has been dealing with a cough for about 1-2 weeks. Some sputum production. But not too significant. Not having any issues with breathing. No SOB. No wheezing. No problems sleeping.   Cardiovascular: - Dx Hypertension: no  - Dx Hyperlipidemia: no - Dx Obesity: no  - Physical Activity: no, complications of cerebral palsy  - Diabetes: no   Cancer: Colorectal >> Colonoscopy: no  Lung >> Tobacco Use: no Breast >> Mammogram: no  Cervical/Endometrial >>  - Postmenopausal: no  - Vaginal Bleeding: yes - Pap Smear: no   - Previous Abnormal Pap: no Skin >> Suspicious lesions: no   Social: Alcohol Use: no  Tobacco Use: no  Other Drugs: no  Risky Sexual Behavior: no  Depression: no  Support and Life at Home: yes   Other: Osteoporosis: no Zoster Vaccine: no Flu Vaccine: yes  Pneumonia Vaccine: no >> discussed need for this at next visit  ROS- denies recent fevers, chills, headache, irritability, diarrhea, vomiting, swelling.  Past Medical History Patient Active Problem List   Diagnosis Date Noted  . Healthcare maintenance 07/08/2016  . Hypoxia   . Pneumonia 02/23/2016  . Spastic quadriparesis ([place]) 02/12/2014  . Dysphagia 02/12/2014  . 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 Cough 01/26/2014  . Localization-related symptomatic epilepsy and epileptic syndromes with complex partial seizures, not intractable, without status epilepticus ([place]) 08/23/2013  . Unspecified constipation 04/18/2013  . Fibroid 04/18/2013  . Abnormal mental state 03/27/2013  . Seizure disorder ([place]) 03/27/2013  . Allergic rhinitis 11/04/2012  . 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 Encounter for long-term (current) use of other medications 10/19/2012  . Severe intellectual disabilities 10/19/2012  . 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 Feeding difficulties and  mismanagement 02/19/2012  . Hypothyroidism 06/25/2010  . Hypertension 06/23/2010  . Infantile cerebral palsy ([place]) 02/21/2010  . Blindness of both eyes 02/21/2010    Medications- reviewed and updated Current Outpatient Prescriptions  Medication Sig Dispense Refill  . Dextromethorphan Polistirex (DELSYM PO) Place 12.5 mLs into feeding tube every 12 (twelve) hours as needed (cough).    . diazepam (DIASTAT ACUDIAL) 10 MG GEL Place 5 mg rectally once. 2 Package 1  . ibuprofen (ADVIL,MOTRIN) 100 MG/5ML suspension Place 240 mg into feeding tube every 8 (eight) hours as needed for fever. 12 ml     . Incontinence Supplies MISC 1 Units by Does not apply route as needed (For voids or bowel movements). 100 each prn  . levETIRAcetam (KEPPRA) 100 MG/ML solution GIVE (12.5 MLS) IN THE MORNING AND (12.5 MLS) IN THE EVENING BY PEG TUBE 850 mL 1  . levothyroxine (SYNTHROID, LEVOTHROID) 88 MCG tablet TAKE 1 TABLET (88 MCG TOTAL) PER TUBE DAILY. 30 tablet 11  . loratadine (CLARITIN) 5 MG/5ML syrup Place 10 mLs (10 mg total) into feeding tube daily. (Patient taking differently: Place 10 mg into feeding tube daily as needed (congestion). ) 300 mL 12  . Nutritional Supplements (FEEDING SUPPLEMENT, JEVITY 1.2 CAL,) LIQD Place 574 mLs into feeding tube See admin instructions. Give 2 cans (574 mls) per tube at 40 ml/hr daily    . Nutritional Supplements (JEVITY) LIQD 1 Can by Per J Tube route 3 (three) times daily. (Patient not taking: Reported on 02/23/2016) 90 Can 11  . ranitidine (ZANTAC) 75 MG/5ML syrup TAKE 5 MLS (75 MG TOTAL) BY PER J TUBE ROUTE 2 (TWO) TIMES DAILY. 480 mL 10  .  Valproate Sodium (DEPAKENE) 250 MG/5ML SOLN solution GIVE 13 ML BY PEG TUBE EVERY 6 HOURS 1600 mL 2  . VIMPAT 10 MG/ML oral solution GIVE 5 MLS BY MOUTH EVERY MORNING AND 5 MLS BY MOUTH IN THE EVENING 310 mL 5   No current facility-administered medications for this visit.     Objective: BP 112/77   Pulse 95   Temp 97.7 F (36.5 C)  (Oral)   SpO2 90%  Gen: NAD, alert, cooperative with exam, severe mental deficits and cerebral palsy HEENT: NCAT, PERRL, MMM, TMs clear bilat CV: RRR, good S1/S2, no murmur Resp: Decreased breath sounds bilaterally, but overall CTABL, no wheezes, non-labored Abd: Soft, Non Tender, Non Distended, g-tube in place Ext: No edema, warm  Assessment/Plan:  Healthcare maintenance Patient is here for her annual physical. No significant changes in her status. Overall doing well. New cough recently that has family concerned. Patient has been taking medications as prescribed. - Labs obtained today  Cough Patient's family is reporting a recent development of cough. Some sputum production. The patient has been afebrile and has not had any issues with breathing. Patient is at increased risk for aspiration however she now has a G-tube in place making this risk less worrisome. Patient is at increased risk for lung infections. - Chest x-ray ordered - We'll contact family once these results have been obtained.  Update: Chest x-ray showed no evidence of consolidation. Bilateral atelectasis was noted. I informed patient's grandmother who was waiting for my call. I let her know that at this time antibiotics were deemed unnecessary. Encouraged regular use of incentive spirometer (which they have at home). Grandmother stated her understanding and thanked me for these recommendations. - Discussed strict return precautions over the phone with grandmother.   Orders Placed This Encounter  Procedures  . Lipid panel  . COMPLETE METABOLIC PANEL WITH GFR  . CBC  . Valproic acid level     Elberta Leatherwood, MD,MS,  PGY3 07/08/2016 8:01 PM

## 2016-07-08 NOTE — Assessment & Plan Note (Signed)
Patient is here for her annual physical. No significant changes in her status. Overall doing well. New cough recently that has family concerned. Patient has been taking medications as prescribed. - Labs obtained today

## 2016-07-09 LAB — VALPROIC ACID LEVEL: VALPROIC ACID LVL: 156.6 ug/mL — AB (ref 50.0–100.0)

## 2016-07-13 ENCOUNTER — Encounter: Payer: Self-pay | Admitting: Family Medicine

## 2016-07-20 ENCOUNTER — Telehealth: Payer: Self-pay | Admitting: Family Medicine

## 2016-07-20 NOTE — Telephone Encounter (Signed)
[facility]:  [place] Medicaid form was faxed here 07-02-16.  The date of service on the form is 06-02-16.  Form has not been received back by [facility].  Please advise

## 2016-07-20 NOTE — Telephone Encounter (Signed)
I do remember filling out some recent paperwork for this patient but would not be able to say which forms they were, my apologies. I would be happy to fill out whatever they need if they fax [place] another copy.

## 2016-07-28 NOTE — Telephone Encounter (Signed)
Left message on voicemail for Megan [name] of message from MD, asked that she refax forms.

## 2016-08-03 ENCOUNTER — Ambulatory Visit (INDEPENDENT_AMBULATORY_CARE_PROVIDER_SITE_OTHER): Payer: Medicaid Other | Admitting: Family

## 2016-08-03 ENCOUNTER — Encounter (INDEPENDENT_AMBULATORY_CARE_PROVIDER_SITE_OTHER): Payer: Self-pay | Admitting: Family

## 2016-08-03 VITALS — BP 118/70 | HR 84

## 2016-08-03 DIAGNOSIS — G40909 Epilepsy, unspecified, not intractable, without status epilepticus: Secondary | ICD-10-CM

## 2016-08-03 DIAGNOSIS — G40209 Localization-related (focal) (partial) symptomatic epilepsy and epileptic syndromes with complex partial seizures, not intractable, without status epilepticus: Secondary | ICD-10-CM | POA: Diagnosis not present

## 2016-08-03 DIAGNOSIS — G40309 Generalized idiopathic epilepsy and epileptic syndromes, not intractable, without status epilepticus: Secondary | ICD-10-CM | POA: Diagnosis not present

## 2016-08-03 DIAGNOSIS — F72 Severe intellectual disabilities: Secondary | ICD-10-CM | POA: Diagnosis not present

## 2016-08-03 DIAGNOSIS — G8250 Quadriplegia, unspecified: Secondary | ICD-10-CM | POA: Diagnosis not present

## 2016-08-03 MED ORDER — LEVETIRACETAM 100 MG/ML PO SOLN: ORAL | 5 refills | Status: DC

## 2016-08-03 MED ORDER — DIASTAT ACUDIAL 10 MG RE GEL: 5.0000 mg | Freq: Once | RECTAL | 5 refills | Status: AC

## 2016-08-03 MED ORDER — VIMPAT 10 MG/ML PO SOLN: ORAL | 5 refills | Status: DC

## 2016-08-03 MED ORDER — VALPROATE SODIUM 250 MG/5ML PO SOLN: ORAL | 5 refills | Status: DC
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
## 2016-08-03 NOTE — Patient Instructions (Signed)
Continue Megan [name]'s medications as you have been giving them.   Let me know if she has any seizures.   Please plan to return for follow up in 6 months or sooner if needed.

## 2016-08-19 ENCOUNTER — Telehealth: Payer: Self-pay | Admitting: Family Medicine

## 2016-08-19 NOTE — Telephone Encounter (Signed)
Forms were given to PCP at pt's last appointment for school and the school has not received them yet. Another copy of the forms will be dropped off this afternoon. ep

## 2016-08-20 NOTE — Telephone Encounter (Signed)
I am covering for D. McKeag this week. Unfortunately, there are numerous questions on this form that I cannot fill out from just reviewing her most recent clinic notes.  Will complete what I can and leave the rest for Dr. Alease Frame to try to fill out when he returns on Monday. I apologize for the delay.  Archie Patten, MD [facility] Resident  08/20/2016, 10:51 AM

## 2016-08-24 ENCOUNTER — Other Ambulatory Visit: Payer: Self-pay | Admitting: *Deleted

## 2016-08-24 DIAGNOSIS — K219 Gastro-esophageal reflux disease without esophagitis: Secondary | ICD-10-CM

## 2016-08-24 MED ORDER — RANITIDINE HCL 75 MG/5ML PO SYRP: ORAL_SOLUTION | ORAL | 10 refills | Status: DC

## 2016-08-25 NOTE — Telephone Encounter (Signed)
Called sight where forms are from. Was informed that these forms should have been completed prior to arriving here to be signed. They will be sending new (completed) forms to be signed.

## 2016-09-02 ENCOUNTER — Ambulatory Visit: Payer: Medicaid Other | Attending: Family Medicine | Admitting: Physical Therapy

## 2016-09-02 ENCOUNTER — Encounter: Payer: Self-pay | Admitting: Family Medicine

## 2016-09-02 ENCOUNTER — Ambulatory Visit (INDEPENDENT_AMBULATORY_CARE_PROVIDER_SITE_OTHER): Payer: Medicaid Other | Admitting: Family Medicine

## 2016-09-02 VITALS — BP 114/82 | HR 81 | Temp 98.2°F | Ht 59.0 in | Wt 110.0 lb

## 2016-09-02 DIAGNOSIS — R058 Other specified cough: Secondary | ICD-10-CM

## 2016-09-02 DIAGNOSIS — R05 Cough: Secondary | ICD-10-CM | POA: Diagnosis present

## 2016-09-02 DIAGNOSIS — G809 Cerebral palsy, unspecified: Secondary | ICD-10-CM

## 2016-09-02 DIAGNOSIS — R2689 Other abnormalities of gait and mobility: Secondary | ICD-10-CM | POA: Diagnosis present

## 2016-09-02 DIAGNOSIS — M6281 Muscle weakness (generalized): Secondary | ICD-10-CM

## 2016-09-02 NOTE — Progress Notes (Signed)
   Subjective: CC: cough CZY:[id number] Megan [name] is a 36 y.o. female presenting to clinic today for same day appointment. PCP: Elberta Leatherwood, MD Concerns today include:  1. Cough Grandmother reports that patient has had a productive cough for about 4 days.  She reports fever on Monday to 100.73F.  She gave her Motrin, which helped.  She reports some rhinorrhea.  Denies rashes, vomiting, diarrhea.  She eats through a G-tube.  No other sick contacts.  She reports some post tussive shortness of breath that lasts only a few seconds.  No wheeze.  Family seems to think that cough is getting a little better.  Allergies  Allergen Reactions  . Doxycycline Rash and Other (See Comments)    REACTION: Blisters / swelling  . Other Other (See Comments)    Seasonal Allergies      Social Hx reviewed: non smoker. MedHx, current medications and allergies reviewed.  Please see EMR. ROS: Per HPI  Objective: Office vital signs reviewed. BP 114/82   Pulse 81   Temp 98.2 F (36.8 C) (Oral)   Ht 4\' 11"  (1.499 m)   Wt 110 lb (49.9 kg)   SpO2 98%   BMI 22.22 kg/m   Physical Examination:  General: Awake, alert, cerebral palsy patient that is wheelchair bound, No acute distress, opens eyes to gentle stimulation. Accompanied by mother and grandmother. HEENT: Normal    Neck: No masses palpated. No lymphadenopathy    Eyes: PERRL, no conjunctival injection or ocular discharge.    Nose: nasal turbinates moist, no nasal discharge    Throat: MMM, mouth agape. Unable to evaluate o/p as patient unable to follow commands. Cardio: regular rate and rhythm, S1S2 heard, no murmurs appreciated Pulm: exam somewhat technically difficult 2/2 patient inability to follow commands but seemingly clear to auscultation bilaterally, no wheezes, rhonchi or rales; normal work of breathing on room air GI: soft, non-tender, non-distended, bowel sounds present x4, no hepatomegaly, no splenomegaly, no masses  Assessment/ Plan: 36  y.o. female   1. Productive cough.  Likely viral in nature but patient at risk for aspiration given CP status.  She is fed / meds administered strictly through G-tube.  I had considered Tessalon perles, which would be safe with her other conditions/ meds but after discussing with in house pharmacist, med unlikely to be beneficial administered through g-tube.  Encouraged to bulb suction nose if running.  Discussed repeat CXR with mother for possible pna but mother would like to hold off on this for now.  We discussed return precautions.   - Follow up with PCP in next week. - DG Chest 2 View; Future  2. Infantile cerebral palsy ([place]) - DG Chest 2 View; Future   Janora Norlander, DO PGY-3, [facility]

## 2016-09-02 NOTE — Therapy (Signed)
[facility] [address] [place], [place], [address] Phone: [phone]   Fax:  [phone]  Physical Therapy Evaluation  Patient Details  Name: Megan [name] MRN: [id number] Date of Birth: 06/16/1980 Referring Provider: Dr. Georges Lynch  Encounter Date: 09/02/2016      PT End of Session - 09/02/16 1714    Visit Number 1   Authorization Type Medicaid   PT Start Time 0802   PT Stop Time 0920   PT Time Calculation (min) 78 min      Past Medical History:  Diagnosis Date  . Allergic rhinitis 11/04/2012  . Cerebral palsy ([place])   . Cerebral palsy, quadriplegic ([place])   . CHF (congestive heart failure) ([place])   . COPD (chronic obstructive pulmonary disease) ([place])   . Cortical blindness   . Dilantin toxicity August 2014  . Hypertension 06/23/2010  . Mental retardation   . Pneumonia December 2014   Was intubated, [facility]  . Seizures ([place])   . Vision abnormalities     Past Surgical History:  Procedure Laterality Date  . IR GENERIC HISTORICAL  06/30/2016   IR REPLC GASTRO/COLONIC TUBE PERCUT W/FLUORO 06/30/2016 Marybelle Killings, MD [facility]INTER[facility]  . PEG PLACEMENT  09/03/2011   Procedure: PERCUTANEOUS ENDOSCOPIC GASTROSTOMY (PEG) REPLACEMENT;  Surgeon: Jerene Bears, MD;  Location: [facility];  Service: Gastroenterology;  Laterality: N/A;  . PEG PLACEMENT  02/19/2012   Procedure: PERCUTANEOUS ENDOSCOPIC GASTROSTOMY (PEG) REPLACEMENT;  Surgeon: Lafayette Dragon, MD;  Location: [facility];  Service: Endoscopy;  Laterality: N/A;  needs gastrostomy button 24 Fr 4.4 and 3.5 available  . PEG PLACEMENT N/A 05/30/2013   Procedure: PERCUTANEOUS ENDOSCOPIC GASTROSTOMY (PEG) REPLACEMENT;  Surgeon: Jerene Bears, MD;  Location: [facility];  Service: Gastroenterology;  Laterality: N/A;  . PEG PLACEMENT N/A 07/03/2013   Procedure: PERCUTANEOUS ENDOSCOPIC GASTROSTOMY (PEG) REPLACEMENT;  Surgeon: Lafayette Dragon, MD;  Location: [facility];  Service:  Endoscopy;  Laterality: N/A;  Replace with 20 Fr x 3.5 cm mini button-  . PEG TUBE PLACEMENT      There were no vitals filed for this visit.       Subjective Assessment - 09/02/16 1707    Subjective Pt presents in transport wheelchair propelled by her mother to PT for wheelchair evaluation - is totally dependent for wheelchair propulsion   Patient Stated Goals manual wheelchair evaluation   Currently in Pain? No/denies            [facility] PT Assessment - 09/02/16 0810      Assessment   Medical Diagnosis Cerebral Palsy   Referring Provider Dr. Georges Lynch   Onset Date/Surgical Date --  Congenital              Mobility/Seating Evaluation    PATIENT INFORMATION: Name: Megan [name] DOB: 1981/03/24  Sex: F Date seen: 09-02-16 Time: 0800  Address:  [address]                 [place], [place] [address] Physician: Georges Lynch This evaluation/justification form will serve as the LMN for the following suppliers: __________________________ Supplier: [employer] Contact Person: Glennis Brink Phone:  [phone]   Seating Therapist: Guido Sander, PT Phone:   [phone]   Phone: [phone]    Spouse/Parent/Caregiver name: ?????  Phone number: ????? Insurance/Payer: Medicaid     Reason for Referral: manual wheelchair evaluation  Patient/Caregiver Goals: obtain new manual wheelchair  Patient was seen for face-to-face evaluation for new  manual wheelchair.  Also present was Glennis Brink, ATP  to discuss recommendations and wheelchair options.  Further paperwork was completed and sent to vendor.  Patient appears to qualify for manual mobility device at this time per objective findings.   MEDICAL HISTORY: Diagnosis: Primary Diagnosis: Cerebral Palsy with Quadriplegia Onset: Congenital Diagnosis: ?????   '[]'$ Progressive Disease Relevant past and future surgeries: ?????   Height: 4'11" Weight: 115# Explain recent changes or trends in weight: ?????   History  including Falls: Pt has an Teaching laboratory technician wheelchair that she uses at [employer] and on [employer] bus only due to caregiver unable to transport this wheelchair in family vehicle; pt is using a transport wheelchair when transported by family    HOME ENVIRONMENT: '[x]'$ House  '[]'$ Condo/town home  '[]'$ Apartment  '[]'$ Assisted Living    '[]'$ Lives Alone '[x]'$  Lives with Others                                                                                          Hours with caregiver: 24  '[x]'$ Home is accessible to patient           Stairs      '[x]'$ Yes '[]'$  No     Ramp '[x]'$ Yes '[]'$ No Comments:  ?????   COMMUNITY ADL: TRANSPORTATION: '[x]'$ Car    '[]'$ Van    '[x]'$ Public Transportation    '[]'$ Adapted w/c Lift    '[]'$ Ambulance    '[]'$ Other:       '[]'$ Sits in wheelchair during transport  Employment/School: ????? Specific requirements pertaining to mobility ?????  Other: only able to use transport wheelchair with car transport; uses custom manual wheelchair on [employer]    FUNCTIONAL/SENSORY PROCESSING SKILLS:  Handedness:   '[]'$ Right     '[]'$ Left    '[]'$ NA  Comments:  ?????  Functional Processing Skills for Wheeled Mobility '[]'$ Processing Skills are adequate for safe wheelchair operation  Areas of concern than may interfere with safe operation of wheelchair Description of problem   '[]'$  Attention to environment      '[]'$ Judgment      '[]'$  Hearing  '[]'$  Vision or visual processing      '[]'$ Motor Planning  '[]'$  Fluctuations in Behavior  ?????    VERBAL COMMUNICATION: '[]'$ WFL receptive '[]'$  WFL expressive '[]'$ Understandable  '[]'$ Difficult to understand  '[x]'$ non-communicative '[]'$  Uses an augmented communication device  CURRENT SEATING / MOBILITY: Current Mobility Base:  '[]'$ None '[]'$ Dependent '[x]'$ Manual '[]'$ Scooter '[]'$ Power  Type of Control: ?????  Manufacturer:  SolaraSize:  ?????Age: ?????  Current Condition of Mobility Base:  in disrepair and and pt needs new seating components   Current Wheelchair components:  manual tilt in space  Describe posture in present  seating system:  ?????      SENSATION and SKIN ISSUES: Sensation '[]'$ Intact  '[]'$ Impaired '[]'$ Absent  Level of sensation: unable to accurately assess due to inability to communicate Pressure Relief: Able to perform effective pressure relief :    '[]'$ Yes  '[x]'$  No Method: ???? If not, Why?: ?????  Skin Issues/Skin Integrity Current Skin Issues  '[]'$ Yes '[x]'$ No '[]'$ Intact '[]'$  Red area'[]'$  Open Area  '[]'$ Scar Tissue '[]'$ At risk from prolonged sitting Where  ?????  History of Skin  Issues  '[]'$ Yes '[x]'$ No Where  ????? When  ?????  Hx of skin flap surgeries  '[]'$ Yes '[x]'$ No Where  ????? When  ?????  Limited sitting tolerance '[]'$ Yes '[x]'$ No Hours spent sitting in wheelchair daily: 6-8 hours/day  Complaint of Pain:  Please describe: None   Swelling/Edema: occcasional swelling in feet and hands   ADL STATUS (in reference to wheelchair use):  Indep Assist Unable Indep with Equip Not assessed Comments  Dressing ????? ????? X ????? ????? ?????  Eating ????? ????? X ????? ????? has a feeding tube with pump  Toileting ????? ????? X ????? ????? wears diapers  Bathing ????? ????? X ????? ????? ?????  Grooming/Hygiene ????? ????? X ????? ????? ?????  Meal Prep ????? ????? X ????? ????? ?????  IADLS ????? ????? X ????? ????? ?????  Bowel Management: '[]'$ Continent  '[x]'$ Incontinent  '[]'$ Accidents Comments:  ?????  Bladder Management: '[]'$ Continent  '[x]'$ Incontinent  '[]'$ Accidents Comments:  ?????     WHEELCHAIR SKILLS: Manual w/c Propulsion: '[]'$ UE or LE strength and endurance sufficient to participate in ADLs using manual wheelchair Arm : '[]'$ left '[]'$ right   '[]'$ Both      Distance: ????? Foot:  '[]'$ left '[]'$ right   '[]'$ Both  Operate Scooter: '[]'$  Strength, hand grip, balance and transfer appropriate for use '[]'$ Living environment is accessible for use of scooter  Operate Power w/c:  '[]'$  Std. Joystick   '[]'$  Alternative Controls Indep '[]'$  Assist '[]'$  Dependent/unable '[]'$  N/A '[]'$   '[]'$ Safe          '[]'$  Functional      Distance: ?????  Bed confined without  wheelchair '[x]'$  Yes '[]'$  No   STRENGTH/RANGE OF MOTION:  Passive Range of Motion Strength  Shoulder Rt shoulder flexion= 85 degrees:  abdct = 76 Lt shoulder flexion= 82 degrees:  abdct.= 65 2-/5 bil. UE's  Elbow R elbow ext. = -42 degrees L = -36 degrees 2+ - 3-/5  bil. UE's  Wrist/Hand bil. wrists flexed; fingers flexed but able to passively extend bil. wrists to neurtral and able to passively extend fingers bil. hands 1/5 bil. UE's  Hip Internal rotation to neutral passivelyl 2-/5 bil. LE's  Knee Rt knee extension = -34 degrees Lt knee extension = -48 degrees  0-1/5 bil. LE's  Ankle passive dorsiflexion bil. to neutral 0/5 bil. LE's     MOBILITY/BALANCE:  '[]'$  Patient is totally dependent for mobility  ?????    Balance Transfers Ambulation  Sitting Balance: Standing Balance: '[]'$  Independent '[]'$  Independent/Modified Independent  '[]'$  WFL     '[]'$  WFL '[]'$  Supervision '[]'$  Supervision  '[]'$  Uses UE for balance  '[]'$  Supervision '[]'$  Min Assist '[]'$  Ambulates with Assist  ?????    '[]'$  Min Assist '[]'$  Min assist '[]'$  Mod Assist '[]'$  Ambulates with Device:      '[]'$  RW  '[]'$  StW  '[]'$  Cane  '[]'$  ?????  '[x]'$  Mod Assist '[]'$  Mod assist '[]'$  Max assist   '[]'$  Max Assist '[]'$  Max assist '[x]'$  Dependent '[]'$  Indep. Short Distance Only  '[]'$  Unable '[x]'$  Unable '[]'$  Lift / Sling Required Distance (in feet)  ?????   '[]'$  Sliding board '[x]'$  Unable to Ambulate (see explanation below)  Cardio Status:  '[x]'$ Intact  '[]'$  Impaired   '[]'$  NA     ?????  Respiratory Status:  '[x]'$ Intact   '[]'$ Impaired   '[]'$ NA     ?????  Orthotics/Prosthetics: ?????  Comments (Address manual vs power w/c vs scooter): ?????         Anterior / Posterior Obliquity Rotation-Pelvis ?????  PELVIS    '[]'$  [  x] '[]'$   Neutral Posterior Anterior  '[x]'$  '[]'$  '[]'$   WFL Rt elev Lt elev  '[x]'$  '[]'$  '[]'$   WFL Right Left                      Anterior    Anterior     '[]'$  Fixed '[]'$  Other '[x]'$  Partly Flexible '[]'$  Flexible   '[]'$  Fixed '[]'$  Other '[]'$  Partly Flexible  '[x]'$  Flexible  '[]'$  Fixed '[]'$  Other '[]'$  Partly  Flexible  '[x]'$  Flexible   TRUNK  '[x]'$  '[]'$  '[]'$   WFL ? Thoracic ? Lumbar  Kyphosis Lordosis  '[]'$  '[]'$  '[x]'$   WFL Convex Convex  Right Left '[x]'$ c-curve '[]'$ s-curve '[]'$ multiple  '[x]'$  Neutral '[]'$  Left-anterior '[]'$  Right-anterior     '[]'$  Fixed '[]'$  Flexible '[x]'$  Partly Flexible '[]'$  Other  '[]'$  Fixed '[]'$  Flexible '[x]'$  Partly Flexible '[]'$  Other  '[]'$  Fixed             '[]'$  Flexible '[x]'$  Partly Flexible '[]'$  Other    Position Windswept  ?????  HIPS          '[]'$            '[x]'$               '[]'$    Neutral       Abduct        ADduct         '[x]'$           '[]'$            '[]'$   Neutral Right           Left      '[]'$  Fixed '[]'$  Subluxed '[x]'$  Partly Flexible '[]'$  Dislocated '[]'$  Flexible  '[]'$  Fixed '[]'$  Other '[x]'$  Partly Flexible  '[]'$  Flexible                 Foot Positioning Knee Positioning  ?????    '[x]'$  WFL  '[x]'$ Lt '[x]'$ Rt '[x]'$  WFL  '[]'$ Lt '[]'$ Rt    KNEES ROM concerns: ROM concerns:    & Dorsi-Flexed '[]'$ Lt '[]'$ Rt bil. knee flexion contractures    FEET Plantar Flexed '[]'$ Lt '[]'$ Rt      Inversion                 '[]'$ Lt '[]'$ Rt      Eversion                 '[]'$ Lt '[]'$ Rt     HEAD '[]'$  Functional '[]'$  Good Head Control  ?????  & '[]'$  Flexed         '[]'$  Extended '[]'$  Adequate Head Control    NECK '[]'$  Rotated  Lt  '[]'$  Lat Flexed Lt '[]'$  Rotated  Rt '[x]'$  Lat Flexed Rt '[x]'$  Limited Head Control     '[]'$  Cervical Hyperextension '[]'$  Absent  Head Control     SHOULDERS ELBOWS WRIST& HAND bil. wrists and fingers flexed      Left     Right    Left     Right    Left     Right   U/E '[]'$ Functional           '[]'$ Functional WFL's  WFL's '[]'$ Fisting             '[]'$ Fisting      '[x]'$ elev   '[]'$ dep      '[]'$ elev   '[]'$ dep       '[x]'$ pro -'[]'$ retract     '[x]'$ pro  '[]'$ retract '[]'$ subluxed             '[]'$   subluxed           Goals for Wheelchair Mobility  '[]'$  Independence with mobility in the home with motor related ADLs (MRADLs)  '[]'$  Independence with MRADLs in the community '[x]'$  Provide dependent mobility  '[x]'$  Provide recline     '[x]'$ Provide tilt   Goals for Seating system '[x]'$  Optimize pressure distribution '[x]'$   Provide support needed to facilitate function or safety '[x]'$  Provide corrective forces to assist with maintaining or improving posture '[]'$  Accommodate client's posture:   current seated postures and positions are not flexible or will not tolerate corrective forces '[]'$  Client to be independent with relieving pressure in the wheelchair '[x]'$ Enhance physiological function such as breathing, swallowing, digestion  Simulation ideas/Equipment trials:????? State why other equipment was unsuccessful:?????   MOBILITY BASE RECOMMENDATIONS and JUSTIFICATION: MOBILITY COMPONENT JUSTIFICATION  Manufacturer: [employer]areModel: Solara   Size: Width 18Seat Depth 20 '[x]'$ provide transport from point A to B      '[x]'$ promote Indep mobility  '[x]'$ is not a safe, functional ambulator '[x]'$ walker or cane inadequate '[]'$ non-standard width/depth necessary to accommodate anatomical measurement '[]'$  ?????  '[x]'$ Manual Mobility Base '[x]'$ non-functional ambulator    '[]'$ Scooter/POV  '[]'$ can safely operate  '[]'$ can safely transfer   '[]'$ has adequate trunk stability  '[]'$ cannot functionally propel manual w/c  '[]'$ Power Mobility Base  '[]'$ non-ambulatory  '[]'$ cannot functionally propel manual wheelchair  '[]'$  cannot functionally and safely operate scooter/POV '[]'$ can safely operate and willing to  '[]'$ Stroller Base '[]'$ infant/child  '[]'$ unable to propel manual wheelchair '[]'$ allows for growth '[]'$ non-functional ambulator '[]'$ non-functional UE '[]'$ Indep mobility is not a goal at this time  '[x]'$ Tilt  '[]'$ Forward '[x]'$ Backward '[]'$ Powered tilt  '[x]'$ Manual tilt  '[x]'$ change position against gravitational force on head and shoulders  '[x]'$ change position for pressure relief/cannot weight shift '[x]'$ transfers  '[x]'$ management of tone '[x]'$ rest periods '[]'$ control edema '[x]'$ facilitate postural control  '[]'$  ?????  '[]'$ Recline  '[]'$ Power recline on power base '[]'$ Manual recline on manual base  '[]'$ accommodate femur to back angle  '[]'$ bring to full recline for ADL care  '[]'$ change position for pressure  relief/cannot weight shift '[]'$ rest periods '[]'$ repositioning for transfers or clothing/diaper /catheter changes '[]'$ head positioning  '[]'$ Lighter weight required '[]'$ self- propulsion  '[]'$ lifting '[]'$  ?????  '[]'$ Heavy Duty required '[]'$ user weight greater than 250# '[]'$ extreme tone/ over active movement '[]'$ broken frame on previous chair '[]'$  ?????  '[x]'$  Back  '[]'$  Angle Adjustable '[]'$  Custom molded Planar back '[x]'$ postural control '[x]'$ control of tone/spasticity '[]'$ accommodation of range of motion '[]'$ UE functional control '[]'$ accommodation for seating system '[]'$  ????? '[]'$ provide lateral trunk support '[]'$ accommodate deformity '[x]'$ provide posterior trunk support '[x]'$ provide lumbar/sacral support '[x]'$ support trunk in midline '[x]'$ Pressure relief over spinal processes  '[x]'$  Seat Cushion ????? '[]'$ impaired sensation  '[]'$ decubitus ulcers present '[]'$ history of pressure ulceration '[x]'$ prevent pelvic extension '[x]'$ low maintenance  '[x]'$ stabilize pelvis  '[]'$ accommodate obliquity '[]'$ accommodate multiple deformity '[x]'$ neutralize lower extremity position '[x]'$ increase pressure distribution '[]'$  ?????  '[x]'$  Pelvic/thigh support  '[]'$  Lateral thigh guide '[]'$  Distal medial pad  '[x]'$  Distal lateral pad '[]'$  pelvis in neutral '[]'$ accommodate pelvis '[x]'$  position upper legs '[x]'$  alignment '[]'$  accommodate ROM '[]'$  decr adduction '[]'$ accommodate tone '[]'$ removable for transfers '[x]'$ decr abduction  '[x]'$  Lateral trunk Supports '[x]'$  Lt     '[x]'$  Rt '[x]'$ decrease lateral trunk leaning '[x]'$ control tone '[x]'$ contour for increased contact '[x]'$ safety  '[]'$ accommodate asymmetry '[]'$  ?????  '[x]'$  Mounting hardware  '[x]'$ lateral trunk supports  '[x]'$ back   '[]'$ seat '[x]'$ headrest      '[]'$  thigh support '[]'$ fixed   '[]'$ swing away '[]'$ attach seat platform/cushion to w/c frame '[x]'$ attach back cushion to w/c frame '[]'$ mount postural supports '[x]'$ mount headrest  '[]'$ swing medial thigh  support away '[x]'$ swing lateral supports away for transfers  '[]'$  ?????    Armrests  '[]'$ fixed '[x]'$ adjustable height '[x]'$ removable    '[]'$ swing away  '[]'$ flip back   '[]'$ reclining '[x]'$ full length pads '[]'$ desk    '[]'$ pads tubular  '[x]'$ provide support with elbow at 90   '[]'$ provide support for w/c tray '[x]'$ change of height/angles for variable activities '[x]'$ remove for transfers '[]'$ allow to come closer to table top '[x]'$ remove for access to tables '[]'$  ?????  Hangers/ Leg rests  '[]'$ 60 '[x]'$ 70 '[]'$ 90 '[]'$ elevating '[]'$ heavy duty  '[]'$ articulating '[]'$ fixed '[]'$ lift off '[x]'$ swing away     '[]'$ power '[x]'$ provide LE support  '[]'$ accommodate to hamstring tightness '[]'$ elevate legs during recline   '[]'$ provide change in position for Legs '[x]'$ Maintain placement of feet on footplate '[]'$ durability '[]'$ enable transfers '[]'$ decrease edema '[]'$ Accommodate lower leg length '[]'$  ?????  Foot support Footplate    '[x]'$ Lt  '[x]'$  Rt  '[]'$  Center mount '[]'$ flip up     '[x]'$ depth/angle adjustable '[]'$ Amputee adapter    '[]'$  Lt     '[]'$  Rt '[x]'$ provide foot support '[x]'$ accommodate to ankle ROM '[]'$ transfers '[]'$ Provide support for residual extremity '[]'$  allow foot to go under wheelchair base '[]'$  decrease tone  '[]'$  ?????  '[]'$  Ankle strap/heel loops '[]'$ support foot on foot support '[]'$ decrease extraneous movement '[]'$ provide input to heel  '[]'$ protect foot  Tires: '[]'$ pneumatic  '[x]'$ flat free inserts  '[]'$ solid  '[x]'$ decrease maintenance  '[x]'$ prevent frequent flats '[]'$ increase shock absorbency '[]'$ decrease pain from road shock '[]'$ decrease spasms from road shock '[]'$  ?????  '[x]'$  Headrest  '[x]'$ provide posterior head support '[x]'$ provide posterior neck support '[]'$ provide lateral head support '[]'$ provide anterior head support '[x]'$ support during tilt and recline '[]'$ improve feeding   '[]'$ improve respiration '[]'$ placement of switches '[x]'$ safety  '[]'$ accommodate ROM  '[]'$ accommodate tone '[]'$ improve visual orientation  '[]'$  Anterior chest strap '[]'$  Vest '[]'$  Shoulder retractors  '[]'$ decrease forward movement of shoulder '[]'$ accommodation of TLSO '[]'$ decrease forward movement of trunk '[]'$ decrease shoulder elevation '[]'$ added abdominal  support '[]'$ alignment '[]'$ assistance with shoulder control  '[]'$  ?????  Pelvic Positioner '[x]'$ Belt '[]'$ SubASIS bar '[]'$ Dual Pull '[x]'$ stabilize tone '[x]'$ decrease falling out of chair/ **will not Decr potential for sliding due to pelvic tilting '[]'$ prevent excessive rotation '[]'$ pad for protection over boney prominence '[]'$ prominence comfort '[]'$ special pull angle to control rotation '[x]'$  4 point pelvic harness  Upper Extremity Support '[]'$ L   '[]'$  R '[]'$ Arm trough    '[]'$ hand support '[]'$  tray       '[]'$ full tray '[]'$ swivel mount '[]'$ decrease edema      '[]'$ decrease subluxation   '[]'$ control tone   '[]'$ placement for AAC/Computer/EADL '[]'$ decrease gravitational pull on shoulders '[]'$ provide midline positioning '[]'$ provide support to increase UE function '[]'$ provide hand support in natural position '[]'$ provide work surface   POWER WHEELCHAIR CONTROLS  '[]'$ Proportional  '[]'$ Non-Proportional Type ????? '[]'$ Left  '[]'$ Right '[]'$ provides access for controlling wheelchair   '[]'$ lacks motor control to operate proportional drive control '[]'$ unable to understand proportional controls  Actuator Control Module  '[]'$ Single  '[]'$ Multiple   '[]'$ Allow the client to operate the power seat function(s) through the joystick control   '[]'$ Safety Reset Switches '[]'$ Used to change modes and stop the wheelchair when driving in latch mode    '[]'$ Upgraded Electronics   '[]'$ programming for accurate control '[]'$ progressive Disease/changing condition '[]'$ non-proportional drive control needed '[]'$ Needed in order to operate power seat functions through joystick control   '[]'$ Display box '[]'$ Allows user to see in which mode and drive the wheelchair is set  '[]'$ necessary for alternate controls    '[]'$ Digital interface electronics '[]'$ Allows w/c to operate when using alternative drive controls  '[]'$ ASL Head Array '[]'$ Allows client to operate wheelchair  through switches placed in tri-panel headrest  '[]'$ Sip and puff with tubing kit '[]'$ needed to operate sip and puff drive controls  '[]'$ Upgraded tracking  electronics '[]'$ increase safety when driving '[]'$ correct tracking when on uneven surfaces  '[]'$ Mount for switches or joystick '[]'$ Attaches switches to w/c  '[]'$ Swing away for access or transfers '[]'$ midline for optimal placement '[]'$ provides for consistent access  '[]'$ Attendant controlled joystick plus mount '[]'$ safety '[]'$ long distance driving '[]'$ operation of seat functions '[]'$ compliance with transportation regulations '[]'$  ?????    Rear wheel placement/Axle adjustability '[]'$ None '[]'$ semi adjustable '[]'$ fully adjustable  '[]'$ improved UE access to wheels '[]'$ improved stability '[]'$ changing angle in space for improvement of postural stability '[]'$ 1-arm drive access '[]'$ amputee pad placement '[]'$  ?????  Wheel rims/ hand rims  '[]'$ metal  '[]'$ plastic coated '[]'$ oblique projections '[]'$ vertical projections '[]'$ Provide ability to propel manual wheelchair  '[]'$  Increase self-propulsion with hand weakness/decreased grasp  Push handles '[]'$ extended  '[]'$ angle adjustable  '[]'$ standard '[]'$ caregiver access '[]'$ caregiver assist '[]'$ allows "hooking" to enable increased ability to perform ADLs or maintain balance  One armed device  '[]'$ Lt   '[]'$ Rt '[]'$ enable propulsion of manual wheelchair with one arm   '[]'$  ?????   Brake/wheel lock extension '[]'$  Lt   '[]'$  Rt '[]'$ increase indep in applying wheel locks   '[]'$ Side guards '[]'$ prevent clothing getting caught in wheel or becoming soiled '[]'$  prevent skin tears/abrasions  Battery: ????? '[]'$ to power wheelchair ?????  Other: Shoe holders                           Swing away unilateral facial     prompt  To properly position and secure feet on foot rests  To properly position head in neutral and to decrease Rt lateral lean of head ?????  The above equipment has a life- long use expectancy. Growth and changes in medical and/or functional conditions would be the exceptions. This is to certify that the therapist has no financial relationship with durable medical provider or manufacturer. The therapist will not receive remuneration of any  kind for the equipment recommended in this evaluation.   Patient has mobility limitation that significantly impairs safe, timely participation in one or more mobility related ADL's.  (bathing, toileting, feeding, dressing, grooming, moving from room to room)                                                             '[x]'$  Yes '[]'$  No Will mobility device sufficiently improve ability to participate and/or be aided in participation of MRADL's?         '[x]'$  Yes '[]'$  No Can limitation be compensated for with use of a cane or walker?                                                                                '[]'$  Yes '[x]'$  No Does patient or caregiver demonstrate ability/potential ability & willingness to safely use the mobility device?   '[x]'$  Yes '[]'$  No Does patient's home environment support use of recommended mobility  device?                                                    '[x]'$  Yes '[]'$  No Does patient have sufficient upper extremity function necessary to functionally propel a manual wheelchair?    '[]'$  Yes '[x]'$  No Does patient have sufficient strength and trunk stability to safely operate a POV (scooter)?                                  '[]'$  Yes '[x]'$  No Does patient need additional features/benefits provided by a power wheelchair for MRADL's in the home?       '[]'$  Yes '[]'$  No Does the patient demonstrate the ability to safely use a power wheelchair?                                                              '[]'$  Yes '[]'$  No  Therapist Name Printed: Guido Sander, PT Date: 09-02-16  Therapist's Signature:   Date:   Supplier's Name Printed: [employer] Date: 09-02-16  Supplier's Signature:   Date:  Patient/Caregiver Signature:   Date:     This is to certify that I have read this evaluation and do agree with the content within:      Physician's Name Printed: Dr. Georges Lynch  Physician's Signature:  Date:     This is to certify that I, the above signed therapist have the following affiliations: '[]'$  This  DME provider '[]'$  Manufacturer of recommended equipment '[]'$  Patient's long term care facility '[x]'$  None of the above                               Plan - 09/02/16 1715    Clinical Impression Statement Pt presents for manual tilt in space wheelchair - vendor Glennis Brink, [employer] from [place] present for eval: recommend [employer]ara tilt in space   PT Frequency One time visit   PT Treatment/Interventions --  wheelchair management   Consulted and Agree with Plan of Care Family member/caregiver;Patient   Family Member Consulted mother      Patient will benefit from skilled therapeutic intervention in order to improve the following deficits and impairments:  Decreased mobility  Visit Diagnosis: Muscle weakness (generalized) - Plan: PT plan of care cert/re-cert  Other abnormalities of gait and mobility - Plan: PT plan of care cert/re-cert     Problem List Patient Active Problem List   Diagnosis Date Noted  . Healthcare maintenance 07/08/2016  . Hypoxia   . Pneumonia 02/23/2016  . 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 Dysphagia 02/12/2014  . 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 Cough 01/26/2014  . Localization-related symptomatic epilepsy and epileptic syndromes with complex partial seizures, not intractable, without status epilepticus ([place]) 08/23/2013  . Unspecified constipation 04/18/2013  . Fibroid 04/18/2013  . Abnormal mental state 03/27/2013  . Seizure disorder ([place]) 03/27/2013  . Allergic rhinitis 11/04/2012  . 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 Encounter for long-term (current) use of other medications 10/19/2012  .  Severe intellectual disabilities 10/19/2012  . 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 Feeding difficulties and mismanagement 02/19/2012  . Hypothyroidism 06/25/2010  . Hypertension 06/23/2010  . Infantile cerebral palsy ([place]) 02/21/2010  . Blindness of both eyes  02/21/2010    Megan [name], [place], PT 09/02/2016, 5:26 PM  [facility] [address] [place], [place], [address] Phone: [phone]   Fax:  [phone]  Name: Megan [name] MRN: [id number] Date of Birth: 01-02-1981

## 2016-09-02 NOTE — Patient Instructions (Signed)
This is likely a viral illness.  However, she is at risk for pneumonia given her cerebral palsy. I have ordered an xray to evaluate for possible pneumonia.  I will call you as soon as I have results.  In the meantime, treat her supportively with Tylenol as needed for fever/ pain.  You can bulb suction her nose if it is running.

## 2016-09-14 ENCOUNTER — Ambulatory Visit: Payer: Medicaid Other | Admitting: Family Medicine

## 2016-09-14 ENCOUNTER — Ambulatory Visit (INDEPENDENT_AMBULATORY_CARE_PROVIDER_SITE_OTHER): Payer: Medicaid Other | Admitting: Internal Medicine

## 2016-09-14 ENCOUNTER — Encounter: Payer: Self-pay | Admitting: Internal Medicine

## 2016-09-14 ENCOUNTER — Ambulatory Visit
Admission: RE | Admit: 2016-09-14 | Discharge: 2016-09-14 | Disposition: A | Discharge status: Home / Self Care | Payer: Medicaid Other | Source: Ambulatory Visit | Attending: Family Medicine | Admitting: Family Medicine

## 2016-09-14 ENCOUNTER — Other Ambulatory Visit: Payer: Self-pay | Admitting: Family Medicine

## 2016-09-14 VITALS — BP 110/78 | Temp 98.2°F | Ht 59.0 in

## 2016-09-14 DIAGNOSIS — R05 Cough: Secondary | ICD-10-CM

## 2016-09-14 DIAGNOSIS — R059 Cough, unspecified: Secondary | ICD-10-CM

## 2016-09-14 DIAGNOSIS — R058 Other specified cough: Secondary | ICD-10-CM

## 2016-09-14 DIAGNOSIS — G809 Cerebral palsy, unspecified: Secondary | ICD-10-CM

## 2016-09-14 NOTE — Progress Notes (Signed)
   Subjective:    Megan [name] - 36 y.o. female MRN [id number]  Date of birth: [birth year]  HPI  Megan [name] is here for follow up of cough.  Cough: Seen at [facility] on 5/9 for four day h/o productive cough with fever to 100.5 degrees.  Suspected that cough was viral in nature but given increased risk of aspiration due to CP, CXR was recommended. Family has not yet completed this CXR.   Today, they report cough has improved. It is sometimes productive of clear drainage. She has had mild nasal drainage and sneezing. Has been afebrile since last OV. She has been tolerating G-tube feeds well. No SOB, wheezing or respiratory distress. No vomiting or diarrhea. She has a history of allergies for which she is given Claritin.    -  reports that she has never smoked. She has never used smokeless tobacco. - Review of Systems: Per HPI. - Past Medical History: Patient Active Problem List   Diagnosis Date Noted  . Healthcare maintenance 07/08/2016  . Hypoxia   . Pneumonia 02/23/2016  . 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 Dysphagia 02/12/2014  . 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 Cough 01/26/2014  . Localization-related symptomatic epilepsy and epileptic syndromes with complex partial seizures, not intractable, without status epilepticus ([place]) 08/23/2013  . Unspecified constipation 04/18/2013  . Fibroid 04/18/2013  . Abnormal mental state 03/27/2013  . Seizure disorder ([place]) 03/27/2013  . Allergic rhinitis 11/04/2012  . 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 Encounter for long-term (current) use of other medications 10/19/2012  . Severe intellectual disabilities 10/19/2012  . 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 Feeding difficulties and mismanagement 02/19/2012  . Hypothyroidism 06/25/2010  . Hypertension 06/23/2010  . Infantile cerebral palsy ([place]) 02/21/2010  . Blindness of both eyes 02/21/2010   - Medications: reviewed and  updated   Objective:   Physical Exam BP 110/78   Temp 98.2 F (36.8 C) (Oral)   Ht 4\' 11"  (1.499 m)  Gen: NAD, alert, awake, CP patient wheelchair bound  HEENT: NCAT, PERRL, clear conjunctiva, unable to evaluate OP as patient unable to follow commands, mouth agape with MMM, nasal turbinates with mild amount crusted drainage CV: RRR, good S1/S2, no murmur, no edema, capillary refill brisk  Resp: exam somewhat difficult as patient unable to follow commands and is making noises but seemingly CTAB without wheezes, rhonchi, or rales; normal WOB on RA  Abd: SNTND, BS present, no guarding or organomegaly  Assessment & Plan:   Cough Suspect cough is viral in nature or related to allergic rhinitis as cough is mostly non-productive or clear sputum, patient has been afebrile, and has associated nasal drainage. However, given increased risk of aspiration with CP have asked family to go for CXR today to ensure cough not related to PNA. Unable to obtain pulse ox on patient, but comfortable WOB, clear lung exam, and no signs of cyanosis on exam. Have recommended continued use of Claritin, nasal suction bulb/nasal saline drops for nasal drainage, and incentive spirometry. Will call with CXR results. Follow up with PCP as needed.     Phill Myron, D.O. 09/14/2016, 9:36 AM PGY-2, [place]

## 2016-09-14 NOTE — Assessment & Plan Note (Signed)
Suspect cough is viral in nature or related to allergic rhinitis as cough is mostly non-productive or clear sputum, patient has been afebrile, and has associated nasal drainage. However, given increased risk of aspiration with CP have asked family to go for CXR today to ensure cough not related to PNA. Unable to obtain pulse ox on patient, but comfortable WOB, clear lung exam, and no signs of cyanosis on exam. Have recommended continued use of Claritin, nasal suction bulb/nasal saline drops for nasal drainage, and incentive spirometry. Will call with CXR results. Follow up with PCP as needed.

## 2016-09-14 NOTE — Patient Instructions (Signed)
Please get the Chest Xray today and I will call you with the results. Please return if cough worsens with any signs of respiratory distress or if she has fevers, stops tolerating feeds, etc.   Take Care,   Dr. Juleen China

## 2016-09-16 ENCOUNTER — Telehealth: Payer: Self-pay | Admitting: Family Medicine

## 2016-09-16 NOTE — Telephone Encounter (Signed)
Would like results from [facility].

## 2016-09-17 NOTE — Telephone Encounter (Signed)
Called to discuss results of CXR. No new infiltrates or consolidation suggestive of PNA. Infiltrates and atelectasis present on comparison film from 3/14 showed improvement.   Phill Myron, D.O. 09/17/2016, 10:39 AM PGY-2, [place]

## 2016-11-17 ENCOUNTER — Other Ambulatory Visit (INDEPENDENT_AMBULATORY_CARE_PROVIDER_SITE_OTHER): Payer: Self-pay | Admitting: Family

## 2016-11-17 DIAGNOSIS — G40309 Generalized idiopathic epilepsy and epileptic syndromes, not intractable, without status epilepticus: Secondary | ICD-10-CM

## 2016-11-17 DIAGNOSIS — G40209 Localization-related (focal) (partial) symptomatic epilepsy and epileptic syndromes with complex partial seizures, not intractable, without status epilepticus: Secondary | ICD-10-CM

## 2016-12-27 ENCOUNTER — Other Ambulatory Visit: Payer: Self-pay | Admitting: Pediatrics

## 2017-01-11 ENCOUNTER — Inpatient Hospital Stay (HOSPITAL_COMMUNITY)
Admission: AD | Admit: 2017-01-11 | Discharge: 2017-01-15 | DRG: 177 | Disposition: A | Discharge status: Home / Self Care | Payer: Medicaid Other | Source: Ambulatory Visit | Attending: Family Medicine | Admitting: Family Medicine

## 2017-01-11 ENCOUNTER — Observation Stay (HOSPITAL_COMMUNITY): Payer: Medicaid Other

## 2017-01-11 ENCOUNTER — Ambulatory Visit (INDEPENDENT_AMBULATORY_CARE_PROVIDER_SITE_OTHER): Payer: Medicaid Other | Admitting: Student

## 2017-01-11 ENCOUNTER — Encounter: Payer: Self-pay | Admitting: Student

## 2017-01-11 ENCOUNTER — Encounter (HOSPITAL_COMMUNITY): Payer: Self-pay | Admitting: General Practice

## 2017-01-11 VITALS — BP 108/72 | Temp 98.5°F | Wt 110.0 lb

## 2017-01-11 DIAGNOSIS — R1310 Dysphagia, unspecified: Secondary | ICD-10-CM | POA: Diagnosis present

## 2017-01-11 DIAGNOSIS — Z931 Gastrostomy status: Secondary | ICD-10-CM | POA: Diagnosis not present

## 2017-01-11 DIAGNOSIS — J449 Chronic obstructive pulmonary disease, unspecified: Secondary | ICD-10-CM | POA: Diagnosis present

## 2017-01-11 DIAGNOSIS — G40909 Epilepsy, unspecified, not intractable, without status epilepticus: Secondary | ICD-10-CM | POA: Diagnosis not present

## 2017-01-11 DIAGNOSIS — J309 Allergic rhinitis, unspecified: Secondary | ICD-10-CM | POA: Diagnosis present

## 2017-01-11 DIAGNOSIS — J069 Acute upper respiratory infection, unspecified: Secondary | ICD-10-CM

## 2017-01-11 DIAGNOSIS — Z8249 Family history of ischemic heart disease and other diseases of the circulatory system: Secondary | ICD-10-CM

## 2017-01-11 DIAGNOSIS — J9601 Acute respiratory failure with hypoxia: Secondary | ICD-10-CM | POA: Diagnosis present

## 2017-01-11 DIAGNOSIS — Z8701 Personal history of pneumonia (recurrent): Secondary | ICD-10-CM

## 2017-01-11 DIAGNOSIS — E860 Dehydration: Secondary | ICD-10-CM | POA: Diagnosis present

## 2017-01-11 DIAGNOSIS — D7589 Other specified diseases of blood and blood-forming organs: Secondary | ICD-10-CM | POA: Diagnosis present

## 2017-01-11 DIAGNOSIS — G8220 Paraplegia, unspecified: Secondary | ICD-10-CM | POA: Diagnosis present

## 2017-01-11 DIAGNOSIS — F72 Severe intellectual disabilities: Secondary | ICD-10-CM | POA: Diagnosis present

## 2017-01-11 DIAGNOSIS — G800 Spastic quadriplegic cerebral palsy: Secondary | ICD-10-CM

## 2017-01-11 DIAGNOSIS — K219 Gastro-esophageal reflux disease without esophagitis: Secondary | ICD-10-CM | POA: Diagnosis present

## 2017-01-11 DIAGNOSIS — R0902 Hypoxemia: Secondary | ICD-10-CM | POA: Diagnosis present

## 2017-01-11 DIAGNOSIS — G808 Other cerebral palsy: Secondary | ICD-10-CM | POA: Diagnosis present

## 2017-01-11 DIAGNOSIS — J690 Pneumonitis due to inhalation of food and vomit: Principal | ICD-10-CM | POA: Diagnosis present

## 2017-01-11 DIAGNOSIS — G40209 Localization-related (focal) (partial) symptomatic epilepsy and epileptic syndromes with complex partial seizures, not intractable, without status epilepticus: Secondary | ICD-10-CM | POA: Diagnosis present

## 2017-01-11 DIAGNOSIS — N179 Acute kidney failure, unspecified: Secondary | ICD-10-CM | POA: Diagnosis present

## 2017-01-11 DIAGNOSIS — Z22322 Carrier or suspected carrier of Methicillin resistant Staphylococcus aureus: Secondary | ICD-10-CM

## 2017-01-11 DIAGNOSIS — D539 Nutritional anemia, unspecified: Secondary | ICD-10-CM | POA: Diagnosis present

## 2017-01-11 DIAGNOSIS — Z881 Allergy status to other antibiotic agents status: Secondary | ICD-10-CM

## 2017-01-11 DIAGNOSIS — B9789 Other viral agents as the cause of diseases classified elsewhere: Secondary | ICD-10-CM

## 2017-01-11 DIAGNOSIS — E039 Hypothyroidism, unspecified: Secondary | ICD-10-CM | POA: Diagnosis present

## 2017-01-11 DIAGNOSIS — H543 Unqualified visual loss, both eyes: Secondary | ICD-10-CM | POA: Diagnosis present

## 2017-01-11 LAB — CBC WITH DIFFERENTIAL/PLATELET
BASOS ABS: 0 10*3/uL (ref 0.0–0.1)
BASOS PCT: 0 %
EOS ABS: 0 10*3/uL (ref 0.0–0.7)
Eosinophils Relative: 0 %
HCT: 29.8 % — ABNORMAL LOW (ref 36.0–46.0)
Hemoglobin: 9.2 g/dL — ABNORMAL LOW (ref 12.0–15.0)
LYMPHS ABS: 1.7 10*3/uL (ref 0.7–4.0)
LYMPHS PCT: 12 %
MCH: 33.7 pg (ref 26.0–34.0)
MCHC: 30.9 g/dL (ref 30.0–36.0)
MCV: 109.2 fL — AB (ref 78.0–100.0)
MONO ABS: 3.3 10*3/uL — AB (ref 0.1–1.0)
Monocytes Relative: 23 %
NEUTROS ABS: 9.5 10*3/uL — AB (ref 1.7–7.7)
Neutrophils Relative %: 65 %
PLATELETS: 207 10*3/uL (ref 150–400)
RBC: 2.73 MIL/uL — ABNORMAL LOW (ref 3.87–5.11)
RDW: 17.9 % — AB (ref 11.5–15.5)
WBC MORPHOLOGY: INCREASED
WBC: 14.5 10*3/uL — ABNORMAL HIGH (ref 4.0–10.5)

## 2017-01-11 LAB — COMPREHENSIVE METABOLIC PANEL
ALT: 15 U/L (ref 14–54)
AST: 61 U/L — AB (ref 15–41)
Albumin: 2.6 g/dL — ABNORMAL LOW (ref 3.5–5.0)
Alkaline Phosphatase: 100 U/L (ref 38–126)
Anion gap: 10 (ref 5–15)
BILIRUBIN TOTAL: 1.4 mg/dL — AB (ref 0.3–1.2)
BUN: 21 mg/dL — AB (ref 6–20)
CALCIUM: 8.9 mg/dL (ref 8.9–10.3)
CHLORIDE: 102 mmol/L (ref 101–111)
CO2: 25 mmol/L (ref 22–32)
CREATININE: 1.48 mg/dL — AB (ref 0.44–1.00)
GFR calc Af Amer: 51 mL/min — ABNORMAL LOW (ref >60)
GFR calc non Af Amer: 44 mL/min — ABNORMAL LOW (ref >60)
Glucose, Bld: 148 mg/dL — ABNORMAL HIGH (ref 65–99)
Potassium: 5.6 mmol/L — ABNORMAL HIGH (ref 3.5–5.1)
Sodium: 137 mmol/L (ref 135–145)
Total Protein: 8.5 g/dL — ABNORMAL HIGH (ref 6.5–8.1)

## 2017-01-11 LAB — MRSA PCR SCREENING: MRSA by PCR: POSITIVE — AB

## 2017-01-11 LAB — MAGNESIUM: MAGNESIUM: 2.3 mg/dL (ref 1.7–2.4)

## 2017-01-11 LAB — TSH: TSH: 2.192 u[IU]/mL (ref 0.350–4.500)

## 2017-01-11 LAB — PHOSPHORUS: PHOSPHORUS: 4.2 mg/dL (ref 2.5–4.6)

## 2017-01-11 MED ORDER — CHLORHEXIDINE GLUCONATE CLOTH 2 % EX PADS
6.0000 | MEDICATED_PAD | Freq: Every day | CUTANEOUS | Status: DC
Start: 2017-01-12 — End: 2017-01-15
  Administered 2017-01-12 – 2017-01-15 (×4): 6 via TOPICAL

## 2017-01-11 MED ORDER — DEXTROSE 5 % IV SOLN
500.0000 mg | INTRAVENOUS | Status: DC
Start: 2017-01-11 — End: 2017-01-13
  Administered 2017-01-11 – 2017-01-12 (×2): 500 mg via INTRAVENOUS
  Filled 2017-01-11 (×2): qty 500

## 2017-01-11 MED ORDER — VALPROATE SODIUM 250 MG/5ML PO SOLN
650.0000 mg | Freq: Four times a day (QID) | ORAL | Status: DC
  Administered 2017-01-11 – 2017-01-15 (×16): 650 mg
  Filled 2017-01-11 (×22): qty 15

## 2017-01-11 MED ORDER — LEVETIRACETAM 100 MG/ML PO SOLN
1250.0000 mg | Freq: Two times a day (BID) | ORAL | Status: DC
  Administered 2017-01-11 – 2017-01-15 (×8): 1250 mg
  Filled 2017-01-11 (×8): qty 15

## 2017-01-11 MED ORDER — ENOXAPARIN SODIUM 40 MG/0.4ML ~~LOC~~ SOLN
40.0000 mg | SUBCUTANEOUS | Status: DC
  Administered 2017-01-11 – 2017-01-12 (×2): 40 mg via SUBCUTANEOUS
  Filled 2017-01-11 (×2): qty 0.4

## 2017-01-11 MED ORDER — LORATADINE 5 MG/5ML PO SYRP
10.0000 mg | ORAL_SOLUTION | Freq: Every day | ORAL | Status: DC
  Administered 2017-01-11 – 2017-01-15 (×5): 10 mg
  Filled 2017-01-11 (×6): qty 10

## 2017-01-11 MED ORDER — INFLUENZA VAC SPLIT QUAD 0.5 ML IM SUSY
0.5000 mL | PREFILLED_SYRINGE | INTRAMUSCULAR | Status: AC
  Administered 2017-01-12: 0.5 mL via INTRAMUSCULAR
  Filled 2017-01-11: qty 0.5

## 2017-01-11 MED ORDER — LEVOTHYROXINE SODIUM 88 MCG PO TABS
88.0000 ug | ORAL_TABLET | Freq: Every day | ORAL | Status: DC
  Administered 2017-01-12 – 2017-01-15 (×4): 88 ug
  Filled 2017-01-11 (×4): qty 1

## 2017-01-11 MED ORDER — METRONIDAZOLE 50 MG/ML ORAL SUSPENSION
500.0000 mg | Freq: Three times a day (TID) | ORAL | Status: DC
  Administered 2017-01-11 – 2017-01-15 (×12): 500 mg
  Filled 2017-01-11 (×15): qty 10

## 2017-01-11 MED ORDER — JEVITY 1.2 CAL PO LIQD: 574.0000 mL | ORAL | Status: DC

## 2017-01-11 MED ORDER — DEXTROSE 5 % IV SOLN
1.0000 g | INTRAVENOUS | Status: DC
  Administered 2017-01-11 – 2017-01-12 (×2): 1 g via INTRAVENOUS
  Filled 2017-01-11 (×2): qty 10

## 2017-01-11 MED ORDER — METRONIDAZOLE 500 MG PO TABS
500.0000 mg | ORAL_TABLET | Freq: Three times a day (TID) | ORAL | Status: DC
  Filled 2017-01-11: qty 1

## 2017-01-11 MED ORDER — MUPIROCIN 2 % EX OINT
1.0000 "application " | TOPICAL_OINTMENT | Freq: Two times a day (BID) | CUTANEOUS | Status: DC
  Administered 2017-01-11 – 2017-01-15 (×9): 1 via NASAL
  Filled 2017-01-11 (×3): qty 22

## 2017-01-11 MED ORDER — LACOSAMIDE 10 MG/ML PO SOLN
50.0000 mg | Freq: Two times a day (BID) | ORAL | Status: DC
  Administered 2017-01-11 – 2017-01-15 (×8): 50 mg
  Filled 2017-01-11 (×8): qty 6

## 2017-01-11 MED ORDER — JEVITY 1.2 CAL PO LIQD
574.0000 mL | ORAL | Status: DC
  Administered 2017-01-12: 574 mL
  Filled 2017-01-11 (×2): qty 1000

## 2017-01-11 MED ORDER — RANITIDINE HCL 150 MG/10ML PO SYRP
75.0000 mg | ORAL_SOLUTION | Freq: Two times a day (BID) | ORAL | Status: DC
  Administered 2017-01-11 – 2017-01-15 (×8): 75 mg
  Filled 2017-01-11 (×11): qty 10

## 2017-01-11 NOTE — H&P (Signed)
[facility] Admission History and Physical Service Pager: [phone]  Patient name: Megan [name] Medical record number: [id number] Date of birth: [birth year] Age: 36 y.o. Gender: female  Primary Care Provider: Nuala Alpha, DO Consultants: none Code Status: FULL  Chief Complaint: hypoxia with cough and congestion  Assessment and Plan: Megan [name] is a 36 y.o. female presenting with cough, congestion, hypoxia . 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   Cough, Nasal Congestion with Hypoxia: Possibly due to viral URI. Due to risks will evaluate for PNA/aspiration PNA. Vitals are stable and patient is afebrile. She is on 3L [place] with good saturations. In clinic found to have 85% on room air, improved to 100% on 3L [place].  - admit to medsurg, attending Dr. Ardelia Mems - CBC and CMP - CXR to evaluate for PNA - will obtain the above studies to decide if abx is needed  Seizure DO: Home meds: Keppra, Depakene, Vimpat. Last seizure 12/2013.  - continue home medications  Allergic Rhinitis: - continue Claritin  GER:  - continue Zantac  FEN/GI: Jevity 562ml per tube at 52ml/her daily Prophylaxis: Lovenox  Disposition: admit for further evaluation   History of Present Illness:  Megan [name] is a 36 y.o. female presenting with cough, nasal congestion and hypoxia.   Grandmother provided history. Reports of dry hacking cough for the past few days and it seemed to worsen last night. Patient was up through the night coughing. She also has some rhinorrhea and nasal congestion. No increased work of breath. Reports of tactile fever and her caregiver gave some tylenol at unknown time today. Has a history of PNA with last episode in 01/2016 per chart review. Grandmother reports her PNA starts  with similar symptoms she currently has. No vomiting or diarrhea. No sick contacts but she does go to day school. Reports that patient seems a little more sleepy but states that she was up through out the night with cough.   She was seen in clinic today and was found to have O2 sat of 85 % on room air. This improved with [place] 3L. Patient admitted for observation and further evaluation.   Review Of Systems: Per HPI with the following additions: unable to obtain ROS as patient has severe intellectual disability.   ROS  Patient Active Problem List   Diagnosis Date Noted  . Hypoxemia 01/11/2017  . Healthcare maintenance 07/08/2016  . Hypoxia   . Pneumonia 02/23/2016  . 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 Dysphagia 02/12/2014  . 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 Cough 01/26/2014  . Localization-related symptomatic epilepsy and epileptic syndromes with complex partial seizures, not intractable, without status epilepticus ([place]) 08/23/2013  . Unspecified constipation 04/18/2013  . Fibroid 04/18/2013  . Abnormal mental state 03/27/2013  . Seizure disorder ([place]) 03/27/2013  . Allergic rhinitis 11/04/2012  . 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 Encounter for long-term (current) use of other medications 10/19/2012  . Severe intellectual disabilities 10/19/2012  . 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 Feeding difficulties and mismanagement 02/19/2012  . Hypothyroidism 06/25/2010  . Hypertension 06/23/2010  . Infantile cerebral palsy ([place]) 02/21/2010  . Blindness of both eyes 02/21/2010    Past Medical History: Past Medical History:  Diagnosis Date  . Allergic rhinitis 11/04/2012  . Cerebral palsy ([place])   . Cerebral palsy, quadriplegic ([place])   .  CHF (congestive heart failure) ([place])   . COPD (chronic obstructive pulmonary disease) ([place])   . Cortical blindness   . Dilantin toxicity August 2014  . Hypertension 06/23/2010  . Mental  retardation   . Pneumonia December 2014   Was intubated, [facility]  . Seizures ([place])   . Vision abnormalities     Past Surgical History: Past Surgical History:  Procedure Laterality Date  . IR GENERIC HISTORICAL  06/30/2016   IR REPLC GASTRO/COLONIC TUBE PERCUT W/FLUORO 06/30/2016 Marybelle Killings, MD [facility]-INTERV RAD  . PEG PLACEMENT  09/03/2011   Procedure: PERCUTANEOUS ENDOSCOPIC GASTROSTOMY (PEG) REPLACEMENT;  Surgeon: Jerene Bears, MD;  Location: [facility] ENDOSCOPY;  Service: Gastroenterology;  Laterality: N/A;  . PEG PLACEMENT  02/19/2012   Procedure: PERCUTANEOUS ENDOSCOPIC GASTROSTOMY (PEG) REPLACEMENT;  Surgeon: Lafayette Dragon, MD;  Location: [facility];  Service: Endoscopy;  Laterality: N/A;  needs gastrostomy button 24 Fr 4.4 and 3.5 available  . PEG PLACEMENT N/A 05/30/2013   Procedure: PERCUTANEOUS ENDOSCOPIC GASTROSTOMY (PEG) REPLACEMENT;  Surgeon: Jerene Bears, MD;  Location: [facility];  Service: Gastroenterology;  Laterality: N/A;  . PEG PLACEMENT N/A 07/03/2013   Procedure: PERCUTANEOUS ENDOSCOPIC GASTROSTOMY (PEG) REPLACEMENT;  Surgeon: Lafayette Dragon, MD;  Location: [facility];  Service: Endoscopy;  Laterality: N/A;  Replace with 20 Fr x 3.5 cm mini button-  . PEG TUBE PLACEMENT      Social History: Social History  Substance Use Topics  . Smoking status: Never Smoker  . Smokeless tobacco: Never Used  . Alcohol use No   Additional social history:  Please also refer to relevant sections of EMR.  Family History: Family History  Problem Relation Age of Onset  . Diabetes Mother   . Diabetes Maternal Grandmother   . Stroke Maternal Grandmother   . Cancer Maternal Grandfather        Died at 60  . Diabetes Maternal Aunt   . Hypertension Other        Fhx    Allergies and Medications: Allergies  Allergen Reactions  . Doxycycline Rash and Other (See Comments)    REACTION: Blisters / swelling  . Other Other (See Comments)    Seasonal Allergies     No current  facility-administered medications on file prior to encounter.    Current Outpatient Prescriptions on File Prior to Encounter  Medication Sig Dispense Refill  . Dextromethorphan Polistirex (DELSYM PO) Place 12.5 mLs into feeding tube every 12 (twelve) hours as needed (cough).    . DIASTAT ACUDIAL 10 MG GEL Place 5 mg rectally once. 2 Package 5  . ibuprofen (ADVIL,MOTRIN) 100 MG/5ML suspension Place 240 mg into feeding tube every 8 (eight) hours as needed for fever. 12 ml     . Incontinence Supplies MISC 1 Units by Does not apply route as needed (For voids or bowel movements). 100 each prn  . levETIRAcetam (KEPPRA) 100 MG/ML solution GIVE (12.5 MLS) IN THE MORNING AND (12.5 MLS) IN THE EVENING BY PEG TUBE 850 mL 5  . levothyroxine (SYNTHROID, LEVOTHROID) 88 MCG tablet TAKE 1 TABLET (88 MCG TOTAL) PER TUBE DAILY. 30 tablet 11  . loratadine (CLARITIN) 5 MG/5ML syrup Place 10 mLs (10 mg total) into feeding tube daily. (Patient taking differently: Place 10 mg into feeding tube daily as needed (congestion). ) 300 mL 12  . Nutritional Supplements (FEEDING SUPPLEMENT, JEVITY 1.2 CAL,) LIQD Place 574 mLs into feeding tube See admin instructions. Give 2 cans (574 mls) per tube at 40 ml/hr daily    .  Nutritional Supplements (JEVITY) LIQD 1 Can by Per J Tube route 3 (three) times daily. 90 Can 11  . ranitidine (ZANTAC) 75 MG/5ML syrup TAKE 5 MLS (75 MG TOTAL) BY PER J TUBE ROUTE 2 (TWO) TIMES DAILY. 480 mL 10  . Valproate Sodium (DEPAKENE) 250 MG/5ML SOLN solution GIVE 13 ML BY PEG TUBE EVERY 6 HOURS 1600 mL 5  . Valproate Sodium (DEPAKENE) 250 MG/5ML SOLN solution GIVE 13 ML BY PEG TUBE EVERY 6 HOURS 1600 mL 2  . VIMPAT 10 MG/ML oral solution GIVE 5 MLS EVERY MORNING AND 5 MLS EVERY EVENING 310 mL 2    Objective: BP 117/73   Pulse (!) 117   Resp 20   SpO2 100%  Exam: General: NAD, resting in bed Eyes: sclerae clear ENTM: unable to visualize pharynx (difficult exam), moist mucous membranes.  Rhinorrhea/nasal crusting.  Neck: supple, no cervical lymphadenopathy Cardiovascular: RRR, normal S1 and S2, no m/r/g Respiratory: normal effort without signs of increased work of breathing. Diminished breath sounds at the bases likely effort dependent. No crackles or wheezing noted. Intermittent congested cough. Gastrointestinal: soft, nontender, nondistended. PEG tube without any surrounding erythema or discharge MSK: contractures from CP  Derm: normal capillary refill. No apparent skin lesions Neuro: known CP  Labs and Imaging: CBC BMET  No results for input(s): WBC, HGB, HCT, PLT in the last 168 hours. No results for input(s): NA, K, CL, CO2, BUN, CREATININE, GLUCOSE, CALCIUM in the last 168 hours.     Smiley Houseman, MD 01/11/2017, 6:44 PM PGY-3, [facility] pager: [phone], text pages welcome

## 2017-01-11 NOTE — Progress Notes (Addendum)
Pharmacy Antibiotic Note  Shantanique Hodo is a 36 y.o. female admitted on 01/11/2017 with cough, congestion, and hypoxemia.  Pharmacy has been consulted for Ceftriaxone dosing for CAP. Also starting IV Azithromycin and Metronidazole. Patient is afebrile.   Plan: Ceftriaxone 1g IV q24h    Temp (24hrs), Avg:98.5 F (36.9 C), Min:98.5 F (36.9 C), Max:98.5 F (36.9 C)   Recent Labs Lab 01/11/17 1844  WBC PENDING    CrCl cannot be calculated (Patient's most recent lab result is older than the maximum 21 days allowed.).    Allergies  Allergen Reactions  . Doxycycline Rash and Other (See Comments)    REACTION: Blisters / swelling  . Other Other (See Comments)    Seasonal Allergies      Antimicrobials this admission: Ceftriaxone 9/17>> Azithromycin 9/17>> Metronidazole 9/17>>  Dose adjustments this admission: n/a  Microbiology results: 9/17 MRSA PCR: sent   Thank you for allowing pharmacy to be a part of this patient's care. Nicole Cella, RPh Clinical Pharmacist Pager: [phone] 01/11/2017 8:06 PM

## 2017-01-11 NOTE — Progress Notes (Signed)
Megan [name] is a 36 y.o. female patient admitted from ED awake, alert - oriented  X 4 - no acute distress noted.  VSS - Blood pressure 117/73, pulse (!) 117, resp. rate 20, SpO2 100 %.    IV in place, occlusive dsg intact without redness.  Orientation to room, and floor completed with information packet given to patient/family.  Patient declined safety video at this time.  Admission INP armband ID verified with patient/family, and in place.   SR up x 2, fall assessment complete, with patient and family able to verbalize understanding of risk associated with falls, and verbalized understanding to call nsg before up out of bed.  Call light within reach, patient able to voice, and demonstrate understanding.  Skin, clean-dry- intact without evidence of bruising, or skin tears.   No evidence of skin break down noted on exam.     Will cont to eval and treat per MD orders.  Celine Ahr, RN 01/11/2017 7:05 PM

## 2017-01-11 NOTE — Progress Notes (Signed)
  Subjective:    Megan [name] is a 36 y.o. old female here cough and congestion  HPI Cough and congestion: these have been going on for 3-4 days. She also have runny nose. Subjective fever. Didn't check her temperture at home. Last fever was this morning and was given ibuprofen. Nasal drainage that looks yellow and greenish. Denies emesis or diarrhea. She is G-tube dependent for feeding. No history of asthma.  She has history of cerebral palsy and  Epilepsy. No recent seizure activity. Didn't have flu shot yet.   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   has a past medical history of Allergic rhinitis (11/04/2012); Cerebral palsy ([place]); Cerebral palsy, quadriplegic ([facility]); CHF (congestive heart failure) ([place]); COPD (chronic obstructive pulmonary disease) ([place]); Cortical blindness; Dilantin toxicity (August 2014); Hypertension (06/23/2010); Mental retardation; Pneumonia (December 2014); Seizures ([place]); and Vision abnormalities.  FH:  Family History  Problem Relation Age of Onset  . Diabetes Mother   . Diabetes Maternal Grandmother   . Stroke Maternal Grandmother   . Cancer Maternal Grandfather        Died at 3  . Diabetes Maternal Aunt   . Hypertension Other        Fhx    [place] Social History  Substance Use Topics  . Smoking  status: Never Smoker  . Smokeless tobacco: Never Used  . Alcohol use No    Review of Systems Review of systems negative except for pertinent positives and negatives in history of present illness above.     Objective:     Vitals:   01/11/17 1453  BP: 108/72  Temp: 98.5 F (36.9 C)  TempSrc: Oral  SpO2: (!) 89%  Weight: 110 lb (49.9 kg)   Body mass index is 22.22 kg/m.  Physical Exam GEN: no apparent distress, but looks tired and sleepier than her baseline per her grandmother Nares: significant for rhinorrhea, no nasal flare Oropharynx: difficult exam HEM: negative for cervical or periauricular lymphadenopathies CVS: RRR, nl S1&S2, no murmurs, no edema RESP: no IWOB, poor effort, rhonchi bilaterally, no crackles or wheeze. GI: BS present & normal, soft, NTND MSK: wheelchair-bound, some contractures from her cerbral palsy, no focal tenderness SKIN: no apparent skin lesion NEURO: known cerebral palsy    Assessment and Plan:  Hypoxemia in the setting of viral URI: oxygen saturation 89% on arrival and 85% on recheck. Started on oxygen by nasal cannula. No improvement on 2 L. Increased oxygen to 3 L and saturation was up to 100%. Patient with cerebral palsy. Exam consistent with viral URI. She has significant rhinorrhea and rhonchi bilaterally. Can't rule out aspiration pneumonia given history of cerebral palsy and epilepsy although she has no crackles on exam.  She also look tired and sleepy than her baseline. We'll admit for overnight observation due to hypoxemia. She also needs chest x-ray. Suggest covering her for aspirational pneumonia.   Discussed patient with Dr. Armstead Peaks, MD 01/11/17 Pager: [phone]

## 2017-01-12 DIAGNOSIS — H543 Unqualified visual loss, both eyes: Secondary | ICD-10-CM | POA: Diagnosis present

## 2017-01-12 DIAGNOSIS — J9601 Acute respiratory failure with hypoxia: Secondary | ICD-10-CM | POA: Diagnosis present

## 2017-01-12 DIAGNOSIS — G40909 Epilepsy, unspecified, not intractable, without status epilepticus: Secondary | ICD-10-CM | POA: Diagnosis not present

## 2017-01-12 DIAGNOSIS — G808 Other cerebral palsy: Secondary | ICD-10-CM | POA: Diagnosis present

## 2017-01-12 DIAGNOSIS — R0902 Hypoxemia: Secondary | ICD-10-CM | POA: Diagnosis not present

## 2017-01-12 DIAGNOSIS — R05 Cough: Secondary | ICD-10-CM | POA: Diagnosis not present

## 2017-01-12 DIAGNOSIS — F72 Severe intellectual disabilities: Secondary | ICD-10-CM | POA: Diagnosis present

## 2017-01-12 DIAGNOSIS — D539 Nutritional anemia, unspecified: Secondary | ICD-10-CM | POA: Diagnosis present

## 2017-01-12 DIAGNOSIS — R1310 Dysphagia, unspecified: Secondary | ICD-10-CM | POA: Diagnosis present

## 2017-01-12 DIAGNOSIS — Z931 Gastrostomy status: Secondary | ICD-10-CM | POA: Diagnosis not present

## 2017-01-12 DIAGNOSIS — J449 Chronic obstructive pulmonary disease, unspecified: Secondary | ICD-10-CM | POA: Diagnosis present

## 2017-01-12 DIAGNOSIS — N179 Acute kidney failure, unspecified: Secondary | ICD-10-CM | POA: Diagnosis present

## 2017-01-12 DIAGNOSIS — Z8701 Personal history of pneumonia (recurrent): Secondary | ICD-10-CM | POA: Diagnosis not present

## 2017-01-12 DIAGNOSIS — E860 Dehydration: Secondary | ICD-10-CM | POA: Diagnosis present

## 2017-01-12 DIAGNOSIS — D7589 Other specified diseases of blood and blood-forming organs: Secondary | ICD-10-CM | POA: Diagnosis present

## 2017-01-12 DIAGNOSIS — Z881 Allergy status to other antibiotic agents status: Secondary | ICD-10-CM | POA: Diagnosis not present

## 2017-01-12 DIAGNOSIS — Z8249 Family history of ischemic heart disease and other diseases of the circulatory system: Secondary | ICD-10-CM | POA: Diagnosis not present

## 2017-01-12 DIAGNOSIS — E039 Hypothyroidism, unspecified: Secondary | ICD-10-CM | POA: Diagnosis present

## 2017-01-12 DIAGNOSIS — Z22322 Carrier or suspected carrier of Methicillin resistant Staphylococcus aureus: Secondary | ICD-10-CM | POA: Diagnosis not present

## 2017-01-12 DIAGNOSIS — J309 Allergic rhinitis, unspecified: Secondary | ICD-10-CM | POA: Diagnosis present

## 2017-01-12 DIAGNOSIS — J690 Pneumonitis due to inhalation of food and vomit: Secondary | ICD-10-CM | POA: Diagnosis present

## 2017-01-12 DIAGNOSIS — K219 Gastro-esophageal reflux disease without esophagitis: Secondary | ICD-10-CM | POA: Diagnosis present

## 2017-01-12 DIAGNOSIS — G800 Spastic quadriplegic cerebral palsy: Secondary | ICD-10-CM | POA: Diagnosis not present

## 2017-01-12 DIAGNOSIS — G40209 Localization-related (focal) (partial) symptomatic epilepsy and epileptic syndromes with complex partial seizures, not intractable, without status epilepticus: Secondary | ICD-10-CM | POA: Diagnosis present

## 2017-01-12 DIAGNOSIS — G8220 Paraplegia, unspecified: Secondary | ICD-10-CM | POA: Diagnosis present

## 2017-01-12 LAB — BASIC METABOLIC PANEL
Anion gap: 7 (ref 5–15)
BUN: 13 mg/dL (ref 6–20)
CHLORIDE: 103 mmol/L (ref 101–111)
CO2: 29 mmol/L (ref 22–32)
CREATININE: 1.36 mg/dL — AB (ref 0.44–1.00)
Calcium: 8.8 mg/dL — ABNORMAL LOW (ref 8.9–10.3)
GFR calc Af Amer: 57 mL/min — ABNORMAL LOW (ref >60)
GFR calc non Af Amer: 49 mL/min — ABNORMAL LOW (ref >60)
GLUCOSE: 109 mg/dL — AB (ref 65–99)
POTASSIUM: 4.2 mmol/L (ref 3.5–5.1)
Sodium: 139 mmol/L (ref 135–145)

## 2017-01-12 LAB — CBC
HCT: 29.7 % — ABNORMAL LOW (ref 36.0–46.0)
HEMOGLOBIN: 8.7 g/dL — AB (ref 12.0–15.0)
MCH: 32 pg (ref 26.0–34.0)
MCHC: 29.3 g/dL — AB (ref 30.0–36.0)
MCV: 109.2 fL — AB (ref 78.0–100.0)
Platelets: 227 10*3/uL (ref 150–400)
RBC: 2.72 MIL/uL — AB (ref 3.87–5.11)
RDW: 17.8 % — ABNORMAL HIGH (ref 11.5–15.5)
WBC: 10.8 10*3/uL — ABNORMAL HIGH (ref 4.0–10.5)

## 2017-01-12 LAB — PATHOLOGIST SMEAR REVIEW

## 2017-01-12 LAB — HIV ANTIBODY (ROUTINE TESTING W REFLEX): HIV Screen 4th Generation wRfx: NONREACTIVE

## 2017-01-12 MED ORDER — SODIUM CHLORIDE 0.9 % IV BOLUS (SEPSIS)
1000.0000 mL | Freq: Once | INTRAVENOUS | Status: AC
Start: 2017-01-12 — End: 2017-01-12
  Administered 2017-01-12: 1000 mL via INTRAVENOUS

## 2017-01-12 MED ORDER — JEVITY 1.2 CAL PO LIQD
574.0000 mL | ORAL | Status: DC
  Administered 2017-01-13 – 2017-01-15 (×3): 574 mL
  Filled 2017-01-12 (×5): qty 711

## 2017-01-12 MED ORDER — ACETAMINOPHEN 325 MG PO TABS
650.0000 mg | ORAL_TABLET | Freq: Four times a day (QID) | ORAL | Status: DC | PRN
  Administered 2017-01-12: 650 mg
  Filled 2017-01-12: qty 2

## 2017-01-12 MED ORDER — SODIUM CHLORIDE 0.9 % IV BOLUS (SEPSIS)
500.0000 mL | Freq: Once | INTRAVENOUS | Status: AC
  Administered 2017-01-12: 500 mL via INTRAVENOUS

## 2017-01-12 NOTE — Progress Notes (Signed)
Family Medicine Teaching Service Daily Progress Note Intern Pager: [phone]  Patient name: Francile Woolford Medical record number: [id number] Date of birth: Sep 23, 1980 Age: 36 y.o. Gender: female  Primary Care Provider: Nuala Alpha, DO Consultants: none Code Status: FULL  Pt Overview and Major Events to Date:  9/18 - CXR  Assessment and Plan:  Adaly Puder is a 36 y.o. female presenting with cough, congestion, hypoxia . 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   LLL PNA 2/2 Aspiration: Possibly due to CAP vs Aspiration vs Viral URI (unlikely). Patient did spike fever while here and has been tachycardic on 3L of O2. She is on 3L [place] with good saturations. In clinic found to have 85% on room air, improved to 100% on 3L [place]. WBC high at 14, CXR showed LLL opacity. - Started on Azithromycin, and Ceftriaxone and Metronidazole to cover CAP and aspirational PNA. - Follow CBC - Follow up on Strep urine antigen - Tylenol as needed for fever and pain  AKI: Patient has Cr of 1.48 on admission. Baseline has been in the 0.76-0.85 range. Very likely due to dehydration since patient has been missing feeds. - Received 1.5L boluses so far - Continue to monitor Cr - Restarted Feeds to improve overall fluid status  Hypothyroidism: stable - Continue Levothyroxine at home dose at 73mcg per tube before breakfast.  Seizure DO: Home meds: Keppra, Depakene, Vimpat. Last seizure 12/2013.  - Continue home medications  Allergic Rhinitis: - Continue Claritin  GER:  - Continue Zantac  FEN/GI: g-tube dependent; Jevity 564ml per tube at 76ml/hr daily PPx: lovenox  Disposition: stable, keep inpatient  Subjective:  Nilaya Bouie is a 36y/o female here for suspected CAP, This morning I spoke with her cousin Jeralene Huff who is a  listed care giver. I also spoke with the mother over the phone while I was in the room with the cousin. The patient is unable to speak. Per mom, she has been overall feeling sick and been very agitated with difficulty breathing for several days and not sleeping well. She has had a cough for several weeks and it was not getting better.   Her activity is decreased from normal and per family she gets "sleepy like this" when she is sick and/or not feeling well. Patient's cousin had concerns about her care and frustrations about communication with her about the condition of Mrs. Crumrine. I communicated that we would all work together to provide excellent care for her and would make an effort to improve communication going forward while she is in the hospital.   Objective: Temp:  [96.9 F (36.1 C)-101.3 F (38.5 C)] 101.2 F (38.4 C) (09/18 0935) Pulse Rate:  [114-130] 130 (09/18 0935) Resp:  [16-20] 20 (09/18 0935) BP: (98-117)/(62-75) 106/75 (09/18 0935) SpO2:  [89 %-100 %] 100 % (09/18 0935) Weight:  [110 lb (49.9 kg)] 110 lb (49.9 kg) (09/17 1453) Physical Exam: General: Solemn but arousable, NAD; patient is paraplegic at baseline Cardiovascular: Tachycardia, regular rhythm, normal S1, S2, no murmurs, +2 radial pulses bilaterally Respiratory: frontal lung fields CTAB, no wheezing, no crackles, no rhonchi Abdomen: soft, non-distended, non-tender, +bs in all four quadrants Extremities:   Laboratory:  Recent Labs Lab 01/11/17 1844 01/12/17 0743  WBC 14.5* 10.8*  HGB 9.2* 8.7*  HCT 29.8* 29.7*  PLT 207 227    Recent Labs Lab 01/11/17 1844 01/12/17  0743  NA 137 139  K 5.6* 4.2  CL 102 103  CO2 25 29  BUN 21* 13  CREATININE 1.48* 1.36*  CALCIUM 8.9 8.8*  PROT 8.5*  --   BILITOT 1.4*  --   ALKPHOS 100  --   ALT 15  --   AST 61*  --   GLUCOSE 148* 109*   MRSA screen - positive TSH - 2.192 Phos - 4.2 Mag - 2.3 Strep Pneumo Urinary antigen - pending  Imaging/Diagnostic  Tests:  IMPRESSION: Dense rounded opacity at the left lung base, suspected pneumonia. Recommend follow-up chest x-ray to ensure resolution and thereby exclude the alternative possibility of neoplastic process.  Nuala Alpha, DO 01/12/2017, 1:40 PM PGY-1, [place] Intern pager: [phone], text pages welcome

## 2017-01-13 LAB — BASIC METABOLIC PANEL
ANION GAP: 8 (ref 5–15)
BUN: 7 mg/dL (ref 6–20)
CALCIUM: 8.6 mg/dL — AB (ref 8.9–10.3)
CO2: 27 mmol/L (ref 22–32)
CREATININE: 1.15 mg/dL — AB (ref 0.44–1.00)
Chloride: 104 mmol/L (ref 101–111)
GFR, EST NON AFRICAN AMERICAN: 60 mL/min — AB (ref >60)
Glucose, Bld: 232 mg/dL — ABNORMAL HIGH (ref 65–99)
Potassium: 3.8 mmol/L (ref 3.5–5.1)
SODIUM: 139 mmol/L (ref 135–145)

## 2017-01-13 LAB — BLOOD CULTURE ID PANEL (REFLEXED)
ACINETOBACTER BAUMANNII: NOT DETECTED
CANDIDA KRUSEI: NOT DETECTED
CANDIDA PARAPSILOSIS: NOT DETECTED
CARBAPENEM RESISTANCE: NOT DETECTED
Candida albicans: NOT DETECTED
Candida glabrata: NOT DETECTED
Candida tropicalis: NOT DETECTED
ENTEROBACTERIACEAE SPECIES: NOT DETECTED
ENTEROCOCCUS SPECIES: NOT DETECTED
Enterobacter cloacae complex: NOT DETECTED
Escherichia coli: NOT DETECTED
Haemophilus influenzae: NOT DETECTED
KLEBSIELLA OXYTOCA: NOT DETECTED
KLEBSIELLA PNEUMONIAE: NOT DETECTED
LISTERIA MONOCYTOGENES: NOT DETECTED
Methicillin resistance: NOT DETECTED
Neisseria meningitidis: NOT DETECTED
PSEUDOMONAS AERUGINOSA: NOT DETECTED
Proteus species: NOT DETECTED
SERRATIA MARCESCENS: NOT DETECTED
STAPHYLOCOCCUS AUREUS BCID: NOT DETECTED
STAPHYLOCOCCUS SPECIES: NOT DETECTED
STREPTOCOCCUS AGALACTIAE: NOT DETECTED
Streptococcus pneumoniae: NOT DETECTED
Streptococcus pyogenes: NOT DETECTED
Streptococcus species: NOT DETECTED
VANCOMYCIN RESISTANCE: NOT DETECTED

## 2017-01-13 LAB — CBC WITH DIFFERENTIAL/PLATELET
BASOS ABS: 0.1 10*3/uL (ref 0.0–0.1)
Basophils Relative: 1 %
EOS ABS: 0.1 10*3/uL (ref 0.0–0.7)
Eosinophils Relative: 1 %
HCT: 25.3 % — ABNORMAL LOW (ref 36.0–46.0)
Hemoglobin: 7.9 g/dL — ABNORMAL LOW (ref 12.0–15.0)
LYMPHS PCT: 26 %
Lymphs Abs: 3.8 10*3/uL (ref 0.7–4.0)
MCH: 33.9 pg (ref 26.0–34.0)
MCHC: 31.2 g/dL (ref 30.0–36.0)
MCV: 108.6 fL — ABNORMAL HIGH (ref 78.0–100.0)
MONO ABS: 2.4 10*3/uL — AB (ref 0.1–1.0)
Monocytes Relative: 16 %
NEUTROS PCT: 56 %
Neutro Abs: 8.3 10*3/uL — ABNORMAL HIGH (ref 1.7–7.7)
PLATELETS: 207 10*3/uL (ref 150–400)
RBC: 2.33 MIL/uL — ABNORMAL LOW (ref 3.87–5.11)
RDW: 18.4 % — AB (ref 11.5–15.5)
WBC: 14.7 10*3/uL — AB (ref 4.0–10.5)

## 2017-01-13 LAB — GLUCOSE, CAPILLARY
GLUCOSE-CAPILLARY: 181 mg/dL — AB (ref 65–99)
Glucose-Capillary: 201 mg/dL — ABNORMAL HIGH (ref 65–99)

## 2017-01-13 LAB — OCCULT BLOOD X 1 CARD TO LAB, STOOL: Fecal Occult Bld: NEGATIVE

## 2017-01-13 MED ORDER — SODIUM CHLORIDE 0.9 % IV BOLUS (SEPSIS)
1000.0000 mL | Freq: Once | INTRAVENOUS | Status: AC
  Administered 2017-01-13: 1000 mL via INTRAVENOUS

## 2017-01-13 MED ORDER — AZITHROMYCIN 200 MG/5ML PO SUSR
500.0000 mg | Freq: Every day | ORAL | Status: DC
  Administered 2017-01-14 (×2): 500 mg via ORAL
  Filled 2017-01-13 (×6): qty 15

## 2017-01-13 MED ORDER — JEVITY 1.2 CAL PO LIQD: 1000.0000 mL | ORAL | Status: DC

## 2017-01-13 MED ORDER — ENOXAPARIN SODIUM 30 MG/0.3ML ~~LOC~~ SOLN
30.0000 mg | SUBCUTANEOUS | Status: DC
  Administered 2017-01-13 – 2017-01-14 (×2): 30 mg via SUBCUTANEOUS
  Filled 2017-01-13 (×2): qty 0.3

## 2017-01-13 NOTE — Progress Notes (Signed)
Family Medicine Teaching Service Daily Progress Note Intern Pager: [phone]  Patient name: Megan [name] Medical record number: [id number] Date of birth: [birth year] Age: 36 y.o. Gender: female  Primary Care Provider: Nuala Alpha, DO Consultants: none Code Status: FULL  Pt Overview and Major Events to Date:  9/18 - CXR  Assessment and Plan:  Megan [name] is a 36 y.o. female presenting with cough, congestion, hypoxia . 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   LLL PNA 2/2 Aspiration: Possibly due to CAP vs Aspiration vs Viral URI (unlikely). Patient did spike fever while here and has been tachycardic on 3L of O2. She is on 3L [place] with good saturations. In clinic found to have 85% on room air, improved to 100% on 3L [place]. WBC high at 14, CXR showed LLL opacity. - Continue Azithromycin and Metronidazole and discontinued Ceftriaxone  to cover CAP and aspirational PNA. Treatment needed for total of 7 days. Abx start date was 9/17 - Follow CBC -> WBC up trending - Strep urine antigen pending - BCx neg <24 - Tylenol as needed for fever and pain - Wean off O2 as she does not need Oxygen at home, now on 1L of O2 with sats at 100%  AKI: Improving, Cr 1.15 this am. Patient has Cr of 1.48 on admission. Baseline has been in the 0.76-0.85 range. Very likely due to dehydration since patient has been missing feeds. - Required no further boluses or fluid - Continue to monitor Cr - Continue feeds to improve overall fluid status, consider supplemental fluids if suspicion of extra losses becomes more likely  Macrocytic Anemia: Chronic and stable but watching due to down trending H/H with macrocytosis. Unclear etiology but should be noted she has had this for quite some time. - Continue to follow CBC - B12, Folate levels  wnl, TSH wnl  Hypothyroidism: stable - Continue Levothyroxine at home dose at 29mcg per tube before breakfast.  Seizure DO: Home meds: Keppra, Depakene, Vimpat. Last seizure 12/2013.  - Continue home medications  Allergic Rhinitis: - Continue Claritin  GER:  - Continue Zantac  FEN/GI: g-tube dependent; Jevity 578ml per tube at 66ml/hr daily PPx: lovenox  Disposition: stable, keep inpatient due to tachycardia and oxygen requirement  Subjective:  Megan [name] is a 36y/o female here for suspected CAP, This morning no caregiver was in the room. Megan [name] is unable to speak but does awaken when prompted. She did not appear to be in any distress.  Megan [name] continues to have recurrent aspiration issues which cannot be prevented.  Objective: Temp:  [97.9 F (36.6 C)-98.9 F (37.2 C)] 98.9 F (37.2 C) (09/19 0540) Pulse Rate:  [109-126] 126 (09/19 0540) Resp:  [18-19] 19 (09/19 0540) BP: (99-101)/(61-76) 101/63 (09/19 0540) SpO2:  [98 %-100 %] 100 % (09/19 0540) Weight:  [102 lb 1.2 oz (46.3 kg)] 102 lb 1.2 oz (46.3 kg) (09/19 0540) Physical Exam: General: Solemn but arousable, NAD; patient is paraplegic at baseline Cardiovascular: Tachycardia, regular rhythm, normal S1, S2, no murmurs, +2 radial pulses bilaterally Respiratory: frontal lung fields CTAB, no wheezing, no crackles, no rhonchi Abdomen: soft, non-distended, non-tender, +bs in all four quadrants Extremities: no cyanosis, edema Skin: warm, dry, intact, no rashes  Laboratory:  Recent Labs Lab 01/11/17 1844 01/12/17 0743 01/13/17 0912  WBC 14.5* 10.8* 14.7*  HGB 9.2* 8.7* 7.9*  HCT 29.8* 29.7*  25.3*  PLT 207 227 207    Recent Labs Lab 01/11/17 1844 01/12/17 0743 01/13/17 0414  NA 137 139 139  K 5.6* 4.2 3.8  CL 102 103 104  CO2 25 29 27   BUN 21* 13 7  CREATININE 1.48* 1.36* 1.15*  CALCIUM 8.9 8.8* 8.6*  PROT 8.5*  --   --   BILITOT 1.4*  --   --   ALKPHOS 100  --   --   ALT 15  --   --   AST 61*  --    --   GLUCOSE 148* 109* 232*   MRSA screen - positive TSH - 2.192 Phos - 4.2 Mag - 2.3 Strep Pneumo Urinary antigen - pending BCx - pending  Imaging/Diagnostic Tests:  IMPRESSION: Dense rounded opacity at the left lung base, suspected pneumonia. Recommend follow-up chest x-ray to ensure resolution and thereby exclude the alternative possibility of neoplastic process.  Nuala Alpha, DO 01/13/2017, 11:57 AM PGY-1, [place] Intern pager: [phone], text pages welcome

## 2017-01-13 NOTE — Progress Notes (Signed)
PHARMACY - PHYSICIAN COMMUNICATION CRITICAL VALUE ALERT - BLOOD CULTURE IDENTIFICATION (BCID)  Results for orders placed or performed during the hospital encounter of 01/11/17  Blood Culture ID Panel (Reflexed) (Collected: 01/12/2017  7:38 AM)  Result Value Ref Range   Enterococcus species NOT DETECTED NOT DETECTED   Vancomycin resistance NOT DETECTED NOT DETECTED   Listeria monocytogenes NOT DETECTED NOT DETECTED   Staphylococcus species NOT DETECTED NOT DETECTED   Staphylococcus aureus NOT DETECTED NOT DETECTED   Methicillin resistance NOT DETECTED NOT DETECTED   Streptococcus species NOT DETECTED NOT DETECTED   Streptococcus agalactiae NOT DETECTED NOT DETECTED   Streptococcus pneumoniae NOT DETECTED NOT DETECTED   Streptococcus pyogenes NOT DETECTED NOT DETECTED   Acinetobacter baumannii NOT DETECTED NOT DETECTED   Enterobacteriaceae species NOT DETECTED NOT DETECTED   Enterobacter cloacae complex NOT DETECTED NOT DETECTED   Escherichia coli NOT DETECTED NOT DETECTED   Klebsiella oxytoca NOT DETECTED NOT DETECTED   Klebsiella pneumoniae NOT DETECTED NOT DETECTED   Proteus species NOT DETECTED NOT DETECTED   Serratia marcescens NOT DETECTED NOT DETECTED   Carbapenem resistance NOT DETECTED NOT DETECTED   Haemophilus influenzae NOT DETECTED NOT DETECTED   Neisseria meningitidis NOT DETECTED NOT DETECTED   Pseudomonas aeruginosa NOT DETECTED NOT DETECTED   Candida albicans NOT DETECTED NOT DETECTED   Candida glabrata NOT DETECTED NOT DETECTED   Candida krusei NOT DETECTED NOT DETECTED   Candida parapsilosis NOT DETECTED NOT DETECTED   Candida tropicalis NOT DETECTED NOT DETECTED    Name of physician (or Provider) Contacted: Family medicine  Changes to prescribed antibiotics required: None, likely contaminant  Tad Moore 01/13/2017  12:46 PM

## 2017-01-13 NOTE — Progress Notes (Addendum)
Initial Nutrition Assessment  DOCUMENTATION CODES:   Not applicable  INTERVENTION:    Continue Jevity 1.2 formula at goal rate of 40 ml/hr via PEG tube  TF regimen providing 1152 kcals, 53 gm protein, 775 ml of free water daily  NUTRITION DIAGNOSIS:   Inadequate oral intake related to inability to eat as evidenced by  (NPO status, PEG tube dependency )  GOAL:   Patient will meet greater than or equal to 90% of their needs  MONITOR:   TF tolerance, Labs, Weight trends, Skin, I & O's  REASON FOR ASSESSMENT:   Malnutrition Screening Tool, Other (Comment) (New Tube Feeding)  ASSESSMENT:   36 yo F with history of CP/MR, seizure disorder, feeding tube dependent, presenting with congestion, tactile fevers, and found to be hypoxic on vital signs obtained in clinic.  Pt somnolent but arousable upon visit. Does not speak at baseline. Spoke with Jeralene Huff, pt's caretaker and cousin over telephone. States pt takes nothing by mouth.  Home TF regimen is unclear to RD.  Caretaker reports pt receives Jevity 1.2 formula: 2 cans per day via PEG. Continuous pump infuses TF at 40 ml/hr. States "at the bottom it says 574 ml".  Jeralene Huff also reports pt's wt has been stable. Labs & medications reviewed.  CBG's [phone].  Nutrition-Focused physical exam N/A given muscle loss history.  Verbal with Read Back order received per Rory Percy, DO for the Adult Tube Feeding Protocol.  Diet Order:  Diet NPO time specified  Skin:  Reviewed, no issues  Last BM:  9/16  Height:   Ht Readings from Last 1 Encounters:  01/13/17 5' (1.524 m)   Weight:   Wt Readings from Last 1 Encounters:  01/13/17 102 lb (46.3 kg)   Ideal Body Weight:  45.4 kg  BMI:  Body mass index is 19.92 kg/m.  Estimated Nutritional Needs:   Kcal:  1100-1300  Protein:  50-65 gm  Fluid:  >/= 1.5 L  EDUCATION NEEDS:   No education needs identified at this time  Arthur Holms, RD, LDN Pager #:  [phone] After-Hours Pager #: [phone]

## 2017-01-14 LAB — GLUCOSE, CAPILLARY
GLUCOSE-CAPILLARY: 245 mg/dL — AB (ref 65–99)
GLUCOSE-CAPILLARY: 265 mg/dL — AB (ref 65–99)
GLUCOSE-CAPILLARY: 284 mg/dL — AB (ref 65–99)
Glucose-Capillary: 228 mg/dL — ABNORMAL HIGH (ref 65–99)
Glucose-Capillary: 241 mg/dL — ABNORMAL HIGH (ref 65–99)
Glucose-Capillary: 287 mg/dL — ABNORMAL HIGH (ref 65–99)

## 2017-01-14 LAB — VITAMIN B12: Vitamin B-12: 1814 pg/mL — ABNORMAL HIGH (ref 180–914)

## 2017-01-14 LAB — BASIC METABOLIC PANEL
Anion gap: 11 (ref 5–15)
BUN: 8 mg/dL (ref 6–20)
CHLORIDE: 103 mmol/L (ref 101–111)
CO2: 25 mmol/L (ref 22–32)
CREATININE: 0.89 mg/dL (ref 0.44–1.00)
Calcium: 8.7 mg/dL — ABNORMAL LOW (ref 8.9–10.3)
GFR calc Af Amer: >60 mL/min (ref >60)
GFR calc non Af Amer: >60 mL/min (ref >60)
GLUCOSE: 261 mg/dL — AB (ref 65–99)
Potassium: 4.4 mmol/L (ref 3.5–5.1)
Sodium: 139 mmol/L (ref 135–145)

## 2017-01-14 LAB — CBC
HCT: 31.7 % — ABNORMAL LOW (ref 36.0–46.0)
HEMOGLOBIN: 9.8 g/dL — AB (ref 12.0–15.0)
MCH: 33.3 pg (ref 26.0–34.0)
MCHC: 30.9 g/dL (ref 30.0–36.0)
MCV: 107.8 fL — AB (ref 78.0–100.0)
Platelets: DECREASED 10*3/uL (ref 150–400)
RBC: 2.94 MIL/uL — AB (ref 3.87–5.11)
RDW: 18.2 % — ABNORMAL HIGH (ref 11.5–15.5)
WBC: 7.4 10*3/uL (ref 4.0–10.5)

## 2017-01-14 LAB — IRON AND TIBC
Iron: 69 ug/dL (ref 28–170)
SATURATION RATIOS: 23 % (ref 10.4–31.8)
TIBC: 305 ug/dL (ref 250–450)
UIBC: 236 ug/dL

## 2017-01-14 LAB — RETICULOCYTES
RBC.: 2.94 MIL/uL — AB (ref 3.87–5.11)
RETIC COUNT ABSOLUTE: 73.5 10*3/uL (ref 19.0–186.0)
Retic Ct Pct: 2.5 % (ref 0.4–3.1)

## 2017-01-14 LAB — FERRITIN: FERRITIN: 313 ng/mL — AB (ref 11–307)

## 2017-01-14 LAB — FOLATE: Folate: 26 ng/mL (ref >5.9)

## 2017-01-14 MED ORDER — SODIUM CHLORIDE 0.9 % IV BOLUS (SEPSIS)
500.0000 mL | Freq: Once | INTRAVENOUS | Status: AC
  Administered 2017-01-14: 500 mL via INTRAVENOUS

## 2017-01-14 NOTE — Progress Notes (Signed)
Inpatient Diabetes Program Recommendations  AACE/ADA: New Consensus Statement on Inpatient Glycemic Control (2015)  Target Ranges:  Prepandial:   less than 140 mg/dL      Peak postprandial:   less than 180 mg/dL (1-2 hours)      Critically ill patients:  140 - 180 mg/dL   Lab Results  Component Value Date   GLUCAP 265 (H) 01/14/2017   HGBA1C 5.5 01/01/2016    Review of Glycemic ControlResults for PENDA, VENTURI (MRN [id number]) as of 01/14/2017 12:50  Ref. Range 01/13/2017 20:30 01/14/2017 00:28 01/14/2017 04:48 01/14/2017 07:14 01/14/2017 12:11  Glucose-Capillary Latest Ref Range: 65 - 99 mg/dL 201 (H) 228 (H) 241 (H) 245 (H) 265 (H)    Diabetes history: None Outpatient Diabetes medications: None Current orders for Inpatient glycemic control: None  Inpatient Diabetes Program Recommendations:    If appropriate consider adding Very sensitive Novolog correction starting at 151 mg/dL using custom scale.  151-200 mg/dL- 1 unit, 201-250 mg/dL- 2 units, 251-300 mg/dL-3 units, 301-350 mg/dL-4 units, 351-400 mg/dL- 5 units-  Discussed with resident.  He will recheck A1C.  Thanks, Adah Perl, RN, BC-ADM Inpatient Diabetes Coordinator Pager [phone] (8a-5p)

## 2017-01-14 NOTE — Progress Notes (Signed)
Family Medicine Teaching Service Daily Progress Note Intern Pager: [phone]  Patient name: Megan [name] Medical record number: [id number] Date of birth: [birth year] Age: 36 y.o. Gender: female  Primary Care Provider: Nuala Alpha, DO Consultants: none Code Status: FULL  Pt Overview and Major Events to Date:  9/18 - CXR  Assessment and Plan:  Megan [name] is a 36 y.o. female presenting with cough, congestion, hypoxia . 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   LLL PNA 2/2 Aspiration: Possibly due to CAP vs Aspiration vs Viral URI (unlikely). Patient did remain afebrile overnight but has remained tachycardic on 1L of O2. She is maintained good saturations. CXR showed LLL opacity. - Continue Azithromycin and Metronidazole and discontinued Ceftriaxone  to cover CAP and aspirational PNA. Treatment needed for total of 7 days. Abx start date was 9/17; day 4 of abx. - Follow CBC -> WBC wnl this am - Strep urine antigen pending - BCx with growth for gram + cocci in clusters; Pharm reported likely containment as ID Panel did not detec any Staph species or any other bacteria.  - Tylenol as needed for fever and pain - Wean off O2 as she does not need Oxygen at home, now on 1L of O2 with sats at 100% - Consider another IVF bolus of NS if BP and tachycardia continue  AKI: Resolved, Cr 0.89 this am. Patient had Cr of 1.48 on admission. Baseline has been in the 0.76-0.85 range. Very likely due to dehydration since patient has been missing feeds and has improved with IVF boluses. - Continue to monitor Cr - Continue feeds to improve overall fluid status, consider fluid bolus if needed.  Macrocytic Anemia: Chronic and stable but watching due to down trending H/H with macrocytosis. Unclear etiology but should be noted  she has had this for quite some time. - Continue to follow CBC; H/H up trending and returned to her baseline this am. - Anemia panel pending - TSH wnl  Hypothyroidism: stable - Continue Levothyroxine at home dose at 5mcg per tube before breakfast.  Seizure DO: Home meds: Keppra, Depakene, Vimpat. Last seizure 12/2013.  - Continue home medications  Allergic Rhinitis: - Continue Claritin  GER:  - Continue Zantac  FEN/GI: g-tube dependent; Jevity 547ml per tube at 4ml/hr daily PPx: lovenox  Disposition: stable, keep inpatient overnight to monitor stats and heart rate off of O2  Subjective:  Megan [name] is a 36y/o female here for suspected CAP, This morning no caregiver was in the room. Megan [name] is unable to speak but does awaken when prompted. She was more awake and alert this morning than the previous days. She did not appear to be in any distress.  Megan [name] continues to have recurrent aspiration issues which cannot be prevented.  Objective: Temp:  [98.3 F (36.8 C)-98.8 F (37.1 C)] 98.6 F (37 C) (09/20 0450) Pulse Rate:  [105-110] 105 (09/20 0450) Resp:  [17-18] 18 (09/20 0450) BP: (90-94)/(54-67) 94/54 (09/20 0450) SpO2:  [96 %-100 %] 100 % (09/20 0450) Weight:  [102 lb (46.3 kg)-103 lb 2.8 oz (46.8 kg)] 103 lb 2.8 oz (46.8 kg) (09/20 0450) Physical Exam: General: Solemn but arousable, NAD; patient is paraplegic at baseline Cardiovascular: Tachycardia, regular rhythm, normal S1, S2, no murmurs, +2 radial pulses bilaterally Respiratory: frontal lung fields CTAB, no wheezing, no crackles, no rhonchi Abdomen: soft, non-distended, non-tender, +bs  in all four quadrants Extremities: no cyanosis, edema Skin: warm, dry, intact, no rashes  Laboratory:  Recent Labs Lab 01/12/17 0743 01/13/17 0912 01/14/17 0503  WBC 10.8* 14.7* 7.4  HGB 8.7* 7.9* 9.8*  HCT 29.7* 25.3* 31.7*  PLT 227 207 PLATELET CLUMPS NOTED ON SMEAR, COUNT APPEARS DECREASED    Recent Labs Lab  01/11/17 1844 01/12/17 0743 01/13/17 0414 01/14/17 0503  NA 137 139 139 139  K 5.6* 4.2 3.8 4.4  CL 102 103 104 103  CO2 25 29 27 25   BUN 21* 13 7 8   CREATININE 1.48* 1.36* 1.15* 0.89  CALCIUM 8.9 8.8* 8.6* 8.7*  PROT 8.5*  --   --   --   BILITOT 1.4*  --   --   --   ALKPHOS 100  --   --   --   ALT 15  --   --   --   AST 61*  --   --   --   GLUCOSE 148* 109* 232* 261*   MRSA screen - positive TSH - 2.192 Phos - 4.2 Mag - 2.3 Strep Pneumo Urinary antigen - pending BCx - gram + cocci in clusters; likely contaminated 9/20 - Anemia Panel: Folate 26.0, B12 1,814, Iron 69, TIBC 305, Ferritin 313  Imaging/Diagnostic Tests:  IMPRESSION: Dense rounded opacity at the left lung base, suspected pneumonia. Recommend follow-up chest x-ray to ensure resolution and thereby exclude the alternative possibility of neoplastic process.  Megan Alpha, DO 01/14/2017, 9:27 AM PGY-1, [place] Intern pager: [phone], text pages welcome

## 2017-01-15 DIAGNOSIS — N179 Acute kidney failure, unspecified: Secondary | ICD-10-CM

## 2017-01-15 DIAGNOSIS — R05 Cough: Secondary | ICD-10-CM

## 2017-01-15 DIAGNOSIS — F72 Severe intellectual disabilities: Secondary | ICD-10-CM

## 2017-01-15 DIAGNOSIS — E039 Hypothyroidism, unspecified: Secondary | ICD-10-CM

## 2017-01-15 DIAGNOSIS — J9601 Acute respiratory failure with hypoxia: Secondary | ICD-10-CM

## 2017-01-15 LAB — CULTURE, BLOOD (ROUTINE X 2): SPECIAL REQUESTS: ADEQUATE

## 2017-01-15 LAB — GLUCOSE, CAPILLARY
GLUCOSE-CAPILLARY: 295 mg/dL — AB (ref 65–99)
Glucose-Capillary: 278 mg/dL — ABNORMAL HIGH (ref 65–99)
Glucose-Capillary: 159 mg/dL — ABNORMAL HIGH (ref 65–99)
Glucose-Capillary: 107 mg/dL — ABNORMAL HIGH (ref 65–99)

## 2017-01-15 LAB — BASIC METABOLIC PANEL
ANION GAP: 7 (ref 5–15)
BUN: 7 mg/dL (ref 6–20)
CO2: 30 mmol/L (ref 22–32)
Calcium: 8.5 mg/dL — ABNORMAL LOW (ref 8.9–10.3)
Chloride: 103 mmol/L (ref 101–111)
Creatinine, Ser: 0.9 mg/dL (ref 0.44–1.00)
GFR calc Af Amer: >60 mL/min (ref >60)
GLUCOSE: 315 mg/dL — AB (ref 65–99)
POTASSIUM: 4.4 mmol/L (ref 3.5–5.1)
SODIUM: 140 mmol/L (ref 135–145)

## 2017-01-15 LAB — CBC
HCT: 24.6 % — ABNORMAL LOW (ref 36.0–46.0)
HEMOGLOBIN: 7.6 g/dL — AB (ref 12.0–15.0)
MCH: 33.3 pg (ref 26.0–34.0)
MCHC: 30.9 g/dL (ref 30.0–36.0)
MCV: 107.9 fL — AB (ref 78.0–100.0)
PLATELETS: 212 10*3/uL (ref 150–400)
RBC: 2.28 MIL/uL — AB (ref 3.87–5.11)
RDW: 18.2 % — ABNORMAL HIGH (ref 11.5–15.5)
WBC: 8 10*3/uL (ref 4.0–10.5)

## 2017-01-15 MED ORDER — AZITHROMYCIN 200 MG/5ML PO SUSR: 500.0000 mg | Freq: Every day | ORAL | 0 refills | Status: AC

## 2017-01-15 MED ORDER — METRONIDAZOLE 500 MG PO TABS: 500.0000 mg | ORAL_TABLET | Freq: Three times a day (TID) | ORAL | 0 refills | Status: AC

## 2017-01-15 NOTE — Care Management Note (Addendum)
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 Pt attends [facility] M-F/ 8am-2:30pm. [facility] provides PCS for pt everyday ( 2-3hrs).  PCP: Nuala Alpha  Action/Plan: Plan is to d/c to home today. Caregiver will provide transportation to home.  Expected Discharge Date:    01/15/2017        Expected Discharge Plan:    In-House Referral:     Discharge planning Services     Post Acute Care Choice:    Choice offered to:     DME Arranged:    DME Agency:     HH Arranged:    HH Agency:     Status of Service:     If discussed at [employer], dates discussed:    Additional Comments:  Sharin Mons, RN 01/15/2017, 12:32 PM

## 2017-01-15 NOTE — Progress Notes (Signed)
Family Medicine Teaching Service Daily Progress Note Intern Pager: [phone]  Patient name: Megan [name] Medical record number: [id number] Date of birth: 1980-12-30 Age: 36 y.o. Gender: female  Primary Care Provider: Nuala Alpha, DO Consultants: none Code Status: FULL  Pt Overview and Major Events to Date:  9/18 - CXR  Assessment and Plan:  Megan [name] is a 36 y.o. female presenting with cough, congestion, hypoxia . 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   LLL PNA 2/2 Aspiration: Possibly due to CAP vs Aspiration vs Viral URI (unlikely). Patient did remain afebrile overnight but has remained tachycardic on 1L of O2. She is maintained good saturations. CXR showed LLL opacity. - Continue Azithromycin and Metronidazole and discontinued Ceftriaxone  to cover CAP and aspirational PNA. Treatment needed for total of 7 days. Abx start date was 9/17; day 5 of abx. - Follow CBC -> WBC wnl this am - BCx with growth for gram + cocci in clusters; Pharm reported likely containment as ID Panel did not detec any Staph species or any other bacteria.  - Tylenol as needed for fever and pain - Off of O2 and on room air with sats at 99%  Acute Hypoxic Respiratory Failure: Resolved. Patient came in requiring 4L of O2 on nasal cannula to keep sats above 92%. Without supplemental O2 her sats were in the mid 80s. Improved with O2 on 4L and eventually was able to completely wean off of oxygen while in the hospital which is a return to her baseline. - Continue to monitor O2 saturation  AKI: Resolved, Cr 0.89 this am. Patient had Cr of 1.48 on admission. Baseline has been in the 0.76-0.85 range. Very likely due to dehydration since patient has been missing feeds and has improved with IVF boluses. - Continue to monitor Cr -  Continue feeds to improve overall fluid status, consider fluid bolus if needed.  Macrocytic Anemia: Chronic and stable but watching due to down trending H/H with macrocytosis. Unclear etiology but should be noted she has had this for quite some time. - Continue to follow CBC; H/H up trending and returned to her baseline this am. - Anemia panel results in labs section (see below) - TSH wnl  Hypothyroidism: stable - Continue Levothyroxine at home dose at 68mcg per tube before breakfast.  Seizure DO: Home meds: Keppra, Depakene, Vimpat. Last seizure 12/2013.  - Continue home medications  Allergic Rhinitis: - Continue Claritin  GER:  - Continue Zantac  FEN/GI: g-tube dependent; Jevity 524ml per tube at 80ml/hr daily PPx: lovenox  Disposition: discharge home today  Subjective:  Megan [name] is a 36y/o female here for suspected CAP, This morning no caregiver was in the room. [name] is unable to speak but does awaken when prompted. She was more awake and alert this morning than the previous days. She did not appear to be in any distress.  [name] continues to have recurrent aspiration issues which cannot be prevented.  Objective: Temp:  [97.8 F (36.6 C)-98.5 F (36.9 C)] 97.8 F (36.6 C) (09/21 0443) Pulse Rate:  [102-111] 102 (09/21 0443) Resp:  [15-18] 18 (09/21 0443) BP: (91-110)/(60-62) 101/62 (09/21 0443) SpO2:  [97 %-99 %] 99 % (09/21 0443) Weight:  [105 lb 13.1 oz (48 kg)] 105 lb 13.1 oz (48 kg) (09/21 0023) Physical Exam: General: Solemn but arousable, NAD; patient is paraplegic at baseline  Cardiovascular: Tachycardia, regular rhythm, normal S1, S2, no murmurs, +2 radial pulses bilaterally Respiratory: frontal lung fields CTAB, no wheezing, no crackles, no rhonchi Abdomen: soft, non-distended, non-tender, +bs in all four quadrants Extremities: no cyanosis, edema Skin: warm, dry, intact, no rashes  Laboratory:  Recent Labs Lab 01/13/17 0912 01/14/17 0503  01/15/17 0325  WBC 14.7* 7.4 8.0  HGB 7.9* 9.8* 7.6*  HCT 25.3* 31.7* 24.6*  PLT 207 PLATELET CLUMPS NOTED ON SMEAR, COUNT APPEARS DECREASED 212    Recent Labs Lab 01/11/17 1844  01/13/17 0414 01/14/17 0503 01/15/17 0325  NA 137  < > 139 139 140  K 5.6*  < > 3.8 4.4 4.4  CL 102  < > 104 103 103  CO2 25  < > 27 25 30   BUN 21*  < > 7 8 7   CREATININE 1.48*  < > 1.15* 0.89 0.90  CALCIUM 8.9  < > 8.6* 8.7* 8.5*  PROT 8.5*  --   --   --   --   BILITOT 1.4*  --   --   --   --   ALKPHOS 100  --   --   --   --   ALT 15  --   --   --   --   AST 61*  --   --   --   --   GLUCOSE 148*  < > 232* 261* 315*  < > = values in this interval not displayed. MRSA screen - positive TSH - 2.192 Phos - 4.2 Mag - 2.3 Strep Pneumo Urinary antigen - pending BCx - gram + cocci in clusters; likely contaminated 9/20 - Anemia Panel: Folate 26.0, B12 1,814, Iron 69, TIBC 305, Ferritin 313  Imaging/Diagnostic Tests:  IMPRESSION: Dense rounded opacity at the left lung base, suspected pneumonia. Recommend follow-up chest x-ray to ensure resolution and thereby exclude the alternative possibility of neoplastic process.  Nuala Alpha, DO 01/15/2017, 7:33 AM PGY-1, [place] Intern pager: [phone], text pages welcome

## 2017-01-15 NOTE — Discharge Instructions (Signed)
Ms Megan [name] was seen and treated for Pneumonia. She was given antibiotics while in the hospital and supported with oxygen to help her breathing. She progressed well and no longer needed oxygen. It is very important to finish taking the full course of antibiotics as prescribed. We believe the cause of her pneumonia was due to aspiration (having mouth and throat contents go down her wind pipe). Unfortunately, due to her unique condition there is not much that can be done to medically improve her aspiration risk.  She has a follow up appointment with our clinic on Tuesday, Sept 25th at 1:50pm. It is very important to have this follow up appointment.  If you notice she continues to have difficulty breathing, appears short of breath, or becomes agitated more than normal, develops a fever, or becomes less responsive please call the clinic or go immediately to the ED if you have serious concerns. It was a pleasure taking care of Megan [name]!

## 2017-01-15 NOTE — Discharge Summary (Signed)
[facility] Discharge Summary  Patient name: Megan [name] Medical record number: [id number] Date of birth: 08-Oct-1980 Age: 36 y.o. Gender: female Date of Admission: 01/11/2017  Date of Discharge: 01/15/2017 Admitting Physician: Leeanne Rio, MD  Primary Care Provider: Nuala Alpha, DO Consultants: none  Indication for Hospitalization:  Dyspnea  Discharge Diagnoses/Problem List:   Acute Hypoxic Respiratory Failure  Disposition: home, family act as primary home caregivers  Discharge Condition: stable  Discharge Exam:   General: NAD, resting in bed Eyes: sclerae clear ENTM: unable to visualize pharynx (difficult exam), moist mucous membranes. Rhinorrhea/nasal crusting.  Neck: supple, no cervical lymphadenopathy Cardiovascular: RRR, normal S1 and S2, no m/r/g Respiratory: CTAB, no wheezing, no crackles. Gastrointestinal: soft, nontender, nondistended. PEG tube without any surrounding erythema or discharge MSK: upper and lower extremity contractures from CP  Derm: normal capillary refill. No apparent skin lesions Neuro: known CP  Brief Hospital Course:  Ms Megan [name] is a 36y/o female who presented to the clinic on 9/17 due to difficulty breathing with a cough. At the clinic her O2 sats were seen to be around 85% that improved slightly to above 100% on 3L of O2. The history according to family is that she has been having congestion, cough, and mild agitation with poor sleep for about a week. However, it was earlier that day that they felt her breathing was "not right". She was also noted to be tachypnec, tachycardic, and had a fever. She was admitted from the clinic to the hospital and a CXR revealed a left lower lobe consolidation consistent with PNA. Given her past history of aspiration she was put on antibiotic therapy to cover for CAP, atypical bacteria, and anaerobic sources of infection. Her feeds had been discontinued at home for over a  day and she was also found to have an AKI. She received a total of 3L of bolus fluids over the course of her stay in the hospital and her feeds were restarted which resolved her AKI. She responded well to the antibiotics and her O2 sats and her breathing improved. She was weaned off of O2 and maintained her sats 97% and above. She remained afebrile for over 48 hours and her blood cultures were negative.  Issues for Follow Up:  1. This is another episode of PNA that we suspect is due to aspiration. Unfortunately, there is no medical therapy to reduce her risk of aspiration. I'm not sure the family understands this will likely be an ongoing issue and there is little to be done to prevent its recurrence. They may benefit from some education and help with managing their expectations about her health going forward. 2. Long term prognosis is guarded due to repeated aspiration PNA. The family may benefit from having her prognosis explained in more detail, however since I am her PCP I will be glad to do that at a later follow up appointment.  Significant Procedures:   9/17 - CXR: Dense rounded opacity at the left lung base, suspected pneumonia. Recommend follow-up chest x-ray to ensure resolution and thereby exclude the alternative possibility of neoplastic process.  Significant Labs and Imaging:   Recent Labs Lab 01/13/17 0912 01/14/17 0503 01/15/17 0325  WBC 14.7* 7.4 8.0  HGB 7.9* 9.8* 7.6*  HCT 25.3* 31.7* 24.6*  PLT 207 PLATELET CLUMPS NOTED ON SMEAR, COUNT APPEARS DECREASED 212    Recent Labs Lab 01/11/17 1844 01/12/17 0743 01/13/17 0414 01/14/17 0503 01/15/17 0325  NA 137 139 139 139 140  K 5.6* 4.2 3.8 4.4 4.4  CL 102 103 104 103 103  CO2 25 29 27 25 30   GLUCOSE 148* 109* 232* 261* 315*  BUN 21* 13 7 8 7   CREATININE 1.48* 1.36* 1.15* 0.89 0.90  CALCIUM 8.9 8.8* 8.6* 8.7* 8.5*  MG 2.3  --   --   --   --   PHOS 4.2  --   --   --   --   ALKPHOS 100  --   --   --   --   AST 61*   --   --   --   --   ALT 15  --   --   --   --   ALBUMIN 2.6*  --   --   --   --    MRSA screen - positive TSH - 2.192 Phos - 4.2 Mag - 2.3 Strep Pneumo Urinary antigen - pending BCx - gram + cocci in clusters; likely contaminated 9/20 - Anemia Panel: Folate 26.0, B12 1,814, Iron 69, TIBC 305, Ferritin 313  Results/Tests Pending at Time of Discharge:   Urine Step antigen test  Discharge Medications:  Allergies as of 01/15/2017      Reactions   Doxycycline Rash, Other (See Comments)   REACTION: Blisters / swelling   Other Other (See Comments)   Seasonal Allergies       Medication List    STOP taking these medications   DELSYM PO   ibuprofen 100 MG/5ML suspension Commonly known as:  ADVIL,MOTRIN     TAKE these medications   azithromycin 200 MG/5ML suspension Commonly known as:  ZITHROMAX Take 12.5 mLs (500 mg total) by mouth at bedtime.   DIASTAT ACUDIAL 10 MG Gel Generic drug:  diazepam Place 5 mg rectally once.   feeding supplement (JEVITY 1.2 CAL) Liqd Place 574 mLs into feeding tube See admin instructions. Give 2 cans (574 mls) per tube at 40 ml/hr daily What changed:  Another medication with the same name was removed. Continue taking this medication, and follow the directions you see here.   Incontinence Supplies Misc 1 Units by Does not apply route as needed (For voids or bowel movements).   levETIRAcetam 100 MG/ML solution Commonly known as:  KEPPRA GIVE (12.5 MLS) IN THE MORNING AND (12.5 MLS) IN THE EVENING BY PEG TUBE   levothyroxine 88 MCG tablet Commonly known as:  SYNTHROID, LEVOTHROID TAKE 1 TABLET (88 MCG TOTAL) PER TUBE DAILY.   loratadine 5 MG/5ML syrup Commonly known as:  CLARITIN Place 10 mLs (10 mg total) into feeding tube daily. What changed:  when to take this  reasons to take this   metroNIDAZOLE 500 MG tablet Commonly known as:  FLAGYL Place 1 tablet (500 mg total) into feeding tube 3 (three) times daily. Crush and put in  tube   ranitidine 75 MG/5ML syrup Commonly known as:  ZANTAC TAKE 5 MLS (75 MG TOTAL) BY PER J TUBE ROUTE 2 (TWO) TIMES DAILY.   Valproate Sodium 250 MG/5ML Soln solution Commonly known as:  DEPAKENE GIVE 13 ML BY PEG TUBE EVERY 6 HOURS   VIMPAT 10 MG/ML oral solution Generic drug:  lacosamide GIVE 5 MLS EVERY MORNING AND 5 MLS EVERY EVENING            Discharge Care Instructions        Start     Ordered   01/15/17 0000  azithromycin (ZITHROMAX) 200 MG/5ML suspension  Daily at bedtime  01/15/17 1412   01/15/17 0000  Increase activity slowly     01/15/17 1412   01/15/17 0000  Diet - low sodium heart healthy     01/15/17 1412   01/15/17 0000  metroNIDAZOLE (FLAGYL) 500 MG tablet  3 times daily     01/15/17 1412      Discharge Instructions: Please refer to Patient Instructions section of EMR for full details.  Patient was counseled important signs and symptoms that should prompt return to medical care, changes in medications, dietary instructions, activity restrictions, and follow up appointments.   Follow-Up Appointments:   Nuala Alpha, DO 01/15/2017, 12:10 PM PGY-1, [place]

## 2017-01-16 LAB — HEMOGLOBIN A1C
HEMOGLOBIN A1C: 6.4 % — AB (ref 4.8–5.6)
MEAN PLASMA GLUCOSE: 137 mg/dL

## 2017-01-17 LAB — CULTURE, BLOOD (ROUTINE X 2)
Culture: NO GROWTH
SPECIAL REQUESTS: ADEQUATE

## 2017-01-19 ENCOUNTER — Ambulatory Visit (INDEPENDENT_AMBULATORY_CARE_PROVIDER_SITE_OTHER): Payer: Medicaid Other | Admitting: Internal Medicine

## 2017-01-19 ENCOUNTER — Encounter: Payer: Self-pay | Admitting: Internal Medicine

## 2017-01-19 VITALS — BP 98/62 | HR 110

## 2017-01-19 DIAGNOSIS — Z09 Encounter for follow-up examination after completed treatment for conditions other than malignant neoplasm: Secondary | ICD-10-CM | POA: Diagnosis present

## 2017-01-19 DIAGNOSIS — R7303 Prediabetes: Secondary | ICD-10-CM

## 2017-01-19 MED ORDER — DEXTROMETHORPHAN-GUAIFENESIN 10-100 MG/5ML PO LIQD: 5.0000 mL | Freq: Four times a day (QID) | ORAL | 1 refills | Status: DC | PRN

## 2017-01-19 NOTE — Patient Instructions (Signed)
Thank you for bringing in Megan [name]. Cough will likely persist for weeks. You can use delsym (dextromethorphan) to help suppress cough and mucinex to try to thicken secretions to help clear them. You can give a combined product of dextromethorphan and guaifenesin (mucinex) as well.  I will place a referral for her to see a nutritionist for higher blood sugars.  Her kidney function had improved by the time she left the hospital, so it is safe to use ibuprofen and tylenol as needed.  Best, Dr. Ola Spurr

## 2017-01-19 NOTE — Progress Notes (Signed)
[facility] Progress Note  Subjective:  Megan [name] is a 36 y.o. female with history of cerebral palsy, seizures, intellectual disability, and receiving feed through G-tube. She is brought by her cousin for hospital follow-up after suspected aspiration pneumonia. Had AKI that resolved prior to discharge. Cousin believes patient is doing much better. She says she is back to her baseline level of alertness. She does continue to cough but has not had fever and has not had any low oxygen saturations at home. She completed remaining doses of azithromycin and metronidazole. Wants to know if patient can go back to school and what cough syrup she could use.  In addition, patient found to have A1c in prediabetes range (6.4) while hospitalized. Cousin says strong family history of diabetes, including in patient's mother.    Allergies  Allergen Reactions  . Doxycycline Rash and Other (See Comments)    REACTION: Blisters / swelling  . Other Other (See Comments)    Seasonal Allergies      Objective: Blood pressure 98/62, pulse (!) 110, SpO2 94 %. Of note, mildly tachycardic at baseline.  Constitutional: Well-kempt, sitting in wheelchair HENT: Minimal drooling Cardiovascular: RRR, S1, S2, no m/r/g.  Pulmonary/Chest: Lungs CTAB but transmitted upper airway sounds Abdominal: Soft. +BS, soft, NT, ND, no rebound or guarding.  Musculoskeletal: Upper limbs contractured; paraplegic Neurological: Alert but not interactive Skin: Skin is warm and dry. No rash noted.  Vitals reviewed  Assessment/Plan: Hospital discharge follow-up - Treated for pneumonia and back to baseline level of alertness. - Suggested guaifenesin and delsym for cough. Counseled cough would likely continue for several weeks - Wrote letter saying patient could go back to her school program   Prediabetes - A1c of 6.4 while hospitalized. Sedentary due to paraplegia and on tube feeds. - Will refer to Nutrition for  assistance in adjusting tube feeds  Follow-up prn.  Olene Floss, MD [place], PGY-3

## 2017-01-22 DIAGNOSIS — R7303 Prediabetes: Secondary | ICD-10-CM | POA: Insufficient documentation

## 2017-01-22 DIAGNOSIS — Z09 Encounter for follow-up examination after completed treatment for conditions other than malignant neoplasm: Secondary | ICD-10-CM | POA: Insufficient documentation

## 2017-01-22 NOTE — Assessment & Plan Note (Signed)
-   Treated for pneumonia and back to baseline level of alertness. - Suggested guaifenesin and delsym for cough. Counseled cough would likely continue for several weeks - Wrote letter saying patient could go back to her school program

## 2017-01-22 NOTE — Assessment & Plan Note (Signed)
-   A1c of 6.4 while hospitalized. Sedentary due to paraplegia and on tube feeds. - Will refer to Nutrition for assistance in adjusting tube feeds

## 2017-02-08 ENCOUNTER — Ambulatory Visit: Payer: Medicaid Other | Admitting: Registered"

## 2017-02-08 ENCOUNTER — Other Ambulatory Visit (INDEPENDENT_AMBULATORY_CARE_PROVIDER_SITE_OTHER): Payer: Self-pay | Admitting: Family

## 2017-02-08 DIAGNOSIS — G40309 Generalized idiopathic epilepsy and epileptic syndromes, not intractable, without status epilepticus: Secondary | ICD-10-CM

## 2017-02-08 DIAGNOSIS — G40209 Localization-related (focal) (partial) symptomatic epilepsy and epileptic syndromes with complex partial seizures, not intractable, without status epilepticus: Secondary | ICD-10-CM

## 2017-02-08 NOTE — Telephone Encounter (Signed)
Megan [name], This patient has not been seen since May. We gave them the refill in July. Do you want me to refill it?

## 2017-02-08 NOTE — Telephone Encounter (Signed)
Yes, the pharmacy may be mixed up or something. Please refill it. I will send a note to the scheduler to get her an appointment. Thanks.

## 2017-02-11 ENCOUNTER — Other Ambulatory Visit (INDEPENDENT_AMBULATORY_CARE_PROVIDER_SITE_OTHER): Payer: Self-pay | Admitting: Family

## 2017-02-11 DIAGNOSIS — G40309 Generalized idiopathic epilepsy and epileptic syndromes, not intractable, without status epilepticus: Secondary | ICD-10-CM

## 2017-02-11 DIAGNOSIS — G40209 Localization-related (focal) (partial) symptomatic epilepsy and epileptic syndromes with complex partial seizures, not intractable, without status epilepticus: Secondary | ICD-10-CM

## 2017-02-14 ENCOUNTER — Other Ambulatory Visit (INDEPENDENT_AMBULATORY_CARE_PROVIDER_SITE_OTHER): Payer: Self-pay | Admitting: Family

## 2017-02-14 DIAGNOSIS — G40209 Localization-related (focal) (partial) symptomatic epilepsy and epileptic syndromes with complex partial seizures, not intractable, without status epilepticus: Secondary | ICD-10-CM

## 2017-02-14 DIAGNOSIS — G40309 Generalized idiopathic epilepsy and epileptic syndromes, not intractable, without status epilepticus: Secondary | ICD-10-CM

## 2017-03-03 ENCOUNTER — Encounter (INDEPENDENT_AMBULATORY_CARE_PROVIDER_SITE_OTHER): Payer: Self-pay | Admitting: Family

## 2017-03-03 ENCOUNTER — Ambulatory Visit (INDEPENDENT_AMBULATORY_CARE_PROVIDER_SITE_OTHER): Payer: Medicaid Other | Admitting: Family

## 2017-03-03 ENCOUNTER — Telehealth (INDEPENDENT_AMBULATORY_CARE_PROVIDER_SITE_OTHER): Payer: Self-pay | Admitting: Family

## 2017-03-03 VITALS — BP 110/70 | HR 80

## 2017-03-03 DIAGNOSIS — G800 Spastic quadriplegic cerebral palsy: Secondary | ICD-10-CM

## 2017-03-03 DIAGNOSIS — Z931 Gastrostomy status: Secondary | ICD-10-CM | POA: Diagnosis not present

## 2017-03-03 DIAGNOSIS — R1319 Other dysphagia: Secondary | ICD-10-CM | POA: Diagnosis not present

## 2017-03-03 DIAGNOSIS — G8250 Quadriplegia, unspecified: Secondary | ICD-10-CM

## 2017-03-03 DIAGNOSIS — H47619 Cortical blindness, unspecified side of brain: Secondary | ICD-10-CM

## 2017-03-03 DIAGNOSIS — R7303 Prediabetes: Secondary | ICD-10-CM

## 2017-03-03 DIAGNOSIS — G40209 Localization-related (focal) (partial) symptomatic epilepsy and epileptic syndromes with complex partial seizures, not intractable, without status epilepticus: Secondary | ICD-10-CM | POA: Diagnosis not present

## 2017-03-03 DIAGNOSIS — F72 Severe intellectual disabilities: Secondary | ICD-10-CM

## 2017-03-03 DIAGNOSIS — G40309 Generalized idiopathic epilepsy and epileptic syndromes, not intractable, without status epilepticus: Secondary | ICD-10-CM | POA: Diagnosis not present

## 2017-03-03 MED ORDER — VIMPAT 10 MG/ML PO SOLN: ORAL | 5 refills | Status: DC

## 2017-03-03 MED ORDER — VALPROATE SODIUM 250 MG/5ML PO SOLN: ORAL | 5 refills | Status: DC

## 2017-03-03 MED ORDER — LEVETIRACETAM 100 MG/ML PO SOLN: ORAL | 5 refills | Status: DC

## 2017-03-03 NOTE — Patient Instructions (Signed)
Thank you for coming in today.   Instructions for you until your next appointment are as follows: 1. Continue [name]'s medications as you have been giving them.  2. Continue [name]'s feedings as you have been giving them for now. I will refer her to a nutritionist that will come to your home and evaluate her, and give recommendations for what her feedings should be for her pre-diabetes.The nutritionist will be from [place].  3. Please return for follow up in 6 months or sooner if needed.

## 2017-03-03 NOTE — Telephone Encounter (Signed)
Malachy Mood, nutritionist from [place] called me to discuss the referral I sent today for [facility]. She said that they do not do home visits unless the person has a new diagnosis of diabetes, which Megan [name] does not. She said that Medicaid would not pay for the home visit without new diagnosis of diabetes and need for change in formula. I talked with her about my concerns regarding her current nutrition and she said that with receiving 2 cans of Jevity, Megan [name] was receiving only about 500-700 calories per day and for her size should be receiving about 1200 calories per day. I am reluctant to instruct her family to increase her feedings since her blood sugars are not being monitored and her caregiver told me today that she does not have follow up PCP appointment for same. I placed a call to her PCP office and left a message requesting call back to discuss this issue further. TG

## 2017-03-03 NOTE — Progress Notes (Signed)
Patient: Megan [name] MRN: [id number] Sex: female DOB: 1980-08-07  Provider: Rockwell Germany, NP Location of Care: [facility]  Note type: Routine return visit  History of Present Illness: Referral Source: [facility] History from: [facility] chart and her cousin who is her caregiver Chief Complaint: Seizures  Megan [name] is a 36 y.o. woman with history of generalized convulsive epilepsy, complex partial seizures, spastic quadriparesis, dysphagia and cortical blindness. She was last seen August 03, 2016. "Megan [name]" is taking and tolerating Levetiracetam solution, Valproic Acid syrup and Vimpat oral solution for her seizure disorder. She has remained seizure free since her last visit.  Her cousin who is her caregiver tells me today that Megan [name] was doing well until January 11, 2017 when she was admitted to the hospital with pneumonia. She said that she recovered well from that but during the admission was found to have hyperglycemia with blood glucoses to 315 and an A1C of 6.4%. She was discharged with plans to be referred to [facility] but that did not occur because they do not manage patients on enteral feedings. Megan [name] receives all her nourishment and medications by PEG tube. Her cousin said that after learning about the pre-diabetes, her grandmother reduced the quantity of the Megan [name] feedings but wants to know exactly how much she should receive and if Megan [name] is the appropriate formula for someone with this condition.   Megan [name] has been otherwise generally healthy since her last visit and her cousin has no other health concerns other than previously mentioned.   Review of Systems: Please see the HPI for neurologic and other pertinent review of systems. Otherwise, all other systems were reviewed and were negative.    Past Medical History:  Diagnosis Date  . Allergic rhinitis 11/04/2012  . Cerebral palsy ([place])   . Cerebral palsy, quadriplegic ([place])   . CHF  (congestive heart failure) ([place])   . COPD (chronic obstructive pulmonary disease) ([place])   . Cortical blindness   . Dilantin toxicity August 2014  . Hypertension 06/23/2010  . Mental retardation   . Pneumonia December 2014   Was intubated, [facility]  . Seizures ([place])   . Vision abnormalities    Hospitalizations: Yes.  , Head Injury: No., Nervous System Infections: No., Immunizations up to date: Yes.   Past Medical History Comments: See history   Surgical History Past Surgical History:  Procedure Laterality Date  . IR GENERIC HISTORICAL  06/30/2016   IR REPLC GASTRO/COLONIC TUBE PERCUT W/FLUORO 06/30/2016 Marybelle Killings, MD [facility]-INTERV RAD  . PEG TUBE PLACEMENT      Family History family history includes Cancer in her maternal grandfather; Diabetes in her maternal aunt, maternal grandmother, and mother; Hypertension in her other; Stroke in her maternal grandmother. Family History is otherwise negative for migraines, seizures, cognitive impairment, blindness, deafness, birth defects, chromosomal disorder, autism.  Social History Social History   Socioeconomic History  . Marital status: Single    Spouse name: Not on file  . Number of children: Not on file  . Years of education: Not on file  . Highest education level: Not on file  Social Needs  . Financial resource strain: Not on file  . Food insecurity - worry: Not on file  . Food insecurity - inability: Not on file  . Transportation needs - medical: Not on file  . Transportation needs - non-medical: Not on file  Occupational History  . Not on file  Tobacco Use  . Smoking status: Never Smoker  .  Smokeless tobacco: Never Used  Substance and Sexual Activity  . Alcohol use: No  . Drug use: No  . Sexual activity: No  Other Topics Concern  . Not on file  Social History Narrative   Megan [name] attends [employer] and does well. She lives with her grandmother/guardian, Megan [name]. She enjoys exercising, standing and music.     Allergies Allergies  Allergen Reactions  . Doxycycline Rash and Other (See Comments)    REACTION: Blisters / swelling  . Other Other (See Comments)    Seasonal Allergies      Physical Exam BP 110/70   Pulse 80  General: well developed, well nourished female, seated in wheelchair, in no evident distress; black hair, brown eyes, non handed Head: normocephalic and atraumatic. Otoscopic: tympanic membranes occluded by cerumen. Pharynx: Unable to examine due to patient's inability to cooperate. Her tongue protrudes. Neck: supple with no carotid bruits. Poor head control Cardiovascular: regular rate and rhythm, no murmurs. Respiratory: Clear to auscultation bilaterally Abdomen: Bowel sounds present all four quadrants, abdomen soft, non-tender, non-distended. No hepatosplenomegaly or masses palpated. PEG tube in place. Musculoskeletal: Contractures of extremities at knees, elbows, hands and feet. Truncal hypotonia Skin: no rashes or neurocutaneous lesions  Neurologic Exam Mental Status: Awake, smiles occasionally but not socially. Evidence of profound intellectual delay. No speech, makes occasional grunting noises. Resists invasions into her space. Cranial Nerves: Fundoscopic exam - red reflex present.  Unable to fully visualize fundus.  Pupils equal briskly reactive to light. Unable to evaluate EOM and visual fields because of patient's inability to cooperate. Turns to localize sounds in the periphery but not consistently. Lower facial weakness with drooling. Tongue protrudes at all times. Poor head control.  Motor: Spastic quadriplegia with contractures at knees, elbows, hands and feet Sensory: Withdrawal x 4 Coordination: Unable to assess due to patient's inability to cooperate Gait and Station: Unable to stand or bear weight Reflexes: Diminished and symmetric. Toes downgoing. No clonus.  Impression 1.  Spastic quadriparesis 2.  Generalized convulsive epilepsy 3.  Complex partial  seizures 4.  Dysphagia 5.  Cortical blindness 6. Severe intellectual disability 7. Prediabetes 8. Dependence upon PEG tube for nourishment   Recommendations for plan of care The patient's previous [facility] records were reviewed. Megan [name] has neither had nor required imaging or lab studies since the last visit other than what was performed at her hospitalization for pneumonia in September. She is a 36 year old woman with spastic quadriparesis, generalized convulsive epilepsy, complex partial seizures, dysphagia, cortical blindness, severe intellectual disability, and prediabetes. She is taking and tolerating Levetiracetam, Valproic Acid and Vimpat for her seizure disorder, and has remained seizure free since her last visit. Megan [name] was hospitalized in September and found to have hyperglycemia and prediabetes. She was referred to the [facility] for evaluation and counseling but they do not manage persons on enteral feedings. Megan [name]'s family has questions about her tube feedings today, and I recommended a referral to [place] nutritionist for this matter. I will see Megan [name] back in follow up for her seizures in 6 months or sooner if needed. Her cousin who is her caregiver agreed with the plans made today.  The medication list was reviewed and reconciled.  No changes were made in the prescribed medications today.  A complete medication list was provided to the caregiver.  Allergies as of 03/03/2017      Reactions   Doxycycline Rash, Other (See Comments)   REACTION: Blisters / swelling   Other  Other (See Comments)   Seasonal Allergies       Medication List        Accurate as of 03/03/17 11:48 AM. Always use your most recent med list.          dextromethorphan-guaiFENesin 10-100 MG/5ML liquid Commonly known as:  ROBITUSSIN-DM Take 5 mLs by mouth every 6 (six) hours as needed for cough.   DIASTAT ACUDIAL 10 MG Gel Generic drug:  diazepam Place 5 mg rectally once.   feeding  supplement (Megan [name] 1.2 CAL) Liqd Place 574 mLs into feeding tube See admin instructions. Give 2 cans (574 mls) per tube at 40 ml/hr daily   Incontinence Supplies Misc 1 Units by Does not apply route as needed (For voids or bowel movements).   levETIRAcetam 100 MG/ML solution Commonly known as:  KEPPRA GIVE 12.5 MLS IN THE MORNING AND 12.5 ML IN THE EVENING BY PEG TUB   levothyroxine 88 MCG tablet Commonly known as:  SYNTHROID, LEVOTHROID TAKE 1 TABLET (88 MCG TOTAL) PER TUBE DAILY.   loratadine 5 MG/5ML syrup Commonly known as:  CLARITIN Place 10 mLs (10 mg total) into feeding tube daily.   ranitidine 75 MG/5ML syrup Commonly known as:  ZANTAC TAKE 5 MLS (75 MG TOTAL) BY PER J TUBE ROUTE 2 (TWO) TIMES DAILY.   Valproate Sodium 250 MG/5ML Soln solution Commonly known as:  DEPAKENE GIVE 13 ML BY PEG TUBE EVERY 6 HOURS   VIMPAT 10 MG/ML oral solution Generic drug:  lacosamide GIVE 5 MLS BY MOUTH EVERY MORNING AND 5 MLS BY MOUTH IN THE EVENING       Total time spent with the patient was 20 minutes, of which 50% or more was spent in counseling and coordination of care.   Rockwell Germany NP-C

## 2017-03-04 ENCOUNTER — Encounter: Payer: Self-pay | Admitting: *Deleted

## 2017-03-04 NOTE — Telephone Encounter (Signed)
I called and talked with Megan [name] about my concerns regarding Megan [name]'s nutrition. I will continue to be involved with her care as needed in this area. TG

## 2017-03-04 NOTE — Telephone Encounter (Signed)
I called and left a message asking for Megan [name] to return my call. TG

## 2017-03-04 NOTE — Telephone Encounter (Signed)
Received message on clinic RN voice mail from Rockwell Germany, NP at [facility] - would like to speak with someone in our office regarding patient.  Can call [phone] or [phone] (cell).    03/04/17 10:20 am called cell and left message to call our office back.  Burna Forts, BSN, RN-BC

## 2017-03-04 NOTE — Telephone Encounter (Signed)
This encounter was created in error - please disregard.

## 2017-03-04 NOTE — Telephone Encounter (Addendum)
Spoke with Rockwell Germany, NP - Patient was seen for neuro follow-up for elevated BP and A1c.  Family decreased patient's Jevity to 2 cans per day.  NP is concerned that patient is not getting enough calories/nourishment with this decrease.  Patient had nutrition management referral, but they do not see tube-feeding patients.  Also, [facility] cannot evaluate patient unless she needs formula changed or has a new diabetes diagnosis (currently just diagnosed with elevated blood sugars).    Will route note to PCP for advice and call NP back.  Burna Forts, BSN, RN-BC

## 2017-03-10 NOTE — Telephone Encounter (Signed)
LMOVM for pt to call us back. Tywana Robotham, CMA  

## 2017-03-11 NOTE — Telephone Encounter (Signed)
Caregiver informed and scheduled pt for and appt.Kindal Ponti Kennon Holter, CMA

## 2017-03-25 ENCOUNTER — Other Ambulatory Visit: Payer: Self-pay

## 2017-03-25 ENCOUNTER — Encounter: Payer: Self-pay | Admitting: Family Medicine

## 2017-03-25 ENCOUNTER — Ambulatory Visit (INDEPENDENT_AMBULATORY_CARE_PROVIDER_SITE_OTHER): Payer: Medicaid Other | Admitting: Family Medicine

## 2017-03-25 VITALS — BP 90/60 | HR 92 | Temp 98.4°F

## 2017-03-25 DIAGNOSIS — R7303 Prediabetes: Secondary | ICD-10-CM | POA: Diagnosis not present

## 2017-03-25 DIAGNOSIS — R1319 Other dysphagia: Secondary | ICD-10-CM | POA: Diagnosis not present

## 2017-03-25 LAB — GLUCOSE, POCT (MANUAL RESULT ENTRY): POC Glucose: 139 mg/dl — AB (ref 70–99)

## 2017-03-25 NOTE — Progress Notes (Signed)
Subjective: No chief complaint on file.    HPI: Megan [name] is a 36 y.o. presenting to clinic today to discuss the following:  1 Follow up for hyperglycemia  Megan [name] is a 36y/o female with a PMH of Spastic quadriparesis, seizure disorder, epilepsy, HTN, hx of recurrent aspiration PNA, dyphagia coming in due to elevated BG while she was hospitalized for aspiration PNA. While she was hospitalized she had several readings of elevated CBGs and an HbA1c on 9/21 was 6.4 indicating prediabetes. However, the family was told Megan [name] was a diabetic which caused massive confusion and frustration for the family. I asked them to come in today for further explanation and management of prediabetes. Her random blood glucose today was 139.  Per mom and cousin, she has no complaints today and is doing well. She has had no issues breathing, no changes in her overall mood, bowel movements have been regular and normal, urinating regularly with no change in the color or odor.  Health Maintenance: none today     ROS noted in HPI.   Past Medical, Surgical, Social, and Family History Reviewed & Updated per EMR.   Pertinent Historical Findings include:   Social History   Tobacco Use  Smoking Status Never Smoker  Smokeless Tobacco Never Used      Objective: BP 90/60   Pulse 92   Temp 98.4 F (36.9 C) (Axillary)   SpO2 96%  Vitals and nursing notes reviewed  Physical Exam Gen: Alert, non-verbal NAD HEENT: Normocephalic, atraumatic,CV:  RRR, no murmurs, normal S1, S2 split Resp: CTAB, no wheezing, rales, or rhonchi, comfortable work of breathing Abd: non-distended, non-tender, soft, +bs in all four quadrants MSK: spastic paralysis of all extremities, wheelchair bound Ext: no clubbing, cyanosis, or edema Skin: warm, dry, intact, no rashes Psych: appropriate behavior, mood   Results for orders placed or performed in visit on 03/25/17 (from the past 72 hour(s))  POCT glucose (manual  entry)     Status: Abnormal   Collection Time: 03/25/17  3:13 PM  Result Value Ref Range   POC Glucose 139 (A) 70 - 99 mg/dl    Assessment/Plan:  Prediabetes Patient had random blood glucose of 139 and past HbA1c of 6.4 on 01/15/2017 indicating prediabetes. I suspect her HbA1c was elevated due to her recent aspiration PNA at the time.   I plan on having it rechecked in one month.   Since she is quadriplegic her BG will have to be controlled via diet with her feedings through her g-tube. The family had already adjusted her Jevity 1.2 from 3 cans to 2 before her hospitalization in 12/2016.   No signs of dehydration or malnourishment on exam today. Urination and bowel movements normal.  Since she is at worst a prediabetic no indication to begin medications at this time. Also, tight BG control in someone with her comorbid conditions may not be beneficial.   Dysphagia Patient was changed from 3 to 2 cans of Jevity 1.2. Per NP from Neurology there was concern that Megan [name] was not getting enough nourishment with this level of feeding.  Per patient mother and cousin today they had made this change before 12/2016 due to her aspirating and concern that her feedings were running from 7am until midnight and it seemed she was not tolerating them well. They also report she was gaining a lot of weight.  Contact [facility] to adjust prescription of Jevity 1.2 from 3 cans per day to 2 as  it appears sufficient for her daily caloric needs.     PATIENT EDUCATION PROVIDED: See AVS    Diagnosis and plan along with any newly prescribed medication(s) were discussed in detail with this patient today. The patient verbalized understanding and agreed with the plan. Patient advised if symptoms worsen return to clinic or ER.   Health Maintainance:   Orders Placed This Encounter  Procedures  . POCT glucose (manual entry)    No orders of the defined types were placed in this encounter.    Harolyn Rutherford,  DO 03/25/2017, 3:06 PM PGY-1, [facility]

## 2017-03-25 NOTE — Assessment & Plan Note (Signed)
Patient had random blood glucose of 139 and past HbA1c of 6.4 on 01/15/2017 indicating prediabetes. I suspect her HbA1c was elevated due to her recent aspiration PNA at the time.   I plan on having it rechecked in one month.   Since she is quadriplegic her BG will have to be controlled via diet with her feedings through her g-tube. The family had already adjusted her Jevity 1.2 from 3 cans to 2 before her hospitalization in 12/2016.   No signs of dehydration or malnourishment on exam today. Urination and bowel movements normal.  Since she is at worst a prediabetic no indication to begin medications at this time. Also, tight BG control in someone with her comorbid conditions may not be beneficial.

## 2017-03-25 NOTE — Patient Instructions (Addendum)
It was great to see you again today! Thank you for letting me participate in your care!  Today, we discussed Megan [name]'s blood sugar levels. She had a recent HbA1c of 6.4 which indicates prediabetes. I suspect it was elevated because of her recent sickness at that time.   Her random glucose today was 139. I will follow up and see you in one month to ensure she is not diabetic but she does not meet criteria for diabetes at this time.  I will call Megan [name] and change her prescription to 2 cans of Jevity 1.2 per day.   Be well, Harolyn Rutherford, DO PGY-1, [facility]

## 2017-03-25 NOTE — Assessment & Plan Note (Signed)
Patient was changed from 3 to 2 cans of Jevity 1.2. Per NP from Neurology there was concern that Megan [name] was not getting enough nourishment with this level of feeding.  Per patient mother and cousin today they had made this change before 12/2016 due to her aspirating and concern that her feedings were running from 7am until midnight and it seemed she was not tolerating them well. They also report she was gaining a lot of weight.  Contact [facility] to adjust prescription of Jevity 1.2 from 3 cans per day to 2 as it appears sufficient for her daily caloric needs.

## 2017-03-31 ENCOUNTER — Telehealth: Payer: Self-pay | Admitting: Family Medicine

## 2017-03-31 NOTE — Telephone Encounter (Signed)
Pt cousin (Emergency Contact) drop a Medical Emergency Form for Tierney's yearly paper for school. Needs it by Friday if possible, and fax it to the school.  [phone]

## 2017-04-01 NOTE — Telephone Encounter (Signed)
Clinical info completed on medical form.  Place form in Dr. Arlana Pouch box for completion.  Andreas Newport, [place]

## 2017-04-02 ENCOUNTER — Other Ambulatory Visit: Payer: Self-pay | Admitting: Family Medicine

## 2017-04-02 DIAGNOSIS — R1319 Other dysphagia: Secondary | ICD-10-CM

## 2017-04-02 DIAGNOSIS — Z931 Gastrostomy status: Secondary | ICD-10-CM

## 2017-04-02 NOTE — Progress Notes (Signed)
Per our last conversation with patient mother I am changing her feeding order so she will not receive so many cans per month. Patient has been getting only 2 can of Jevity 1.2 for months and appears well. No signs of malnutrition or dehydration.  Patient mother knows to come in if she has any concerns.

## 2017-04-13 ENCOUNTER — Other Ambulatory Visit: Payer: Self-pay | Admitting: Pediatrics

## 2017-04-13 DIAGNOSIS — G40209 Localization-related (focal) (partial) symptomatic epilepsy and epileptic syndromes with complex partial seizures, not intractable, without status epilepticus: Secondary | ICD-10-CM

## 2017-04-13 DIAGNOSIS — G40309 Generalized idiopathic epilepsy and epileptic syndromes, not intractable, without status epilepticus: Secondary | ICD-10-CM

## 2017-04-23 ENCOUNTER — Ambulatory Visit: Payer: Medicaid Other | Admitting: Family Medicine

## 2017-06-10 ENCOUNTER — Telehealth: Payer: Self-pay | Admitting: Family Medicine

## 2017-06-10 NOTE — Telephone Encounter (Signed)
Medical exam form for school dropped off for at front desk for completion.  Verified that patient section of form has been completed.  Last DOS with PCP was 03/25/17.  Placed form in red team folder to be completed by clinical staff.  Megan [name]

## 2017-06-11 NOTE — Telephone Encounter (Signed)
Clinical info completed on Medical exam  for daycare form.  Place form in Lockamy's box for completion.  Laranda Burkemper, Salome Spotted, CMA   Also called caregiver and scheduled a TB placement for Monday as form requires this. Alexsia Klindt, Salome Spotted, CMA

## 2017-06-14 ENCOUNTER — Ambulatory Visit: Payer: Medicaid Other

## 2017-06-29 NOTE — Telephone Encounter (Signed)
Attempted to reach pt no answer, will try again later. Iyad Deroo Kennon Holter, CMA

## 2017-07-01 NOTE — Telephone Encounter (Signed)
Found form. Reino Bellis said form wasnt filled out. Put it back in your box. Junice Fei Kennon Holter, CMA

## 2017-07-05 ENCOUNTER — Telehealth: Payer: Self-pay | Admitting: Family Medicine

## 2017-07-05 NOTE — Telephone Encounter (Signed)
[employer] forms dropped off for at front desk for completion (for 2nd time, pt says forms were lost first time)  Verified that patient section of form has been completed.  Last DOS/WCC with PCP was11/29/18 .  Placed form in team folder to be completed by clinical staff.  Lynette D Sells  PLEASE CALL PATIENT ASAP REGARDING THESE FORMS.

## 2017-07-06 NOTE — Telephone Encounter (Signed)
Reviewed form from [employer] and filled out portion of clinical staff. Placed in Dr. Arlana Pouch box.Ozella Almond, CMA

## 2017-07-08 ENCOUNTER — Other Ambulatory Visit (INDEPENDENT_AMBULATORY_CARE_PROVIDER_SITE_OTHER): Payer: Self-pay | Admitting: Family

## 2017-07-08 DIAGNOSIS — G40209 Localization-related (focal) (partial) symptomatic epilepsy and epileptic syndromes with complex partial seizures, not intractable, without status epilepticus: Secondary | ICD-10-CM

## 2017-07-08 DIAGNOSIS — G40309 Generalized idiopathic epilepsy and epileptic syndromes, not intractable, without status epilepticus: Secondary | ICD-10-CM

## 2017-07-12 ENCOUNTER — Other Ambulatory Visit: Payer: Self-pay

## 2017-07-12 MED ORDER — LEVOTHYROXINE SODIUM 88 MCG PO TABS: ORAL_TABLET | ORAL | 11 refills | Status: AC

## 2017-07-19 NOTE — Telephone Encounter (Signed)
Filled out and put in folder behind front desk!

## 2017-07-19 NOTE — Telephone Encounter (Signed)
LMOVM informing dorothy you ng, that pt forms were ready to be picked up at the front desk. Deseree Kennon Holter, CMA

## 2017-07-19 NOTE — Telephone Encounter (Signed)
Guardian called again about the completed forms, they arent up front. She needs them ASAP.

## 2017-07-26 ENCOUNTER — Ambulatory Visit (INDEPENDENT_AMBULATORY_CARE_PROVIDER_SITE_OTHER): Payer: Medicaid Other | Admitting: Internal Medicine

## 2017-07-26 ENCOUNTER — Other Ambulatory Visit: Payer: Self-pay

## 2017-07-26 ENCOUNTER — Encounter: Payer: Self-pay | Admitting: Internal Medicine

## 2017-07-26 DIAGNOSIS — J181 Lobar pneumonia, unspecified organism: Secondary | ICD-10-CM

## 2017-07-26 DIAGNOSIS — J189 Pneumonia, unspecified organism: Secondary | ICD-10-CM

## 2017-07-26 MED ORDER — AMOXICILLIN-POT CLAVULANATE 250-62.5 MG/5ML PO SUSR: 500.0000 mg | Freq: Three times a day (TID) | ORAL | 0 refills | Status: DC

## 2017-07-26 NOTE — Patient Instructions (Signed)
It was so nice to see you!  I think Megan [name] has a pneumonia. I have prescribed some Augmentin. Please give her 103ml three times a day for 5 days.  If she starts looking worse, please bring her back to see [place]!  -Dr. Brett Albino

## 2017-07-27 ENCOUNTER — Telehealth: Payer: Self-pay | Admitting: Family Medicine

## 2017-07-27 NOTE — Progress Notes (Signed)
   [facility] Phone: [phone]  Subjective:  Megan [name] is a 37 year old female presenting to clinic with her caregivers for cough x 1 week. The cough is productive of yellow sputum. She has also had fevers to 100.23F axillary at home. Over a week ago, she started having rhinorrhea and red eyes. She then developed the cough. Her caregivers have been giving her Delsym and Ibuprofen, which has been helping. She has also been more sleepy than normal. No vomiting. She did have one episode of diarrhea this morning.   ROS: See HPI for pertinent positives and negatives  Past Medical History- HTN, dysphagia with PEG tube, hypothyroidism, infantile cerebral palsy with spastic quadriparesis, epilepsy, prediabetes  Family history reviewed for today's visit. No changes.  Social history- patient is a never smoker  Objective: BP 100/64   Temp 97.7 F (36.5 C) (Oral)  Gen: NAD, alert, cooperative with exam HEENT: NCAT, EOMI, MMM, TMs clear, crusted rhinorrhea present, unable to examine oropharynx Neck: FROM, supple, no cervical lymphadenopathy CV: RRR, no murmur Resp: Normal work of breathing, +focal crackles and rhonchi present in the left lung base, lungs otherwise clear  Assessment/Plan: CAP: Patient with signs of a viral URI. Then developed productive cough and fever to 100.23F. She does have focal crackles and rhonchi in the left lung base. She does have dysphagia with PEG tube, so aspiration pneumonia is also on the differential. Will treat for CAP vs aspiration pneumonia with Augmentin tid x 5 days. Will not obtain CXR at this time because it will not change management. If no improvement after antibiotics, would consider adding coverage for atypicals vs extending antibiotic course. Return precautions and reasons to go to the ED discussed in detail.    Hyman Bible, MD PGY-3

## 2017-07-27 NOTE — Telephone Encounter (Signed)
Pts cousin called wanting someone to fax Megan [name]'s school giving them permission to give her her Amoxicillin at lunch time.

## 2017-07-27 NOTE — Assessment & Plan Note (Addendum)
Patient with signs of a viral URI. Then developed productive cough and fever to 100.61F. She does have focal crackles and rhonchi in the left lung base. She does have dysphagia with PEG tube, so aspiration pneumonia is also on the differential. Will treat for CAP vs aspiration pneumonia with Augmentin tid x 5 days. Will not obtain CXR at this time because it will not change management. If no improvement after antibiotics, would consider adding coverage for atypicals vs extending antibiotic course. Return precautions and reasons to go to the ED discussed in detail.

## 2017-07-29 NOTE — Telephone Encounter (Signed)
We have a form here in the office that you can fill out. Deseree Kennon Holter, CMA

## 2017-08-01 ENCOUNTER — Other Ambulatory Visit: Payer: Self-pay | Admitting: Pediatrics

## 2017-09-21 ENCOUNTER — Other Ambulatory Visit: Payer: Self-pay

## 2017-09-21 DIAGNOSIS — K219 Gastro-esophageal reflux disease without esophagitis: Secondary | ICD-10-CM

## 2017-09-22 ENCOUNTER — Other Ambulatory Visit (INDEPENDENT_AMBULATORY_CARE_PROVIDER_SITE_OTHER): Payer: Self-pay | Admitting: Family

## 2017-09-22 DIAGNOSIS — G40209 Localization-related (focal) (partial) symptomatic epilepsy and epileptic syndromes with complex partial seizures, not intractable, without status epilepticus: Secondary | ICD-10-CM

## 2017-09-22 DIAGNOSIS — G40309 Generalized idiopathic epilepsy and epileptic syndromes, not intractable, without status epilepticus: Secondary | ICD-10-CM

## 2017-09-22 MED ORDER — RANITIDINE HCL 75 MG/5ML PO SYRP: ORAL_SOLUTION | ORAL | 10 refills | Status: AC

## 2017-10-04 ENCOUNTER — Other Ambulatory Visit: Payer: Self-pay | Admitting: Pediatrics

## 2017-10-05 ENCOUNTER — Emergency Department (HOSPITAL_COMMUNITY): Payer: Medicaid Other

## 2017-10-05 ENCOUNTER — Emergency Department (HOSPITAL_COMMUNITY)
Admission: EM | Admit: 2017-10-05 | Discharge: 2017-10-05 | Disposition: A | Discharge status: Home / Self Care | Payer: Medicaid Other | Attending: Emergency Medicine | Admitting: Emergency Medicine

## 2017-10-05 ENCOUNTER — Other Ambulatory Visit: Payer: Self-pay

## 2017-10-05 ENCOUNTER — Encounter (HOSPITAL_COMMUNITY): Payer: Self-pay

## 2017-10-05 DIAGNOSIS — Z79899 Other long term (current) drug therapy: Secondary | ICD-10-CM | POA: Insufficient documentation

## 2017-10-05 DIAGNOSIS — Z434 Encounter for attention to other artificial openings of digestive tract: Secondary | ICD-10-CM | POA: Insufficient documentation

## 2017-10-05 DIAGNOSIS — Z431 Encounter for attention to gastrostomy: Secondary | ICD-10-CM | POA: Insufficient documentation

## 2017-10-05 DIAGNOSIS — J449 Chronic obstructive pulmonary disease, unspecified: Secondary | ICD-10-CM | POA: Insufficient documentation

## 2017-10-05 DIAGNOSIS — I110 Hypertensive heart disease with heart failure: Secondary | ICD-10-CM | POA: Insufficient documentation

## 2017-10-05 DIAGNOSIS — T85528A Displacement of other gastrointestinal prosthetic devices, implants and grafts, initial encounter: Secondary | ICD-10-CM

## 2017-10-05 DIAGNOSIS — E039 Hypothyroidism, unspecified: Secondary | ICD-10-CM | POA: Insufficient documentation

## 2017-10-05 DIAGNOSIS — I509 Heart failure, unspecified: Secondary | ICD-10-CM | POA: Diagnosis not present

## 2017-10-05 DIAGNOSIS — F79 Unspecified intellectual disabilities: Secondary | ICD-10-CM | POA: Insufficient documentation

## 2017-10-05 MED ORDER — IOHEXOL 300 MG/ML  SOLN
30.0000 mL | Freq: Once | INTRAMUSCULAR | Status: AC | PRN
  Administered 2017-10-05: 30 mL via ORAL

## 2017-10-05 NOTE — ED Triage Notes (Signed)
Pt was being moved out of her wheelchair when her feeding tube became dislodged. Drainage from feeding noted. Abdominal binder in place.

## 2017-10-05 NOTE — Discharge Instructions (Addendum)
Return here as needed.  Follow-up with her doctor.  The tube is in the appropriate place on the x-rays.  I would replace the external tubing when you get home.

## 2017-10-06 ENCOUNTER — Emergency Department (HOSPITAL_COMMUNITY): Payer: Medicaid Other

## 2017-10-06 ENCOUNTER — Telehealth: Payer: Self-pay | Admitting: Gastroenterology

## 2017-10-06 ENCOUNTER — Emergency Department (HOSPITAL_COMMUNITY)
Admission: EM | Admit: 2017-10-06 | Discharge: 2017-10-06 | Disposition: A | Discharge status: Home / Self Care | Payer: Medicaid Other | Attending: Emergency Medicine | Admitting: Emergency Medicine

## 2017-10-06 ENCOUNTER — Other Ambulatory Visit: Payer: Self-pay

## 2017-10-06 ENCOUNTER — Telehealth: Payer: Self-pay

## 2017-10-06 ENCOUNTER — Telehealth (INDEPENDENT_AMBULATORY_CARE_PROVIDER_SITE_OTHER): Payer: Self-pay | Admitting: Family

## 2017-10-06 ENCOUNTER — Encounter (HOSPITAL_COMMUNITY): Payer: Self-pay | Admitting: Emergency Medicine

## 2017-10-06 DIAGNOSIS — R1310 Dysphagia, unspecified: Secondary | ICD-10-CM

## 2017-10-06 DIAGNOSIS — K9423 Gastrostomy malfunction: Secondary | ICD-10-CM | POA: Diagnosis not present

## 2017-10-06 HISTORY — PX: IR PATIENT EVAL TECH 0-60 MINS: IMG5564

## 2017-10-06 MED ORDER — LIDOCAINE VISCOUS HCL 2 % MT SOLN
OROMUCOSAL | Status: AC
  Filled 2017-10-06: qty 15

## 2017-10-06 NOTE — Telephone Encounter (Signed)
I attempted to call x2 and did not receive an answer or voicemail. I will try again later. TG

## 2017-10-06 NOTE — Discharge Instructions (Signed)
[facility].

## 2017-10-06 NOTE — ED Provider Notes (Signed)
[place] EMERGENCY DEPARTMENT Provider Note   CSN: [id number] Arrival date & time: 10/06/17  1230     History   Chief Complaint No chief complaint on file.   HPI Megan [name] is a 37 y.o. female.  37 year old female brought in by caretaker for feeding tube problem. Caretaker states patient was transferring off of the bus yesterday when her feeding tube broke. Patient was seen at [facility] long yesterday, caretaker states they evaluated the feeding tube and said that was fine and needed the external port clipped on. Caretaker states she is unable to reattach the tube to her PEG tube port. Patient has not had any medications or feeds since yesterday. No other complaints or concerns.     Past Medical History:  Diagnosis Date  . Allergic rhinitis 11/04/2012  . Cerebral palsy ([place])   . 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 CHF (congestive heart failure) ([place])   . COPD (chronic obstructive pulmonary disease) ([place])   . Cortical blindness   . Dilantin toxicity August 2014  . Hypertension 06/23/2010  . Mental retardation   . Pneumonia December 2014   Was intubated, [facility]  . Seizures ([place])   . Vision abnormalities     Patient Active Problem List   Diagnosis Date Noted  . PEG (percutaneous endoscopic gastrostomy) status ([place]) 03/03/2017  . Hospital discharge follow-up 01/22/2017  . Prediabetes 01/22/2017  . Status post insertion of percutaneous endoscopic gastrostomy (PEG) tube ([place])   . Hypoxemia 01/11/2017  . Spastic quadriplegic cerebral palsy ([place])   . Healthcare maintenance 07/08/2016  . CAP (community acquired pneumonia) 12/31/2015  . 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 Dysphagia 02/12/2014  . 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 Cough 01/26/2014  . Localization-related symptomatic epilepsy and epileptic syndromes with complex partial seizures, not intractable, without status epilepticus ([place]) 08/23/2013  . Unspecified constipation 04/18/2013    . Fibroid 04/18/2013  . Abnormal mental state 03/27/2013  . Seizure disorder ([place]) 03/27/2013  . Allergic rhinitis 11/04/2012  . 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 Encounter for long-term (current) use of other medications 10/19/2012  . Severe intellectual disabilities 10/19/2012  . 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 Feeding difficulties and mismanagement 02/19/2012  . Hypothyroidism 06/25/2010  . Hypertension 06/23/2010  . Infantile cerebral palsy ([place]) 02/21/2010  . Blindness of both eyes 02/21/2010    Past Surgical History:  Procedure Laterality Date  . IR GENERIC HISTORICAL  06/30/2016   IR REPLC GASTRO/COLONIC TUBE PERCUT W/FLUORO 06/30/2016 Marybelle Killings, MD [facility]-INTERV RAD  . PEG PLACEMENT  09/03/2011   Procedure: PERCUTANEOUS ENDOSCOPIC GASTROSTOMY (PEG) REPLACEMENT;  Surgeon: Jerene Bears, MD;  Location: [facility] ENDOSCOPY;  Service: Gastroenterology;  Laterality: N/A;  . PEG PLACEMENT  02/19/2012   Procedure: PERCUTANEOUS ENDOSCOPIC GASTROSTOMY (PEG) REPLACEMENT;  Surgeon: Lafayette Dragon, MD;  Location: [facility] ENDOSCOPY;  Service: Endoscopy;  Laterality: N/A;  needs gastrostomy button 24 Fr 4.4 and 3.5 available  . PEG PLACEMENT N/A 05/30/2013   Procedure: PERCUTANEOUS ENDOSCOPIC GASTROSTOMY (PEG) REPLACEMENT;  Surgeon: Jerene Bears, MD;  Location: [place];  Service: Gastroenterology;  Laterality: N/A;  . PEG PLACEMENT N/A 07/03/2013   Procedure: PERCUTANEOUS ENDOSCOPIC GASTROSTOMY (PEG) REPLACEMENT;  Surgeon: Lafayette Dragon, MD;  Location[facility] ENDOSCOPY;  Service: Endoscopy;  Laterality: N/A;  Replace with 20 Fr x 3.5 cm mini button-  . PEG TUBE PLACEMENT       OB History   None      Home  Medications    Prior to Admission medications   Medication Sig Start Date End Date Taking? Authorizing Provider  amoxicillin-clavulanate (AUGMENTIN) 250-62.5 MG/5ML suspension Take 10 mLs (500 mg total) by mouth 3 (three) times daily. Patient  not taking: Reported on 10/05/2017 07/26/17   MayoPete Pelt, MD  dextromethorphan (DELSYM) 30 MG/5ML liquid Take 30 mg by mouth as needed for cough.    [provider]  dextromethorphan-guaiFENesin (ROBITUSSIN-DM) 10-100 MG/5ML liquid Take 5 mLs by mouth every 6 (six) hours as needed for cough. Patient not taking: Reported on 10/05/2017 01/19/17   Rogue Bussing, MD  DIASTAT ACUDIAL 10 MG GEL Place 5 mg rectally once. 08/03/16 03/25/17  Rockwell Germany, NP  Incontinence Supplies MISC 1 Units by Does not apply route as needed (For voids or bowel movements). 04/03/16   McKeag, Marylynn Pearson, MD  levETIRAcetam (KEPPRA) 100 MG/ML solution GIVE 12.5 MLS IN THE MORNING AND 12.5 ML IN THE EVENING BY PEG TUB 09/23/17   Hickling, Princess Bruins, MD  levothyroxine (SYNTHROID, LEVOTHROID) 88 MCG tablet TAKE 1 TABLET (88 MCG TOTAL) PER TUBE DAILY. 07/12/17   Nuala Alpha, DO  loratadine (CLARITIN) 5 MG/5ML syrup Place 10 mLs (10 mg total) into feeding tube daily. Patient taking differently: Place 10 mg into feeding tube daily as needed (congestion).  07/02/15   Frazier Richards, MD  Nutritional Supplements (FEEDING SUPPLEMENT, JEVITY 1.2 CAL,) LIQD Place 574 mLs into feeding tube See admin instructions. Give 2 cans (574 mls) per tube at 40 ml/hr daily    [provider]  ranitidine (ZANTAC) 75 MG/5ML syrup TAKE 5 MLS (75 MG TOTAL) BY PER J TUBE ROUTE 2 (TWO) TIMES DAILY. 09/22/17   Nuala Alpha, DO  Valproate Sodium (DEPAKENE) 250 MG/5ML SOLN solution GIVE 13 ML BY PEG TUBE EVERY 6 HOURS Patient not taking: Reported on 10/05/2017 03/03/17   Rockwell Germany, NP  Valproate Sodium (DEPAKENE) 250 MG/5ML SOLN solution GIVE 13 ML BY PEG TUBE EVERY 6 HOURS Patient not taking: Reported on 10/05/2017 08/02/17   Rockwell Germany, NP  valproic acid (DEPAKENE) 250 MG/5ML SOLN solution GIVE 13 ML BY PEG TUBE EVERY 6 HOURS Patient taking differently: GIVE 13 ML BY PEG TUBE EVERY 12 HOURS 10/04/17   Rockwell Germany, NP  VIMPAT 10 MG/ML oral solution TAKE 5 MLS EVERY MORNING AND 5 MLS EVERY EVENING 07/09/17   Teressa Lower, MD    Family History Family History  Problem Relation Age of Onset  . Diabetes Mother   . Diabetes Maternal Grandmother   . Stroke Maternal Grandmother   . Cancer Maternal Grandfather        Died at 31  . Diabetes Maternal Aunt   . Hypertension Other        Fhx    Social History Social History   Tobacco Use  . Smoking status: Never Smoker  . Smokeless tobacco: Never Used  Substance Use Topics  . Alcohol use: No  . Drug use: No     Allergies   Doxycycline and Other   Review of Systems Review of Systems  Unable to perform ROS: Patient nonverbal  Constitutional: Negative for fever.  Gastrointestinal: Negative for constipation, diarrhea and vomiting.  Skin: Negative for wound.  All other systems reviewed and are negative.    Physical Exam Updated Vital Signs BP 121/72 (BP Location: Left Arm)   Pulse 97   Temp (!) 97.3 F (36.3 C)   Resp 16   SpO2 100%   Physical Exam  Constitutional: No distress.  Cardiovascular: Intact distal pulses.  Pulmonary/Chest: Effort normal.  Abdominal: Soft. She exhibits no distension. There is no tenderness.  PEG tube present with clear secretions   Neurological: She is alert.  Skin: Skin is warm and dry. No rash noted. She is not diaphoretic.  Nursing note and vitals reviewed.    ED Treatments / Results  Labs (all labs ordered are listed, but only abnormal results are displayed) Labs Reviewed - No data to display  EKG None  Radiology Dg Abdomen Peg Tube Location  Result Date: 10/05/2017 CLINICAL DATA:  Feeding tube placement EXAM: ABDOMEN - 1 VIEW COMPARISON:  06/30/2016 FINDINGS: 30 mL of Omnipaque 300 was injected into the gastrostomy tube to assess placement. Contrast opacifies the gastric lumen. Filling defect within gastric lumen from gastrostomy tube balloon. No contrast extravasation identified.  IMPRESSION: Gastrostomy tube is located within the gastric lumen. Electronically Signed   By: Lavonia Dana M.D.   On: 10/05/2017 21:53    Procedures Procedures (including critical care time)  Medications Ordered in ED Medications  lidocaine (XYLOCAINE) 2 % viscous mouth solution (has no administration in time range)     Initial Impression / Assessment and Plan / ED Course  I have reviewed the triage vital signs and the nursing notes.  Pertinent labs & imaging results that were available during my care of the patient were reviewed by me and considered in my medical decision making (see chart for details).  Clinical Course as of Oct 06 1712  Wed Oct 06, 2017  1610 36yo female brought in by caretaker for PEG tube problem. Patient's PEG tube broke yesterday during transfer, patient was seen at [facility] and dc home. Care taker states tube will not attach. Patient's nurse contacted central supply for a new tube, unable to locate. RN contacted ostomy nurse who does not evaluate PEG tubes. GI paged for consult, review of records it appears the last tube was placed by Dr. Olevia Perches 07/03/13.   [LM]  [facility] with IR will come and evaluate the PEG.   [LM]  6967 IR has replaced tube, patient is ready for dc home with her grandmother/caretaker.    [LM]    Clinical Course User Index [LM] Tacy Learn, PA-C      Final Clinical Impressions(s) / ED Diagnoses   Final diagnoses:  PEG tube malfunction [facility])    ED Discharge Orders    None       Tacy Learn, PA-C 10/06/17 1714    Daleen Bo, MD 10/06/17 2235

## 2017-10-06 NOTE — Telephone Encounter (Signed)
I spoke with her grandmother and legal guardian.  They were at [facility] ED for 8 hours yesterday but the PEG tube is still not working.  The patient is unable to take any fluids, nutrition or medications.  I instructed grandmother to call PCP to enlist their help in possibly getting her seen sooner at the ED and also, she needs to take [facility] back to the ED.

## 2017-10-06 NOTE — Telephone Encounter (Signed)
Received call on nurse line from [place] informed me pts PEG tube is still not working from yesterday. Pt was seen in ED yesterday per Jeralene Huff, "they did nothing and sent her home." Joshana states, pt has not had her night time meds or morning meds or any nutrition today. I spoke with preceptors and all in favor of pt returning to ED, [place] was suggested, given unhappiness with [facility]. I informed Jeralene Huff of this and she voiced understanding. I stressed the importance of taking pt ASAP, as she has not had any of her medications or nutrition.

## 2017-10-06 NOTE — Procedures (Signed)
Interventional Radiology here at bedside in the emergency room to exchange MIC-KEY gastrostomy tube.  Patient id'ed by name and DOB, existing g tube's balloon is deflated and removed. Upon inspection old g tube is fractured in 2 places. Viscous Lidocaine used to lubricate tract and new 24Fr x 2.5cm MIC-KEY inserted.  Balloon inflated with 78ml of normal saline. No complications noted. bm/jkc

## 2017-10-06 NOTE — ED Notes (Signed)
IR PA  Pam here assess G tube and try to get another one for pt

## 2017-10-06 NOTE — Telephone Encounter (Signed)
°  Who's calling (name and relationship to patient) : Constance Haw (mom) Best contact number: [phone] Provider they see: Cloretta Ned Reason for call: Caller states, Marinus Maw at [facility] and patient's feeding tube is out.  She is the cousin and needs a doctor to call her grandmother ASAP.  Grandmother: [name]  CALLER ID: [id number]  [place] REFILL ONLY  Name of prescription:  Pharmacy:

## 2017-10-06 NOTE — ED Provider Notes (Signed)
Patient placed in Quick Look pathway, seen and evaluated   Chief Complaint: PEG tube malfunction  HPI:   37 y.o. F who presents for evaluation of PEG tube malfunction that began yesterday.  Caretaker reports that yesterday the tooth broke as patient was transferring from her wheelchair.  She reports that she has been closing the site with an abdominal binder.  She has not been able to take her medications or do any feeding since the PEG tube has been broken.  The PEG tube is a 20 [place].  Caregiver denies any complaints.  ROS: PEG tube malfunction  Physical Exam:   Gen: No distress  Neuro: Awake and Alert  Skin: Warm    Focused Exam: PEG tube opening with base in place. Tube is completely dislodged. No surrounding warmth, erythema.    Initiation of care has begun. The patient has been counseled on the process, plan, and necessity for staying for the completion/evaluation, and the remainder of the medical screening examination    Volanda Napoleon, PA-C 10/06/17 [place], MD 10/06/17 [phone]

## 2017-10-06 NOTE — ED Triage Notes (Signed)
Patient brought in by caregiver today because her feeding tube broke yesterday. Caregiver has external portion that broke off. No other complaints at this time.

## 2017-10-06 NOTE — ED Notes (Signed)
A11 form sent down for 20 Fr. gastrostomy tube.

## 2017-10-07 NOTE — ED Provider Notes (Signed)
[place] DEPT Provider Note   CSN: [id number] Arrival date & time: 10/05/17  1648     History   Chief Complaint No chief complaint on file.   HPI Megan [name] is a 37 y.o. female.  HPI Patient presents to the emergency department with with dislodgment of the attachment to her gastrostomy tube.  The patient did not have the tube come completely out.  Mother states that there is been leakage from the area.  Patient said no other complaints per the mother. Past Medical History:  Diagnosis Date  . Allergic rhinitis 11/04/2012  . Cerebral palsy ([place])   . 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 CHF (congestive heart failure) ([place])   . COPD (chronic obstructive pulmonary disease) ([place])   . Cortical blindness   . Dilantin toxicity August 2014  . Hypertension 06/23/2010  . Mental retardation   . Pneumonia December 2014   Was intubated, [facility]  . Seizures ([place])   . Vision abnormalities     Patient Active Problem List   Diagnosis Date Noted  . PEG (percutaneous endoscopic gastrostomy) status ([place]) 03/03/2017  . Hospital discharge follow-up 01/22/2017  . Prediabetes 01/22/2017  . Status post insertion of percutaneous endoscopic gastrostomy (PEG) tube ([place])   . Hypoxemia 01/11/2017  . Spastic quadriplegic cerebral palsy ([place])   . Healthcare maintenance 07/08/2016  . CAP (community acquired pneumonia) 12/31/2015  . 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 Dysphagia 02/12/2014  . 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 Cough 01/26/2014  . Localization-related symptomatic epilepsy and epileptic syndromes with complex partial seizures, not intractable, without status epilepticus ([place]) 08/23/2013  . Unspecified constipation 04/18/2013  . Fibroid 04/18/2013  . Abnormal mental state 03/27/2013  . Seizure disorder ([place]) 03/27/2013  . Allergic rhinitis 11/04/2012  . 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 Encounter for long-term (current) use of  other medications 10/19/2012  . Severe intellectual disabilities 10/19/2012  . 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 Feeding difficulties and mismanagement 02/19/2012  . Hypothyroidism 06/25/2010  . Hypertension 06/23/2010  . Infantile cerebral palsy ([place]) 02/21/2010  . Blindness of both eyes 02/21/2010    Past Surgical History:  Procedure Laterality Date  . IR GENERIC HISTORICAL  06/30/2016   IR REPLC GASTRO/COLONIC TUBE PERCUT W/FLUORO 06/30/2016 Marybelle Killings, MD [facility]-INTERV RAD  . IR PATIENT EVAL TECH 0-60 MINS  10/06/2017  . PEG PLACEMENT  09/03/2011   Procedure: PERCUTANEOUS ENDOSCOPIC GASTROSTOMY (PEG) REPLACEMENT;  Surgeon: Jerene Bears, MD;  Location: [facility] ENDOS[facility];  Service: Gastroenterology;  Laterality: N/A;  . PEG PLACEMENT  02/19/2012   Procedure: PERCUTANEOUS ENDOSCOPIC GASTROSTOMY (PEG) REPLACEMENT;  Surgeon: Lafayette Dragon, MD;  Location: [facility];  Service: Endoscopy;  Laterality: N/A;  needs gastrostomy button 24 Fr 4.4 and 3.5 available  . PEG PLACEMENT N/A 05/30/2013   Procedure: PERCUTANEOUS ENDOSCOPIC GASTROSTOMY (PEG) REPLACEMENT;  Surgeon: Jerene Bears, MD;  Location: [place];  Service: Gastroenterology;  Laterality: N/A;  . PEG PLACEMENT N/A 07/03/2013   Procedure: PERCUTANEOUS ENDOSCOPIC GASTROSTOMY (PEG) REPLACEMENT;  Surgeon: Lafayette Dragon, MD;  Location: [facility];  Service: Endoscopy;  Laterality: N/A;  Replace with 20 Fr x 3.5 cm mini button-  . PEG TUBE PLACEMENT       OB History   None      Home Medications    Prior to Admission medications   Medication Sig Start Date End Date Taking? Authorizing Provider  dextromethorphan (DELSYM) 30 MG/5ML liquid Take 30 mg  by mouth as needed for cough.   Yes [provider]  levETIRAcetam (KEPPRA) 100 MG/ML solution GIVE 12.5 MLS IN THE MORNING AND 12.5 ML IN THE EVENING BY PEG TUB 09/23/17  Yes Hickling, Princess Bruins, MD  levothyroxine (SYNTHROID, LEVOTHROID) 88 MCG  tablet TAKE 1 TABLET (88 MCG TOTAL) PER TUBE DAILY. 07/12/17  Yes Lockamy, Christia Reading, DO  Nutritional Supplements (FEEDING SUPPLEMENT, JEVITY 1.2 CAL,) LIQD Place 574 mLs into feeding tube See admin instructions. Give 2 cans (574 mls) per tube at 40 ml/hr daily   Yes [provider]  ranitidine (ZANTAC) 75 MG/5ML syrup TAKE 5 MLS (75 MG TOTAL) BY PER J TUBE ROUTE 2 (TWO) TIMES DAILY. 09/22/17  Yes Lockamy, Timothy, DO  valproic acid (DEPAKENE) 250 MG/5ML SOLN solution GIVE 13 ML BY PEG TUBE EVERY 6 HOURS Patient taking differently: GIVE 13 ML BY PEG TUBE EVERY 12 HOURS 10/04/17  Yes Goodpasture, Otila Kluver, NP  VIMPAT 10 MG/ML oral solution TAKE 5 MLS EVERY MORNING AND 5 MLS EVERY EVENING 07/09/17  Yes Teressa Lower, MD  amoxicillin-clavulanate (AUGMENTIN) 250-62.5 MG/5ML suspension Take 10 mLs (500 mg total) by mouth 3 (three) times daily. Patient not taking: Reported on 10/05/2017 07/26/17   Mayo, Pete Pelt, MD  dextromethorphan-guaiFENesin (ROBITUSSIN-DM) 10-100 MG/5ML liquid Take 5 mLs by mouth every 6 (six) hours as needed for cough. Patient not taking: Reported on 10/05/2017 01/19/17   Rogue Bussing, MD  DIASTAT ACUDIAL 10 MG GEL Place 5 mg rectally once. 08/03/16 03/25/17  Rockwell Germany, NP  Incontinence Supplies MISC 1 Units by Does not apply route as needed (For voids or bowel movements). 04/03/16   McKeag, Marylynn Pearson, MD  loratadine (CLARITIN) 5 MG/5ML syrup Place 10 mLs (10 mg total) into feeding tube daily. Patient taking differently: Place 10 mg into feeding tube daily as needed (congestion).  07/02/15   Frazier Richards, MD  Valproate Sodium (DEPAKENE) 250 MG/5ML SOLN solution GIVE 13 ML BY PEG TUBE EVERY 6 HOURS Patient not taking: Reported on 10/05/2017 03/03/17   Rockwell Germany, NP  Valproate Sodium (DEPAKENE) 250 MG/5ML SOLN solution GIVE 13 ML BY PEG TUBE EVERY 6 HOURS Patient not taking: Reported on 10/05/2017 08/02/17   Rockwell Germany, NP    Family History Family History    Problem Relation Age of Onset  . Diabetes Mother   . Diabetes Maternal Grandmother   . Stroke Maternal Grandmother   . Cancer Maternal Grandfather        Died at 61  . Diabetes Maternal Aunt   . Hypertension Other        Fhx    Social History Social History   Tobacco Use  . Smoking status: Never Smoker  . Smokeless tobacco: Never Used  Substance Use Topics  . Alcohol use: No  . Drug use: No     Allergies   Doxycycline and Other   Review of Systems Review of Systems Level 5 caveat applies due to cerebral palsy and mental retardation  Physical Exam Updated Vital Signs BP 131/89 (BP Location: Right Arm)   Pulse 88   Temp 98.2 F (36.8 C) (Oral)   Resp 14   Ht 4\' 10"  (1.473 m)   Wt 47.6 kg (105 lb)   SpO2 95%   BMI 21.95 kg/m   Physical Exam  Constitutional: She appears well-developed and well-nourished. No distress.  HENT:  Head: Normocephalic and atraumatic.  Mouth/Throat: Oropharynx is clear and moist.  Eyes: Pupils are equal, round, and reactive to  light.  Neck: Normal range of motion. Neck supple.  Cardiovascular: Normal rate, regular rhythm and normal heart sounds. Exam reveals no gallop and no friction rub.  No murmur heard. Pulmonary/Chest: Effort normal and breath sounds normal. No respiratory distress. She has no wheezes.  Abdominal: Soft. Bowel sounds are normal. She exhibits no distension. There is no tenderness. There is no guarding.  The patient's gastrostomy tube is still in place did deflate the balloon and reinflated with new saline.  We applied the attachment to the gastrostomy tube.  Neurological: She is alert.  Skin: Skin is warm and dry. Capillary refill takes less than 2 seconds. No rash noted. No erythema.  Psychiatric: She has a normal mood and affect. Her behavior is normal.  Nursing note and vitals reviewed.    ED Treatments / Results  Labs (all labs ordered are listed, but only abnormal results are displayed) Labs Reviewed -  No data to display  EKG None  Radiology Dg Abdomen Peg Tube Location  Result Date: 10/05/2017 CLINICAL DATA:  Feeding tube placement EXAM: ABDOMEN - 1 VIEW COMPARISON:  06/30/2016 FINDINGS: 30 mL of Omnipaque 300 was injected into the gastrostomy tube to assess placement. Contrast opacifies the gastric lumen. Filling defect within gastric lumen from gastrostomy tube balloon. No contrast extravasation identified. IMPRESSION: Gastrostomy tube is located within the gastric lumen. Electronically Signed   By: Lavonia Dana M.D.   On: 10/05/2017 21:53   Ir Patient Eval Tech 0-60 Mins  Result Date: 10/06/2017 Kathrin Penner     10/06/2017  5:28 PM Interventional Radiology here at bedside in the emergency room to exchange MIC-KEY gastrostomy tube.  Patient id'ed by name and DOB, existing g tube's balloon is deflated and removed. Upon inspection old g tube is fractured in 2 places. Viscous Lidocaine used to lubricate tract and new 24Fr x 2.5cm MIC-KEY inserted.  Balloon inflated with 25ml of normal saline. No complications noted. bm/jkc   Procedures Procedures (including critical care time)  Medications Ordered in ED Medications  iohexol (OMNIPAQUE) 300 MG/ML solution 30 mL (30 mLs Oral Contrast Given 10/05/17 2148)     Initial Impression / Assessment and Plan / ED Course  I have reviewed the triage vital signs and the nursing notes.  Pertinent labs & imaging results that were available during my care of the patient were reviewed by me and considered in my medical decision making (see chart for details).     The tube is in appropriate place on the x-ray.  While the patient follow-up with their physician for any further care.  Final Clinical Impressions(s) / ED Diagnoses   Final diagnoses:  PEG (percutaneous endoscopic gastrostomy) adjustment/replacement/removal ([place])  Gastrojejunostomy tube dislodgement [facility])    ED Discharge Orders    None       Dalia Heading,  PA-C 10/07/17 0005    Julianne Rice, MD 10/07/17 2141

## 2017-10-11 ENCOUNTER — Telehealth (INDEPENDENT_AMBULATORY_CARE_PROVIDER_SITE_OTHER): Payer: Self-pay

## 2017-10-11 ENCOUNTER — Other Ambulatory Visit: Payer: Self-pay | Admitting: Neurology

## 2017-10-11 DIAGNOSIS — G40209 Localization-related (focal) (partial) symptomatic epilepsy and epileptic syndromes with complex partial seizures, not intractable, without status epilepticus: Secondary | ICD-10-CM

## 2017-10-11 DIAGNOSIS — G40309 Generalized idiopathic epilepsy and epileptic syndromes, not intractable, without status epilepticus: Secondary | ICD-10-CM

## 2017-10-11 MED ORDER — LACOSAMIDE 10 MG/ML PO SOLN: ORAL | 0 refills | Status: DC

## 2017-10-11 NOTE — Telephone Encounter (Signed)
Reprinted rx for Vimpat for Dr. Rogers Blocker to sign since Dr. Jordan Hawks is out of the office this week

## 2017-11-05 ENCOUNTER — Telehealth (INDEPENDENT_AMBULATORY_CARE_PROVIDER_SITE_OTHER): Payer: Self-pay | Admitting: Family

## 2017-11-05 ENCOUNTER — Other Ambulatory Visit (INDEPENDENT_AMBULATORY_CARE_PROVIDER_SITE_OTHER): Payer: Self-pay | Admitting: Family

## 2017-11-05 ENCOUNTER — Other Ambulatory Visit: Payer: Self-pay | Admitting: Pediatrics

## 2017-11-05 DIAGNOSIS — G40209 Localization-related (focal) (partial) symptomatic epilepsy and epileptic syndromes with complex partial seizures, not intractable, without status epilepticus: Secondary | ICD-10-CM

## 2017-11-05 DIAGNOSIS — G40309 Generalized idiopathic epilepsy and epileptic syndromes, not intractable, without status epilepticus: Secondary | ICD-10-CM

## 2017-11-05 MED ORDER — LACOSAMIDE 10 MG/ML PO SOLN: ORAL | 0 refills | Status: DC

## 2017-11-05 NOTE — Telephone Encounter (Signed)
-----   Message from Rockwell Germany, NP sent at 11/05/2017  2:05 PM EDT ----- Regarding: Needs appointment [name] needs an appointment with me, preferably on a day that [name] is in the office and available to me.  Thanks,  Otila Kluver

## 2017-11-05 NOTE — Telephone Encounter (Signed)
Patient scheduled for November 18, 2017 at 11:45am

## 2017-11-10 ENCOUNTER — Encounter: Payer: Self-pay | Admitting: Family Medicine

## 2017-11-10 ENCOUNTER — Ambulatory Visit (INDEPENDENT_AMBULATORY_CARE_PROVIDER_SITE_OTHER): Payer: Medicaid Other | Admitting: Family Medicine

## 2017-11-10 ENCOUNTER — Other Ambulatory Visit: Payer: Self-pay

## 2017-11-10 VITALS — BP 110/70 | HR 93 | Temp 98.0°F

## 2017-11-10 DIAGNOSIS — H1032 Unspecified acute conjunctivitis, left eye: Secondary | ICD-10-CM

## 2017-11-10 MED ORDER — CIPROFLOXACIN HCL 0.3 % OP SOLN: 2.0000 [drp] | OPHTHALMIC | 0 refills | Status: DC

## 2017-11-10 NOTE — Progress Notes (Signed)
     Subjective: Chief Complaint  Patient presents with  . pink eye    left eye  . needs order for PEG tube from [place]     HPI: Megan [name] is a 37 y.o. presenting to clinic today to discuss the following:  Left Eye Discharge Patient presents today with left eye discharge and redness with some swelling. Patient's cousin accompanies her today and has been using a warm compress, Tylenol, and OTC eye drops with improvement. Due to concern for bacterial/viral conjunctivitis that could be contagious at school they are requesting eye drops.  No fever, chills, nausea, abdominal pain, or vomiting.   Health Maintenance: None     ROS noted in HPI.   Past Medical, Surgical, Social, and Family History Reviewed & Updated per EMR.   Pertinent Historical Findings include:   Social History   Tobacco Use  Smoking Status Never Smoker  Smokeless Tobacco Never Used    Objective: BP 110/70 (BP Location: Left Arm, Patient Position: Sitting, Cuff Size: Normal)   Pulse 93   Temp 98 F (36.7 C) (Oral)   SpO2 94%  Vitals and nursing notes reviewed  Physical Exam Gen: Alert and Oriented x 3, NAD HEENT: Normocephalic, atraumatic, PERRLA, EOMI, conjunctiva is mildly swollen and erythematous, non-swollen, non-erythematous turbinates, non-erythematous pharyngeal mucosa, no exudates CV: RRR, no murmurs, normal S1, S2 split, +2 pulses dorsalis pedis bilaterally Resp: CTAB, no wheezing, rales, or rhonchi, comfortable work of breathing Abd: non-distended, non-tender, soft, +bs in all four quadrants, no hepatosplenomegaly Ext: no clubbing, cyanosis, or edema Skin: warm, dry, intact, no rashes  No results found for this or any previous visit (from the past 72 hour(s)).  Assessment/Plan:  Acute conjunctivitis of left eye Etiologies include bacterial vs viral. Patient does attend school so infectious etiology is most likely. Prescribing Cirpofloxacin ophthalmic solution to cover  for bacterial source.   PATIENT EDUCATION PROVIDED: See AVS    Diagnosis and plan along with any newly prescribed medication(s) were discussed in detail with this patient today. The patient verbalized understanding and agreed with the plan. Patient advised if symptoms worsen return to clinic or ER.   Health Maintainance:   No orders of the defined types were placed in this encounter.   Meds ordered this encounter  Medications  . DISCONTD: ciprofloxacin (CILOXAN) 0.3 % ophthalmic solution    Sig: Place 2 drops into the left eye every 4 (four) hours while awake for 5 days. Administer 1 drop, every 2 hours, while awake, for 2 days. Then 1 drop, every 4 hours, while awake, for the next 5 days.    Dispense:  2.5 mL    Refill:  0    Harolyn Rutherford, DO 11/10/2017, 4:59 PM PGY-2 [place]

## 2017-11-10 NOTE — Patient Instructions (Signed)
It was great to see you today! Thank you for letting me participate in your care!  Today, we discussed Megan [name]'s conjunctivitis. Please continue to use warm compresses and the eye drops from home. I have written you a prescription for an antibiotic. Please give her two drops in her left eye every 4 hours while she is awake. If it does not improve in 5 days please return to the office.  Be well, Harolyn Rutherford, DO PGY-2, [facility]

## 2017-11-11 ENCOUNTER — Telehealth: Payer: Self-pay | Admitting: Family Medicine

## 2017-11-11 NOTE — Telephone Encounter (Signed)
Patient's pharmacy says they do not have prescription for the eye drops yet.  Pharmacy is [facility] on [address]. Please call [phone] Dione Booze)

## 2017-11-12 ENCOUNTER — Other Ambulatory Visit: Payer: Self-pay | Admitting: Family Medicine

## 2017-11-12 MED ORDER — CIPROFLOXACIN HCL 0.3 % OP SOLN: 2.0000 [drp] | OPHTHALMIC | 0 refills | Status: AC

## 2017-11-12 NOTE — Telephone Encounter (Signed)
LMOVM for legal guardian informing them that dr resent in eye drops to the pharmacy. Loretta Kluender Kennon Holter, CMA

## 2017-11-12 NOTE — Progress Notes (Signed)
Pharmacy states they did not have the presciption for Ciloxan eye drops. Resending to pharmacy.   Epic does show I ordered the drops and prescription sent on 7/17.

## 2017-11-15 DIAGNOSIS — H1032 Unspecified acute conjunctivitis, left eye: Secondary | ICD-10-CM | POA: Insufficient documentation

## 2017-11-15 NOTE — Assessment & Plan Note (Signed)
Etiologies include bacterial vs viral. Patient does attend school so infectious etiology is most likely. Prescribing Cirpofloxacin ophthalmic solution to cover for bacterial source.

## 2017-11-18 ENCOUNTER — Ambulatory Visit (INDEPENDENT_AMBULATORY_CARE_PROVIDER_SITE_OTHER): Payer: Medicaid Other | Admitting: Family

## 2017-11-18 ENCOUNTER — Encounter (INDEPENDENT_AMBULATORY_CARE_PROVIDER_SITE_OTHER): Payer: Self-pay | Admitting: Family

## 2017-11-18 VITALS — BP 100/62 | HR 100

## 2017-11-18 DIAGNOSIS — H47619 Cortical blindness, unspecified side of brain: Secondary | ICD-10-CM | POA: Diagnosis not present

## 2017-11-18 DIAGNOSIS — F72 Severe intellectual disabilities: Secondary | ICD-10-CM

## 2017-11-18 DIAGNOSIS — Z931 Gastrostomy status: Secondary | ICD-10-CM

## 2017-11-18 DIAGNOSIS — G40209 Localization-related (focal) (partial) symptomatic epilepsy and epileptic syndromes with complex partial seizures, not intractable, without status epilepticus: Secondary | ICD-10-CM

## 2017-11-18 DIAGNOSIS — G40309 Generalized idiopathic epilepsy and epileptic syndromes, not intractable, without status epilepticus: Secondary | ICD-10-CM | POA: Diagnosis not present

## 2017-11-18 DIAGNOSIS — G8250 Quadriplegia, unspecified: Secondary | ICD-10-CM

## 2017-11-18 MED ORDER — LEVETIRACETAM 100 MG/ML PO SOLN: ORAL | 5 refills | Status: AC

## 2017-11-18 MED ORDER — VALPROIC ACID 250 MG/5ML PO SOLN: ORAL | 5 refills | Status: AC

## 2017-11-18 MED ORDER — LACOSAMIDE 10 MG/ML PO SOLN: ORAL | 5 refills | Status: DC

## 2017-11-18 NOTE — Patient Instructions (Signed)
Thank you for coming in today.   Instructions for you until your next appointment are as follows: 1. Continue giving Megan [name]'s medications as you have been giving them.  2. Let me know if she has any seizures or if you have any concerns. 3. Please plan to return for follow up in 6 months or sooner if needed.

## 2017-11-18 NOTE — Progress Notes (Signed)
Patient: Megan [name] MRN: [id number] Sex: female DOB: [birth year]  Provider: Rockwell Germany, NP Location of Care: [facility]  Note type: Routine return visit  History of Present Illness: Referral Source: [facility] History from: [facility] chart and cousin Chief Complaint: Seizures  Megan [name] is a 37 y.o. with history of generalized convulsive epilepsy, complex partial seizures, spastic quadriparesis, dysphagia and cortical blindness. She was last seen March 03, 2017. Megan [name] is taking and tolerating Levetiracetam solution, Valproic Acid syrup and Vimpat oral solution and has remained seizure free since her last visit. Her cousin who is her caregiver says that Megan [name] was seen in the ER recently for problems with her PEG tube but that in general she has been healthy and doing well since she was last seen. Her cousin has no other health concerns for Megan [name] today other than previously mentioned.  Review of Systems: Please see the HPI for neurologic and other pertinent review of systems. Otherwise, all other systems were reviewed and were negative.    Past Medical History:  Diagnosis Date  . Allergic rhinitis 11/04/2012  . Cerebral palsy ([place])   . 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 CHF (congestive heart failure) ([place])   . COPD (chronic obstructive pulmonary disease) ([place])   . Cortical blindness   . Dilantin toxicity August 2014  . Hypertension 06/23/2010  . Mental retardation   . Pneumonia December 2014   Was intubated, [facility]  . Seizures ([place])   . Vision abnormalities    Hospitalizations: No., Head Injury: No., Nervous System Infections: No., Immunizations up to date: Yes.   Past Medical History Comments: See HPI   Surgical History Past Surgical History:  Procedure Laterality Date  . IR GENERIC HISTORICAL  06/30/2016   IR REPLC GASTRO/COLONIC TUBE PERCUT W/FLUORO 06/30/2016 Marybelle Killings, MD [facility]-INTERV RAD  . IR PATIENT EVAL TECH 0-60 MINS   10/06/2017  . PEG PLACEMENT  09/03/2011   Procedure: PERCUTANEOUS ENDOSCOPIC GASTROSTOMY (PEG) REPLACEMENT;  Surgeon: Jerene Bears, MD;  Location: [facility] ENDOS[facility];  Service: Gastroenterology;  Laterality: N/A;  . PEG PLACEMENT  02/19/2012   Procedure: PERCUTANEOUS ENDOSCOPIC GASTROSTOMY (PEG) REPLACEMENT;  Surgeon: Lafayette Dragon, MD;  Location: [facility];  Service: Endoscopy;  Laterality: N/A;  needs gastrostomy button 24 Fr 4.4 and 3.5 available  . PEG PLACEMENT N/A 05/30/2013   Procedure: PERCUTANEOUS ENDOSCOPIC GASTROSTOMY (PEG) REPLACEMENT;  Surgeon: Jerene Bears, MD;  Location: [place];  Service: Gastroenterology;  Laterality: N/A;  . PEG PLACEMENT N/A 07/03/2013   Procedure: PERCUTANEOUS ENDOSCOPIC GASTROSTOMY (PEG) REPLACEMENT;  Surgeon: Lafayette Dragon, MD;  Location: [facility];  Service: Endoscopy;  Laterality: N/A;  Replace with 20 Fr x 3.5 cm mini button-  . PEG TUBE PLACEMENT      Family History family history includes Cancer in her maternal grandfather; Diabetes in her maternal aunt, maternal grandmother, and mother; Hypertension in her other; Stroke in her maternal grandmother. Family History is otherwise negative for migraines, seizures, cognitive impairment, blindness, deafness, birth defects, chromosomal disorder, autism.  Social History Social History   Socioeconomic History  . Marital status: Single    Spouse name: Not on file  . Number of children: Not on file  . Years of education: Not on file  . Highest education level: Not on file  Occupational History  . Not on file  Social Needs  . Financial resource strain: Not on file  . Food insecurity:    Worry: Not on file  Inability: Not on file  . Transportation needs:    Medical: Not on file    Non-medical: Not on file  Tobacco Use  . Smoking status: Never Smoker  . Smokeless tobacco: Never Used  Substance and Sexual Activity  . Alcohol use: No  . Drug use: No  . Sexual activity: Never  Lifestyle  .  Physical activity:    Days per week: Not on file    Minutes per session: Not on file  . Stress: Not on file  Relationships  . Social connections:    Talks on phone: Not on file    Gets together: Not on file    Attends religious service: Not on file    Active member of club or organization: Not on file    Attends meetings of clubs or organizations: Not on file    Relationship status: Not on file  Other Topics Concern  . Not on file  Social History Narrative   Megan [name] attends [employer] and does well. She lives with her grandmother/guardian, [name], her cousin, and her great aunt. She enjoys exercising, standing and music.    Allergies Allergies  Allergen Reactions  . Doxycycline Rash and Other (See Comments)    REACTION: Blisters / swelling  . Other Other (See Comments)    Seasonal Allergies      Physical Exam BP 100/62   Pulse 100  General: well developed, well nourished female, seated in wheelchair, in no evident distress; black hair, brown eyes, non handed Head: normocephalic and atraumatic. Oropharynx appears benign other than bilateral tympanic membranes occluded by cerumen. Unable to fully examine her pharynx due to her inability to cooperate. Her tongue protrudes. No dysmorphic features. Neck: supple with no carotid bruits. Cardiovascular: regular rate and rhythm, no murmurs. Respiratory: Clear to auscultation bilaterally Abdomen: Bowel sounds present all four quadrants, abdomen soft, non-tender, non-distended. PEG tube in place with clean, dry site. Musculoskeletal: No skeletal deformities or obvious scoliosis. Has contractures at knees, elbows, hands and feet. Truncal hypotonia Skin: no rashes or neurocutaneous lesions  Neurologic Exam Mental Status: Awake and fully alert. Has no language.  Smiles occasionally but not socially. Evidence of profound intellectual delay.  Resistant to invasions in to her space Cranial Nerves: Fundoscopic exam - red reflex  present.  Unable to fully visualize fundus.  Pupils equal briskly reactive to light.  Turns to localize faces and objects in the periphery. Turns to localize sounds in the periphery. Facial movements are asymmetric, has lower facial weakness with drooling.  Neck flexion and extension normal. Motor: Spastic quadriparesis with contractures at the knees, elbows, hands and feet. Has truncal hypotonia and difficulty sitting upright without support. Sensory: Withdrawal x 4 Coordination: Unable to adequately assess due to patient's inability to participate in examination. Does not reach for objects. Gait and Station: Unable to stand and bear weight Reflexes: Diminished and symmetric. Toes neutral. No clonus  Impression 1.  Spastic quadriparesis 2.  Generalized convulsive epilepsy 3.  Complex partial seizures 4.  Dysphagia 5.  Cortical blindness 6.  Severe intellectual delay 7.  History of prediabetes 8.  Dependence upon PEG tube for nourishment   Recommendations for plan of care The patient's previous [facility] records were reviewed. Megan [name] has neither had nor required imaging or lab studies since the last visit. She is a 37 year old woman with history of spastic quadriparesis, generalized convulsive epilepsy, complex partial seizures, dysphagia, cortical blindness, and severe intellectual delay. She is taking and tolerating Levetiracetam oral  solution, Valproic acid syrup, and Vimpat oral solution, and has remained seizure free since her last visit. She is cared for at home by her family and is doing well at this time. Megan [name] will continue her medications without change and will return for follow up in 6 months or sooner if needed. Her caregiver agreed with the plans made today.   The medication list was reviewed and reconciled.  No changes were made in the prescribed medications today.  A complete medication list was provided to the caregiver.  Allergies as of 11/18/2017      Reactions   Doxycycline  Rash, Other (See Comments)   REACTION: Blisters / swelling   Other Other (See Comments)   Seasonal Allergies       Medication List        Accurate as of 11/18/17  4:26 PM. Always use your most recent med list.          DELSYM 30 MG/5ML liquid Generic drug:  dextromethorphan Take 30 mg by mouth as needed for cough.   DIASTAT ACUDIAL 10 MG Gel Generic drug:  diazepam Place 5 mg rectally once.   feeding supplement (JEVITY 1.2 CAL) Liqd Place 574 mLs into feeding tube See admin instructions. Give 2 cans (574 mls) per tube at 40 ml/hr daily   Incontinence Supplies Misc 1 Units by Does not apply route as needed (For voids or bowel movements).   lacosamide 10 MG/ML oral solution Commonly known as:  VIMPAT TAKE 5MLS EVERY MORNING AND EVENING   levETIRAcetam 100 MG/ML solution Commonly known as:  KEPPRA GIVE 12.5 MLS IN THE MORNING AND 12.5 ML IN THE EVENING BY PEG TUB   levothyroxine 88 MCG tablet Commonly known as:  SYNTHROID, LEVOTHROID TAKE 1 TABLET (88 MCG TOTAL) PER TUBE DAILY.   loratadine 5 MG/5ML syrup Commonly known as:  CLARITIN Place 10 mLs (10 mg total) into feeding tube daily.   ranitidine 75 MG/5ML syrup Commonly known as:  ZANTAC TAKE 5 MLS (75 MG TOTAL) BY PER J TUBE ROUTE 2 (TWO) TIMES DAILY.   valproic acid 250 MG/5ML Soln solution Commonly known as:  DEPAKENE GIVE 13 ML BY PEG TUBE EVERY 6 HOURS       Total time spent with the patient was 20 minutes, of which 50% or more was spent in counseling and coordination of care.   Rockwell Germany NP-C

## 2017-11-19 ENCOUNTER — Other Ambulatory Visit: Payer: Self-pay | Admitting: Family Medicine

## 2017-11-19 NOTE — Progress Notes (Signed)
Family was recently seen in ED due to stomach tube becoming dislodged. The ED had difficulty obtaining te correct tube to replace the one that became loose. I am ordering a g-tube for family to have in case the one they currently have becomes dislodged again.   Will order through home health.

## 2017-12-14 ENCOUNTER — Other Ambulatory Visit (INDEPENDENT_AMBULATORY_CARE_PROVIDER_SITE_OTHER): Payer: Self-pay | Admitting: Family

## 2017-12-14 DIAGNOSIS — G40309 Generalized idiopathic epilepsy and epileptic syndromes, not intractable, without status epilepticus: Secondary | ICD-10-CM

## 2017-12-14 DIAGNOSIS — G40209 Localization-related (focal) (partial) symptomatic epilepsy and epileptic syndromes with complex partial seizures, not intractable, without status epilepticus: Secondary | ICD-10-CM

## 2017-12-17 ENCOUNTER — Other Ambulatory Visit (INDEPENDENT_AMBULATORY_CARE_PROVIDER_SITE_OTHER): Payer: Self-pay | Admitting: Family

## 2017-12-17 DIAGNOSIS — G40209 Localization-related (focal) (partial) symptomatic epilepsy and epileptic syndromes with complex partial seizures, not intractable, without status epilepticus: Secondary | ICD-10-CM

## 2017-12-17 DIAGNOSIS — G40309 Generalized idiopathic epilepsy and epileptic syndromes, not intractable, without status epilepticus: Secondary | ICD-10-CM

## 2017-12-17 MED ORDER — VIMPAT 10 MG/ML PO SOLN: ORAL | 5 refills | Status: AC

## 2017-12-24 ENCOUNTER — Other Ambulatory Visit: Payer: Self-pay

## 2017-12-24 ENCOUNTER — Inpatient Hospital Stay (HOSPITAL_COMMUNITY)
Admission: EM | Admit: 2017-12-24 | Discharge: 2017-12-26 | DRG: 871 | Disposition: E | Payer: Medicaid Other | Attending: Emergency Medicine | Admitting: Emergency Medicine

## 2017-12-24 ENCOUNTER — Emergency Department (HOSPITAL_COMMUNITY): Payer: Medicaid Other

## 2017-12-24 ENCOUNTER — Emergency Department: Payer: Self-pay

## 2017-12-24 ENCOUNTER — Encounter (HOSPITAL_COMMUNITY): Payer: Self-pay | Admitting: Emergency Medicine

## 2017-12-24 DIAGNOSIS — J690 Pneumonitis due to inhalation of food and vomit: Secondary | ICD-10-CM | POA: Diagnosis present

## 2017-12-24 DIAGNOSIS — R579 Shock, unspecified: Secondary | ICD-10-CM | POA: Diagnosis present

## 2017-12-24 DIAGNOSIS — G40309 Generalized idiopathic epilepsy and epileptic syndromes, not intractable, without status epilepticus: Secondary | ICD-10-CM | POA: Diagnosis not present

## 2017-12-24 DIAGNOSIS — J9600 Acute respiratory failure, unspecified whether with hypoxia or hypercapnia: Secondary | ICD-10-CM | POA: Diagnosis not present

## 2017-12-24 DIAGNOSIS — E039 Hypothyroidism, unspecified: Secondary | ICD-10-CM | POA: Diagnosis present

## 2017-12-24 DIAGNOSIS — E872 Acidosis, unspecified: Secondary | ICD-10-CM | POA: Diagnosis present

## 2017-12-24 DIAGNOSIS — G809 Cerebral palsy, unspecified: Secondary | ICD-10-CM | POA: Diagnosis not present

## 2017-12-24 DIAGNOSIS — A4151 Sepsis due to Escherichia coli [E. coli]: Secondary | ICD-10-CM | POA: Diagnosis present

## 2017-12-24 DIAGNOSIS — G800 Spastic quadriplegic cerebral palsy: Secondary | ICD-10-CM | POA: Diagnosis present

## 2017-12-24 DIAGNOSIS — R1310 Dysphagia, unspecified: Secondary | ICD-10-CM

## 2017-12-24 DIAGNOSIS — Z931 Gastrostomy status: Secondary | ICD-10-CM | POA: Diagnosis not present

## 2017-12-24 DIAGNOSIS — Z515 Encounter for palliative care: Secondary | ICD-10-CM | POA: Diagnosis not present

## 2017-12-24 DIAGNOSIS — D638 Anemia in other chronic diseases classified elsewhere: Secondary | ICD-10-CM | POA: Diagnosis present

## 2017-12-24 DIAGNOSIS — Z66 Do not resuscitate: Secondary | ICD-10-CM | POA: Diagnosis not present

## 2017-12-24 DIAGNOSIS — B9620 Unspecified Escherichia coli [E. coli] as the cause of diseases classified elsewhere: Secondary | ICD-10-CM | POA: Diagnosis present

## 2017-12-24 DIAGNOSIS — Z8249 Family history of ischemic heart disease and other diseases of the circulatory system: Secondary | ICD-10-CM

## 2017-12-24 DIAGNOSIS — G9341 Metabolic encephalopathy: Secondary | ICD-10-CM | POA: Diagnosis present

## 2017-12-24 DIAGNOSIS — G808 Other cerebral palsy: Secondary | ICD-10-CM

## 2017-12-24 DIAGNOSIS — G40409 Other generalized epilepsy and epileptic syndromes, not intractable, without status epilepticus: Secondary | ICD-10-CM | POA: Diagnosis present

## 2017-12-24 DIAGNOSIS — K567 Ileus, unspecified: Secondary | ICD-10-CM

## 2017-12-24 DIAGNOSIS — G8250 Quadriplegia, unspecified: Secondary | ICD-10-CM | POA: Diagnosis present

## 2017-12-24 DIAGNOSIS — E875 Hyperkalemia: Secondary | ICD-10-CM | POA: Diagnosis present

## 2017-12-24 DIAGNOSIS — R571 Hypovolemic shock: Secondary | ICD-10-CM | POA: Diagnosis present

## 2017-12-24 DIAGNOSIS — R7881 Bacteremia: Secondary | ICD-10-CM | POA: Diagnosis present

## 2017-12-24 DIAGNOSIS — E1101 Type 2 diabetes mellitus with hyperosmolarity with coma: Secondary | ICD-10-CM | POA: Diagnosis not present

## 2017-12-24 DIAGNOSIS — N179 Acute kidney failure, unspecified: Secondary | ICD-10-CM | POA: Diagnosis present

## 2017-12-24 DIAGNOSIS — I10 Essential (primary) hypertension: Secondary | ICD-10-CM | POA: Diagnosis present

## 2017-12-24 DIAGNOSIS — R6521 Severe sepsis with septic shock: Secondary | ICD-10-CM | POA: Diagnosis present

## 2017-12-24 DIAGNOSIS — R7303 Prediabetes: Secondary | ICD-10-CM | POA: Diagnosis present

## 2017-12-24 HISTORY — PX: IR US GUIDE VASC ACCESS LEFT: IMG2389

## 2017-12-24 LAB — BLOOD GAS, ARTERIAL
ACID-BASE DEFICIT: 9.9 mmol/L — AB (ref 0.0–2.0)
Acid-base deficit: 5.9 mmol/L — ABNORMAL HIGH (ref 0.0–2.0)
BICARBONATE: 16.7 mmol/L — AB (ref 20.0–28.0)
Bicarbonate: 20.1 mmol/L (ref 20.0–28.0)
DELIVERY SYSTEMS: POSITIVE
DRAWN BY: 270211
Delivery systems: POSITIVE
Drawn by: 270211
EXPIRATORY PAP: 5
Expiratory PAP: 5
FIO2: 1
FIO2: 1
Inspiratory PAP: 15
Inspiratory PAP: 15
MODE: POSITIVE
Mode: POSITIVE
O2 Saturation: 96.5 %
O2 Saturation: 99.7 %
PATIENT TEMPERATURE: 98.6
PCO2 ART: 41.3 mmHg (ref 32.0–48.0)
PH ART: 7.23 — AB (ref 7.350–7.450)
PO2 ART: 119 mmHg — AB (ref 83.0–108.0)
Patient temperature: 98.6
pCO2 arterial: 44.5 mmHg (ref 32.0–48.0)
pH, Arterial: 7.278 — ABNORMAL LOW (ref 7.350–7.450)
pO2, Arterial: 402 mmHg — ABNORMAL HIGH (ref 83.0–108.0)

## 2017-12-24 LAB — BASIC METABOLIC PANEL
BUN: 66 mg/dL — AB (ref 6–20)
BUN: 52 mg/dL — AB (ref 6–20)
CALCIUM: 7.8 mg/dL — AB (ref 8.9–10.3)
CALCIUM: 7.2 mg/dL — AB (ref 8.9–10.3)
CO2: 22 mmol/L (ref 22–32)
CO2: 17 mmol/L — ABNORMAL LOW (ref 22–32)
Chloride: >130 mmol/L (ref 98–111)
Chloride: >130 mmol/L (ref 98–111)
Creatinine, Ser: 4.19 mg/dL — ABNORMAL HIGH (ref 0.44–1.00)
Creatinine, Ser: 3.12 mg/dL — ABNORMAL HIGH (ref 0.44–1.00)
GFR calc Af Amer: 14 mL/min — ABNORMAL LOW (ref >60)
GFR calc Af Amer: 21 mL/min — ABNORMAL LOW (ref >60)
GFR, EST NON AFRICAN AMERICAN: 12 mL/min — AB (ref >60)
GFR, EST NON AFRICAN AMERICAN: 18 mL/min — AB (ref >60)
GLUCOSE: 744 mg/dL — AB (ref 70–99)
GLUCOSE: 474 mg/dL — AB (ref 70–99)
POTASSIUM: 2.2 mmol/L — AB (ref 3.5–5.1)
Potassium: 2.2 mmol/L — CL (ref 3.5–5.1)
SODIUM: 169 mmol/L — AB (ref 135–145)
SODIUM: 161 mmol/L — AB (ref 135–145)

## 2017-12-24 LAB — GLUCOSE, CAPILLARY
GLUCOSE-CAPILLARY: 511 mg/dL — AB (ref 70–99)
GLUCOSE-CAPILLARY: 339 mg/dL — AB (ref 70–99)
Glucose-Capillary: 352 mg/dL — ABNORMAL HIGH (ref 70–99)
Glucose-Capillary: 497 mg/dL — ABNORMAL HIGH (ref 70–99)

## 2017-12-24 LAB — CBC WITH DIFFERENTIAL/PLATELET
Band Neutrophils: 4 %
Basophils Absolute: 0 10*3/uL (ref 0.0–0.1)
Basophils Relative: 0 %
EOS PCT: 0 %
Eosinophils Absolute: 0 10*3/uL (ref 0.0–0.7)
HCT: 41 % (ref 36.0–46.0)
Hemoglobin: 11.3 g/dL — ABNORMAL LOW (ref 12.0–15.0)
LYMPHS ABS: 3.1 10*3/uL (ref 0.7–4.0)
LYMPHS PCT: 57 %
MCH: 34 pg (ref 26.0–34.0)
MCHC: 27.6 g/dL — AB (ref 30.0–36.0)
MCV: 123.5 fL — ABNORMAL HIGH (ref 78.0–100.0)
MONOS PCT: 12 %
Metamyelocytes Relative: 12 %
Monocytes Absolute: 0.6 10*3/uL (ref 0.1–1.0)
Myelocytes: 2 %
NEUTROS ABS: 1.7 10*3/uL (ref 1.7–7.7)
Neutrophils Relative %: 13 %
PLATELETS: 290 10*3/uL (ref 150–400)
RBC: 3.32 MIL/uL — AB (ref 3.87–5.11)
RDW: 18.9 % — AB (ref 11.5–15.5)
WBC: 5.4 10*3/uL (ref 4.0–10.5)
nRBC: 6 /100 WBC — ABNORMAL HIGH

## 2017-12-24 LAB — COMPREHENSIVE METABOLIC PANEL
ALT: 12 U/L (ref 0–44)
AST: 33 U/L (ref 15–41)
Albumin: 2.5 g/dL — ABNORMAL LOW (ref 3.5–5.0)
Alkaline Phosphatase: 112 U/L (ref 38–126)
Anion gap: 27 — ABNORMAL HIGH (ref 5–15)
BUN: 74 mg/dL — ABNORMAL HIGH (ref 6–20)
CHLORIDE: 119 mmol/L — AB (ref 98–111)
CO2: 18 mmol/L — ABNORMAL LOW (ref 22–32)
CREATININE: 4.29 mg/dL — AB (ref 0.44–1.00)
Calcium: 9.2 mg/dL (ref 8.9–10.3)
GFR, EST AFRICAN AMERICAN: 14 mL/min — AB (ref >60)
GFR, EST NON AFRICAN AMERICAN: 12 mL/min — AB (ref >60)
Glucose, Bld: 1007 mg/dL (ref 70–99)
Potassium: 3.8 mmol/L (ref 3.5–5.1)
Sodium: 164 mmol/L (ref 135–145)
Total Bilirubin: 0.4 mg/dL (ref 0.3–1.2)
Total Protein: 8.1 g/dL (ref 6.5–8.1)

## 2017-12-24 LAB — I-STAT BETA HCG BLOOD, ED (MC, WL, AP ONLY): I-stat hCG, quantitative: <5 m[IU]/mL (ref <5)

## 2017-12-24 LAB — CBG MONITORING, ED
Glucose-Capillary: >600 mg/dL (ref 70–99)
Glucose-Capillary: >600 mg/dL (ref 70–99)
Glucose-Capillary: 542 mg/dL (ref 70–99)
Glucose-Capillary: 545 mg/dL (ref 70–99)

## 2017-12-24 LAB — TROPONIN I: Troponin I: 0.4 ng/mL (ref <0.03)

## 2017-12-24 LAB — I-STAT CG4 LACTIC ACID, ED: LACTIC ACID, VENOUS: 15.36 mmol/L — AB (ref 0.5–1.9)

## 2017-12-24 LAB — KETONES, URINE: Ketones, ur: NEGATIVE mg/dL

## 2017-12-24 LAB — PROCALCITONIN: PROCALCITONIN: 63.74 ng/mL

## 2017-12-24 LAB — I-STAT TROPONIN, ED: Troponin i, poc: 0.67 ng/mL (ref 0.00–0.08)

## 2017-12-24 LAB — LIPASE, BLOOD: LIPASE: 76 U/L — AB (ref 11–51)

## 2017-12-24 LAB — LACTIC ACID, PLASMA: Lactic Acid, Venous: 10.9 mmol/L (ref 0.5–1.9)

## 2017-12-24 MED ORDER — POTASSIUM CHLORIDE 20 MEQ/15ML (10%) PO SOLN
40.0000 meq | Freq: Once | ORAL | Status: AC
  Administered 2017-12-24: 40 meq via ORAL
  Filled 2017-12-24: qty 30

## 2017-12-24 MED ORDER — SODIUM CHLORIDE 0.9 % IV SOLN
1250.0000 mg | Freq: Two times a day (BID) | INTRAVENOUS | Status: DC
  Administered 2017-12-24 – 2017-12-25 (×2): 1250 mg via INTRAVENOUS
  Filled 2017-12-24 (×3): qty 12.5

## 2017-12-24 MED ORDER — VANCOMYCIN VARIABLE DOSE PER UNSTABLE RENAL FUNCTION (PHARMACIST DOSING): Status: DC

## 2017-12-24 MED ORDER — POTASSIUM CHLORIDE 10 MEQ/50ML IV SOLN
10.0000 meq | INTRAVENOUS | Status: DC
  Administered 2017-12-24 – 2017-12-25 (×4): 10 meq via INTRAVENOUS
  Filled 2017-12-24 (×3): qty 50

## 2017-12-24 MED ORDER — SODIUM CHLORIDE 0.9 % IV BOLUS
1000.0000 mL | Freq: Once | INTRAVENOUS | Status: AC
  Administered 2017-12-24: 1000 mL via INTRAVENOUS

## 2017-12-24 MED ORDER — VANCOMYCIN HCL IN DEXTROSE 1-5 GM/200ML-% IV SOLN: 1000.0000 mg | Freq: Once | INTRAVENOUS | Status: DC

## 2017-12-24 MED ORDER — FAMOTIDINE IN NACL 20-0.9 MG/50ML-% IV SOLN
20.0000 mg | INTRAVENOUS | Status: DC
  Administered 2017-12-24: 20 mg via INTRAVENOUS
  Filled 2017-12-24: qty 50

## 2017-12-24 MED ORDER — VALPROATE SODIUM 500 MG/5ML IV SOLN
650.0000 mg | Freq: Three times a day (TID) | INTRAVENOUS | Status: DC
  Administered 2017-12-24 – 2017-12-25 (×2): 650 mg via INTRAVENOUS
  Filled 2017-12-24 (×4): qty 6.5

## 2017-12-24 MED ORDER — SODIUM CHLORIDE 0.9 % IV SOLN
2.0000 g | Freq: Once | INTRAVENOUS | Status: DC
  Filled 2017-12-24: qty 2

## 2017-12-24 MED ORDER — SODIUM CHLORIDE 0.9 % IV SOLN
50.0000 mg | Freq: Two times a day (BID) | INTRAVENOUS | Status: DC
  Administered 2017-12-24 – 2017-12-25 (×2): 50 mg via INTRAVENOUS
  Filled 2017-12-24 (×3): qty 5

## 2017-12-24 MED ORDER — LEVOTHYROXINE SODIUM 100 MCG IV SOLR
44.0000 ug | Freq: Every day | INTRAVENOUS | Status: DC
  Administered 2017-12-25: 44 ug via INTRAVENOUS
  Filled 2017-12-24: qty 5

## 2017-12-24 MED ORDER — POTASSIUM CHLORIDE CRYS ER 20 MEQ PO TBCR: 40.0000 meq | EXTENDED_RELEASE_TABLET | Freq: Once | ORAL | Status: DC

## 2017-12-24 MED ORDER — HEPARIN SODIUM (PORCINE) 5000 UNIT/ML IJ SOLN
5000.0000 [IU] | Freq: Three times a day (TID) | INTRAMUSCULAR | Status: DC
  Administered 2017-12-24 – 2017-12-25 (×2): 5000 [IU] via SUBCUTANEOUS
  Filled 2017-12-24 (×2): qty 1

## 2017-12-24 MED ORDER — ALBUTEROL SULFATE (2.5 MG/3ML) 0.083% IN NEBU
2.5000 mg | INHALATION_SOLUTION | RESPIRATORY_TRACT | Status: DC | PRN
  Administered 2017-12-25: 2.5 mg via RESPIRATORY_TRACT
  Filled 2017-12-24: qty 3

## 2017-12-24 MED ORDER — LACTATED RINGERS IV BOLUS
3000.0000 mL | Freq: Once | INTRAVENOUS | Status: AC
  Administered 2017-12-24: 3000 mL via INTRAVENOUS

## 2017-12-24 MED ORDER — VALPROATE SODIUM 100 MG/ML IV SOLN: 650.0000 mg | Freq: Three times a day (TID) | INTRAVENOUS | Status: DC

## 2017-12-24 MED ORDER — LEVOTHYROXINE SODIUM 100 MCG IV SOLR: 44.0000 ug | Freq: Every day | INTRAVENOUS | Status: DC

## 2017-12-24 MED ORDER — VASOPRESSIN 20 UNIT/ML IV SOLN
0.0300 [IU]/min | INTRAVENOUS | Status: DC
  Administered 2017-12-24: 0.03 [IU]/min via INTRAVENOUS
  Filled 2017-12-24: qty 2

## 2017-12-24 MED ORDER — VANCOMYCIN HCL IN DEXTROSE 1-5 GM/200ML-% IV SOLN
1000.0000 mg | Freq: Once | INTRAVENOUS | Status: AC
  Administered 2017-12-24: 1000 mg via INTRAVENOUS
  Filled 2017-12-24: qty 200

## 2017-12-24 MED ORDER — DEXTROSE-NACL 5-0.45 % IV SOLN
INTRAVENOUS | Status: DC
  Administered 2017-12-25: 04:00:00 via INTRAVENOUS

## 2017-12-24 MED ORDER — NOREPINEPHRINE 4 MG/250ML-% IV SOLN
0.0000 ug/min | INTRAVENOUS | Status: DC
  Administered 2017-12-24: 2 ug/min via INTRAVENOUS
  Filled 2017-12-24 (×2): qty 250

## 2017-12-24 MED ORDER — SODIUM CHLORIDE 0.9% FLUSH: 10.0000 mL | INTRAVENOUS | Status: DC | PRN

## 2017-12-24 MED ORDER — SODIUM CHLORIDE 0.9 % IV SOLN
INTRAVENOUS | Status: DC
  Administered 2017-12-24: 4.9 [IU]/h via INTRAVENOUS
  Administered 2017-12-25: 12.6 [IU]/h via INTRAVENOUS
  Filled 2017-12-24 (×3): qty 1

## 2017-12-24 MED ORDER — SODIUM CHLORIDE 0.45 % IV SOLN
INTRAVENOUS | Status: DC
  Administered 2017-12-24: via INTRAVENOUS

## 2017-12-24 MED ORDER — PIPERACILLIN-TAZOBACTAM IN DEX 2-0.25 GM/50ML IV SOLN
2.2500 g | Freq: Three times a day (TID) | INTRAVENOUS | Status: DC
  Administered 2017-12-24 – 2017-12-25 (×3): 2.25 g via INTRAVENOUS
  Filled 2017-12-24 (×3): qty 50

## 2017-12-24 MED ORDER — SODIUM CHLORIDE 0.9 % IV SOLN
INTRAVENOUS | Status: DC
  Administered 2017-12-24: 125 mL/h via INTRAVENOUS
  Administered 2017-12-24: 19:00:00 via INTRAVENOUS

## 2017-12-24 NOTE — ED Notes (Signed)
Respiratory at bedside placing pt on bipap

## 2017-12-24 NOTE — ED Notes (Addendum)
IO attempted by MD. Placed in rt lower leg. Fluids hung with pressure bag.

## 2017-12-24 NOTE — Progress Notes (Addendum)
Called to room pt in distress respiratory rate in the 60s hr 160-170s pt unresponsive. Md ordered bipap for pt. Rt suggested intubation since pt cannot protect own airway md doesnot feel that pt has good enough access for intubation at this time. He wanted the patient placed on bipap.Pt will be monitored. Pt placed on bipap 15/5 100% o2.

## 2017-12-24 NOTE — ED Notes (Signed)
bolus applied to pressure bag

## 2017-12-24 NOTE — Progress Notes (Signed)
Pharmacy Antibiotic Note  Megan [name] is a 37 y.o. female admitted on 12/03/2017 with cerebral palsy, spastic quadriparesis, hypertension, epilepsy, diabetes, dysphasia .  Noted some breathing difficulty early in the day which progressed to unresponsiveness.  In the ED she was obtunded, hypotensive and severely hyperglycemic.  Pharmacy has been consulted for vancomycin and zosyn dosing for sepsis.  Scr 4.29, CrCl~ 18mls/min  Plan: -Vancomycin 1gm IV x 1, due to poor renal function will order intermittent vancomycin levels and dose accordingly. - zosyn 2.25gm IV q8h - daily Scr - follow cultures, renal function and clinical course   No data recorded.  Recent Labs  Lab 12/22/2017 1448 12/13/2017 1502  WBC 5.4  --   CREATININE 4.29*  --   LATICACIDVEN  --  15.36*    CrCl cannot be calculated (Unknown ideal weight.).    Allergies  Allergen Reactions  . Doxycycline Rash and Other (See Comments)    REACTION: Blisters / swelling  . Other Other (See Comments)    Seasonal Allergies      Antimicrobials this admission: 8/30 vanc >> 8/30 zosyn >> Dose adjustments this admission:   Microbiology results: 8/30 BCx:  8/30 UCx:    Thank you for allowing pharmacy to be a part of this patient's care.  Dolly Rias RPh 12/16/2017, 6:55 PM Pager [phone]

## 2017-12-24 NOTE — ED Notes (Signed)
EDP at bedside  

## 2017-12-24 NOTE — Progress Notes (Signed)
Pharmacy Note   A consult was received from an ED physician for cefepime and vancomycin per pharmacy dosing.    The patient's profile has been reviewed for ht/wt/allergies/indication/available labs.    A one time order has been placed for cefepime 2 gr IV x1 and vancomycin 1000 mg IV x1 .  Further antibiotics/pharmacy consults should be ordered by admitting physician if indicated.                       Thank you,  Royetta Asal, PharmD, BCPS Pager [phone] 12/16/2017 3:34 PM

## 2017-12-24 NOTE — Progress Notes (Signed)
eLink Physician-Brief Progress Note Patient Name: Megan [name] DOB: 1981/02/20 MRN: [id number]   Date of Service  12/09/2017  HPI/Events of Note  Patient hypotensive  eICU Interventions  Ordered levophed. Also giving LR 3 liter bolus over 2 hours, her BS is still in 500s. She appears very dry.        [name] 12/24/2017, 9:04 PM

## 2017-12-24 NOTE — ED Provider Notes (Signed)
[facility] DEPT Provider Note   CSN: [id number] Arrival date & time: 12/01/2017  1436     History   Chief Complaint Chief Complaint  Patient presents with  . Code Sepsis    HPI Megan [name] is a 37 y.o. female with a history of CP, seizures, COPD, CHF, spastic quadriplegia, PEG tube, and HTN who arrives via EMS from home.  Patient's eyes are open, but she is not responsive to voice or painful stimuli.  EMS reports they were unable to palpate her radial pulses initially, but she had good carotid pulses.  Initial BP 74/52.  CBG reading high.  EMS reports that CBG is readable up to ~800.  On arrival, she is hypertensive, tachycardic, and tachypneic.   Spoke with the patient's legal guardian and POA, [name], who states that the patient is nonverbal at baseline.  She will grunt and groan to communicate.  She states that she was at her normal baseline yesterday.  She reports that she was febrile earlier today, and she called EMS after the patient was noted to be short of breath.  She is also been coughing over the last few days.  She states that the patient is a FULL CODE at this time.  States the patient has a history of prediabetes, but has never been diagnosed with diabetes.  No other history is able to be obtained at this time.  Level V Caveat secondary to mental status change and patient nonverbal at baseline.   The history is provided by the patient. No language interpreter was used.    Past Medical History:  Diagnosis Date  . Allergic rhinitis 11/04/2012  . Cerebral palsy ([place])   . Cerebral palsy, quadriplegic ([place])   . CHF (congestive heart failure) ([place])   . COPD (chronic obstructive pulmonary disease) ([place])   . Cortical blindness   . Dilantin toxicity August 2014  . Hypertension 06/23/2010  . Mental retardation   . Pneumonia December 2014   Was intubated, [facility]  . Seizures ([place])   . Vision abnormalities     Patient Active  Problem List   Diagnosis Date Noted  . Hyperosmolar (nonketotic) coma ([place]) 12/04/2017  . Shock ([place]) 12/22/2017  . Acute respiratory failure ([place]) 11/30/2017  . Acute conjunctivitis of left eye 11/15/2017  . PEG (percutaneous endoscopic gastrostomy) status ([place]) 03/03/2017  . Hospital discharge follow-up 01/22/2017  . Prediabetes 01/22/2017  . Status post insertion of percutaneous endoscopic gastrostomy (PEG) tube ([place])   . Hypoxemia 01/11/2017  . Spastic quadriplegic cerebral palsy ([place])   . Healthcare maintenance 07/08/2016  . CAP (community acquired pneumonia) 12/31/2015  . Spastic quadriparesis ([place]) 02/12/2014  . Dysphagia 02/12/2014  . 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 Cough 01/26/2014  . Localization-related symptomatic epilepsy and epileptic syndromes with complex partial seizures, not intractable, without status epilepticus ([place]) 08/23/2013  . Unspecified constipation 04/18/2013  . Fibroid 04/18/2013  . Abnormal mental state 03/27/2013  . Seizure disorder ([place]) 03/27/2013  . Allergic rhinitis 11/04/2012  . 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 Encounter for long-term (current) use of other medications 10/19/2012  . Severe intellectual disabilities 10/19/2012  . 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 Feeding difficulties and mismanagement 02/19/2012  . Hypothyroidism 06/25/2010  . Hypertension 06/23/2010  . Infantile cerebral palsy ([place]) 02/21/2010  . Blindness of both eyes 02/21/2010    Past Surgical History:  Procedure Laterality Date  . IR GENERIC HISTORICAL  06/30/2016   IR REPLC  GASTRO/COLONIC TUBE PERCUT W/FLUORO 06/30/2016 Marybelle Killings, MD [facility]-INTERV RAD  . IR PATIENT EVAL TECH 0-60 MINS  10/06/2017  . IR US GUIDE VASC ACCESS LEFT  12/01/2017  . PEG PLACEMENT  09/03/2011   Procedure: PERCUTANEOUS ENDOSCOPIC GASTROSTOMY (PEG) REPLACEMENT;  Surgeon: Jerene Bears, MD;  Location: [facility] ENDOSCOPY;  Service: Gastroenterology;   Laterality: N/A;  . PEG PLACEMENT  02/19/2012   Procedure: PERCUTANEOUS ENDOSCOPIC GASTROSTOMY (PEG) REPLACEMENT;  Surgeon: Lafayette Dragon, MD;  Location: [facility] ENDOSCOPY;  Service: Endoscopy;  Laterality: N/A;  needs gastrostomy button 24 Fr 4.4 and 3.5 available  . PEG PLACEMENT N/A 05/30/2013   Procedure: PERCUTANEOUS ENDOSCOPIC GASTROSTOMY (PEG) REPLACEMENT;  Surgeon: Jerene Bears, MD;  Location: [facility];  Service: Gastroenterology;  Laterality: N/A;  . PEG PLACEMENT N/A 07/03/2013   Procedure: PERCUTANEOUS ENDOSCOPIC GASTROSTOMY (PEG) REPLACEMENT;  Surgeon: Lafayette Dragon, MD;  Location: [facility] ENDOS[facility];  Service: Endoscopy;  Laterality: N/A;  Replace with 20 Fr x 3.5 cm mini button-  . PEG TUBE PLACEMENT       OB History   None      Home Medications    Prior to Admission medications   Medication Sig Start Date End Date Taking? Authorizing Provider  dextromethorphan (DELSYM) 30 MG/5ML liquid Place 30 mg into feeding tube as needed for cough.    Yes [provider]  levETIRAcetam (KEPPRA) 100 MG/ML solution GIVE 12.5 MLS IN THE MORNING AND 12.5 ML IN THE EVENING BY PEG TUB Patient taking differently: Place 1,250 mg into feeding tube 2 (two) times daily. GIVE 12.5 MLS IN THE MORNING AND 12.5 ML IN THE EVENING BY PEG TUB 11/18/17  Yes Goodpasture, Otila Kluver, NP  levothyroxine (SYNTHROID, LEVOTHROID) 88 MCG tablet TAKE 1 TABLET (88 MCG TOTAL) PER TUBE DAILY. Patient taking differently: Place 88 mcg into feeding tube daily.  07/12/17  Yes Lockamy, Christia Reading, DO  loratadine (CLARITIN) 5 MG/5ML syrup Place 10 mLs (10 mg total) into feeding tube daily. Patient taking differently: Place 10 mg into feeding tube daily as needed (congestion).  07/02/15  Yes Adamo, Hattie Perch, MD  Nutritional Supplements (FEEDING SUPPLEMENT, JEVITY 1.2 CAL,) LIQD Place 574 mLs into feeding tube See admin instructions. Give 2 cans (574 mls) per tube at 40 ml/hr daily    Yes [provider]  ranitidine (ZANTAC) 75  MG/5ML syrup TAKE 5 MLS (75 MG TOTAL) BY PER J TUBE ROUTE 2 (TWO) TIMES DAILY. Patient taking differently: 75 mg by Per J Tube route 2 (two) times daily.  09/22/17  Yes Lockamy, Timothy, DO  valproic acid (DEPAKENE) 250 MG/5ML SOLN solution GIVE 13 ML BY PEG TUBE EVERY 6 HOURS Patient taking differently: Place 650 mg into feeding tube 3 (three) times daily. GIVE 13 ML BY PEG TUBE EVERY 6 HOURS 11/18/17  Yes Goodpasture, Otila Kluver, NP  VIMPAT 10 MG/ML oral solution TAKE 5MLS EVERY MORNING AND EVENING Patient taking differently: Take 50 mg by mouth 2 (two) times daily. Per J tube 12/17/17  Yes Goodpasture, Otila Kluver, NP  DIASTAT ACUDIAL 10 MG GEL Place 5 mg rectally once. 08/03/16 03/25/17  Rockwell Germany, NP  Incontinence Supplies MISC 1 Units by Does not apply route as needed (For voids or bowel movements). 04/03/16   McKeag, Marylynn Pearson, MD    Family History Family History  Problem Relation Age of Onset  . Diabetes Mother   . Diabetes Maternal Grandmother   . Stroke Maternal Grandmother   . Cancer Maternal Grandfather  Died at 21  . Diabetes Maternal Aunt   . Hypertension Other        Fhx    Social History Social History   Tobacco Use  . Smoking status: Never Smoker  . Smokeless tobacco: Never Used  Substance Use Topics  . Alcohol use: No  . Drug use: No     Allergies   Doxycycline and Other   Review of Systems Review of Systems  Unable to perform ROS: Mental status change  Constitutional: Positive for fever.  Respiratory: Positive for cough and shortness of breath.      Physical Exam Updated Vital Signs BP (!) 76/35   Pulse (!) 114   Temp (!) 96.8 F (36 C)   Resp 16   Wt 40.6 kg   SpO2 (!) 89%   BMI 18.71 kg/m   Physical Exam  Constitutional: She appears ill. She appears distressed.  HENT:  Head: Normocephalic and atraumatic.  Lips appear dry.  Neck: Normal range of motion.  Cardiovascular: Normal heart sounds. Tachycardia present.  Pulses:      Carotid pulses  are 2+ on the right side, and 2+ on the left side.      Femoral pulses are 2+ on the right side, and 2+ on the left side. Bilateral lower extremities are cool to the touch.  No cyanosis noted.  PT pulses are palpable, but decreased bilaterally.  Pulmonary/Chest: Accessory muscle usage present. Tachypnea noted. She is in respiratory distress.  Kussmaul respirations.  Abdominal:  Abdomen is soft, not distended.  PEG tube is in place. No surrounding erythema or warmth.   Musculoskeletal: Normal range of motion.  Bilateral lower extremities are mildly edematous.   Neurological: She is unresponsive.  Eyes are open. Not responsive to voice. Grimaces with IO line placement.   Skin:  Bilateral extremities are cool to the touch.   Nursing note and vitals reviewed.  ED Treatments / Results  Labs (all labs ordered are listed, but only abnormal results are displayed) Labs Reviewed  CBC WITH DIFFERENTIAL/PLATELET - Abnormal; Notable for the following components:      Result Value   RBC 3.32 (*)    Hemoglobin 11.3 (*)    MCV 123.5 (*)    MCHC 27.6 (*)    RDW 18.9 (*)    nRBC 6 (*)    All other components within normal limits  COMPREHENSIVE METABOLIC PANEL - Abnormal; Notable for the following components:   Sodium 164 (*)    Chloride 119 (*)    CO2 18 (*)    Glucose, Bld 1,007 (*)    BUN 74 (*)    Creatinine, Ser 4.29 (*)    Albumin 2.5 (*)    GFR calc non Af Amer 12 (*)    GFR calc Af Amer 14 (*)    Anion gap 27 (*)    All other components within normal limits  LIPASE, BLOOD - Abnormal; Notable for the following components:   Lipase 76 (*)    All other components within normal limits  BLOOD GAS, ARTERIAL - Abnormal; Notable for the following components:   pH, Arterial 7.230 (*)    pO2, Arterial 119 (*)    Bicarbonate 16.7 (*)    Acid-base deficit 9.9 (*)    All other components within normal limits  BLOOD GAS, ARTERIAL - Abnormal; Notable for the following components:   pH,  Arterial 7.278 (*)    pO2, Arterial 402 (*)    Acid-base deficit 5.9 (*)  All other components within normal limits  BASIC METABOLIC PANEL - Abnormal; Notable for the following components:   Sodium 169 (*)    Potassium 2.2 (*)    Chloride >130 (*)    Glucose, Bld 744 (*)    BUN 66 (*)    Creatinine, Ser 4.19 (*)    Calcium 7.8 (*)    GFR calc non Af Amer 12 (*)    GFR calc Af Amer 14 (*)    All other components within normal limits  GLUCOSE, CAPILLARY - Abnormal; Notable for the following components:   Glucose-Capillary 497 (*)    All other components within normal limits  GLUCOSE, CAPILLARY - Abnormal; Notable for the following components:   Glucose-Capillary 511 (*)    All other components within normal limits  CBG MONITORING, ED - Abnormal; Notable for the following components:   Glucose-Capillary >600 (*)    All other components within normal limits  I-STAT CG4 LACTIC ACID, ED - Abnormal; Notable for the following components:   Lactic Acid, Venous 15.36 (*)    All other components within normal limits  I-STAT TROPONIN, ED - Abnormal; Notable for the following components:   Troponin i, poc 0.67 (*)    All other components within normal limits  CBG MONITORING, ED - Abnormal; Notable for the following components:   Glucose-Capillary >600 (*)    All other components within normal limits  CBG MONITORING, ED - Abnormal; Notable for the following components:   Glucose-Capillary 542 (*)    All other components within normal limits  CBG MONITORING, ED - Abnormal; Notable for the following components:   Glucose-Capillary 545 (*)    All other components within normal limits  CULTURE, BLOOD (ROUTINE X 2)  CULTURE, BLOOD (ROUTINE X 2)  URINE CULTURE  BASIC METABOLIC PANEL  BASIC METABOLIC PANEL  BASIC METABOLIC PANEL  CBC  BASIC METABOLIC PANEL  MAGNESIUM  CORTISOL  PROCALCITONIN  TROPONIN I  TROPONIN I  TROPONIN I  KETONES, URINE  I-STAT BETA HCG BLOOD, ED ([facility], [facility], [facility]  ONLY)  I-STAT CG4 LACTIC ACID, ED    EKG EKG Interpretation  Date/Time:  Friday December 24 2017 14:44:36 EDT Ventricular Rate:  164 PR Interval:    QRS Duration: 101 QT Interval:  277 QTC Calculation: 452 R Axis:   -53 Text Interpretation:  Atrial fibrillation Ventricular premature complex Aberrant conduction of SV complex(es) LAD, consider left anterior fascicular block Abnormal R-wave progression, late transition Repol abnrm suggests ischemia, diffuse leads Baseline wander in lead(s) II III aVF Tachycardia  Confirmed by Dene Gentry [phone]) on 12/13/2017 2:47:33 PM   Radiology Ir US Guide Vasc Access Left  Result Date: 12/10/2017 INDICATION: Patient with cerebral palsy presents with hyperglycemia, hypotension and respiratory distress. Patient has intraosseous access but needs emergent central venous access. Interventional Radiology was called to the Emergency Department for emergent vascular access. EXAM: CENTRAL VENOUS ACCESS WITH ULTRASOUND GUIDANCE MEDICATIONS: None ANESTHESIA/SEDATION: None FLUOROSCOPY TIME:  None COMPLICATIONS: None immediate. PROCEDURE: Patient is nonverbal and consent was not obtained due to the emergent situation. Central venous access had already been attempted in the right groin. A catheter was in the right groin but it was thought to be arterial. Ultrasound demonstrated a patent left common femoral vein. The left groin was prepped with chlorhexidine and sterile field was created. Skin was anesthetized with lidocaine. A needle was directed into the left common femoral vein with real-time ultrasound guidance. Wire easily advanced into the central venous system. The tract  was dilated and a triple-lumen catheter was advanced over the wire without complication. All lumens aspirated and flushed well. Catheter was sutured to skin. Dressing was placed over the catheter. FINDINGS: Patent left common femoral vein.  Image was saved for documentation. IMPRESSION: Placement of an  emergent left femoral central venous catheter with ultrasound guidance. Electronically Signed   By: Markus Daft M.D.   On: 12/07/2017 17:55   Dg Chest Portable 1 View  Result Date: 12/23/2017 CLINICAL DATA:  Dyspnea EXAM: PORTABLE CHEST 1 VIEW COMPARISON:  Chest radiograph 01/11/2017 FINDINGS: Persistent mild elevation of the right hemidiaphragm. There is right basilar atelectasis. No pleural effusion or pneumothorax. Slightly increased opacity in the left lower lobe may be scarring related to prior infection. Compared to 01/11/2017, aeration of the left lower lung is improved. IMPRESSION: Bibasilar atelectasis and left basilar scarring, likely related to prior infection. Electronically Signed   By: Ulyses Jarred M.D.   On: 12/14/2017 18:28   [place] Ekg Site Rite  Result Date: 12/05/2017 If Site Rite image not attached, placement could not be confirmed due to current cardiac rhythm.   Procedures IO LINE INSERTION Date/Time: 12/22/2017 6:04 PM Performed by: Joanne Gavel, PA-C Authorized by: Joanne Gavel, PA-C   Consent:    Consent obtained:  Emergent situation Pre-procedure details:    Site preparation:  Chlorhexidine Anesthesia (see MAR for exact dosages):    Anesthesia method:  None Procedure details:    Insertion site:  R proximal tibia   Insertion device:  Drill device   Insertion: Needle was inserted through the bony cortex     Number of attempts:  1   Insertion confirmation:  Aspiration of blood/marrow, easy infusion of fluids and stability of the needle Post-procedure details:    Secured with:  Transparent dressing   Patient tolerance of procedure:  Tolerated well, no immediate complications  .Critical Care Performed by: Joanne Gavel, PA-C Authorized by: Joanne Gavel, PA-C   Critical care provider statement:    Critical care time (minutes):  70   Critical care time was exclusive of:  Separately billable procedures and treating other patients and teaching time    Critical care was necessary to treat or prevent imminent or life-threatening deterioration of the following conditions:  Respiratory failure, sepsis and shock   Critical care was time spent personally by me on the following activities:  Blood draw for specimens, development of treatment plan with patient or surrogate, discussions with consultants, evaluation of patient's response to treatment, examination of patient, obtaining history from patient or surrogate, review of old charts, re-evaluation of patient's condition, pulse oximetry, ordering and review of radiographic studies, ordering and review of laboratory studies, ordering and performing treatments and interventions and vascular access procedures   (including critical care time)  Medications Ordered in ED Medications  dextrose 5 %-0.45 % sodium chloride infusion (has no administration in time range)  insulin regular (NOVOLIN R,HUMULIN R) 100 Units in sodium chloride 0.9 % 100 mL (1 Units/mL) infusion (8.7 Units/hr Intravenous Rate/Dose Change 12/22/2017 2036)  heparin injection 5,000 Units (has no administration in time range)  famotidine (PEPCID) IVPB 20 mg premix (has no administration in time range)  albuterol (PROVENTIL) (2.5 MG/3ML) 0.083% nebulizer solution 2.5 mg (has no administration in time range)  lacosamide (VIMPAT) 50 mg in sodium chloride 0.9 % 25 mL IVPB (has no administration in time range)  levETIRAcetam (KEPPRA) 1,250 mg in sodium chloride 0.9 % 100 mL IVPB (has no administration in  time range)  0.9 %  sodium chloride infusion ( Intravenous Stopped 12/11/2017 2109)  vancomycin variable dose per unstable renal function (pharmacist dosing) (has no administration in time range)  sodium chloride flush (NS) 0.9 % injection 10-40 mL (has no administration in time range)  valproate (DEPACON) 650 mg in dextrose 5 % 50 mL IVPB (has no administration in time range)  levothyroxine (SYNTHROID, LEVOTHROID) injection 44 mcg (has no  administration in time range)  norepinephrine (LEVOPHED) 4mg  in D5W 21mL premix infusion (5 mcg/min Intravenous Rate/Dose Change 11/29/2017 2046)  vancomycin (VANCOCIN) IVPB 1000 mg/200 mL premix (has no administration in time range)  piperacillin-tazobactam (ZOSYN) IVPB 2.25 g (has no administration in time range)  lactated ringers bolus 3,000 mL (3,000 mLs Intravenous New Bag/Given 12/16/2017 2110)  potassium chloride 10 mEq in 50 mL *CENTRAL LINE* IVPB (has no administration in time range)  potassium chloride 20 MEQ/15ML (10%) solution 40 mEq (has no administration in time range)  sodium chloride 0.9 % bolus 1,000 mL (0 mLs Intravenous Stopped 12/15/2017 1607)  sodium chloride 0.9 % bolus 1,000 mL (0 mLs Intravenous Stopped 12/23/2017 2001)     Initial Impression / Assessment and Plan / ED Course  I have reviewed the triage vital signs and the nursing notes.  Pertinent labs & imaging results that were available during my care of the patient were reviewed by me and considered in my medical decision making (see chart for details).    37 year old female with a history of CP, seizures, COPD, CHF, spastic quadriplegia, PEG tube, pre-diabetes, and HTN presenting by EMS from home.  Patient's eyes are open, but she is not responding to voice or painful stimuli.  EMS noted BP to be 74/52.  CBG reading high and right.  She has a history of prediabetes, no formal diagnosis of diabetes mellitus.  Repeat CBG on arrival to the ED is also reading as high. The patient looks clinically dehydrated.  I personally spoke with EMS on arrival and evaluate the patient within 5 minutes of arrival to the ED.  Heart rate was noted to be in the 150s.  SaO2 with good waveform in the high 90s, but the patient was tachypneic with Kussmaul respirations.   The patient was also seen and evaluated by Dr. Francia Greaves, attending physician as the patient was ill appearing and in distress. Given her hypertension, deferred and patient the  patient was placed on BiPAP.  Arterial blood gas with pH of 7.22, bicarb of 16.7, and PO2 of 119.  Lactate of 15.  The patient had an IV in the left [facility] that soon infiltrated.  Ultrasound-guided IV was attempted in the right upper extremity, but a suitable vein was not found.  A right tibial IO was placed.   Dr. Francia Greaves attended right femoral access without success despite using ultrasound.  IJ and subclavian access points were not suitable at this time as the patient was hypotensive and also had multiple episodes of emesis when we attempted to lay her flat.   Spoke with the patient's grandmother, her legal guardian and POA, regarding CODE STATUS.  At that time, she wished for the patient be a FULL CODE.  The severity of the patient's medical condition was discussed at length with the patient's grandmother.  After the conversation, she appeared to have little insight into how critically ill the patient was.  She states that she initially called EMS because the patient was febrile earlier in the day with a cough and dyspnea.  At  this time, suspect DKA.  The patient does have a history of seizures, but family did not note any seizures earlier in the day.  Suspect other etiology for elevated lactate, including sepsis since the patient was febrile with dyspnea.  Zosyn and vancomycin ordered; however  Labs are notable for hypernatremia of 169 and K of 2.2.  Glucose greater than 1000.  Glucose stabilizer ordered.  Spoke with charge nurse regarding having the PICC team, and assessed the patient for access.  In the interim, the patient was also assessed by Dr. Dayna Barker along with Dr. Francia Greaves at shift change.  Arterial access was able to be gained in the right femoral area.  A second IO line in the left tibia was also obtained.  At this time, the patient had a 1 L bolus of fluid.  Charge nurse reported that the PICC team was at [facility] and not available for intervention, but the IR team was willing to come assess  the patient.  Called and updated the patient's legal guardian for a second time.  Discussed that the patient was critically ill and continue to strongly recommend to have family at bedside as the patient's blood pressure had not improved after a liter of fluids.   Spoke with Dr. Lamonte Sakai critical care team, who will admit the patient.  Patient's heart rate has improved from the 150s to the 120s.  Tachypnea has improved on BiPAP.  Since grandmother is now at bedside.   Final Clinical Impressions(s) / ED Diagnoses   Final diagnoses:  Other cerebral palsy [facility])    ED Discharge Orders    None       Joanne Gavel, PA-C 12/23/2017 2200    Valarie Merino, MD January 11, 2018 1505

## 2017-12-24 NOTE — ED Notes (Signed)
Bed: XF58 Expected date:  Expected time:  Means of arrival:  Comments: 37 yo code sepsis

## 2017-12-24 NOTE — ED Notes (Addendum)
EDP attempting second IO left lower leg. Placed by DR Dayna Barker

## 2017-12-24 NOTE — Progress Notes (Signed)
Spoke to RN in ED PICC cannot be placed tonight, patient is unstable BP on 70's/80's. RN will notify MD.

## 2017-12-24 NOTE — Procedures (Signed)
Placement of left femoral central venous line with [place].  Minimal blood loss and no immediate complication.  See full report in IMAGING.

## 2017-12-24 NOTE — ED Notes (Signed)
Date and time results received: 12/18/2017 1542 (use smartphrase ".now" to insert current time)  Test: Na+ 164 Critical Value:Glucose 1007  Name of Provider Notified: Messick   Orders Received? Or Actions Taken?: Actions Taken: Notified Messick and Emily

## 2017-12-24 NOTE — ED Provider Notes (Signed)
Medical screening examination/treatment/procedure(s) were conducted as a shared visit with non-physician practitioner(s) and myself.  I personally evaluated the patient during the encounter.  EKG Interpretation  Date/Time:  Friday December 24 2017 14:44:36 EDT Ventricular Rate:  164 PR Interval:    QRS Duration: 101 QT Interval:  277 QTC Calculation: 452 R Axis:   -53 Text Interpretation:  Atrial fibrillation Ventricular premature complex Aberrant conduction of SV complex(es) LAD, consider left anterior fascicular block Abnormal R-wave progression, late transition Repol abnrm suggests ischemia, diffuse leads Baseline wander in lead(s) II III aVF Tachycardia  Confirmed by Dene Gentry [phone]) on 12/21/2017 2:47:33 PM    Physical Exam  BP (!) 63/28   Pulse (!) 150   Resp (!) 30   SpO2 97%   Physical Exam  Constitutional: She appears lethargic. She appears distressed.  Eyes: Pupils are equal, round, and reactive to light. Conjunctivae are normal.  Neck: No tracheal deviation present.  Neurological: She appears lethargic. She exhibits abnormal muscle tone. Coordination abnormal.  Nursing note and vitals reviewed.   ED Course/Procedures     IO LINE INSERTION Date/Time: 12/07/2017 10:26 PM Performed by: Merrily Pew, MD Authorized by: Merrily Pew, MD   Consent:    Consent obtained:  Emergent situation Pre-procedure details:    Site preparation:  Alcohol   Preparation: Patient was prepped and draped in usual sterile fashion   Anesthesia (see MAR for exact dosages):    Anesthesia method:  None Procedure details:    Insertion site:  L proximal tibia   Insertion: Needle was inserted through the bony cortex     Number of attempts:  1   Insertion confirmation:  Aspiration of blood/marrow, easy infusion of fluids and stability of the needle Post-procedure details:    Secured with:  Protective shield   Patient tolerance of procedure:  Tolerated well, no immediate  complications .Critical Care Performed by: Merrily Pew, MD Authorized by: Merrily Pew, MD   Critical care provider statement:    Critical care time (minutes):  45   Critical care was necessary to treat or prevent imminent or life-threatening deterioration of the following conditions:  Cardiac failure, circulatory failure, dehydration, metabolic crisis, shock and respiratory failure   Critical care was time spent personally by me on the following activities:  Discussions with consultants, evaluation of patient's response to treatment, examination of patient, ordering and performing treatments and interventions, ordering and review of laboratory studies, ordering and review of radiographic studies, pulse oximetry, re-evaluation of patient's condition, obtaining history from patient or surrogate and review of old charts   I assumed direction of critical care for this patient from another provider in my specialty: yes   .Central Line Date/Time: 12/23/2017 10:27 PM Performed by: Merrily Pew, MD Authorized by: Merrily Pew, MD   Consent:    Consent obtained:  Emergent situation Pre-procedure details:    Hand hygiene: Hand hygiene performed prior to insertion     Skin preparation:  2% chlorhexidine   Skin preparation agent: Skin preparation agent completely dried prior to procedure   Procedure details:    Location:  R femoral   Site selection rationale:  Not tolerating lying flat, respiratory distress making subclavian not ideal   Procedural supplies:  Triple lumen   Catheter size:  7.5 Fr   Ultrasound guidance: yes     Sterile ultrasound techniques: Sterile gel and sterile probe covers were used     Number of attempts:  2   Successful placement: no (suspect likely arterial  placement)   Post-procedure details:    Post-procedure:  Dressing applied   Assessment:  Blood return through all ports and free fluid flow   Patient tolerance of procedure:  Tolerated well, no immediate  complications Comments:     Suspect likely arterial placement however the patient was in dire need of any type of line in order to volume resuscitate and will attempt another line later.    MDM   I arrived when other providers were still working on getting a line.  Patient was hypotensive, tachypneic and tachycardic on BiPAP.  Lethargic borderline unresponsive.  At that time the patient had an IO line.  Difficulty visualizing any peripheral venous structures on ultrasound so another IO was placed by myself in the left proximal tibia.  Fluids did not run very quickly through the I/O's so we still were attempted to get another line.  Patient was still hypotensive in the 60s over 30s.  Attempted to place a right femoral venous central line however the blood the fluid out was quite brisk although not pulsatile.  Decision was made to go ahead and place Even if it was arterial in order to volume resuscitate the patient.  Subsequently IR was able to place a left femoral venous line. CCM consulted for admission.        Merrily Pew, MD 11/28/2017 2236

## 2017-12-24 NOTE — Progress Notes (Signed)
Patient ID: Megan [name], female   DOB: 05/27/1980, 37 y.o.   MRN: [id number] IR was called to urgently evaluate patient in the ED for central venous access.  Patient is non-verbal and hyperglycemic and in respiratory distress.  Intra-osseous access obtained and ED staff attempted central venous access in right groin but it was thought to be arterial.  I evaluated the left groin with [place] and found a patent left common femoral vein.    Left groin was prepped with Chlorhexidine and sterile field created with sterile towels.  Local anesthetic was used.  Left common femoral vein was punctured with US guidance.  Wire advanced easily and triple lumen catheter placed without difficulty.  Lumens aspirated and flushed well.  Catheter secured to skin.  Dressing placed.  Central line is ready for use.

## 2017-12-24 NOTE — ED Notes (Signed)
ED provider Messick made aware patient has critical lactic value of 15.36

## 2017-12-24 NOTE — ED Notes (Signed)
EDP attempting to place central line at this time.

## 2017-12-24 NOTE — ED Triage Notes (Signed)
Patient BIB [facility] from home. Pt unresponsive, eyes are open but pt is not responding to voice or pain. Pt normally alert x2 at baseline. Family reports pt had some difficulty breathing earlier, put her to bed and pt stopped responding. CBG reading HIGH. Pt has G-tube in place. Initial bp 74/52

## 2017-12-24 NOTE — ED Notes (Signed)
ED provider Messick made aware patient has critical Troponin value of 0.67

## 2017-12-24 NOTE — H&P (Signed)
Megan [name]  CXK:[id number] DOB: February 24, 1981 DOA: 12/09/2017 PCP: Nuala Alpha, DO    LOS: 0 days   Reason for Consult / Chief Complaint:  Acute respiratory failure, shock, metabolic disarray  Consulting MD and date:  Dr. Francia Greaves, EDP  HPI/Summary of hospital stay:  37 year old woman with cerebral palsy, spastic quadriparesis, hypertension, epilepsy, diabetes, dysphasia that has necessitated PEG tube placement.  She is nonverbal, only intermittently responsive even at her baseline but usually is awake and alert.  Per ER notes she had some breathing difficulty early in the day 8/30, this progressed to unresponsiveness and prompted transport to the ED. No witnessed seizures at home or in the ED.  In the ED on presentation she was obtunded, hypotensive, severely hyperglycemic.  She developed tachypnea and respiratory distress, was placed on BiPAP.  ABG on BiPAP consistent with a severe metabolic acidosis, lactate 15.36. No clear source infection at least on presentation. Na 161, glucose 1000, acute renal failure S Cr 4.29. CXR still pending.   Called to assist with stabilization, admission and further care  Subjective:  Patient is unable to give any history Currently on BiPAP Labile hemodynamics, attempting to get adequate IV access currently.   Objective   Blood pressure (!) 80/53, pulse (!) 136, resp. rate (!) 27, SpO2 100 %.    Vent Mode: BIPAP FiO2 (%):  [40 %] 40 % Set Rate:  [15 bmp] 15 bmp PEEP:  [5 cmH20] 5 cmH20 Plateau Pressure:  [9 cmH20] 9 cmH20  No intake or output data in the 24 hours ending 12/15/2017 1816 There were no vitals filed for this visit.  Examination: General: young debilitated woman, ill but appears stable on BiPAP HENT: OP dry, no stridor, no significant oral secretions.  Lungs: decreased at bases but clear Cardiovascular: distant, tachy, no M Abdomen: soft, NT, hypoactive BS, PEG tube Extremities: no edema Neuro: turning her head  spontaneously, will not interact GU: foley in place  Consults: date of consult/date signed off & final recs:   IR 8/30 placed L femoral CVC  Procedures: L femoral CVC 8/30 >>  R femoral line ? Arterial vs CVC 8/30 >>   Significant Diagnostic Tests:   Micro Data: Blood 8/30 >>  Urine 8/30 >>   Antimicrobials:  vanco 8/30 >>  Zosyn 8/30 >>     Resolved Hospital Problem list    Assessment & Plan:   Acute respiratory failure, compensation for metabolic acidosis and also some degree of inadequate airway protection (acute on chronic) Currently tolerating BiPAP but at high risk for intubation.  I would like to try to avoid if possible.  Her airway protection and her compensation for severe metabolic acidosis will be the determining factors. Follow ABG Chest x-ray ordered and pending Swallow precautions  Shock, hypovolemic plus possible septic Baseline HTN Aggressive IV fluids Left femoral central IV access has been obtained, could initiate pressors if necessary Right femoral line placed urgently may actually be arterial.  We will need to transduce from it, determine whether it is in the artery, remove if so  Acute renal failure Severe anion gap metabolic acidosis > lactic acidosis plus component hyperosmolar/DKA. Etiology lactate unclear, ? Occult ischemia, ? seizure Aggressive IV fluid resuscitation initiated in the emergency department Insulin drip initiated Check ketones  Chronic dysphagia, chronic PEG Free water per tube if no evidence for ileus, obstruction.  Otherwise defer any enteral feeds, meds for now.  Anemia chronic disease Follow CBC   Possible septic shock,  no clear infectious etiology identified at this time.  Normal WBC cx data obtained Empiric zosyn and vanco for now, tailor based on cx data and CXR Check Pct   Hyperosmolar nonketotic coma versus DKA Diabetes Hypothyroidism Convert home Synthroid to 44 mcg IV  Toxic metabolic  encephalopathy Cerebral palsy with baseline cognitive deficits, spastic quadriplegia Seizure disorder Follow mental status for improvement with volume resuscitation Need to speak with family regarding her baseline mental status, functional capacity Continue Keppra, Depakene, Vimpat and convert to IV    Disposition / Summary of Today's Plan 12/17/2017     Best Practice / Goals of Care / Disposition.   DVT prophylaxis: heparin sq GI prophylaxis: pepcid Diet: npo Mobility:bed rest Code Status: DNR Family Communication: Patient's status, nature of her illness discussed with her grandmother and healthcare power of attorney at bedside on 8/30.  She understands that the patient is critically ill.  She wants all possible medical interventions to be performed, supports procedures if they can potentially be beneficial including line placement, BiPAP.  She does not want her care to be escalated to mechanical ventilation, would not want CPR.  I have supported this decision, agree with this plan.  I will place DNR orders on the chart.  I did discuss pressors with her.  These would be acceptable  Labs   CBC: Recent Labs  Lab 11/26/2017 1448  WBC 5.4  NEUTROABS 1.7  HGB 11.3*  HCT 41.0  MCV 123.5*  PLT 836   Basic Metabolic Panel: Recent Labs  Lab 12/07/2017 1448  NA 164*  K 3.8  CL 119*  CO2 18*  GLUCOSE 1,007*  BUN 74*  CREATININE 4.29*  CALCIUM 9.2   GFR: CrCl cannot be calculated (Unknown ideal weight.). Recent Labs  Lab 12/16/2017 1448 11/28/2017 1502  WBC 5.4  --   LATICACIDVEN  --  15.36*   Liver Function Tests: Recent Labs  Lab 12/03/2017 1448  AST 33  ALT 12  ALKPHOS 112  BILITOT 0.4  PROT 8.1  ALBUMIN 2.5*   Recent Labs  Lab 12/01/2017 1448  LIPASE 76*   No results for input(s): AMMONIA in the last 168 hours. ABG    Component Value Date/Time   PHART 7.278 (L) 11/26/2017 1738   PCO2ART 44.5 12/14/2017 1738   PO2ART 402 (H) 12/02/2017 1738   HCO3 20.1 12/16/2017  1738   TCO2 26 02/23/2016 0022   ACIDBASEDEF 5.9 (H) 12/20/2017 1738   O2SAT 99.7 12/10/2017 1738    Coagulation Profile: No results for input(s): INR, PROTIME in the last 168 hours. Cardiac Enzymes: No results for input(s): CKTOTAL, CKMB, CKMBINDEX, TROPONINI in the last 168 hours. HbA1C: Hgb A1c MFr Bld  Date/Time Value Ref Range Status  01/15/2017 05:52 AM 6.4 (H) 4.8 - 5.6 % Final    Comment:    (NOTE)         Prediabetes: 5.7 - 6.4         Diabetes: >6.4         Glycemic control for adults with diabetes: <7.0   01/01/2016 06:35 AM 5.5 4.8 - 5.6 % Final    Comment:    (NOTE)         Pre-diabetes: 5.7 - 6.4         Diabetes: >6.4         Glycemic control for adults with diabetes: <7.0    CBG: Recent Labs  Lab 12/05/2017 1443 12/16/2017 1725 12/24/17 1812  GLUCAP >600* >600* 542*  Review of Systems:    Past medical history  She,  has a past medical history of Allergic rhinitis (11/04/2012), Cerebral palsy ([place]), Cerebral palsy, quadriplegic ([place]), CHF (congestive heart failure) ([place]), COPD (chronic obstructive pulmonary disease) ([place]), Cortical blindness, Dilantin toxicity (August 2014), Hypertension (06/23/2010), Mental retardation, Pneumonia (December 2014), Seizures [facility]), and Vision abnormalities.   Surgical History    Past Surgical History:  Procedure Laterality Date  . IR GENERIC HISTORICAL  06/30/2016   IR REPLC GASTRO/COLONIC TUBE PERCUT W/FLUORO 06/30/2016 Marybelle Killings, MD [facility]-INTERV RAD  . IR PATIENT EVAL TECH 0-60 MINS  10/06/2017  . IR US GUIDE VASC ACCESS LEFT  12/02/2017  . PEG PLACEMENT  09/03/2011   Procedure: PERCUTANEOUS ENDOSCOPIC GASTROSTOMY (PEG) REPLACEMENT;  Surgeon: Jerene Bears, MD;  Location: [facility] ENDOSCOPY;  Service: Gastroenterology;  Laterality: N/A;  . PEG PLACEMENT  02/19/2012   Procedure: PERCUTANEOUS ENDOSCOPIC GASTROSTOMY (PEG) REPLACEMENT;  Surgeon: Lafayette Dragon, MD;  Location: [facility] ENDOS[facility];  Service: Endoscopy;  Laterality: N/A;  needs  gastrostomy button 24 Fr 4.4 and 3.5 available  . PEG PLACEMENT N/A 05/30/2013   Procedure: PERCUTANEOUS ENDOSCOPIC GASTROSTOMY (PEG) REPLACEMENT;  Surgeon: Jerene Bears, MD;  Location: [place];  Service: Gastroenterology;  Laterality: N/A;  . PEG PLACEMENT N/A 07/03/2013   Procedure: PERCUTANEOUS ENDOSCOPIC GASTROSTOMY (PEG) REPLACEMENT;  Surgeon: Lafayette Dragon, MD;  Location: [facility] ENDOS[facility];  Service: Endoscopy;  Laterality: N/A;  Replace with 20 Fr x 3.5 cm mini button-  . PEG TUBE PLACEMENT       Social History   Social History   Socioeconomic History  . Marital status: Single    Spouse name: Not on file  . Number of children: Not on file  . Years of education: Not on file  . Highest education level: Not on file  Occupational History  . Not on file  Social Needs  . Financial resource strain: Not on file  . Food insecurity:    Worry: Not on file    Inability: Not on file  . Transportation needs:    Medical: Not on file    Non-medical: Not on file  Tobacco Use  . Smoking status: Never Smoker  . Smokeless tobacco: Never Used  Substance and Sexual Activity  . Alcohol use: No  . Drug use: No  . Sexual activity: Never  Lifestyle  . Physical activity:    Days per week: Not on file    Minutes per session: Not on file  . Stress: Not on file  Relationships  . Social connections:    Talks on phone: Not on file    Gets together: Not on file    Attends religious service: Not on file    Active member of club or organization: Not on file    Attends meetings of clubs or organizations: Not on file    Relationship status: Not on file  . Intimate partner violence:    Fear of current or ex partner: Not on file    Emotionally abused: Not on file    Physically abused: Not on file    Forced sexual activity: Not on file  Other Topics Concern  . Not on file  Social History Narrative   [name] attends [employer] and does well. She lives with her grandmother/guardian, [name],  her cousin, and her great aunt. She enjoys exercising, standing and music.  ,  reports that she has never smoked. She has never used smokeless tobacco. She reports that she does not  drink alcohol or use drugs.   Family history   Her family history includes Cancer in her maternal grandfather; Diabetes in her maternal aunt, maternal grandmother, and mother; Hypertension in her other; Stroke in her maternal grandmother.   Allergies Allergies  Allergen Reactions  . Doxycycline Rash and Other (See Comments)    REACTION: Blisters / swelling  . Other Other (See Comments)    Seasonal Allergies      Home meds  Prior to Admission medications   Medication Sig Start Date End Date Taking? Authorizing Provider  dextromethorphan (DELSYM) 30 MG/5ML liquid Take 30 mg by mouth as needed for cough.    [provider]  DIASTAT ACUDIAL 10 MG GEL Place 5 mg rectally once. 08/03/16 03/25/17  Rockwell Germany, NP  Incontinence Supplies MISC 1 Units by Does not apply route as needed (For voids or bowel movements). 04/03/16   McKeag, Marylynn Pearson, MD  levETIRAcetam (KEPPRA) 100 MG/ML solution GIVE 12.5 MLS IN THE MORNING AND 12.5 ML IN THE EVENING BY PEG TUB 11/18/17   Rockwell Germany, NP  levothyroxine (SYNTHROID, LEVOTHROID) 88 MCG tablet TAKE 1 TABLET (88 MCG TOTAL) PER TUBE DAILY. 07/12/17   Nuala Alpha, DO  loratadine (CLARITIN) 5 MG/5ML syrup Place 10 mLs (10 mg total) into feeding tube daily. Patient taking differently: Place 10 mg into feeding tube daily as needed (congestion).  07/02/15   Frazier Richards, MD  Nutritional Supplements (FEEDING SUPPLEMENT, JEVITY 1.2 CAL,) LIQD Place 574 mLs into feeding tube See admin instructions. Give 2 cans (574 mls) per tube at 40 ml/hr daily    [provider]  ranitidine (ZANTAC) 75 MG/5ML syrup TAKE 5 MLS (75 MG TOTAL) BY PER J TUBE ROUTE 2 (TWO) TIMES DAILY. 09/22/17   Nuala Alpha, DO  valproic acid (DEPAKENE) 250 MG/5ML SOLN solution GIVE 13 ML BY PEG  TUBE EVERY 6 HOURS 11/18/17   Rockwell Germany, NP  VIMPAT 10 MG/ML oral solution TAKE 5MLS EVERY MORNING AND EVENING 12/17/17   Rockwell Germany, NP     Independent CC time 90 minutes  Baltazar Apo, MD, PhD 12/17/2017, 6:16 PM [facility] [phone] or if no answer [phone]

## 2017-12-25 ENCOUNTER — Inpatient Hospital Stay (HOSPITAL_COMMUNITY): Payer: Medicaid Other

## 2017-12-25 DIAGNOSIS — G40309 Generalized idiopathic epilepsy and epileptic syndromes, not intractable, without status epilepticus: Secondary | ICD-10-CM

## 2017-12-25 LAB — CBC
HEMATOCRIT: 26.5 % — AB (ref 36.0–46.0)
Hemoglobin: 7.9 g/dL — ABNORMAL LOW (ref 12.0–15.0)
MCH: 34.8 pg — ABNORMAL HIGH (ref 26.0–34.0)
MCHC: 29.8 g/dL — ABNORMAL LOW (ref 30.0–36.0)
MCV: 116.7 fL — AB (ref 78.0–100.0)
Platelets: 137 10*3/uL — ABNORMAL LOW (ref 150–400)
RBC: 2.27 MIL/uL — AB (ref 3.87–5.11)
RDW: 18.5 % — AB (ref 11.5–15.5)
WBC: 5 10*3/uL (ref 4.0–10.5)

## 2017-12-25 LAB — BASIC METABOLIC PANEL
ANION GAP: 11 (ref 5–15)
Anion gap: 11 (ref 5–15)
Anion gap: 8 (ref 5–15)
BUN: 47 mg/dL — AB (ref 6–20)
BUN: 45 mg/dL — ABNORMAL HIGH (ref 6–20)
BUN: 45 mg/dL — ABNORMAL HIGH (ref 6–20)
CALCIUM: 7.4 mg/dL — AB (ref 8.9–10.3)
CHLORIDE: 129 mmol/L — AB (ref 98–111)
CHLORIDE: 128 mmol/L — AB (ref 98–111)
CO2: 20 mmol/L — ABNORMAL LOW (ref 22–32)
CO2: 21 mmol/L — ABNORMAL LOW (ref 22–32)
CO2: 23 mmol/L (ref 22–32)
CREATININE: 2.64 mg/dL — AB (ref 0.44–1.00)
Calcium: 7.5 mg/dL — ABNORMAL LOW (ref 8.9–10.3)
Calcium: 7.6 mg/dL — ABNORMAL LOW (ref 8.9–10.3)
Chloride: 125 mmol/L — ABNORMAL HIGH (ref 98–111)
Creatinine, Ser: 2.69 mg/dL — ABNORMAL HIGH (ref 0.44–1.00)
Creatinine, Ser: 2.68 mg/dL — ABNORMAL HIGH (ref 0.44–1.00)
GFR calc Af Amer: 25 mL/min — ABNORMAL LOW (ref >60)
GFR calc Af Amer: 25 mL/min — ABNORMAL LOW (ref >60)
GFR calc non Af Amer: 21 mL/min — ABNORMAL LOW (ref >60)
GFR calc non Af Amer: 22 mL/min — ABNORMAL LOW (ref >60)
GFR, EST AFRICAN AMERICAN: 25 mL/min — AB (ref >60)
GFR, EST NON AFRICAN AMERICAN: 21 mL/min — AB (ref >60)
GLUCOSE: 299 mg/dL — AB (ref 70–99)
Glucose, Bld: 200 mg/dL — ABNORMAL HIGH (ref 70–99)
Glucose, Bld: 196 mg/dL — ABNORMAL HIGH (ref 70–99)
POTASSIUM: 5.1 mmol/L (ref 3.5–5.1)
POTASSIUM: 5.2 mmol/L — AB (ref 3.5–5.1)
Potassium: 5 mmol/L (ref 3.5–5.1)
SODIUM: 160 mmol/L — AB (ref 135–145)
Sodium: 160 mmol/L — ABNORMAL HIGH (ref 135–145)
Sodium: 156 mmol/L — ABNORMAL HIGH (ref 135–145)

## 2017-12-25 LAB — GLUCOSE, CAPILLARY
GLUCOSE-CAPILLARY: 154 mg/dL — AB (ref 70–99)
GLUCOSE-CAPILLARY: 137 mg/dL — AB (ref 70–99)
GLUCOSE-CAPILLARY: 119 mg/dL — AB (ref 70–99)
GLUCOSE-CAPILLARY: 99 mg/dL (ref 70–99)
GLUCOSE-CAPILLARY: 105 mg/dL — AB (ref 70–99)
GLUCOSE-CAPILLARY: 95 mg/dL (ref 70–99)
Glucose-Capillary: 192 mg/dL — ABNORMAL HIGH (ref 70–99)
Glucose-Capillary: 270 mg/dL — ABNORMAL HIGH (ref 70–99)
Glucose-Capillary: 324 mg/dL — ABNORMAL HIGH (ref 70–99)

## 2017-12-25 LAB — CORTISOL: Cortisol, Plasma: 25.2 ug/dL

## 2017-12-25 LAB — MRSA PCR SCREENING: MRSA by PCR: NEGATIVE

## 2017-12-25 LAB — TROPONIN I
TROPONIN I: 0.24 ng/mL — AB (ref <0.03)
TROPONIN I: 0.16 ng/mL — AB (ref <0.03)

## 2017-12-25 LAB — MAGNESIUM: Magnesium: 2 mg/dL (ref 1.7–2.4)

## 2017-12-25 LAB — LACTIC ACID, PLASMA: Lactic Acid, Venous: 8.3 mmol/L (ref 0.5–1.9)

## 2017-12-25 MED ORDER — ONDANSETRON HCL 4 MG/2ML IJ SOLN
INTRAMUSCULAR | Status: AC
  Filled 2017-12-25: qty 2

## 2017-12-25 MED ORDER — MORPHINE BOLUS VIA INFUSION
5.0000 mg | INTRAVENOUS | Status: DC | PRN
  Filled 2017-12-25: qty 20

## 2017-12-25 MED ORDER — ACETYLCYSTEINE 20 % IN SOLN
3.0000 mL | Freq: Three times a day (TID) | RESPIRATORY_TRACT | Status: DC
  Administered 2017-12-25: 03:00:00 via RESPIRATORY_TRACT
  Filled 2017-12-25: qty 4

## 2017-12-25 MED ORDER — LACTATED RINGERS IV BOLUS
1000.0000 mL | Freq: Once | INTRAVENOUS | Status: AC
  Administered 2017-12-25: 1000 mL via INTRAVENOUS

## 2017-12-25 MED ORDER — ORAL CARE MOUTH RINSE
15.0000 mL | Freq: Two times a day (BID) | OROMUCOSAL | Status: DC
  Administered 2017-12-25: 15 mL via OROMUCOSAL

## 2017-12-25 MED ORDER — MORPHINE 100MG IN NS 100ML (1MG/ML) PREMIX INFUSION
1.0000 mg/h | INTRAVENOUS | Status: DC
  Administered 2017-12-25: 2 mg/h via INTRAVENOUS
  Filled 2017-12-25: qty 100

## 2017-12-25 MED ORDER — NOREPINEPHRINE 16 MG/250ML-% IV SOLN
0.0000 ug/min | INTRAVENOUS | Status: DC
  Administered 2017-12-25: 20 ug/min via INTRAVENOUS
  Administered 2017-12-25: 40 ug/min via INTRAVENOUS
  Filled 2017-12-25 (×2): qty 250

## 2017-12-25 MED ORDER — ONDANSETRON HCL 4 MG/2ML IJ SOLN
4.0000 mg | Freq: Four times a day (QID) | INTRAMUSCULAR | Status: DC | PRN
  Administered 2017-12-25: 4 mg via INTRAVENOUS

## 2017-12-25 MED ORDER — POTASSIUM CHLORIDE 20 MEQ/15ML (10%) PO SOLN
40.0000 meq | Freq: Once | ORAL | Status: AC
  Administered 2017-12-25: 40 meq via ORAL
  Filled 2017-12-25: qty 30

## 2017-12-25 MED ORDER — SODIUM CHLORIDE 0.45 % IV SOLN
INTRAVENOUS | Status: DC
  Administered 2017-12-25: 03:00:00 via INTRAVENOUS

## 2017-12-25 MED ORDER — SODIUM CHLORIDE 0.45 % IV SOLN
INTRAVENOUS | Status: DC
  Administered 2017-12-25: 10:00:00 via INTRAVENOUS

## 2017-12-25 MED ORDER — CHLORHEXIDINE GLUCONATE 0.12 % MT SOLN
15.0000 mL | Freq: Two times a day (BID) | OROMUCOSAL | Status: DC
  Administered 2017-12-25: 15 mL via OROMUCOSAL
  Filled 2017-12-25: qty 15

## 2017-12-25 MED ORDER — INSULIN ASPART 100 UNIT/ML ~~LOC~~ SOLN: 3.0000 [IU] | SUBCUTANEOUS | Status: DC

## 2017-12-25 MED ORDER — FREE WATER: 400.0000 mL | Freq: Four times a day (QID) | Status: DC

## 2017-12-25 MED ORDER — SODIUM CHLORIDE 3 % IN NEBU
4.0000 mL | INHALATION_SOLUTION | Freq: Four times a day (QID) | RESPIRATORY_TRACT | Status: DC
  Administered 2017-12-25 (×2): 4 mL via RESPIRATORY_TRACT
  Filled 2017-12-25 (×3): qty 4

## 2017-12-26 LAB — BLOOD CULTURE ID PANEL (REFLEXED)
Acinetobacter baumannii: NOT DETECTED
Acinetobacter baumannii: NOT DETECTED
CANDIDA ALBICANS: NOT DETECTED
CANDIDA GLABRATA: NOT DETECTED
CANDIDA PARAPSILOSIS: NOT DETECTED
CANDIDA TROPICALIS: NOT DETECTED
Candida albicans: NOT DETECTED
Candida glabrata: NOT DETECTED
Candida krusei: NOT DETECTED
Candida krusei: NOT DETECTED
Candida parapsilosis: NOT DETECTED
Candida tropicalis: NOT DETECTED
Carbapenem resistance: NOT DETECTED
ENTEROBACTER CLOACAE COMPLEX: NOT DETECTED
ENTEROBACTERIACEAE SPECIES: NOT DETECTED
ESCHERICHIA COLI: NOT DETECTED
Enterobacter cloacae complex: NOT DETECTED
Enterobacteriaceae species: DETECTED — AB
Enterococcus species: NOT DETECTED
Enterococcus species: NOT DETECTED
Escherichia coli: DETECTED — AB
HAEMOPHILUS INFLUENZAE: NOT DETECTED
Haemophilus influenzae: NOT DETECTED
KLEBSIELLA OXYTOCA: NOT DETECTED
KLEBSIELLA PNEUMONIAE: NOT DETECTED
Klebsiella oxytoca: NOT DETECTED
Klebsiella pneumoniae: NOT DETECTED
Listeria monocytogenes: NOT DETECTED
Listeria monocytogenes: NOT DETECTED
Methicillin resistance: DETECTED — AB
Neisseria meningitidis: NOT DETECTED
Neisseria meningitidis: NOT DETECTED
PSEUDOMONAS AERUGINOSA: NOT DETECTED
Proteus species: NOT DETECTED
Proteus species: NOT DETECTED
Pseudomonas aeruginosa: NOT DETECTED
SERRATIA MARCESCENS: NOT DETECTED
STAPHYLOCOCCUS AUREUS BCID: NOT DETECTED
STAPHYLOCOCCUS SPECIES: NOT DETECTED
STREPTOCOCCUS AGALACTIAE: NOT DETECTED
STREPTOCOCCUS PNEUMONIAE: NOT DETECTED
STREPTOCOCCUS PYOGENES: NOT DETECTED
STREPTOCOCCUS SPECIES: NOT DETECTED
Serratia marcescens: NOT DETECTED
Staphylococcus aureus (BCID): NOT DETECTED
Staphylococcus species: DETECTED — AB
Streptococcus agalactiae: NOT DETECTED
Streptococcus pneumoniae: NOT DETECTED
Streptococcus pyogenes: NOT DETECTED
Streptococcus species: NOT DETECTED

## 2017-12-26 LAB — URINE CULTURE: CULTURE: NO GROWTH

## 2017-12-26 NOTE — Progress Notes (Signed)
Dr. Gladys Damme notified of lab being unable to obtain second set of blood cultures peripherally.  Two lab techs attempted.  Ok to draw blood culture set #2 off of the left fem central line placed today.

## 2017-12-26 NOTE — Progress Notes (Signed)
Right femoral central line pulled.  Pressure held for 30 minutes.  Site soft, no hematoma, no bleeding.  1 + pedal pulse.

## 2017-12-26 NOTE — Progress Notes (Signed)
Dr. Gladys Damme aware of labs, pts BP, orders received.

## 2017-12-26 NOTE — Progress Notes (Signed)
Dr. Gladys Damme aware of critical labs.  Orders received

## 2017-12-26 NOTE — Progress Notes (Signed)
Pt's grandmother [name]) and caregiver was bedside when I arrived. She was holding pt's hand. She said she has had pt since she was 3 months as pt's mother was not able to take care of her. She talked about how pt was not expected to make it this long, but each time she did. Pt's grandmother seems to be appropriately accepting that this time may be different. She wants pt to have a peaceful and quiet transition if this her time and therefore has not called a lot of family members. Grandmother is composed and accepting. She has a strong faith background and community. I offered pastoral support and presence.  Pt's mother and cousin arrived w/food and I left to allow them to enjoy their meal. Please page regarding any changes. [place], [place]   2018/01/04 1300  Clinical Encounter Type  Visited With Family

## 2017-12-26 NOTE — Progress Notes (Signed)
Megan [name]  ZOX:[id number] DOB: May 25, 1980 DOA: 12/07/2017 PCP: Nuala Alpha, DO    LOS: 1 day   Reason for Consult / Chief Complaint:  Acute respiratory failure, shock, metabolic disarray  Consulting MD and date:  Dr. Francia Greaves, EDP  HPI/Summary of hospital stay:  37 year old woman with cerebral palsy, spastic quadriparesis, hypertension, epilepsy, diabetes, dysphasia that has necessitated PEG tube placement.  She is nonverbal, only intermittently responsive even at her baseline but usually is awake and alert.  Per ER notes she had some breathing difficulty early in the day 8/30, this progressed to unresponsiveness and prompted transport to the ED. No witnessed seizures at home or in the ED.  In the ED on presentation she was obtunded, hypotensive, severely hyperglycemic.  She developed tachypnea and respiratory distress, was placed on BiPAP.  ABG on BiPAP consistent with a severe metabolic acidosis, lactate 15.36. No clear source infection at least on presentation. Na 161, glucose 1000, acute renal failure S Cr 4.29.   Subjective/interval events:  Pressors initiated and titrated up overnight Has stayed on BiPAP, difficulty with secretion clearance, atelectasis versus evolving infiltrate, possible mucous plugging Note some improvement lactate, renal function  Objective   Blood pressure (!) 84/42, pulse (!) 133, temperature (!) 101.8 F (38.8 C), resp. rate (!) 38, height 5\' 2"  (1.575 m), weight 40.6 kg, SpO2 (!) 87 %. CVP:  [9 mmHg-59 mmHg] 12 mmHg  Vent Mode: BIPAP FiO2 (%):  [40 %-100 %] 100 % Set Rate:  [15 bmp] 15 bmp PEEP:  [5 WJX91-4 cmH20] 8 cmH20 Plateau Pressure:  [9 cmH20] 9 cmH20   Intake/Output Summary (Last 24 hours) at 01/06/2018 7829 Last data filed at 01/06/2018 0800 Gross per 24 hour  Intake 1555 ml  Output 1525 ml  Net 30 ml   Filed Weights   12/18/2017 1945  Weight: 40.6 kg    Examination: General: Debilitated woman, on BiPAP HENT: Difficult to  examine with BiPAP in place, no significant oral secretions noted Lungs: Very small breaths, decreased at both bases with crackles present Cardiovascular: Tachycardic, regular, no murmur Abdomen: Soft, nontender, PEG tube noted Extremities: No significant edema Neuro: Poorly responsive, will grimace with stimulation GU: Foley in place  Consults: date of consult/date signed off & final recs:   IR 8/30 placed L femoral CVC  Procedures: L femoral CVC 8/30 >>  R femoral Arterial 8/30 >> 8/30  Significant Diagnostic Tests:   Micro Data: Blood 8/30 >>  Urine 8/30 >>   Antimicrobials:  vanco 8/30 >>  Zosyn 8/30 >>     Resolved Hospital Problem list    Assessment & Plan:   Acute respiratory failure, compensation for metabolic acidosis and also some degree of inadequate airway protection (acute on chronic) Left lower lobe PNA and probable right basilar pneumonia (versus atelectasis) Suspected mucous plugging, difficulty mobilizing secretions.  Continue BiPAP for now, breaks if possible.  We clarified that she would not be intubated if she were to worsen.  Clearly she is at high risk to progress, prognosis for recovery given her current debilitation is poor.  I related this to her grandmother and cousin at bedside 8/31 Push pulmonary hygiene, 3% saline nebs added for secretion clearance. Follow chest x-ray and ABG  Shock, hypovolemic plus septic Baseline HTN Continue current IV fluids Pressors initiated, will try to wean if possible Right femoral line removed, was malpositioned, arterial.  No obvious complications from this  Acute renal failure, improving Severe anion gap metabolic acidosis >  lactic acidosis plus component hyperosmolar/DKA. Etiology lactate > suspect sepsis, ? seizure Aggressive IV fluid resuscitation initiated in the emergency department, currently IV fluids 75 cc/h Insulin drip initiated, anion gap closing and CO2 21.  Consider transition over to ICU  hyperglycemia protocol if both remain at goal Urine ketones pending  Hypernatremia, hypovolemic Hyperkalemia Free water added per tube, on 1/2NS No indication to treat hyperK at this time.  Follow BMP  Chronic dysphagia, chronic PEG Hold on tube feeding for now.  Freewater added  Anemia chronic disease Follow CBC  Septic shock, suspect due to PNA. WBC remains normal but procalcitonin markedly elevated, other clinical markers indicate infection Follow cx data Continue empiric vancomycin, Zosyn  Hyperosmolar nonketotic coma versus DKA "pre-Diabetes" per family and notes Hypothyroidism Synthroid 44 mcg IV Goal convert insulin drip over to subcutaneous, ICU protocol 0/53  Toxic metabolic encephalopathy Cerebral palsy with baseline cognitive deficits, spastic quadriplegia Seizure disorder Follow mental status for improvement, remains suppressed Continue current Keppra, Depakote, Vimpat IV   Disposition / Summary of Today's Plan 04-Jan-2018     Best Practice / Goals of Care / Disposition.   DVT prophylaxis: heparin sq GI prophylaxis: pepcid Diet: npo Mobility:bed rest Code Status: DNR Family Communication: Reviewed status and plans with the patient's grandmother and cousin at bedside.  Again we are hoping to stabilize with noninvasive measures, pressors, BiPAP.  They understand that if she does not improve, if evidence for worsening then transition to comfort would be appropriate.  DNR/I status confirmed  Labs   CBC: Recent Labs  Lab 12/07/2017 1448 01-04-2018 0219  WBC 5.4 5.0  NEUTROABS 1.7  --   HGB 11.3* 7.9*  HCT 41.0 26.5*  MCV 123.5* 116.7*  PLT 290 976*   Basic Metabolic Panel: Recent Labs  Lab 12/04/2017 1448 12/16/2017 1941 12/14/2017 2145 01/04/18 0219 04-Jan-2018 0700  NA 164* 169* 161* 160* 160*  K 3.8 2.2* 2.2* 5.1 5.2*  CL 119* >130* >130* 129* 128*  CO2 18* 22 17* 20* 21*  GLUCOSE 1,007* 744* 474* 299* 200*  BUN 74* 66* 52* 47* 45*  CREATININE 4.29*  4.19* 3.12* 2.69* 2.64*  CALCIUM 9.2 7.8* 7.2* 7.5* 7.6*  MG  --   --   --  2.0  --    GFR: Estimated Creatinine Clearance: 18.5 mL/min (A) (by C-G formula based on SCr of 2.64 mg/dL (H)). Recent Labs  Lab 12/16/2017 1448 12/04/2017 1502 11/29/2017 2145 01/04/2018 0219  PROCALCITON  --   --  63.74  --   WBC 5.4  --   --  5.0  LATICACIDVEN  --  15.36* 10.9* 8.3*   Liver Function Tests: Recent Labs  Lab 11/29/2017 1448  AST 33  ALT 12  ALKPHOS 112  BILITOT 0.4  PROT 8.1  ALBUMIN 2.5*   Recent Labs  Lab 12/22/2017 1448  LIPASE 76*   No results for input(s): AMMONIA in the last 168 hours. ABG    Component Value Date/Time   PHART 7.278 (L) 12/23/2017 1738   PCO2ART 44.5 12/09/2017 1738   PO2ART 402 (H) 12/07/2017 1738   HCO3 20.1 12/03/2017 1738   TCO2 26 02/23/2016 0022   ACIDBASEDEF 5.9 (H) 12/04/2017 1738   O2SAT 99.7 12/07/2017 1738    Coagulation Profile: No results for input(s): INR, PROTIME in the last 168 hours. Cardiac Enzymes: Recent Labs  Lab 12/24/17 2145 2018-01-04 0219  TROPONINI 0.40* 0.24*   HbA1C: Hgb A1c MFr Bld  Date/Time Value Ref Range Status  01/15/2017  05:52 AM 6.4 (H) 4.8 - 5.6 % Final    Comment:    (NOTE)         Prediabetes: 5.7 - 6.4         Diabetes: >6.4         Glycemic control for adults with diabetes: <7.0   01/01/2016 06:35 AM 5.5 4.8 - 5.6 % Final    Comment:    (NOTE)         Pre-diabetes: 5.7 - 6.4         Diabetes: >6.4         Glycemic control for adults with diabetes: <7.0    CBG: Recent Labs  Lab 01-13-2018 0322 01-13-2018 0435 01/13/2018 0540 Jan 13, 2018 0648 January 13, 2018 0802  GLUCAP 192* 154* 137* 119* 99     Review of Systems:    Past medical history  She,  has a past medical history of Allergic rhinitis (11/04/2012), Cerebral palsy ([place]), Cerebral palsy, quadriplegic ([place]), CHF (congestive heart failure) ([place]), COPD (chronic obstructive pulmonary disease) ([place]), Cortical blindness, Dilantin toxicity (August 2014),  Hypertension (06/23/2010), Mental retardation, Pneumonia (December 2014), Seizures [facility]), and Vision abnormalities.   Surgical History    Past Surgical History:  Procedure Laterality Date  . IR GENERIC HISTORICAL  06/30/2016   IR REPLC GASTRO/COLONIC TUBE PERCUT W/FLUORO 06/30/2016 Marybelle Killings, MD [facility]-INTERV RAD  . IR PATIENT EVAL TECH 0-60 MINS  10/06/2017  . IR US GUIDE VASC ACCESS LEFT  12/04/2017  . PEG PLACEMENT  09/03/2011   Procedure: PERCUTANEOUS ENDOSCOPIC GASTROSTOMY (PEG) REPLACEMENT;  Surgeon: Jerene Bears, MD;  Location: [facility];  Service: Gastroenterology;  Laterality: N/A;  . PEG PLACEMENT  02/19/2012   Procedure: PERCUTANEOUS ENDOSCOPIC GASTROSTOMY (PEG) REPLACEMENT;  Surgeon: Lafayette Dragon, MD;  Location: [facility];  Service: Endoscopy;  Laterality: N/A;  needs gastrostomy button 24 Fr 4.4 and 3.5 available  . PEG PLACEMENT N/A 05/30/2013   Procedure: PERCUTANEOUS ENDOSCOPIC GASTROSTOMY (PEG) REPLACEMENT;  Surgeon: Jerene Bears, MD;  Location: [facility];  Service: Gastroenterology;  Laterality: N/A;  . PEG PLACEMENT N/A 07/03/2013   Procedure: PERCUTANEOUS ENDOSCOPIC GASTROSTOMY (PEG) REPLACEMENT;  Surgeon: Lafayette Dragon, MD;  Location: [facility];  Service: Endoscopy;  Laterality: N/A;  Replace with 20 Fr x 3.5 cm mini button-  . PEG TUBE PLACEMENT       Social History   Social History   Socioeconomic History  . Marital status: Single    Spouse name: Not on file  . Number of children: Not on file  . Years of education: Not on file  . Highest education level: Not on file  Occupational History  . Not on file  Social Needs  . Financial resource strain: Not on file  . Food insecurity:    Worry: Not on file    Inability: Not on file  . Transportation needs:    Medical: Not on file    Non-medical: Not on file  Tobacco Use  . Smoking status: Never Smoker  . Smokeless tobacco: Never Used  Substance and Sexual Activity  . Alcohol use: No  . Drug use: No  .  Sexual activity: Never  Lifestyle  . Physical activity:    Days per week: Not on file    Minutes per session: Not on file  . Stress: Not on file  Relationships  . Social connections:    Talks on phone: Not on file    Gets together: Not on file    Attends religious service:  Not on file    Active member of club or organization: Not on file    Attends meetings of clubs or organizations: Not on file    Relationship status: Not on file  . Intimate partner violence:    Fear of current or ex partner: Not on file    Emotionally abused: Not on file    Physically abused: Not on file    Forced sexual activity: Not on file  Other Topics Concern  . Not on file  Social History Narrative   [name] attends [employer] and does well. She lives with her grandmother/guardian, [name], her cousin, and her great aunt. She enjoys exercising, standing and music.  ,  reports that she has never smoked. She has never used smokeless tobacco. She reports that she does not drink alcohol or use drugs.   Family history   Her family history includes Cancer in her maternal grandfather; Diabetes in her maternal aunt, maternal grandmother, and mother; Hypertension in her other; Stroke in her maternal grandmother.   Allergies Allergies  Allergen Reactions  . Doxycycline Rash and Other (See Comments)    REACTION: Blisters / swelling  . Other Other (See Comments)    Seasonal Allergies      Home meds  Prior to Admission medications   Medication Sig Start Date End Date Taking? Authorizing Provider  dextromethorphan (DELSYM) 30 MG/5ML liquid Take 30 mg by mouth as needed for cough.    [provider]  DIASTAT ACUDIAL 10 MG GEL Place 5 mg rectally once. 08/03/16 03/25/17  Rockwell Germany, NP  Incontinence Supplies MISC 1 Units by Does not apply route as needed (For voids or bowel movements). 04/03/16   McKeag, Marylynn Pearson, MD  levETIRAcetam (KEPPRA) 100 MG/ML solution GIVE 12.5 MLS IN THE MORNING AND 12.5  ML IN THE EVENING BY PEG TUB 11/18/17   Rockwell Germany, NP  levothyroxine (SYNTHROID, LEVOTHROID) 88 MCG tablet TAKE 1 TABLET (88 MCG TOTAL) PER TUBE DAILY. 07/12/17   Nuala Alpha, DO  loratadine (CLARITIN) 5 MG/5ML syrup Place 10 mLs (10 mg total) into feeding tube daily. Patient taking differently: Place 10 mg into feeding tube daily as needed (congestion).  07/02/15   Frazier Richards, MD  Nutritional Supplements (FEEDING SUPPLEMENT, JEVITY 1.2 CAL,) LIQD Place 574 mLs into feeding tube See admin instructions. Give 2 cans (574 mls) per tube at 40 ml/hr daily    [provider]  ranitidine (ZANTAC) 75 MG/5ML syrup TAKE 5 MLS (75 MG TOTAL) BY PER J TUBE ROUTE 2 (TWO) TIMES DAILY. 09/22/17   Nuala Alpha, DO  valproic acid (DEPAKENE) 250 MG/5ML SOLN solution GIVE 13 ML BY PEG TUBE EVERY 6 HOURS 11/18/17   Rockwell Germany, NP  VIMPAT 10 MG/ML oral solution TAKE 5MLS EVERY MORNING AND EVENING 12/17/17   Rockwell Germany, NP     Independent CC time 36 minutes  Baltazar Apo, MD, PhD 2018/01/20, 9:04 AM [facility] [phone] or if no answer [phone]

## 2017-12-26 NOTE — Progress Notes (Signed)
CPT held at this time. Patient BP low. Bedside RN at bedside working with patient. Will attempt at later time as tolerated by patient.

## 2017-12-26 NOTE — Progress Notes (Signed)
eLink Physician-Brief Progress Note Patient Name: Megan [name] DOB: 11-Aug-1980 MRN: [id number]   Date of Service  01/05/18  HPI/Events of Note  CXR concerning for RLL atelectasis liekly from mucus plug, patient on Bipap, weak cough, underlying spastic paraplegia  eICU Interventions  Mucomyst and hypertonic saline nebs, advised nurse to give break from bipap if tolerated and do NT suction. Chest PT.  Prognosis guarded. Decreased ivf to 27ml/hr     Intervention Category Major Interventions: Respiratory failure - evaluation and management  Sharia Reeve 01-05-18, 2:27 AM

## 2017-12-26 NOTE — Progress Notes (Signed)
Pts abdomen noted to be quite firm.  BS hypoactive but present.  Dr. Gladys Damme made aware.

## 2017-12-26 NOTE — Progress Notes (Signed)
Dr. Gladys Damme aware of all labs, critical.  Orders received to hold last two runs of potassium at this time.  Will recheck at 0600.  Pt continues on 100 % FIO2, Increased WOB, tachycardic.

## 2017-12-26 NOTE — Progress Notes (Signed)
Provided support to family post comfort care and when pt passed. Pt's cousin and mother were tearful but well composed. Pt's grandmother, was very strong and talked to pt and was assured she is with Jesus. Pt's grandmother presented amazing strength at patient's passing. They recalled that pt was not expected to live to see a year old and she made it until she was 53. Pt was grateful for care given. Chaplain provided support until family departed the unit. Alicia, MDiv   Jan 18, 2018 1400  Clinical Encounter Type  Visited With Family

## 2017-12-26 NOTE — Progress Notes (Signed)
Patient removed from BiPAP for comfort. Family at bedside. RT available as needed.

## 2017-12-26 NOTE — Progress Notes (Signed)
NTS attempted via both nares, unsuccessful. OTS completed for scant amount of white thick secretions. Patient tolerated well but does not tolerate being off BiPAP. Replaced on BiPAP at previous settings. Mucolytics administered per MD order.

## 2017-12-26 NOTE — Progress Notes (Signed)
CPT held. Patient transitioning to comfort care.

## 2017-12-26 NOTE — Progress Notes (Signed)
Pt vomitted dark brown/green fluid.  About 100 cc.  RN was at the bedside and immediately removed bipap and suctioned patient.  Dr. Gladys Damme notified and Peg tube to LIS.  300 cc out.  Pt tachypneic 40 bpm and spo2 60%.    Dr. Gladys Damme asked RN to call grandmother and family to come in.  Family called, updated and on their way in.

## 2017-12-26 NOTE — Progress Notes (Signed)
R and L LE interosseous Lines pulled, pressure applied, gauze dressing in place.  No further bleeding.

## 2017-12-26 NOTE — Progress Notes (Signed)
Pt family arrived at bedside. Morphine gtt started. 15 minutes later at 1400 BiPAP removed and all other IVs stopped. Family called RN to room at 1409. Pt asystole at 1411. 3 RNs auscultated for heart and lung sounds for 2 minutes each with no activity. Time of death pronounced at 1411. Byrum, MD notified.

## 2017-12-26 NOTE — Progress Notes (Signed)
Ice Applied and room cooled

## 2017-12-26 NOTE — Progress Notes (Signed)
I made a follow-up visit. Pt's grandmother and cousin were in the room bedside of pt. Her mother walked in at conclusion visit. They said they were ok. [place], [place]   January 04, 2018 1358  Clinical Encounter Type  Visited With Family

## 2017-12-26 NOTE — Progress Notes (Signed)
RT attempted NT suction x's 2 via both nares.  RT unable to pass catheter due to large tongue.  Pt placed back on BIPAP Post NTS, RT to monitor and assess as needed.

## 2017-12-26 DEATH — deceased

## 2017-12-27 LAB — CULTURE, BLOOD (ROUTINE X 2): Special Requests: ADEQUATE

## 2017-12-28 ENCOUNTER — Other Ambulatory Visit (HOSPITAL_COMMUNITY): Payer: Self-pay | Admitting: Diagnostic Radiology

## 2017-12-28 ENCOUNTER — Telehealth: Payer: Self-pay

## 2017-12-28 ENCOUNTER — Ambulatory Visit: Payer: Medicaid Other | Admitting: Family Medicine

## 2017-12-28 LAB — GLUCOSE, CAPILLARY: Glucose-Capillary: 599 mg/dL (ref 70–99)

## 2017-12-28 LAB — CULTURE, BLOOD (ROUTINE X 2)

## 2017-12-28 NOTE — Telephone Encounter (Signed)
On 12/28/17 I received a d/c from [place] (original) The d/c is for cremation.  The patient is a patient of Doctor Byrum.   The d/c will be taken to [facility] ICU for signature.  On 12/30/17 I received the d/c back from Doctor Byrum.   I got the d/c ready and called the funeral home to let them know the d/c is ready for pickup.

## 2018-01-12 DIAGNOSIS — E872 Acidosis, unspecified: Secondary | ICD-10-CM | POA: Diagnosis present

## 2018-01-12 DIAGNOSIS — G9341 Metabolic encephalopathy: Secondary | ICD-10-CM | POA: Diagnosis present

## 2018-01-13 DIAGNOSIS — R7881 Bacteremia: Secondary | ICD-10-CM | POA: Diagnosis present

## 2018-01-13 DIAGNOSIS — B9620 Unspecified Escherichia coli [E. coli] as the cause of diseases classified elsewhere: Secondary | ICD-10-CM | POA: Diagnosis present

## 2018-01-25 NOTE — Death Summary Note (Addendum)
DEATH SUMMARY   Patient Details  Name: Megan [name] MRN: [id number] DOB: 12-05-1980  Admission/Discharge Information   Admit Date:  01/23/18  Date of Death: Date of Death: 2018-01-24  Time of Death: Time of Death: Aug 20, 1409  Length of Stay: 1  Referring Physician: Nuala Alpha, DO   Reason(s) for Hospitalization  Acute respiratory failure and metabolic encephalopathy  Diagnoses  Preliminary cause of death:   Acute respiratory failure due to aspiration pneumonia  Secondary Diagnoses (including complications and co-morbidities):  Principal Problem:   Acute respiratory failure ([place]) Active Problems:   Infantile cerebral palsy ([place])   Hypothyroidism   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 year old female with cerebral palsy, spastic quadriparesis, hypertension, epilepsy, diabetes, dysphasia that necessitated PEG tube placement.  She was nonverbal, only intermittently responsive even at her baseline but usually awake and alert.  Family noticed that she was having more breathing difficulty and was becoming less interactive, responsive on 2023-01-24.  There is no witnessed seizure activity reported.  She was obtunded, hyperglycemic and hypotensive.  Volume resuscitation was initiated.  She was found to have a severe metabolic acidosis, lactic acidosis, evolving renal failure.  She required BiPAP support for evolving respiratory failure and increased work of breathing.  Given her known history of dysphasia there was significant concern for possible  aspiration pneumonia.  She was volume resuscitated and given empiric antibiotics for presumed contribution of sepsis to her shock state.  In the emergency department a right femoral central venous catheter was placed but this was malpositioned in the femoral artery and was therefore removed.  A second central venous catheter was placed in the left femoral artery by IR without complication.  She required pressors.  Given her work of breathing, overall status it was felt that she was developing impending respiratory failure.  Discussions were had with the patient's grandmother at bedside who was also her healthcare spokesperson.  Given her overall functional capacity, prognosis it was felt that invasive ventilation was not in her best interest and likely would not enhance her chances for meaningful survival.  Her CODE STATUS was indicated as DNR with all medical interventions including pressors available.  There was some improvement in her metabolic disarray with volume resuscitation and restoration of adequate perfusion pressures.  Unfortunately her mental status, respiratory status and hemodynamics did not improve significantly. Blood cultures subsequently grew out E. coli.  Decision was made to make a transition to comfort focused care on 01-25-23.  BiPAP was removed after a morphine infusion was initiated.  Patient expired peacefully with immediate family present at bedside on 2023-01-25.  Pertinent Labs and Studies  Significant Diagnostic Studies Dg Abd 1 View  Result Date: 01/24/18 CLINICAL DATA:  Firm abdomen.  History of cerebral palsy. EXAM: ABDOMEN - 1 VIEW COMPARISON:  10/05/2017 FINDINGS: Soft tissue contour bulge noted along the right upper to mid abdomen adjacent to the inferomedial liver. This may reflect a distended stomach. No dilation of the colon or small bowel to suggest obstruction or significant adynamic ileus. What is presumed to be a  gastrostomy tube projects in the central abdomen, possibly  below the stomach. Consider a dislodged gastrostomy tube in the proper clinical setting. Left femoral central venous line tip projects at the L5-S1 disc level. IMPRESSION: 1. No evidence of bowel obstruction. 2. Stomach may be distended reflected by a soft tissue contour bulge as detailed above. Question position of the gastrostomy tube. Electronically Signed   By: Lajean Manes M.D.   On: 01-14-2018 07:28   Ir US Guide Vasc Access Left  Result Date: 12/06/2017 INDICATION: Patient with cerebral palsy presents with hyperglycemia, hypotension and respiratory distress. Patient has intraosseous access but needs emergent central venous access. Interventional Radiology was called to the Emergency Department for emergent vascular access. EXAM: CENTRAL VENOUS ACCESS WITH ULTRASOUND GUIDANCE MEDICATIONS: None ANESTHESIA/SEDATION: None FLUOROSCOPY TIME:  None COMPLICATIONS: None immediate. PROCEDURE: Patient is nonverbal and consent was not obtained due to the emergent situation. Central venous access had already been attempted in the right groin. A catheter was in the right groin but it was thought to be arterial. Ultrasound demonstrated a patent left common femoral vein. The left groin was prepped with chlorhexidine and sterile field was created. Skin was anesthetized with lidocaine. A needle was directed into the left common femoral vein with real-time ultrasound guidance. Wire easily advanced into the central venous system. The tract was dilated and a triple-lumen catheter was advanced over the wire without complication. All lumens aspirated and flushed well. Catheter was sutured to skin. Dressing was placed over the catheter. FINDINGS: Patent left common femoral vein.  Image was saved for documentation. IMPRESSION: Placement of an emergent left femoral central venous catheter with ultrasound guidance. Electronically Signed   By: Markus Daft M.D.   On: 11/29/2017 17:55   Dg Chest Port 1 View  Result Date:  14-Jan-2018 CLINICAL DATA:  Acute respiratory failure. History of cerebral palsy. EXAM: PORTABLE CHEST 1 VIEW COMPARISON:  12/18/2017 FINDINGS: Opacity at the lung bases has increased. Hemidiaphragms are now obscured. Lung apices remain clear. Heart is normal in size. IMPRESSION: 1. Significant interval increase in lung base opacity from the previous day's exam. Suspect multifocal pneumonia. Electronically Signed   By: Lajean Manes M.D.   On: 2018-01-14 07:23   Dg Chest Portable 1 View  Result Date: 12/23/2017 CLINICAL DATA:  Dyspnea EXAM: PORTABLE CHEST 1 VIEW COMPARISON:  Chest radiograph 01/11/2017 FINDINGS: Persistent mild elevation of the right hemidiaphragm. There is right basilar atelectasis. No pleural effusion or pneumothorax. Slightly increased opacity in the left lower lobe may be scarring related to prior infection. Compared to 01/11/2017, aeration of the left lower lung is improved. IMPRESSION: Bibasilar atelectasis and left basilar scarring, likely related to prior infection. Electronically Signed   By: Ulyses Jarred M.D.   On: 12/17/2017 18:28   [place] Ekg Site Rite  Result Date: 12/24/2017 If Site Rite image not attached, placement could not be confirmed due to current cardiac rhythm.   Procedures/Operations    Collene Gobble 01/13/2018, 3:35 PM

## 2020-05-14 IMAGING — CR DG ABDOMEN 1V
1 series · 1 of 1 positions shown · non-contrast
Comparison: 06/30/2016

CLINICAL DATA: Feeding tube placement

EXAM:
ABDOMEN - 1 VIEW

[x abdomen supine]
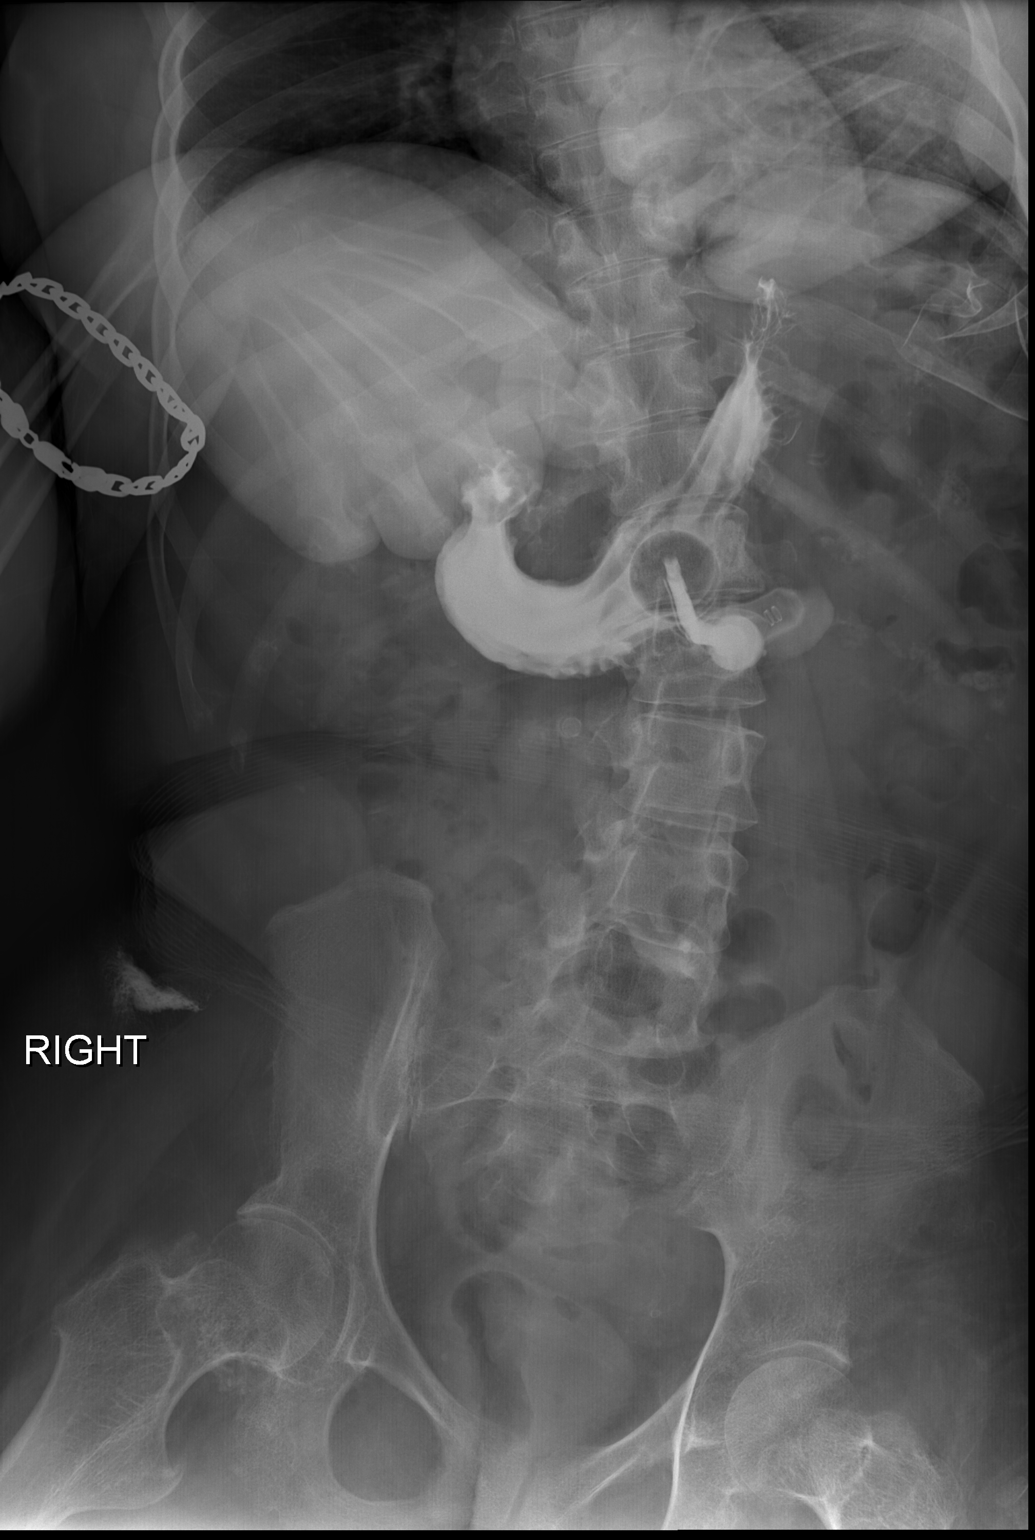

[1 of 1 positions shown; findings below may reference images not displayed]

FINDINGS: 30 mL of Omnipaque 300 was injected into the gastrostomy tube to
assess placement.

Contrast opacifies the gastric lumen.

Filling defect within gastric lumen from gastrostomy tube balloon.

No contrast extravasation identified.
IMPRESSION: Gastrostomy tube is located within the gastric lumen.
